# Patient Record
Sex: Male | Born: 1950
Health system: Southern US, Community
[De-identification: ages and names within clinical notes are randomized; demographics above are authoritative.]

## PROBLEM LIST (undated history)

## (undated) DIAGNOSIS — G4730 Sleep apnea, unspecified: Secondary | ICD-10-CM

## (undated) DIAGNOSIS — K602 Anal fissure, unspecified: Secondary | ICD-10-CM

## (undated) DIAGNOSIS — I714 Abdominal aortic aneurysm, without rupture, unspecified: Secondary | ICD-10-CM

## (undated) DIAGNOSIS — M1990 Unspecified osteoarthritis, unspecified site: Secondary | ICD-10-CM

## (undated) DIAGNOSIS — I712 Thoracic aortic aneurysm, without rupture: Secondary | ICD-10-CM

## (undated) DIAGNOSIS — G709 Myoneural disorder, unspecified: Secondary | ICD-10-CM

## (undated) DIAGNOSIS — I7121 Aneurysm of the ascending aorta, without rupture: Secondary | ICD-10-CM

## (undated) DIAGNOSIS — R51 Headache: Secondary | ICD-10-CM

## (undated) DIAGNOSIS — T7840XA Allergy, unspecified, initial encounter: Secondary | ICD-10-CM

## (undated) DIAGNOSIS — I719 Aortic aneurysm of unspecified site, without rupture: Secondary | ICD-10-CM

## (undated) DIAGNOSIS — I10 Essential (primary) hypertension: Secondary | ICD-10-CM

## (undated) DIAGNOSIS — E785 Hyperlipidemia, unspecified: Secondary | ICD-10-CM

## (undated) DIAGNOSIS — S129XXA Fracture of neck, unspecified, initial encounter: Secondary | ICD-10-CM

## (undated) DIAGNOSIS — K649 Unspecified hemorrhoids: Secondary | ICD-10-CM

## (undated) DIAGNOSIS — R55 Syncope and collapse: Secondary | ICD-10-CM

## (undated) DIAGNOSIS — I219 Acute myocardial infarction, unspecified: Secondary | ICD-10-CM

## (undated) DIAGNOSIS — I255 Ischemic cardiomyopathy: Secondary | ICD-10-CM

## (undated) DIAGNOSIS — M549 Dorsalgia, unspecified: Secondary | ICD-10-CM

## (undated) DIAGNOSIS — D126 Benign neoplasm of colon, unspecified: Secondary | ICD-10-CM

## (undated) DIAGNOSIS — G4733 Obstructive sleep apnea (adult) (pediatric): Secondary | ICD-10-CM

## (undated) DIAGNOSIS — I2510 Atherosclerotic heart disease of native coronary artery without angina pectoris: Secondary | ICD-10-CM

## (undated) DIAGNOSIS — I209 Angina pectoris, unspecified: Secondary | ICD-10-CM

## (undated) DIAGNOSIS — Z9289 Personal history of other medical treatment: Secondary | ICD-10-CM

## (undated) DIAGNOSIS — I739 Peripheral vascular disease, unspecified: Secondary | ICD-10-CM

## (undated) DIAGNOSIS — G4700 Insomnia, unspecified: Secondary | ICD-10-CM

## (undated) DIAGNOSIS — K219 Gastro-esophageal reflux disease without esophagitis: Secondary | ICD-10-CM

## (undated) HISTORY — PX: OTHER SURGICAL HISTORY: SHX169

## (undated) HISTORY — DX: Benign neoplasm of colon, unspecified: D12.6

## (undated) HISTORY — DX: Anal fissure, unspecified: K60.2

## (undated) HISTORY — PX: FRACTURE SURGERY: SHX138

## (undated) HISTORY — PX: CERVICAL LAMINECTOMY: SHX94

## (undated) HISTORY — DX: Hyperlipidemia, unspecified: E78.5

## (undated) HISTORY — PX: HIATAL HERNIA REPAIR: SHX195

## (undated) HISTORY — DX: Syncope and collapse: R55

## (undated) HISTORY — DX: Aneurysm of the ascending aorta, without rupture: I71.21

## (undated) HISTORY — DX: Atherosclerotic heart disease of native coronary artery without angina pectoris: I25.10

## (undated) HISTORY — PX: POLYPECTOMY: SHX149

## (undated) HISTORY — PX: BACK SURGERY: SHX140

## (undated) HISTORY — DX: Unspecified systolic (congestive) heart failure: I50.20

## (undated) HISTORY — DX: Obstructive sleep apnea (adult) (pediatric): G47.33

## (undated) HISTORY — PX: COLONOSCOPY: SHX174

## (undated) HISTORY — DX: Peripheral vascular disease, unspecified: I73.9

## (undated) HISTORY — DX: Personal history of other medical treatment: Z92.89

## (undated) HISTORY — DX: Abdominal aortic aneurysm, without rupture, unspecified: I71.40

## (undated) HISTORY — DX: Essential (primary) hypertension: I10

## (undated) HISTORY — DX: Allergy, unspecified, initial encounter: T78.40XA

## (undated) HISTORY — DX: Fracture of neck, unspecified, initial encounter: S12.9XXA

## (undated) HISTORY — PX: CORONARY ARTERY BYPASS GRAFT: SHX141

## (undated) HISTORY — DX: Thoracic aortic aneurysm, without rupture: I71.2

## (undated) HISTORY — DX: Unspecified hemorrhoids: K64.9

## (undated) HISTORY — DX: Ischemic cardiomyopathy: I25.5

## (undated) HISTORY — DX: Gastro-esophageal reflux disease without esophagitis: K21.9

## (undated) HISTORY — DX: Abdominal aortic aneurysm, without rupture: I71.4

---

## 2000-01-06 ENCOUNTER — Ambulatory Visit (HOSPITAL_COMMUNITY): Admission: RE | Admit: 2000-01-06 | Discharge: 2000-01-06 | Payer: Self-pay | Admitting: Neurosurgery

## 2000-01-06 ENCOUNTER — Encounter: Payer: Self-pay | Admitting: Neurosurgery

## 2000-01-07 ENCOUNTER — Encounter: Payer: Self-pay | Admitting: Neurosurgery

## 2000-01-07 ENCOUNTER — Ambulatory Visit (HOSPITAL_COMMUNITY): Admission: RE | Admit: 2000-01-07 | Discharge: 2000-01-07 | Payer: Self-pay | Admitting: Neurosurgery

## 2000-02-06 ENCOUNTER — Encounter: Payer: Self-pay | Admitting: Neurosurgery

## 2000-02-10 ENCOUNTER — Observation Stay (HOSPITAL_COMMUNITY): Admission: RE | Admit: 2000-02-10 | Discharge: 2000-02-10 | Payer: Self-pay | Admitting: Neurosurgery

## 2000-02-10 ENCOUNTER — Encounter: Payer: Self-pay | Admitting: Neurosurgery

## 2000-04-01 ENCOUNTER — Encounter: Payer: Self-pay | Admitting: Neurosurgery

## 2000-04-01 ENCOUNTER — Ambulatory Visit (HOSPITAL_COMMUNITY): Admission: RE | Admit: 2000-04-01 | Discharge: 2000-04-01 | Payer: Self-pay | Admitting: Neurosurgery

## 2000-12-14 DIAGNOSIS — I219 Acute myocardial infarction, unspecified: Secondary | ICD-10-CM

## 2000-12-14 HISTORY — DX: Acute myocardial infarction, unspecified: I21.9

## 2001-03-21 ENCOUNTER — Encounter (INDEPENDENT_AMBULATORY_CARE_PROVIDER_SITE_OTHER): Payer: Self-pay

## 2001-03-21 ENCOUNTER — Encounter: Payer: Self-pay | Admitting: Internal Medicine

## 2001-03-21 ENCOUNTER — Other Ambulatory Visit: Admission: RE | Admit: 2001-03-21 | Discharge: 2001-03-21 | Payer: Self-pay | Admitting: Internal Medicine

## 2001-03-28 ENCOUNTER — Encounter: Payer: Self-pay | Admitting: Internal Medicine

## 2001-03-28 ENCOUNTER — Ambulatory Visit (HOSPITAL_COMMUNITY): Admission: RE | Admit: 2001-03-28 | Discharge: 2001-03-28 | Payer: Self-pay | Admitting: Internal Medicine

## 2001-04-18 ENCOUNTER — Encounter (INDEPENDENT_AMBULATORY_CARE_PROVIDER_SITE_OTHER): Payer: Self-pay | Admitting: *Deleted

## 2001-04-18 ENCOUNTER — Encounter: Payer: Self-pay | Admitting: Surgery

## 2001-04-18 ENCOUNTER — Ambulatory Visit (HOSPITAL_COMMUNITY): Admission: RE | Admit: 2001-04-18 | Discharge: 2001-04-18 | Payer: Self-pay | Admitting: Surgery

## 2001-04-22 ENCOUNTER — Encounter (INDEPENDENT_AMBULATORY_CARE_PROVIDER_SITE_OTHER): Payer: Self-pay | Admitting: *Deleted

## 2001-04-22 ENCOUNTER — Inpatient Hospital Stay (HOSPITAL_COMMUNITY): Admission: RE | Admit: 2001-04-22 | Discharge: 2001-04-24 | Payer: Self-pay | Admitting: Surgery

## 2001-05-12 ENCOUNTER — Encounter: Payer: Self-pay | Admitting: Neurosurgery

## 2001-05-12 ENCOUNTER — Encounter: Admission: RE | Admit: 2001-05-12 | Discharge: 2001-05-12 | Payer: Self-pay | Admitting: Neurosurgery

## 2001-05-25 ENCOUNTER — Encounter: Payer: Self-pay | Admitting: Neurosurgery

## 2001-05-25 ENCOUNTER — Encounter: Admission: RE | Admit: 2001-05-25 | Discharge: 2001-05-25 | Payer: Self-pay | Admitting: Neurosurgery

## 2001-09-17 ENCOUNTER — Encounter: Payer: Self-pay | Admitting: Emergency Medicine

## 2001-09-17 ENCOUNTER — Inpatient Hospital Stay (HOSPITAL_COMMUNITY): Admission: EM | Admit: 2001-09-17 | Discharge: 2001-09-24 | Payer: Self-pay | Admitting: Emergency Medicine

## 2001-09-18 ENCOUNTER — Encounter: Payer: Self-pay | Admitting: Cardiothoracic Surgery

## 2001-09-18 ENCOUNTER — Encounter: Payer: Self-pay | Admitting: Cardiology

## 2001-09-19 ENCOUNTER — Encounter: Payer: Self-pay | Admitting: Cardiothoracic Surgery

## 2001-09-20 ENCOUNTER — Encounter: Payer: Self-pay | Admitting: Cardiothoracic Surgery

## 2001-09-21 ENCOUNTER — Encounter: Payer: Self-pay | Admitting: Cardiothoracic Surgery

## 2001-09-25 ENCOUNTER — Observation Stay (HOSPITAL_COMMUNITY): Admission: EM | Admit: 2001-09-25 | Discharge: 2001-09-27 | Payer: Self-pay | Admitting: Emergency Medicine

## 2001-09-25 ENCOUNTER — Encounter: Payer: Self-pay | Admitting: Emergency Medicine

## 2001-09-27 ENCOUNTER — Encounter: Payer: Self-pay | Admitting: Cardiovascular Disease

## 2001-11-01 ENCOUNTER — Encounter (HOSPITAL_COMMUNITY): Admission: RE | Admit: 2001-11-01 | Discharge: 2002-01-30 | Payer: Self-pay | Admitting: Cardiology

## 2001-12-14 HISTORY — PX: OTHER SURGICAL HISTORY: SHX169

## 2003-03-24 ENCOUNTER — Emergency Department (HOSPITAL_COMMUNITY): Admission: EM | Admit: 2003-03-24 | Discharge: 2003-03-25 | Payer: Self-pay | Admitting: Emergency Medicine

## 2005-03-09 ENCOUNTER — Ambulatory Visit: Payer: Self-pay | Admitting: Family Medicine

## 2005-03-16 ENCOUNTER — Ambulatory Visit: Payer: Self-pay | Admitting: Family Medicine

## 2005-10-09 ENCOUNTER — Ambulatory Visit: Payer: Self-pay | Admitting: Family Medicine

## 2006-01-18 ENCOUNTER — Ambulatory Visit: Payer: Self-pay | Admitting: Family Medicine

## 2006-02-08 ENCOUNTER — Ambulatory Visit: Payer: Self-pay | Admitting: Family Medicine

## 2006-02-14 ENCOUNTER — Encounter: Admission: RE | Admit: 2006-02-14 | Discharge: 2006-02-14 | Payer: Self-pay | Admitting: Family Medicine

## 2006-03-31 ENCOUNTER — Ambulatory Visit: Payer: Self-pay | Admitting: Cardiology

## 2006-04-01 ENCOUNTER — Ambulatory Visit: Payer: Self-pay | Admitting: Cardiology

## 2006-04-06 ENCOUNTER — Ambulatory Visit (HOSPITAL_COMMUNITY): Admission: RE | Admit: 2006-04-06 | Discharge: 2006-04-07 | Payer: Self-pay | Admitting: Neurosurgery

## 2006-06-09 ENCOUNTER — Encounter: Admission: RE | Admit: 2006-06-09 | Discharge: 2006-06-09 | Payer: Self-pay | Admitting: Neurosurgery

## 2006-06-25 ENCOUNTER — Emergency Department (HOSPITAL_COMMUNITY): Admission: EM | Admit: 2006-06-25 | Discharge: 2006-06-25 | Payer: Self-pay | Admitting: Emergency Medicine

## 2006-09-24 ENCOUNTER — Ambulatory Visit: Payer: Self-pay | Admitting: Cardiology

## 2006-09-28 ENCOUNTER — Ambulatory Visit: Payer: Self-pay

## 2006-09-30 ENCOUNTER — Ambulatory Visit: Payer: Self-pay | Admitting: Internal Medicine

## 2006-10-05 ENCOUNTER — Ambulatory Visit: Payer: Self-pay | Admitting: Cardiology

## 2006-10-08 ENCOUNTER — Inpatient Hospital Stay (HOSPITAL_BASED_OUTPATIENT_CLINIC_OR_DEPARTMENT_OTHER): Admission: RE | Admit: 2006-10-08 | Discharge: 2006-10-08 | Payer: Self-pay | Admitting: Cardiovascular Disease

## 2006-10-08 ENCOUNTER — Ambulatory Visit: Payer: Self-pay | Admitting: Cardiovascular Disease

## 2006-11-03 ENCOUNTER — Ambulatory Visit: Payer: Self-pay | Admitting: Cardiology

## 2006-11-15 ENCOUNTER — Ambulatory Visit: Payer: Self-pay | Admitting: Cardiology

## 2006-11-22 ENCOUNTER — Ambulatory Visit: Payer: Self-pay | Admitting: Family Medicine

## 2006-11-29 ENCOUNTER — Ambulatory Visit: Payer: Self-pay

## 2007-01-21 ENCOUNTER — Ambulatory Visit: Payer: Self-pay | Admitting: Family Medicine

## 2007-02-15 ENCOUNTER — Ambulatory Visit: Payer: Self-pay | Admitting: Cardiology

## 2007-02-18 ENCOUNTER — Ambulatory Visit: Payer: Self-pay | Admitting: Family Medicine

## 2007-02-18 LAB — CONVERTED CEMR LAB
BUN: 15 mg/dL (ref 6–23)
Basophils Absolute: 0 10*3/uL (ref 0.0–0.1)
Basophils Relative: 0.6 % (ref 0.0–1.0)
CO2: 29 meq/L (ref 19–32)
Calcium: 9.5 mg/dL (ref 8.4–10.5)
Chloride: 101 meq/L (ref 96–112)
Creatinine, Ser: 0.7 mg/dL (ref 0.4–1.5)
Eosinophils Absolute: 0.1 10*3/uL (ref 0.0–0.6)
Eosinophils Relative: 2 % (ref 0.0–5.0)
GFR calc Af Amer: 151 mL/min
GFR calc non Af Amer: 124 mL/min
Glucose, Bld: 104 mg/dL — ABNORMAL HIGH (ref 70–99)
HCT: 42.7 % (ref 39.0–52.0)
Hemoglobin: 14.9 g/dL (ref 13.0–17.0)
Lymphocytes Relative: 36.1 % (ref 12.0–46.0)
MCHC: 34.9 g/dL (ref 30.0–36.0)
MCV: 93 fL (ref 78.0–100.0)
Monocytes Absolute: 0.3 10*3/uL (ref 0.2–0.7)
Monocytes Relative: 8.4 % (ref 3.0–11.0)
Neutro Abs: 2.1 10*3/uL (ref 1.4–7.7)
Neutrophils Relative %: 52.9 % (ref 43.0–77.0)
Platelets: 185 10*3/uL (ref 150–400)
Potassium: 4.2 meq/L (ref 3.5–5.1)
RBC: 4.59 M/uL (ref 4.22–5.81)
RDW: 12.1 % (ref 11.5–14.6)
Sodium: 136 meq/L (ref 135–145)
WBC: 3.9 10*3/uL — ABNORMAL LOW (ref 4.5–10.5)

## 2007-02-28 ENCOUNTER — Encounter: Payer: Self-pay | Admitting: Internal Medicine

## 2007-06-13 ENCOUNTER — Ambulatory Visit: Payer: Self-pay | Admitting: Cardiology

## 2007-07-04 ENCOUNTER — Encounter: Payer: Self-pay | Admitting: Family Medicine

## 2007-07-14 ENCOUNTER — Telehealth (INDEPENDENT_AMBULATORY_CARE_PROVIDER_SITE_OTHER): Payer: Self-pay | Admitting: *Deleted

## 2007-07-14 ENCOUNTER — Encounter (INDEPENDENT_AMBULATORY_CARE_PROVIDER_SITE_OTHER): Payer: Self-pay | Admitting: *Deleted

## 2007-07-14 LAB — CONVERTED CEMR LAB
Bilirubin Urine: NEGATIVE
Blood in Urine, dipstick: NEGATIVE
Glucose, Urine, Semiquant: NEGATIVE
Ketones, urine, test strip: NEGATIVE
Nitrite: NEGATIVE
Protein, U semiquant: NEGATIVE
Urobilinogen, UA: NEGATIVE
WBC Urine, dipstick: NEGATIVE

## 2007-07-15 ENCOUNTER — Ambulatory Visit: Payer: Self-pay | Admitting: Family Medicine

## 2007-07-15 DIAGNOSIS — H60399 Other infective otitis externa, unspecified ear: Secondary | ICD-10-CM | POA: Insufficient documentation

## 2007-08-01 ENCOUNTER — Ambulatory Visit: Payer: Self-pay | Admitting: Family Medicine

## 2007-08-01 ENCOUNTER — Encounter: Admission: RE | Admit: 2007-08-01 | Discharge: 2007-08-01 | Payer: Self-pay | Admitting: Family Medicine

## 2007-08-01 DIAGNOSIS — R55 Syncope and collapse: Secondary | ICD-10-CM | POA: Insufficient documentation

## 2007-08-01 DIAGNOSIS — E785 Hyperlipidemia, unspecified: Secondary | ICD-10-CM | POA: Insufficient documentation

## 2007-08-01 DIAGNOSIS — M542 Cervicalgia: Secondary | ICD-10-CM | POA: Insufficient documentation

## 2007-08-05 ENCOUNTER — Encounter: Payer: Self-pay | Admitting: Family Medicine

## 2007-08-05 ENCOUNTER — Ambulatory Visit: Payer: Self-pay

## 2007-08-09 ENCOUNTER — Encounter (INDEPENDENT_AMBULATORY_CARE_PROVIDER_SITE_OTHER): Payer: Self-pay | Admitting: *Deleted

## 2007-08-17 ENCOUNTER — Ambulatory Visit: Payer: Self-pay | Admitting: Family Medicine

## 2007-08-17 DIAGNOSIS — I10 Essential (primary) hypertension: Secondary | ICD-10-CM | POA: Insufficient documentation

## 2007-08-17 DIAGNOSIS — G4700 Insomnia, unspecified: Secondary | ICD-10-CM | POA: Insufficient documentation

## 2007-09-07 ENCOUNTER — Telehealth: Payer: Self-pay | Admitting: Family Medicine

## 2007-09-26 ENCOUNTER — Ambulatory Visit (HOSPITAL_COMMUNITY): Admission: RE | Admit: 2007-09-26 | Discharge: 2007-09-26 | Payer: Self-pay | Admitting: Urology

## 2007-12-16 ENCOUNTER — Ambulatory Visit: Payer: Self-pay | Admitting: Cardiology

## 2007-12-23 ENCOUNTER — Ambulatory Visit: Payer: Self-pay

## 2007-12-23 ENCOUNTER — Ambulatory Visit: Payer: Self-pay | Admitting: Cardiology

## 2007-12-23 LAB — CONVERTED CEMR LAB
ALT: 28 units/L (ref 0–53)
AST: 26 units/L (ref 0–37)
Albumin: 4.2 g/dL (ref 3.5–5.2)
Alkaline Phosphatase: 55 units/L (ref 39–117)
Bilirubin, Direct: 0.1 mg/dL (ref 0.0–0.3)
Cholesterol: 155 mg/dL (ref 0–200)
HDL: 35.4 mg/dL — ABNORMAL LOW (ref >39.0)
LDL Cholesterol: 96 mg/dL (ref 0–99)
Total Bilirubin: 1 mg/dL (ref 0.3–1.2)
Total CHOL/HDL Ratio: 4.4
Total Protein: 7.1 g/dL (ref 6.0–8.3)
Triglycerides: 117 mg/dL (ref 0–149)
VLDL: 23 mg/dL (ref 0–40)

## 2008-04-19 ENCOUNTER — Telehealth (INDEPENDENT_AMBULATORY_CARE_PROVIDER_SITE_OTHER): Payer: Self-pay | Admitting: *Deleted

## 2008-04-24 ENCOUNTER — Encounter: Payer: Self-pay | Admitting: Family Medicine

## 2008-05-08 ENCOUNTER — Ambulatory Visit: Payer: Self-pay | Admitting: Cardiology

## 2008-05-10 ENCOUNTER — Ambulatory Visit: Payer: Self-pay | Admitting: Internal Medicine

## 2008-05-10 ENCOUNTER — Ambulatory Visit: Payer: Self-pay

## 2008-05-10 ENCOUNTER — Encounter: Payer: Self-pay | Admitting: Cardiology

## 2008-05-10 ENCOUNTER — Ambulatory Visit: Payer: Self-pay | Admitting: Cardiology

## 2008-05-14 ENCOUNTER — Encounter: Payer: Self-pay | Admitting: Family Medicine

## 2008-05-14 ENCOUNTER — Ambulatory Visit: Payer: Self-pay

## 2008-08-07 ENCOUNTER — Ambulatory Visit: Payer: Self-pay | Admitting: Cardiology

## 2008-08-07 LAB — CONVERTED CEMR LAB
BUN: 12 mg/dL (ref 6–23)
CO2: 29 meq/L (ref 19–32)
Calcium: 9.2 mg/dL (ref 8.4–10.5)
Chloride: 107 meq/L (ref 96–112)
Creatinine, Ser: 0.9 mg/dL (ref 0.4–1.5)
GFR calc Af Amer: 112 mL/min
GFR calc non Af Amer: 93 mL/min
Glucose, Bld: 101 mg/dL — ABNORMAL HIGH (ref 70–99)
Potassium: 4.1 meq/L (ref 3.5–5.1)
Sodium: 140 meq/L (ref 135–145)

## 2008-08-29 ENCOUNTER — Ambulatory Visit: Payer: Self-pay | Admitting: Family Medicine

## 2008-08-29 ENCOUNTER — Encounter (INDEPENDENT_AMBULATORY_CARE_PROVIDER_SITE_OTHER): Payer: Self-pay | Admitting: *Deleted

## 2008-08-29 DIAGNOSIS — R079 Chest pain, unspecified: Secondary | ICD-10-CM | POA: Insufficient documentation

## 2008-08-29 LAB — CONVERTED CEMR LAB
Bilirubin Urine: NEGATIVE
Glucose, Urine, Semiquant: NEGATIVE
Ketones, urine, test strip: NEGATIVE
Nitrite: NEGATIVE
Protein, U semiquant: NEGATIVE
Specific Gravity, Urine: <1.005
Urobilinogen, UA: 0.2
WBC Urine, dipstick: NEGATIVE
pH: 7

## 2008-09-18 ENCOUNTER — Telehealth (INDEPENDENT_AMBULATORY_CARE_PROVIDER_SITE_OTHER): Payer: Self-pay | Admitting: *Deleted

## 2009-02-04 ENCOUNTER — Ambulatory Visit: Payer: Self-pay | Admitting: Cardiology

## 2009-02-11 ENCOUNTER — Ambulatory Visit: Payer: Self-pay | Admitting: Family Medicine

## 2009-02-19 ENCOUNTER — Telehealth (INDEPENDENT_AMBULATORY_CARE_PROVIDER_SITE_OTHER): Payer: Self-pay | Admitting: *Deleted

## 2009-02-21 ENCOUNTER — Encounter: Payer: Self-pay | Admitting: Family Medicine

## 2009-02-21 ENCOUNTER — Telehealth (INDEPENDENT_AMBULATORY_CARE_PROVIDER_SITE_OTHER): Payer: Self-pay | Admitting: *Deleted

## 2009-03-21 LAB — CONVERTED CEMR LAB
ALT: 22 units/L (ref 0–53)
AST: 20 units/L (ref 0–37)
Albumin: 4.2 g/dL (ref 3.5–5.2)
Alkaline Phosphatase: 56 units/L (ref 39–117)
BUN: 14 mg/dL (ref 6–23)
Bilirubin, Direct: 0.1 mg/dL (ref 0.0–0.3)
CO2: 31 meq/L (ref 19–32)
Calcium: 9.4 mg/dL (ref 8.4–10.5)
Chloride: 104 meq/L (ref 96–112)
Cholesterol: 198 mg/dL (ref 0–200)
Creatinine, Ser: 0.9 mg/dL (ref 0.4–1.5)
GFR calc Af Amer: 112 mL/min
GFR calc non Af Amer: 92 mL/min
Glucose, Bld: 98 mg/dL (ref 70–99)
HDL: 38.7 mg/dL — ABNORMAL LOW (ref >39.0)
LDL Cholesterol: 123 mg/dL — ABNORMAL HIGH (ref 0–99)
Potassium: 4.4 meq/L (ref 3.5–5.1)
Sodium: 141 meq/L (ref 135–145)
Total Bilirubin: 1 mg/dL (ref 0.3–1.2)
Total CHOL/HDL Ratio: 5.1
Total Protein: 6.9 g/dL (ref 6.0–8.3)
Triglycerides: 180 mg/dL — ABNORMAL HIGH (ref 0–149)
VLDL: 36 mg/dL (ref 0–40)

## 2009-04-08 ENCOUNTER — Ambulatory Visit: Payer: Self-pay | Admitting: Family Medicine

## 2009-04-08 DIAGNOSIS — Z87898 Personal history of other specified conditions: Secondary | ICD-10-CM | POA: Insufficient documentation

## 2009-04-10 ENCOUNTER — Encounter: Payer: Self-pay | Admitting: Family Medicine

## 2009-04-11 ENCOUNTER — Encounter: Payer: Self-pay | Admitting: Family Medicine

## 2009-04-23 ENCOUNTER — Encounter: Admission: RE | Admit: 2009-04-23 | Discharge: 2009-04-23 | Payer: Self-pay | Admitting: Neurosurgery

## 2009-07-08 ENCOUNTER — Encounter: Payer: Self-pay | Admitting: Family Medicine

## 2009-08-07 ENCOUNTER — Encounter: Payer: Self-pay | Admitting: Family Medicine

## 2009-12-27 ENCOUNTER — Encounter: Payer: Self-pay | Admitting: Cardiology

## 2009-12-27 DIAGNOSIS — I714 Abdominal aortic aneurysm, without rupture, unspecified: Secondary | ICD-10-CM

## 2009-12-27 HISTORY — DX: Abdominal aortic aneurysm, without rupture, unspecified: I71.40

## 2009-12-30 ENCOUNTER — Encounter: Payer: Self-pay | Admitting: Cardiology

## 2009-12-30 ENCOUNTER — Ambulatory Visit: Payer: Self-pay

## 2010-01-07 ENCOUNTER — Emergency Department (HOSPITAL_BASED_OUTPATIENT_CLINIC_OR_DEPARTMENT_OTHER): Admission: EM | Admit: 2010-01-07 | Discharge: 2010-01-07 | Payer: Self-pay | Admitting: Emergency Medicine

## 2010-01-23 ENCOUNTER — Ambulatory Visit: Payer: Self-pay | Admitting: Family Medicine

## 2010-01-23 DIAGNOSIS — R197 Diarrhea, unspecified: Secondary | ICD-10-CM | POA: Insufficient documentation

## 2010-01-23 DIAGNOSIS — R509 Fever, unspecified: Secondary | ICD-10-CM | POA: Insufficient documentation

## 2010-01-27 LAB — CONVERTED CEMR LAB
ALT: 22 units/L (ref 0–53)
AST: 24 units/L (ref 0–37)
Albumin: 4.4 g/dL (ref 3.5–5.2)
Alkaline Phosphatase: 55 units/L (ref 39–117)
BUN: 13 mg/dL (ref 6–23)
Basophils Absolute: 0 10*3/uL (ref 0.0–0.1)
Basophils Relative: 0.7 % (ref 0.0–3.0)
Bilirubin, Direct: 0.1 mg/dL (ref 0.0–0.3)
CO2: 25 meq/L (ref 19–32)
Calcium: 9.3 mg/dL (ref 8.4–10.5)
Chloride: 108 meq/L (ref 96–112)
Creatinine, Ser: 0.9 mg/dL (ref 0.4–1.5)
Eosinophils Absolute: 0.1 10*3/uL (ref 0.0–0.7)
Eosinophils Relative: 2.3 % (ref 0.0–5.0)
GFR calc non Af Amer: 92.13 mL/min (ref >60)
Glucose, Bld: 94 mg/dL (ref 70–99)
HCT: 39.4 % (ref 39.0–52.0)
Hemoglobin: 13.8 g/dL (ref 13.0–17.0)
Lymphocytes Relative: 27.5 % (ref 12.0–46.0)
Lymphs Abs: 1.2 10*3/uL (ref 0.7–4.0)
MCHC: 34.9 g/dL (ref 30.0–36.0)
MCV: 93.9 fL (ref 78.0–100.0)
Monocytes Absolute: 0.3 10*3/uL (ref 0.1–1.0)
Monocytes Relative: 6.7 % (ref 3.0–12.0)
Neutro Abs: 2.7 10*3/uL (ref 1.4–7.7)
Neutrophils Relative %: 62.8 % (ref 43.0–77.0)
Platelets: 157 10*3/uL (ref 150.0–400.0)
Potassium: 4.2 meq/L (ref 3.5–5.1)
RBC: 4.2 M/uL — ABNORMAL LOW (ref 4.22–5.81)
RDW: 12.2 % (ref 11.5–14.6)
Sodium: 139 meq/L (ref 135–145)
TSH: 1.06 microintl units/mL (ref 0.35–5.50)
Total Bilirubin: 0.6 mg/dL (ref 0.3–1.2)
Total Protein: 6.9 g/dL (ref 6.0–8.3)
WBC: 4.3 10*3/uL — ABNORMAL LOW (ref 4.5–10.5)

## 2010-02-11 ENCOUNTER — Encounter: Payer: Self-pay | Admitting: Family Medicine

## 2010-02-12 ENCOUNTER — Telehealth: Payer: Self-pay | Admitting: Internal Medicine

## 2010-02-12 ENCOUNTER — Telehealth (INDEPENDENT_AMBULATORY_CARE_PROVIDER_SITE_OTHER): Payer: Self-pay | Admitting: *Deleted

## 2010-02-14 ENCOUNTER — Ambulatory Visit: Payer: Self-pay | Admitting: Gastroenterology

## 2010-02-14 DIAGNOSIS — Z860100 Personal history of colon polyps, unspecified: Secondary | ICD-10-CM | POA: Insufficient documentation

## 2010-02-14 DIAGNOSIS — R197 Diarrhea, unspecified: Secondary | ICD-10-CM | POA: Insufficient documentation

## 2010-03-05 DIAGNOSIS — I2510 Atherosclerotic heart disease of native coronary artery without angina pectoris: Secondary | ICD-10-CM | POA: Insufficient documentation

## 2010-03-05 DIAGNOSIS — K219 Gastro-esophageal reflux disease without esophagitis: Secondary | ICD-10-CM | POA: Insufficient documentation

## 2010-03-10 ENCOUNTER — Ambulatory Visit: Payer: Self-pay | Admitting: Cardiology

## 2010-03-18 ENCOUNTER — Telehealth: Payer: Self-pay | Admitting: Internal Medicine

## 2010-03-18 ENCOUNTER — Telehealth (INDEPENDENT_AMBULATORY_CARE_PROVIDER_SITE_OTHER): Payer: Self-pay | Admitting: *Deleted

## 2010-03-24 ENCOUNTER — Encounter: Payer: Self-pay | Admitting: Family Medicine

## 2010-04-09 ENCOUNTER — Ambulatory Visit: Payer: Self-pay | Admitting: Internal Medicine

## 2010-04-22 ENCOUNTER — Telehealth: Payer: Self-pay | Admitting: Internal Medicine

## 2010-04-23 ENCOUNTER — Encounter: Payer: Self-pay | Admitting: Nurse Practitioner

## 2010-04-23 ENCOUNTER — Ambulatory Visit: Payer: Self-pay | Admitting: Gastroenterology

## 2010-04-23 DIAGNOSIS — R259 Unspecified abnormal involuntary movements: Secondary | ICD-10-CM | POA: Insufficient documentation

## 2010-04-23 LAB — CONVERTED CEMR LAB
IgA: 249 mg/dL (ref 68–378)
Tissue Transglutaminase Ab, IgA: 3.1 units (ref <20)

## 2010-04-24 ENCOUNTER — Telehealth (INDEPENDENT_AMBULATORY_CARE_PROVIDER_SITE_OTHER): Payer: Self-pay | Admitting: *Deleted

## 2010-04-24 ENCOUNTER — Encounter: Payer: Self-pay | Admitting: Family Medicine

## 2010-05-02 ENCOUNTER — Telehealth: Payer: Self-pay | Admitting: Nurse Practitioner

## 2010-05-19 ENCOUNTER — Ambulatory Visit: Payer: Self-pay | Admitting: Cardiology

## 2010-05-20 ENCOUNTER — Telehealth: Payer: Self-pay | Admitting: Cardiology

## 2010-05-23 ENCOUNTER — Encounter: Payer: Self-pay | Admitting: Cardiology

## 2010-06-04 ENCOUNTER — Encounter: Payer: Self-pay | Admitting: Family Medicine

## 2010-06-05 ENCOUNTER — Ambulatory Visit: Payer: Self-pay | Admitting: Internal Medicine

## 2010-06-05 ENCOUNTER — Inpatient Hospital Stay (HOSPITAL_COMMUNITY): Admission: EM | Admit: 2010-06-05 | Discharge: 2010-06-06 | Payer: Self-pay | Admitting: Emergency Medicine

## 2010-06-05 HISTORY — PX: CARDIAC CATHETERIZATION: SHX172

## 2010-06-09 ENCOUNTER — Encounter: Payer: Self-pay | Admitting: Cardiology

## 2010-06-26 ENCOUNTER — Ambulatory Visit: Payer: Self-pay | Admitting: Family Medicine

## 2010-06-26 DIAGNOSIS — F411 Generalized anxiety disorder: Secondary | ICD-10-CM | POA: Insufficient documentation

## 2010-07-07 ENCOUNTER — Ambulatory Visit: Payer: Self-pay | Admitting: Cardiology

## 2010-07-29 ENCOUNTER — Encounter: Payer: Self-pay | Admitting: Internal Medicine

## 2010-09-03 ENCOUNTER — Telehealth: Payer: Self-pay | Admitting: Cardiology

## 2010-09-10 ENCOUNTER — Ambulatory Visit: Payer: Self-pay | Admitting: Cardiology

## 2010-09-18 ENCOUNTER — Telehealth: Payer: Self-pay | Admitting: Cardiology

## 2010-09-18 ENCOUNTER — Telehealth: Payer: Self-pay | Admitting: Family Medicine

## 2010-09-19 ENCOUNTER — Ambulatory Visit: Payer: Self-pay | Admitting: Family Medicine

## 2010-09-19 ENCOUNTER — Encounter: Payer: Self-pay | Admitting: Family Medicine

## 2010-11-05 ENCOUNTER — Encounter: Payer: Self-pay | Admitting: Family Medicine

## 2010-11-10 ENCOUNTER — Encounter: Payer: Self-pay | Admitting: Family Medicine

## 2010-12-19 ENCOUNTER — Ambulatory Visit
Admission: RE | Admit: 2010-12-19 | Discharge: 2010-12-19 | Payer: Self-pay | Source: Home / Self Care | Attending: Family Medicine | Admitting: Family Medicine

## 2010-12-19 ENCOUNTER — Encounter: Payer: Self-pay | Admitting: Family Medicine

## 2011-01-11 LAB — CONVERTED CEMR LAB
BUN: 17 mg/dL (ref 6–23)
CO2: 30 meq/L (ref 19–32)
Calcium: 8.9 mg/dL (ref 8.4–10.5)
Chloride: 104 meq/L (ref 96–112)
Cholesterol: 155 mg/dL (ref 0–200)
Cholesterol: 206 mg/dL — ABNORMAL HIGH (ref 0–200)
Creatinine, Ser: 0.9 mg/dL (ref 0.4–1.5)
Direct LDL: 109.6 mg/dL
Direct LDL: 78.6 mg/dL
GFR calc non Af Amer: 94.44 mL/min (ref >60)
Glucose, Bld: 108 mg/dL — ABNORMAL HIGH (ref 70–99)
HDL: 35.1 mg/dL — ABNORMAL LOW (ref >39.00)
HDL: 42.6 mg/dL (ref >39.00)
Potassium: 4.3 meq/L (ref 3.5–5.1)
Sodium: 140 meq/L (ref 135–145)
Total CHOL/HDL Ratio: 4
Total CHOL/HDL Ratio: 5
Triglycerides: 291 mg/dL — ABNORMAL HIGH (ref 0.0–149.0)
Triglycerides: 291 mg/dL — ABNORMAL HIGH (ref 0.0–149.0)
VLDL: 58.2 mg/dL — ABNORMAL HIGH (ref 0.0–40.0)
VLDL: 58.2 mg/dL — ABNORMAL HIGH (ref 0.0–40.0)

## 2011-01-15 NOTE — Miscellaneous (Signed)
Summary: Orders Update---celebrex RX  Medications Added CELEBREX 200 MG  CAPS (CELECOXIB) 1 by mouth once daily       Clinical Lists Changes  Medications: Added new medication of CELEBREX 200 MG  CAPS (CELECOXIB) 1 by mouth once daily - Signed Rx of CELEBREX 200 MG  CAPS (CELECOXIB) 1 by mouth once daily;  #90 x 3;  Signed;  Entered by: Loreen Freud DO;  Authorized by: Loreen Freud DO;  Method used: Print then Give to Patient    Prescriptions: CELEBREX 200 MG  CAPS (CELECOXIB) 1 by mouth once daily  #90 x 3   Entered and Authorized by:   Loreen Freud DO   Signed by:   Loreen Freud DO on 07/04/2007   Method used:   Print then Give to Patient   RxID:   [id number]

## 2011-01-15 NOTE — Letter (Signed)
Summary: [facility]   Imported By: Lanelle Bal 03/31/2010 11:14:09  _____________________________________________________________________  External Attachment:    Type:   Image     Comment:   External Document

## 2011-01-15 NOTE — Letter (Signed)
Summary: [facility]  [facility]   Imported By: Lanelle Bal 07/15/2009 11:45:58  _____________________________________________________________________  External Attachment:    Type:   Image     Comment:   External Document

## 2011-01-15 NOTE — Letter (Signed)
Summary: Results Follow-up Letter  [facility] [place]  [address] [place], [place] [address]   Phone: [phone]  Fax: [phone]    08/29/2008       Jemari Hallum [address] [place], [place]  [address]  Dear Mr. WIDMAYER,   The following are the results of your recent test(s):  Test     Result     Pap Smear    Normal_______  Not Normal_____       Comments: _________________________________________________________ Cholesterol LDL(Bad cholesterol):          Your goal is less than:         HDL (Good cholesterol):        Your goal is more than: _________________________________________________________ Other Tests:   _________________________________________________________  Please call for an appointment Or Please see attached labs._________________________________________________________ _________________________________________________________ _________________________________________________________  Sincerely,  Felecia Deloach CMA [facility]

## 2011-01-15 NOTE — Letter (Signed)
Summary: Work Dietitian at [facility]  [address] [place], [place] [address]   Phone: [phone]  Fax: [phone]    Today's Date: January 23, 2010  Name of Patient: [name]  The above named patient had a medical visit today at:  11:15 am   Please take this into consideration when reviewing the time away from work/school.    Special Instructions:  [ X ] None  [  ] To be off the remainder of today, returning to the normal work / school schedule tomorrow.  [  ] To be off until the next scheduled appointment on ______________________.  [  ] Other ________________________________________________________________ ________________________________________________________________________   Sincerely yours,   Loreen Freud DO  Appended Document: Work Excuse THis note needs to be voided

## 2011-01-15 NOTE — Progress Notes (Signed)
Summary: Xifaxan NOT WORKING   Phone Note Outgoing Call   Call placed to: Patient Summary of Call: LM on home phone , asked per per Gunnar Fusi if the Xifaxan helped him. Initial call taken by: Joselyn Glassman,  May 02, 2010 10:56 AM  Follow-up for Phone Call        The pt said he is still having about 4 BM's of diarrhea daily. He takes Lomotil 1-2 times daily.  He said if the goal of the Xifaxan was to stop the diarrhea it failed.   Follow-up by: Joselyn Glassman,  May 02, 2010 11:15 AM  Additional Follow-up for Phone Call Additional follow up Details #1::        RETREAT WITH MORE PROLONGED COURSE OF METRONIDAZOLE 250MG  QID X ONE MONTH. KEEP F/U W/ ME ON 6-27 Additional Follow-up by: Hilarie Fredrickson MD,  May 02, 2010 12:29 PM    New/Updated Medications: METRONIDAZOLE 250 MG TABS (METRONIDAZOLE) Take 1 tab 4 times daily x 1 month LEVBID 0.375 MG XR12H-TAB (HYOSCYAMINE SULFATE) Take 1 tab twice daily as needed for cramping, spasms Prescriptions: LEVBID 0.375 MG XR12H-TAB (HYOSCYAMINE SULFATE) Take 1 tab twice daily as needed for cramping, spasms  #60 x 0   Entered by:   Lowry Ram NCMA   Authorized by:   Willette Cluster NP   Signed by:   Lowry Ram NCMA on 05/02/2010   Method used:   [facility] to        [facility]  [address] [phone]       [address]       [place], [place]  [address]       Ph: [phone] or [phone]       Fax: [phone]   RxID:   [id number] METRONIDAZOLE 250 MG TABS (METRONIDAZOLE) Take 1 tab 4 times daily x 1 month  #120 x 0   Entered by:   Lowry Ram NCMA   Authorized by:   Hilarie Fredrickson MD   Signed by:   Lowry Ram NCMA on 05/02/2010   Method used:   [facility]        [facility]  [address] [phone][facility] (retail[facility]       [address]       [place], [place]  [address]       Ph: [phone] or [phone]       Fax: [phone]   RxID:   [id number]   Appended Document: Xifaxan NOT  WORKING Spoke to Dr. Marina Goodell and he said the pt can stop the Xifaxan and start to take the Metronidazole 250 mg 1 tab 4 times daily x 1 month.  I did speak to Mrs. Serviss and told her I spoke to Dr. Marina Goodell to let him know that the pt said he does feel the Xifaxan may have helped some.  Dr. Marina Goodell still thinks he should start the Metronidazole. He doesn't feel he needs a probiotic at this time.   I spoke to Bressler and she said I could send a perscription for Levbid 0.375 mg , take twice daily. The pt asked me for this. He said this helped the last time he was on Metronidazole.

## 2011-01-15 NOTE — Progress Notes (Signed)
Summary: [facility]ne---rx CELEBREX 200 MG  CAPS   Phone Note Refill Request   Refills Requested: Medication #1:  CELEBREX 200 MG  CAPS 1 by mouth once daily [facility]--  Initial call taken by: Freddy Jaksch,  February 19, 2009 8:47 AM  Follow-up for Phone Call        last filled 04-17-08 #90 3 refills, last Ov 08-29-08....................Marland KitchenFelecia Deloach CMA  February 19, 2009 3:22 PM  Additional Follow-up for Phone Call Additional follow up Details #1::        rx sent to [facility] Additional Follow-up by: Loreen Freud DO,  February 19, 2009 9:57 PM      Prescriptions: CELEBREX 200 MG  CAPS (CELECOXIB) 1 by mouth once daily  #90 x 3   Entered and Authorized by:   Loreen Freud DO   Signed by:   Loreen Freud DO on 02/19/2009   Method used:   Electronically to        [facility] (mail-order)             ,          Ph: [phone]       Fax: [phone]   RxID:   [id number]

## 2011-01-15 NOTE — Assessment & Plan Note (Signed)
Summary: f68m   Visit Type:  Follow-up Referring Provider:  n/a Primary Provider:  Loreen Freud, DO  CC:  no complaints.  History of Present Illness: He is doing well.  Not having any angina.  Has been off of Celbrex.  Doing well overall.  BP went down after Toprol was increased with the hospital admission.  Current Medications (verified): 1)  Valtrex 500 Mg Tabs (Valacyclovir Hcl) .... Take 1 Tablet By Mouth Twice A Day As Needed 2)  Tramadol Hcl 50 Mg Tabs (Tramadol Hcl) .... Take As Needed Pain 3)  Altace 5 Mg  Caps (Ramipril) .Marland Kitchen.. 1 By Mouth Two Times A Day 4)  Clonazepam 1 Mg  Tabs (Clonazepam) .Marland Kitchen.. 1 1/2 Tab Once Daily 5)  Adult Aspirin Low Strength 81 Mg  Tbdp (Aspirin) .Marland Kitchen.. 1 By Mouth Once Daily 6)  Toprol Xl 25 Mg Xr24h-Tab (Metoprolol Succinate) .Marland Kitchen.. 1 Tab Two Times A Day 7)  Zanaflex 4 Mg Caps (Tizanidine Hcl) .... As Needed 8)  Pravachol 80 Mg  Tabs (Pravastatin Sodium) .Marland Kitchen.. 1 By Mouth At Bedtime 9)  Avodart 0.5 Mg  Caps (Dutasteride) .Marland Kitchen.. 1 By Mouth Once Daily 10)  Vicodin Es 7.5-750 Mg Tabs (Hydrocodone-Acetaminophen) .Marland Kitchen.. 1 By Mouth Every 6 Hours As Needed 11)  Remeron 30 Mg Tabs (Mirtazapine) .... As Needed 12)  Eql Coq10 300 Mg Caps (Coenzyme Q10) .... Once A Day 13)  Tumeric 500mg  Cap .... Once A Day 14)  Omega-3 Krill Oil 300 Mg Caps (Krill Oil) .... 0nce A Day 15)  Nitrostat 0.4 Mg Subl (Nitroglycerin) .Marland Kitchen.. 1 Tablet Under Tongue At Onset of Chest Pain; You May Repeat Every 5 Minutes For Up To 3 Doses.  Allergies (verified): 1)  ! Pcn 2)  ! 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) DIARRHEA-PRESUMED INFECTIOUS (ICD-009.3) GERD (ICD-530.81) PERSONAL HX COLONIC POLYPS (ICD-V12.72) FEVER UNSPECIFIED (ICD-780.60) DIARRHEA (ICD-787.91) SHINGLES, HX OF  (ICD-V13.8) PREVENTIVE HEALTH CARE (ICD-V70.0) ENCOUNTER FOR LONG-TERM USE OF OTHER MEDICATIONS (ICD-V58.69) RIB PAIN, LEFT SIDED (ICD-786.50) INSOMNIA, PERSISTENT (ICD-307.42) NECK PAIN (ICD-723.1) OTITIS EXTERNA, RIGHT (ICD-380.10)  Past Surgical History: Last updated: 06/26/2010 Cervical laminectomy Hiatal Hernia surgery Heart bypass x 6 cardiac cath 06/05/2010  Family History: Last updated: 03/05/2010 Alcohilism-Mother No FH of Colon Cancer:  Social History: Last updated: 03/05/2010 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  Alcohol Use - yes  Daily Caffeine Use Illicit Drug Use - no  Risk Factors: Alcohol Use: 2 (06/26/2010) >5 drinks/d w/in last 3 months: no (06/26/2010) Caffeine Use: 2-3 (06/26/2010) Diet: well balanced (06/26/2010) Exercise: yes (06/26/2010)  Risk Factors: Smoking Status: quit (06/26/2010)  Vital Signs:  Patient profile:   60 year old male Height:      76.5 inches Weight:      193 pounds BMI:     23.27 Pulse rate:   62 / minute BP sitting:   132 / 90  (left arm) Cuff size:   regular  Vitals Entered By: Hardin Negus, RMA (September 10, 2010 9:10 AM)  Physical Exam  General:  Well developed, well nourished, in no acute distress. Head:  normocephalic and atraumatic Eyes:  PERRLA/EOM intact; conjunctiva and lids normal. Neck:  No bruits Lungs:  Clear bilaterally to auscultation and percussion. Heart:  PMI non displaced.  Normal S1 and S2.  S4  No signficant murmur.   Pulses:  pulses normal in all 4 extremities Extremities:  No clubbing or cyanosis. Neurologic:  Alert and oriented x 3.   Impression & Recommendations:  Problem # 1:  CAD, ARTERY BYPASS GRAFT (ICD-414.04) No chest pain.  Doing well.  His updated medication list for this problem includes:    Altace 5 Mg Caps (Ramipril) .Marland Kitchen... 1 by mouth two times a day    Adult Aspirin Low Strength 81 Mg Tbdp (Aspirin) .Marland Kitchen... 1 by mouth once daily     Toprol Xl 25 Mg Xr24h-tab (Metoprolol succinate) .Marland Kitchen... 1 tab two times a day    Nitrostat 0.4 Mg Subl (Nitroglycerin) .Marland Kitchen... 1 tablet under tongue at onset of chest pain; you may repeat every 5 minutes for up to 3 doses.  Problem # 2:  HYPERLIPIDEMIA (ICD-272.4) Tolerating well.   His updated medication list for this problem includes:    Pravachol 80 Mg Tabs (Pravastatin sodium) .Marland Kitchen... 1 by mouth at bedtime  Problem # 3:  HYPERTENSION (ICD-401.9) controlled at present His updated medication list for this problem includes:    Altace 5 Mg Caps (Ramipril) .Marland Kitchen... 1 by mouth two times a day    Adult Aspirin Low Strength 81 Mg Tbdp (Aspirin) .Marland Kitchen... 1 by mouth once daily    Toprol Xl 25 Mg Xr24h-tab (Metoprolol succinate) .Marland Kitchen... 1 tab two times a day  Patient Instructions: 1)  Your physician recommends that you continue on your current medications as directed. Please refer to the Current Medication list given to you today. 2)  Your physician wants you to follow-up in: 6 MONTHS.   You will receive a reminder letter in the mail two months in advance. If you don't receive a letter, please call our office to schedule the follow-up appointment.

## 2011-01-15 NOTE — Progress Notes (Signed)
Summary: Triage   Phone Note Call from Patient Call back at Home Phone [phone]   Caller: Wife  Liborio Nixon Call For: Dr. Marina Goodell Reason for Call: Talk to Nurse, Privacy/Consent Authorization Summary of Call: Pt has had diarrhea x4 mths. "It cleared up some" when he was taken Flagyl. Has an appt. sch'd on 06-09-10 and wants to know what he can do in the meantime Initial call taken by: Karna Christmas,  Apr 22, 2010 1:47 PM  Follow-up for Phone Call        When pt. saw Dr.Madeliene Tejera on 04/09/10 he had just finished  a course of Flagyl and stool was soft and formed.Within a few days he was back to loose stools and he has about 3 a day that are mostly green in color.Does not see any blood or mucus and denies fever.Only other complaint is urgency prior to the stool.Says he never had a problem with diarrhea until he got the noro-virus in Jan. of this year.Sent to Dr.Patterson  the dr. of the day as Dr.Deontre Allsup is out of the office this week. Follow-up by: Teryl Lucy RN,  Apr 22, 2010 2:14 PM  Additional Follow-up for Phone Call Additional follow up Details #1::        see pa.. Additional Follow-up by: Mardella Layman MD FACG,  Apr 22, 2010 2:23 PM    Additional Follow-up for Phone Call Additional follow up Details #2::    Given appt. with N.P. for tomorrow at 8:30. a.m..Pt. aware. Follow-up by: Teryl Lucy RN,  Apr 22, 2010 2:50 PM

## 2011-01-15 NOTE — Assessment & Plan Note (Signed)
Summary: eph   Visit Type:  EPH Referring Provider:  n/a Primary Provider:  Loreen Freud, DO  CC:  chest pain for the fisrt couple of days after pt got out of [facility]...no other complaints today.  History of Present Illness: Films reviewed today.  At present, grafts remain open.  Films reviewed in detail from both 2007 and 2011.  Shown to patient and comparison as well.  Could not tolerate imdur. Doing well since discharge from the hospital.  Long discussion today.  Current Medications (verified): 1)  Celebrex 200 Mg  Caps (Celecoxib) .Marland Kitchen.. 1 By Mouth Once Daily 2)  Valtrex 500 Mg Tabs (Valacyclovir Hcl) .... Take 1 Tablet By Mouth Twice A Day As Needed 3)  Tramadol Hcl 50 Mg Tabs (Tramadol Hcl) .... Take As Needed Pain 4)  Altace 5 Mg  Caps (Ramipril) .Marland Kitchen.. 1 By Mouth Two Times A Day 5)  Clonazepam 1 Mg  Tabs (Clonazepam) .Marland Kitchen.. 1 1/2 Tab Once Daily 6)  Adult Aspirin Low Strength 81 Mg  Tbdp (Aspirin) .Marland Kitchen.. 1 By Mouth Once Daily 7)  Toprol Xl 25 Mg Xr24h-Tab (Metoprolol Succinate) .Marland Kitchen.. 1 Tab Two Times A Day 8)  Zanaflex 4 Mg Caps (Tizanidine Hcl) .... As Needed 9)  Pravachol 80 Mg  Tabs (Pravastatin Sodium) .Marland Kitchen.. 1 By Mouth At Bedtime 10)  Avodart 0.5 Mg  Caps (Dutasteride) .Marland Kitchen.. 1 By Mouth Once Daily 11)  Vicodin Es 7.5-750 Mg Tabs (Hydrocodone-Acetaminophen) .Marland Kitchen.. 1 By Mouth Every 6 Hours As Needed 12)  Remeron 30 Mg Tabs (Mirtazapine) .... As Needed 13)  Eql Coq10 300 Mg Caps (Coenzyme Q10) .... Once A Day 14)  Tumeric 500mg  Cap .... Once A Day 15)  Omega-3 Krill Oil 300 Mg Caps (Krill Oil) .... 0nce A Day  Allergies: 1)  ! Pcn 2)  ! 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) DIARRHEA-PRESUMED INFECTIOUS (ICD-009.3) GERD (ICD-530.81) PERSONAL HX COLONIC POLYPS (ICD-V12.72) FEVER  UNSPECIFIED (ICD-780.60) DIARRHEA (ICD-787.91) SHINGLES, HX OF (ICD-V13.8) PREVENTIVE HEALTH CARE (ICD-V70.0) ENCOUNTER FOR LONG-TERM USE OF OTHER MEDICATIONS (ICD-V58.69) RIB PAIN, LEFT SIDED (ICD-786.50) INSOMNIA, PERSISTENT (ICD-307.42) NECK PAIN (ICD-723.1) OTITIS EXTERNA, RIGHT (ICD-380.10)  Vital Signs:  Patient profile:   60 year old male Height:      76.5 inches Weight:      195 pounds BMI:     23.51 Pulse rate:   71 / minute Pulse rhythm:   irregular BP sitting:   110 / 80  (left arm) Cuff size:   large  Vitals Entered By: Danielle Rankin, CMA (July 07, 2010 3:05 PM)  Physical Exam  General:  Well developed, well nourished, in no acute distress. Head:  normocephalic and atraumatic Eyes:  PERRLA/EOM intact; conjunctiva and lids normal. Lungs:  Clear bilaterally to auscultation and percussion. Heart:  NOrmal S1 and S2.  No definite murmur.  Abdomen:  Bowel sounds positive; abdomen soft and non-tender without masses, organomegaly, or hernias noted. No hepatosplenomegaly.  No palpable masses. Pulses:  pulses normal in all 4 extremities Extremities:  No clubbing or cyanosis. Neurologic:  Alert and oriented x 3.   Cardiac Cath  Procedure date:  06/06/2010  Findings:      1. Severe three-vessel coronary artery disease as described above     which is unchanged. 2. 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 3. The saphenous vein sequential graft to the first obtuse  marginal     artery and second obtuse marginal artery has some mild plaquing in     it.  The first limb is chronically occluded.  There is a 50% lesion     in the proximal part of the graft to moderate diffuse disease     throughout the middle of the graft. 4. Saphenous vein Y-graft to the right coronary artery and posterior     descending artery is patent.  The posterior descending artery is     tiny and has dye hang-up in the graft which is chronic.  Saphenous     vein graft to the ramus is  patent. 5. Left ventricular functions with an ejection fraction of 45-50%.   PLAN/DISCUSSION:  Overall, he has no significant change in his catheterization since 2007 except perhaps maybe a slight decrease in his LV function which was previously 60%.  There was no culprit lesion to explain his chest pain.  Recommendations will be for continued medical therapy.  EKG  Procedure date:  07/07/2010  Findings:      NSR.  Inferior MI, old.  Nonspecific ST and T wave changes.  Impression & Recommendations:  Problem # 1:  CAD, ARTERY BYPASS GRAFT (ICD-414.04)  Seems to be doing well.  Could not tolerate imdur, and had some pain just after leaving, but none recently. NOw with no symptoms. His updated medication list for this problem includes:    Altace 5 Mg Caps (Ramipril) .Marland Kitchen... 1 by mouth two times a day    Adult Aspirin Low Strength 81 Mg Tbdp (Aspirin) .Marland Kitchen... 1 by mouth once daily    Toprol Xl 25 Mg Xr24h-tab (Metoprolol succinate) .Marland Kitchen... 1 tab two times a day    Nitrostat 0.4 Mg Subl (Nitroglycerin) .Marland Kitchen... 1 tablet under tongue at onset of chest pain; you may repeat every 5 minutes for up to 3 doses.  Orders: EKG w/ Interpretation ([address])  Problem # 2:  HYPERLIPIDEMIA (ICD-272.4)  Continue current regimen. His updated medication list for this problem includes:    Pravachol 80 Mg Tabs (Pravastatin sodium) .Marland Kitchen... 1 by mouth at bedtime  Orders: EKG w/ Interpretation ([address])  Problem # 3:  HYPERTENSION (ICD-401.9) controlled. His updated medication list for this problem includes:    Altace 5 Mg Caps (Ramipril) .Marland Kitchen... 1 by mouth two times a day    Adult Aspirin Low Strength 81 Mg Tbdp (Aspirin) .Marland Kitchen... 1 by mouth once daily    Toprol Xl 25 Mg Xr24h-tab (Metoprolol succinate) .Marland Kitchen... 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  Most recent 2.8 by 2.8  Patient Instructions: 1)  Your physician recommends that you schedule a follow-up  appointment in: 2 MONTHS.  2)  Your physician recommends that you continue on your current medications as directed. Please refer to the Current Medication list given to you today. Prescriptions: NITROSTAT 0.4 MG SUBL (NITROGLYCERIN) 1 tablet under tongue at onset of chest pain; you may repeat every 5 minutes for up to 3 doses.  #25 x 1   Entered by:   Julieta Gutting, RN, BSN   Authorized by:   Ronaldo Miyamoto, MD, [facility]   Signed by:   Julieta Gutting, RN, BSN on 07/07/2010   Method used:   [facility]  [address] [phone][facility]ail[facility]       [address]       [place]  [place], [place]  [address]       Ph: [phone] or [phone]       Fax: [phone]   RxID:   [id number] TOPROL XL 25 MG XR24H-TAB (METOPROLOL SUCCINATE) 1 tab two times a day  #180 x 3   Entered by:   Danielle Rankin, CMA   Authorized by:   Ronaldo Miyamoto, MD, [facility]   Signed by:   Danielle Rankin, CMA on 07/07/2010   Method used:   Faxed to ...       [facility] (mail-order)             , [place]         Ph:        Fax: [phone]   RxID:   [id number]

## 2011-01-15 NOTE — Progress Notes (Signed)
Summary: re labwork  Phone Note Call from Patient   Caller: Patient Reason for Call: Talk to Nurse Summary of Call: pt calling to see if he needs labwork at next visit? [phone] Initial call taken by: Glynda Jaeger,  September 03, 2010 2:56 PM  Follow-up for Phone Call        Left message on identified voicemail that the pt does not require fasting bloodwork prior to his appt.  Lipids were just checked in June. Follow-up by: Julieta Gutting, RN, BSN,  September 03, 2010 3:17 PM

## 2011-01-15 NOTE — Progress Notes (Signed)
Summary: COL?   Phone Note Call from Patient Call back at Home Phone [phone]   Caller: wife, [name] Call For: Dr. Marina Goodell Reason for Call: Talk to Nurse Summary of Call: wife thouht pt was supposed to be having a COL soon as a f/u yo previous tests and diagnosis Initial call taken by: Vallarie Mare,  March 18, 2010 1:33 PM  Follow-up for Phone Call        Pt. ntfd. that polyps were hyperplastic so colon recall is 2013. Follow-up by: Teryl Lucy RN,  March 18, 2010 1:48 PM

## 2011-01-15 NOTE — Assessment & Plan Note (Signed)
Summary: cpx /cbs   Vital Signs:  Patient profile:   60 year old male Height:      76.25 inches Weight:      191 pounds BMI:     23.18 BSA:     2.18 Pulse rate:   86 / minute Pulse rhythm:   regular Resp:     17 per minute BP sitting:   140 / 80  (left arm) Cuff size:   regular  Vitals Entered By: Ardyth Man (April 08, 2009 8:33 AM) Comments CPX.  Pt sees Dr. [name] for cardio and had labs 02/11/09. Pt has ongoing back pain (x 1 week) and suspects he has shingles.  Pt requests med refill for Valtrex and Zanaflex.     History of Present Illness: Pt here for CPE . Pt c/o rash on R side that he thinks is shingles---He started taking his Valtrex 500 two times a day on Friday.   Pt seeing Dr [name] regularly and labs were just done and Urology checks prostate/ PSA.  Preventive Screening-Counseling & Management     Alcohol drinks/day: 2     Alcohol type: wine     >5/day in last 3 mos: no     Smoking Status: quit > 6 months     Year Quit: 1997     Caffeine use/day: 2-3     Caffeine Counseling: decrease use of caffeine     Diet Comments: well balanced     Does Patient Exercise: yes     Type of exercise: walking     Exercise (avg: min/session): <30     Times/week: 5     Dental Visit-last 6 months yes     Dental Care Counseling: not indicated; dental care within six months     TSE monthly: yes     Testicular SE Education/Counseling not indicated; STE done regularly     Sun Exposure-Excessive: no     Sun Exposure Counseling: not indicated; sun exposure is acceptable     Seat Belt Use: yes     Seat Belt Counseling: not indicated; patient wears seat belts  Problems Prior to Update: 1)  Shingles, Hx of  (ICD-V13.8) 2)  Preventive Health Care  (ICD-V70.0) 3)  Encounter For Long-term Use of Other Medications  (ICD-V58.69) 4)  Coronary Atherosclerosis Native Coronary Artery  (ICD-414.01) 5)  Rib Pain, Left Sided  (ICD-786.50) 6)  Insomnia, 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 1 By Mouth Once Daily 2)  Valtrex 500 Mg Tabs (Valacyclovir Hcl) .... Take 1 Tablet By Mouth Twice A Day As Needed 3)  Ambien Cr 12.5 Mg Tbcr (Zolpidem Tartrate) .... Take 1 Tablet By Mouth Every Night 4)  Tramadol Hcl 50 Mg Tabs (Tramadol Hcl) 5)  Altace 5 Mg  Caps (Ramipril) .Marland Kitchen.. 1 By Mouth Two Times A Day 6)  Clonazepam 1 Mg  Tabs (Clonazepam) .Marland Kitchen.. 1 By Mouth 4 Days Every Week 7)  Adult Aspirin Low Strength 81 Mg  Tbdp (Aspirin) .Marland Kitchen.. 1 By Mouth Once Daily 8)  Toprol Xl 25 Mg  Tb24 (Metoprolol Succinate) .Marland Kitchen.. 1 By Mouth Once Daily 9)  Zanaflex 2 Mg  Caps (Tizanidine Hcl) .Marland Kitchen.. 1 By Mouth As Needed 10)  Pravachol 80 Mg  Tabs (Pravastatin  Sodium) .Marland Kitchen.. 1 By Mouth At Bedtime 11)  Avodart 0.5 Mg  Caps (Dutasteride) .Marland Kitchen.. 1 By Mouth Once Daily 12)  Rozerem 8 Mg  Tabs (Ramelteon) .... Take One Tablet At Bedtime 13)  Vicodin Es 7.5-750 Mg Tabs (Hydrocodone-Acetaminophen) .Marland Kitchen.. 1 By Mouth Every 6 Hours As Needed  Current Medications (verified): 1)  Celebrex 200 Mg  Caps (Celecoxib) .Marland Kitchen.. 1 By Mouth Once Daily 2)  Valtrex 500 Mg Tabs (Valacyclovir Hcl) .... Take 1 Tablet By Mouth Twice A Day As Needed 3)  Ambien Cr 12.5 Mg Tbcr (Zolpidem Tartrate) .... Take 1 Tablet By Mouth Every Night 4)  Tramadol Hcl 50 Mg Tabs (Tramadol Hcl) 5)  Altace 5 Mg  Caps (Ramipril) .Marland Kitchen.. 1 By Mouth Two Times A Day 6)  Clonazepam 1 Mg  Tabs (Clonazepam) .Marland Kitchen.. 1 By Mouth 4 Days Every Week 7)  Adult Aspirin Low Strength 81 Mg  Tbdp (Aspirin) .Marland Kitchen.. 1 By Mouth Once Daily 8)  Toprol Xl 25 Mg  Tb24 (Metoprolol Succinate) .Marland Kitchen.. 1 By Mouth Once Daily 9)  Zanaflex 2 Mg  Caps (Tizanidine Hcl) .Marland Kitchen.. 1 By Mouth As Needed 10)  Pravachol 80 Mg  Tabs (Pravastatin Sodium) .Marland Kitchen.. 1 By Mouth At  Bedtime 11)  Avodart 0.5 Mg  Caps (Dutasteride) .Marland Kitchen.. 1 By Mouth Once Daily 12)  Vicodin Es 7.5-750 Mg Tabs (Hydrocodone-Acetaminophen) .Marland Kitchen.. 1 By Mouth Every 6 Hours As Needed 13)  Pristiq 50 Mg Xr24h-Tab (Desvenlafaxine Succinate) .... Take One Tab By Mouth Once Daily 14)  Valtrex 1 Gm Tabs (Valacyclovir Hcl) .Marland Kitchen.. 1 By Mouth Three Times A Day 15)  Vicodin Es 7.5-750 Mg Tabs (Hydrocodone-Acetaminophen) .Marland Kitchen.. 1 By Mouth Every 6 Years As Needed Pain  Allergies (verified): 1)  ! Pcn  Past History:  Risk Factors:    Alcohol Use: 2 (04/08/2009)    >5 drinks/d w/in last 3 months: no (04/08/2009)    Caffeine Use: 2-3 (04/08/2009)    Diet: well balanced (04/08/2009)    Exercise: yes (04/08/2009)  Risk Factors:    Smoking Status: quit > 6 months (04/08/2009)    Packs/Day: N/A    Cigars/wk: N/A    Pipe Use/wk: N/A    Cans of tobacco/wk: N/A    Passive Smoke Exposure: N/A  Past medical, surgical, family and social histories (including risk factors) reviewed, and no changes noted (except as noted below).  Past Medical History:    Reviewed history from 08/29/2008 and no changes required:    Hyperlipidemia-- statin intolerance    Hypertension    Current Problems:     RIB PAIN, LEFT SIDED (ICD-786.50)    INSOMNIA, PERSISTENT (ICD-307.42)    HYPERTENSION (ICD-401.9)    CAD (ICD-414.00)    HYPERLIPIDEMIA (ICD-272.4)    SYNCOPE (ICD-780.2)    NECK PAIN (ICD-723.1)    OTITIS EXTERNA, RIGHT (ICD-380.10)  Past Surgical History:    Reviewed history from 08/01/2007 and no changes required:    Cervical laminectomy   Family History:    Reviewed history and no changes required:  Social History:    Reviewed history and no changes required:    Smoking Status:  quit > 6 months    Caffeine use/day:  2-3    Does Patient Exercise:  yes    Dental Care w/in 6 mos.:  yes    Sun Exposure-Excessive:  no    Seat Belt Use:  yes  Review of Systems      See HPI General:  Denies chills, fatigue,  fever, loss of appetite,  malaise, sleep disorder, sweats, weakness, and weight loss. Eyes:  Denies blurring, discharge, double vision, eye irritation, eye pain, halos, itching, light sensitivity, red eye, vision loss-1 eye, and vision loss-both eyes. ENT:  Denies decreased hearing, difficulty swallowing, ear discharge, earache, hoarseness, nasal congestion, nosebleeds, postnasal drainage, ringing in ears, sinus pressure, and sore throat. CV:  Denies bluish discoloration of lips or nails, chest pain or discomfort, difficulty breathing at night, difficulty breathing while lying down, fainting, fatigue, leg cramps with exertion, lightheadness, near fainting, palpitations, shortness of breath with exertion, swelling of feet, swelling of hands, and weight gain. Resp:  Denies chest discomfort, chest pain with inspiration, cough, coughing up blood, excessive snoring, hypersomnolence, morning headaches, pleuritic, shortness of breath, sputum productive, and wheezing. GI:  Denies abdominal pain, bloody stools, change in bowel habits, constipation, dark tarry stools, diarrhea, excessive appetite, gas, hemorrhoids, indigestion, loss of appetite, nausea, vomiting, vomiting blood, and yellowish skin color. GU:  Denies decreased libido, discharge, dysuria, erectile dysfunction, genital sores, hematuria, incontinence, nocturia, urinary frequency, and urinary hesitancy. MS:  Denies joint pain, joint redness, joint swelling, loss of strength, low back pain, mid back pain, muscle aches, muscle , cramps, muscle weakness, stiffness, and thoracic pain. Derm:  Denies changes in color of skin, changes in nail beds, dryness, excessive perspiration, flushing, hair loss, insect bite(s), itching, lesion(s), poor wound healing, and rash; + blisters R side. Neuro:  Denies brief paralysis, difficulty with concentration, disturbances in coordination, falling down, headaches, inability to speak, memory loss, numbness, poor balance,  seizures, sensation of room spinning, tingling, tremors, visual disturbances, and weakness. Psych:  Denies alternate hallucination ( auditory/visual), anxiety, depression, easily angered, easily tearful, irritability, mental problems, panic attacks, sense of great danger, suicidal thoughts/plans, thoughts of violence, unusual visions or sounds, and thoughts /plans of harming others. Endo:  Denies cold intolerance, excessive hunger, excessive thirst, excessive urination, heat intolerance, polyuria, and weight change. Heme:  Denies abnormal bruising, bleeding, enlarge lymph nodes, fevers, pallor, and skin discoloration.  Physical Exam  General:  Well-developed,well-nourished,in no acute distress; alert,appropriate and cooperative throughout examination Head:  Normocephalic and atraumatic without obvious abnormalities. No apparent alopecia or balding. Eyes:  vision grossly intact, pupils equal, pupils round, pupils reactive to light, and no injection.   Ears:  External ear exam shows no significant lesions or deformities.  Otoscopic examination reveals clear canals, tympanic membranes are intact bilaterally without bulging, retraction, inflammation or discharge. Hearing is grossly normal bilaterally. Nose:  External nasal examination shows no deformity or inflammation. Nasal mucosa are pink and moist without lesions or exudates. Mouth:  Oral mucosa and oropharynx without lesions or exudates.  Teeth in good repair. Neck:  No deformities, masses, or tenderness noted.no carotid bruits.   Chest Wall:  No deformities, masses, tenderness or gynecomastia noted. Breasts:  No masses or gynecomastia noted Lungs:  Normal respiratory effort, chest expands symmetrically. Lungs are clear to auscultation, no crackles or wheezes. Heart:  Normal rate and regular rhythm. S1 and S2 normal without gallop, murmur, click, rub or other extra sounds. Abdomen:  Bowel sounds positive,abdomen soft and non-tender without masses,  organomegaly or hernias noted. Rectal:  urology Genitalia:  urology Prostate:  Uro Msk:  normal ROM, no joint tenderness, no joint swelling, no joint warmth, no redness over joints, no joint deformities, no joint instability, and no crepitation.   Pulses:  R dorsalis pedis normal, R carotid normal, L radial normal, L posterior tibial normal, L dorsalis pedis normal, and L carotid normal.   Extremities:  No clubbing, cyanosis, edema, or deformity noted with normal full range of motion of all joints.   Neurologic:  No cranial nerve deficits noted. Station and gait are normal. Plantar reflexes are down-going bilaterally. DTRs are symmetrical throughout. Sensory, motor and coordinative functions appear intact. Skin:  Intact without suspicious lesions or rashes Cervical Nodes:  No lymphadenopathy noted Axillary Nodes:  No palpable lymphadenopathy Psych:  Cognition and judgment appear intact. Alert and cooperative with normal attention span and concentration. No apparent delusions, illusions, hallucinations   Impression & Recommendations:  Problem # 1:  PREVENTIVE HEALTH CARE (ICD-V70.0)  Reviewed preventive care protocols, scheduled due services, and updated immunizations. Labs done by uro and cardio  Problem # 2:  HYPERTENSION (ICD-401.9)  His updated medication list for this problem includes:    Altace 5 Mg Caps (Ramipril) .Marland Kitchen... 1 by mouth two times a day    Toprol Xl 25 Mg Tb24 (Metoprolol succinate) .Marland Kitchen... 1 by mouth once daily  BP today: 140/80 Prior BP: 120/74 (08/29/2008)  Labs Reviewed: K+: 4.4 (02/11/2009) Creat: : 0.9 (02/11/2009)   Chol: 198 (02/11/2009)   HDL: 38.7 (02/11/2009)   LDL: 123 (02/11/2009)   TG: 180 (02/11/2009)  Problem # 3:  HYPERLIPIDEMIA (ICD-272.4)  His updated medication list for this problem includes:    Pravachol 80 Mg Tabs (Pravastatin sodium) .Marland Kitchen... 1 by mouth at bedtime  Labs Reviewed: SGOT: 20 (02/11/2009)   SGPT: 22 (02/11/2009)   HDL:38.7  (02/11/2009), 35.4 (12/23/2007)  LDL:123 (02/11/2009), 96 (59/56/3875)  Chol:198 (02/11/2009), 155 (12/23/2007)  Trig:180 (02/11/2009), 117 (12/23/2007)  Problem # 4:  CAD (ICD-414.00)  His updated medication list for this problem includes:    Altace 5 Mg Caps (Ramipril) .Marland Kitchen... 1 by mouth two times a day    Adult Aspirin Low Strength 81 Mg Tbdp (Aspirin) .Marland Kitchen... 1 by mouth once daily    Toprol Xl 25 Mg Tb24 (Metoprolol succinate) .Marland Kitchen... 1 by mouth once daily  Labs Reviewed: Chol: 198 (02/11/2009)   HDL: 38.7 (02/11/2009)   LDL: 123 (02/11/2009)   TG: 180 (02/11/2009)  Problem # 5:  INSOMNIA, PERSISTENT (ICD-307.42)  Problem # 6:  SHINGLES, HX OF (ICD-V13.8) valtrex 1 g three times a day for 10 days lyrica 75 two times a day  vicodin for pain  Complete Medication List: 1)  Celebrex 200 Mg Caps (Celecoxib) .Marland Kitchen.. 1 by mouth once daily 2)  Valtrex 500 Mg Tabs (Valacyclovir hcl) .... Take 1 tablet by mouth twice a day as needed 3)  Ambien Cr 12.5 Mg Tbcr (Zolpidem tartrate) .... Take 1 tablet by mouth every night 4)  Tramadol Hcl 50 Mg Tabs (Tramadol hcl) 5)  Altace 5 Mg Caps (Ramipril) .Marland Kitchen.. 1 by mouth two times a day 6)  Clonazepam 1 Mg Tabs (Clonazepam) .Marland Kitchen.. 1 by mouth 4 days every week 7)  Adult Aspirin Low Strength 81 Mg Tbdp (Aspirin) .Marland Kitchen.. 1 by mouth once daily 8)  Toprol Xl 25 Mg Tb24 (Metoprolol succinate) .Marland Kitchen.. 1 by mouth once daily 9)  Zanaflex 2 Mg Caps (Tizanidine hcl) .Marland Kitchen.. 1 by mouth as needed 10)  Pravachol 80 Mg Tabs (Pravastatin sodium) .Marland Kitchen.. 1 by mouth at bedtime 11)  Avodart 0.5 Mg Caps (Dutasteride) .Marland Kitchen.. 1 by mouth once daily 12)  Vicodin Es 7.5-750 Mg Tabs (Hydrocodone-acetaminophen) .Marland Kitchen.. 1 by mouth every 6 hours as needed 13)  Pristiq 50 Mg Xr24h-tab (Desvenlafaxine succinate) .... Take one tab by mouth once daily 14)  Valtrex 1 Gm Tabs (Valacyclovir hcl) .Marland Kitchen.. 1 by mouth  three times a day 15)  Vicodin Es 7.5-750 Mg Tabs (Hydrocodone-acetaminophen) .Marland Kitchen.. 1 by mouth every 6  years as needed pain 16)  Lyrica 75 Mg Caps (Pregabalin) .Marland Kitchen.. 1 by mouth two times a day Prescriptions: ZANAFLEX 2 MG  CAPS (TIZANIDINE HCL) 1 by mouth as needed  #90 x 1   Entered and Authorized by:   Loreen Freud DO   Signed by:   Loreen Freud DO on 04/08/2009   Method used:   Print then Give to Patient   RxID:   [id number] ZANAFLEX 2 MG  CAPS (TIZANIDINE HCL) 1 by mouth as needed  #30 x 1   Entered and Authorized by:   Loreen Freud DO   Signed by:   Loreen Freud DO on 04/08/2009   Method used:   Print then Give to Patient   RxID:   [id number] VICODIN ES 7.5-750 MG TABS (HYDROCODONE-ACETAMINOPHEN) 1 by mouth every 6 years as needed pain  #30 x 0   Entered and Authorized by:   Loreen Freud DO   Signed by:   Loreen Freud DO on 04/08/2009   Method used:   Print then Give to Patient   RxID:   [id number] VALTREX 1 GM TABS (VALACYCLOVIR HCL) 1 by mouth three times a day  #30 x 0   Entered and Authorized by:   Loreen Freud DO   Signed by:   Loreen Freud DO on 04/08/2009   Method used:   Electronically to        [facility]  [phone] (retail)       [address]       [place], [place]  [address]       Ph: [phone] or [phone]       Fax: [phone]   RxID:   [id number]

## 2011-01-15 NOTE — Assessment & Plan Note (Signed)
Summary: persistent diarrhea-Dr.Perry pt.    History of Present Illness Visit Type: Initial Consult Primary GI MD: Stephen Flemings MD Primary Provider: Loreen Freud, DO Chief Complaint: persistent diarrhea x 6 weeks and nausea now had a colonoscopy at [facility] 4 years ago unsure of Name of doctor with h/o colon polyps History of Present Illness:   Last seen 2002 by Dr. Marina [name] for GERD.  He is s/p nissen fundoplication six years ago. Still doing well from that standpoint. Here for six week history of diarrhea which has now subsided. Had a respiratory infection  and diarrhea few weeks ago. Given Z-pak.Respiratory symptoms cleared, diarrhea didn't improve. PCP have him Flagyl which he took for 10 days, and completed a week ago.  Started drinking Propel sports drink several days ago and " things have turned around". Believes some of the vitamins and iron in sports drink may be causing some constipation. No other complaints. Patient believes he is due for a colonoscopy.    GI Review of Systems    Reports acid reflux.      Denies abdominal pain, belching, bloating, chest pain, dysphagia with liquids, dysphagia with solids, heartburn, loss of appetite, nausea, vomiting, vomiting blood, weight loss, and  weight gain.      Reports change in bowel habits, diarrhea, diverticulosis, and  hemorrhoids.     Denies anal fissure, black tarry stools, constipation, fecal incontinence, heme positive stool, irritable bowel syndrome, jaundice, light color stool, liver problems, and  rectal bleeding. Preventive Screening-Counseling & Management  Alcohol-Tobacco     Smoking Status: quit      Drug Use:  no.      Current Medications (verified): 1)  Celebrex 200 Mg  Caps (Celecoxib) .Marland Kitchen.. 1 By Mouth Once Daily 2)  Valtrex 500 Mg Tabs (Valacyclovir Hcl) .... Take 1 Tablet By Mouth Twice A Day As Needed 3)  Tramadol Hcl 50 Mg Tabs (Tramadol Hcl) .... Take As Needed Pain 4)  Altace 5 Mg  Caps (Ramipril) .Marland Kitchen.. 1 By Mouth  Two Times A Day 5)  Clonazepam 1 Mg  Tabs (Clonazepam) .Marland Kitchen.. 11/2 By Mouth 4 Days Every Week 6)  Adult Aspirin Low Strength 81 Mg  Tbdp (Aspirin) .Marland Kitchen.. 1 By Mouth Once Daily 7)  Toprol Xl 25 Mg  Tb24 (Metoprolol Succinate) .Marland Kitchen.. 1 By Mouth Once Daily 8)  Zanaflex 4 Mg Caps (Tizanidine Hcl) .Marland Kitchen.. 1 By Mouth Two Times A Day 9)  Pravachol 80 Mg  Tabs (Pravastatin Sodium) .Marland Kitchen.. 1 By Mouth At Bedtime 10)  Avodart 0.5 Mg  Caps (Dutasteride) .Marland Kitchen.. 1 By Mouth Once Daily 11)  Vicodin Es 7.5-750 Mg Tabs (Hydrocodone-Acetaminophen) .Marland Kitchen.. 1 By Mouth Every 6 Hours As Needed 12)  Pristiq 50 Mg Xr24h-Tab (Desvenlafaxine Succinate) .... Take One Tab By Mouth Once Daily 13)  Remeron 30 Mg Tabs (Mirtazapine) .Marland Kitchen.. 1 By Mouth At Night  Allergies (verified): 1)  ! Pcn  Past History:  Past Medical History: Hyperlipidemia-- statin intolerance Hypertension Current Problems:  RIB PAIN, LEFT SIDED (ICD-786.50) INSOMNIA, PERSISTENT (ICD-307.42) HYPERTENSION (ICD-401.9) CAD (ICD-414.00) HYPERLIPIDEMIA (ICD-272.4) SYNCOPE (ICD-780.2) NECK PAIN (ICD-723.1) OTITIS EXTERNA, RIGHT (ICD-380.10) GERD Adenomatous Colon Polyps  Past Surgical History: Cervical laminectomy Hiatal Hernia surgery Heart bypass x 6  Family History: Reviewed history and no changes required. Alcohilism-Mother No FH of Colon Cancer:  Social History: Reviewed history and no changes required. 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  Alcohol Use - yes  Daily Caffeine Use Illicit Drug Use -  no Smoking Status:  quit Drug Use:  no  Review of Systems       The patient complains of back pain and sleeping problems.  The patient denies allergy/sinus, anemia, anxiety-new, arthritis/joint pain, blood in urine, breast changes/lumps, change in vision, confusion, cough, coughing up blood, depression-new, fainting, fatigue, fever, headaches-new, hearing problems, heart murmur, heart rhythm changes, itching, muscle  pains/cramps, night sweats, nosebleeds, shortness of breath, skin rash, sore throat, swelling of feet/legs, swollen lymph glands, thirst - excessive, urination - excessive, urination changes/pain, urine leakage, vision changes, and voice change.    Vital Signs:  Patient profile:   60 year old male Height:      76.25 inches Weight:      191.25 pounds BMI:     23.21 Pulse rate:   68 / minute Pulse rhythm:   regular BP sitting:   128 / 82  (left arm)  Vitals Entered By: [name] NCMA (February 14, 2010 9:52 AM)  Physical Exam  General:  Well developed, well nourished, no acute distress. Head:  Normocephalic and atraumatic. Eyes:  Conjunctiva pink, no icterus.  Mouth:  No oral lesions. Tongue moist.  Neck:  no obvious masses  Lungs:  Clear throughout to auscultation. Heart:  Regular rate and rhythm; no murmurs, rubs,  or bruits. Abdomen:  Abdomen soft, nontender, nondistended. No obvious masses or hepatomegaly.Normal bowel sounds.   Msk:  Symmetrical with no gross deformities. Normal posture. Extremities:  No palmar erythema, no edema. Long surgical scar inner LLE  Neurologic:  Alert and  oriented x4;  grossly normal neurologically. Skin:  Intact without significant lesions or rashes. Cervical Nodes:  No significant cervical adenopathy. Psych:  Alert and cooperative. Normal mood and affect.   Impression & Recommendations:  Problem # 1:  DIARRHEA-PRESUMED INFECTIOUS (ICD-009.3) Assessment Comment Only Started at same time of respiratory infection several weeks ago. Z-Pak helped respiratory symptoms but not diarrhea. Since diarrhea predated antibiotics, doubt C-Difficle. At any rate, diarrhea did resolve after ten days of Flagyl (given by PCP). Stool culture negative, C-Diff negative x1. Labs prior to treatment, including CMET, CBC, TSH were unremarkable. Patient looks and feels okay.  Problem # 2:  PERSONAL HX COLONIC POLYPS (ICD-V12.72) Assessment: Comment Only Had  colonoscopy with polypectomy in [place] (closer to his home) in ?2007. Patient was told he needed repeat exam in 3-5 years. He would like for Dr. Marina [name] to do all future colonoscopies. I have requested his records and, once they are reviewed,  patient will be scheduled for surveillance colonoscopy as indicated.   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  Followed by Dr. [name]  Problem # 4:  CAD (ICD-414.00) Assessment: Comment Only History of CABG 2002.  Followed by Dr. [name]

## 2011-01-15 NOTE — Assessment & Plan Note (Signed)
Summary: 2weeks//tl   Vital Signs:  Patient Profile:   60 Years Old Male Weight:      189.6 pounds Pulse rate:   72 / minute BP sitting:   130 / 78  (left arm) Cuff size:   regular  Vitals Entered By: Shonna Chock (August 17, 2007 11:30 AM)                 PCP:  Laury Axon  Chief Complaint:  2 WEEK FOLLOW/UP: B/P CONCERNS. DISCUSS TRAZADONE- NOT HELPING and Insomnia.  History of Present Illness:  Hypertension Follow-Up      This is a 60 year old man who presents for Hypertension follow-up.  The patient denies lightheadedness, urinary frequency, headaches, edema, impotence, rash, and fatigue.  The patient denies the following associated symptoms: chest pain, chest pressure, exercise intolerance, dyspnea, palpitations, syncope, leg edema, and pedal edema.  Compliance with medications (by patient report) has been near 100%.  The patient reports that dietary compliance has been good.  The patient reports exercising occasionally.  Adjunctive measures currently used by the patient include salt restriction.    Insomnia      The patient also presents with Insomnia.  The patient reports difficulty falling asleep and frequent awakening.  Risk factors for insomnia include irregular work schedule.  Past treatments that have been effective include sedatives and hypnotics.  Treatments tried in the past and found to be ineffective incude behavior modification, hypnosis, relaxation techniques, and hypnotics .    Current Allergies: ! PCN  Past Medical History:    Hyperlipidemia-- statin intolerance    Hypertension     Review of Systems      See HPI   Physical Exam  General:     Well-developed,well-nourished,in no acute distress; alert,appropriate and cooperative throughout examination Lungs:     Normal respiratory effort, chest expands symmetrically. Lungs are clear to auscultation, no crackles or wheezes. Heart:     normal rate, regular rhythm, and no murmur.   Extremities:     No  clubbing, cyanosis, edema, or deformity noted with normal full range of motion of all joints.      Impression & Recommendations:  Problem # 1:  HYPERTENSION (ICD-401.9) increase  Altace to two times a day again -- re check 2 -3 weeks His updated medication list for this problem includes:    Altace 5 Mg Caps (Ramipril) .Marland Kitchen... 1 by mouth two times a day    Toprol Xl 25 Mg Tb24 (Metoprolol succinate) .Marland Kitchen... 1 by mouth once daily  BP today: 130/78 Prior BP: 118/76 (08/01/2007)  Labs Reviewed: Creat: 0.7 (02/18/2007)   Problem # 2:  INSOMNIA, PERSISTENT (ICD-307.42) trazadone making pt drowsy but not sleeping well and drowsy all day long Ambien Cr 12.5 2-3 days a week and clonopine other days f/u Dr Doristine Locks  Complete Medication List: 1)  Celebrex 200 Mg Caps (Celecoxib) .Marland Kitchen.. 1 by mouth once daily 2)  Valtrex 500 Mg Tabs (Valacyclovir hcl) .... Take 1 tablet by mouth twice a day 3)  Ambien Cr 12.5 Mg Tbcr (Zolpidem tartrate) .... Take 1 tablet by mouth every night 4)  Tramadol Hcl 50 Mg Tabs (Tramadol hcl) 5)  Altace 5 Mg Caps (Ramipril) .Marland Kitchen.. 1 by mouth two times a day 6)  Trazodone Hcl 100 Mg Tabs (Trazodone hcl) .Marland Kitchen.. 1 by mouth 3 nights every week 7)  Clonazepam 1 Mg Tabs (Clonazepam) .Marland Kitchen.. 1 by mouth 4 days every week 8)  Adult Aspirin Low Strength 81 Mg Tbdp (  Aspirin) .Marland Kitchen.. 1 by mouth once daily 9)  Toprol Xl 25 Mg Tb24 (Metoprolol succinate) .Marland Kitchen.. 1 by mouth once daily 10)  Prednisone 10 Mg Tabs (Prednisone) .... 3 by mouth once daily for 3 days then 2 by mouth once daily for 3 days the 1 by mouth once daily for 3 days     Prescriptions: AMBIEN CR 12.5 MG TBCR (ZOLPIDEM TARTRATE) Take 1 tablet by mouth every night  #15 x 0   Entered and Authorized by:   Loreen Freud DO   Signed by:   Loreen Freud DO on 08/17/2007   Method used:   Reprint   RxID:   [id number] AMBIEN CR 12.5 MG TBCR (ZOLPIDEM TARTRATE) Take 1 tablet by mouth every night  #15 x 0   Entered and Authorized  by:   Loreen Freud DO   Signed by:   Loreen Freud DO on 08/17/2007   Method used:   Electronically sent to ...       [id number] [address]       [address]       [place], [place]  [address]       Ph: [phone] or [phone]       Fax: [phone]   RxID:   [id number]

## 2011-01-15 NOTE — Letter (Signed)
Summary: Chief Operating Officer Authorization   Imported By: Roderic Ovens 06/25/2010 09:33:08  _____________________________________________________________________  External Attachment:    Type:   Image     Comment:   External Document

## 2011-01-15 NOTE — Assessment & Plan Note (Signed)
Summary: follow up er/cbs   PCP:  [place]  Chief Complaint:  syncope.  History of Present Illness: Pt here f/u ER---  passed out this Am after using bathroom.  [place] went to [place].  Bp at home when wife took it after passing out was 46/ 20--- EMS came and got 48/36  then repeat 67/46.  Pt BP remained low in ER----Pt given IVF in Ambulance and then in ER.  Labs were done per pt and all was neg.  EKG done as well.  Pt d/c home to f/u here.  Pt feels better now.  He has not taken any BP meds  today.  Pt did take flexeril , trazadone,  together last night which he doen't normally do. LOC for few seconds only.  Vital Signs:  Patient Profile:   60 Years Old Male Weight:      188.4 pounds Pulse rate:   72 / minute BP sitting:   118 / 76 Cuff size:   regular  Vitals Entered By: Shonna Chock   Past Medical History:    Hyperlipidemia-- statin intolerance  Past Surgical History:    Cervical laminectomy  Review of Systems      See HPI  Vital Signs:  Patient Profile:   60 Years Old Male Weight:      188.4 pounds Pulse rate:   72 / minute BP sitting:   118 / 76  (left arm) Cuff size:   regular  Vitals Entered By: Shonna Chock (August 01, 2007 1:16 PM)   Physical Exam  General:     Well-developed,well-nourished,in no acute distress; alert,appropriate and cooperative throughout examination Eyes:     vision grossly intact, pupils equal, pupils round, and pupils reactive to light.   Ears:     External ear exam shows no significant lesions or deformities.  Otoscopic examination reveals clear canals, tympanic membranes are intact bilaterally without bulging, retraction, inflammation or discharge. Hearing is grossly normal bilaterally. Neck:     No deformities, masses, or tenderness noted.no carotid bruits.   Lungs:     Normal respiratory effort, chest expands symmetrically. Lungs are clear to auscultation, no crackles or wheezes. Heart:     normal rate, regular rhythm, and no  murmur.   Msk:     normal ROM, no joint tenderness, no joint swelling, no joint warmth, no redness over joints, no joint deformities, no joint instability, and no crepitation.   Extremities:     No clubbing, cyanosis, edema, or deformity noted with normal full range of motion of all joints.   Neurologic:     alert & oriented X3, cranial nerves II-XII intact, strength normal in all extremities, gait normal, and DTRs symmetrical and normal.   Skin:     Intact without suspicious lesions or rashes   Impression & Recommendations:  Problem # 1:  SYNCOPE (ICD-780.2) Assessment: New Probably secondary to Bp dropping--- decrease Altace to 1 a day and monitor BP at home--- f/u 2 weeks or sooner as needed.  I also reccom. not taking flexeril , clonazapine, trazadone all at once. Orders: Cardiology Referral (Cardiology)  carotid dopplers              Orders: Cardiology Referral (Cardiology) EKG w/ Interpretation (93000)   Problem # 2:  NECK PAIN (ICD-723.1) Assessment: New per neuro surgery-- check xray prednisone taper f/u ns if symptoms persist or worsen. The following medications were removed from the medication list:    Tizanidine Hcl 4 Mg Tabs (  Tizanidine hcl)  His updated medication list for this problem includes:    Celebrex 200 Mg Caps (Celecoxib) .Marland Kitchen... 1 by mouth once daily    Tramadol Hcl 50 Mg Tabs (Tramadol hcl)    Adult Aspirin Low Strength 81 Mg Tbdp (Aspirin) .Marland Kitchen... 1 by mouth once daily  Orders: Diagnostic X-Ray/Fluoroscopy (Diagnostic X-Ray/Flu)   Complete Medication List: 1)  Celebrex 200 Mg Caps (Celecoxib) .Marland Kitchen.. 1 by mouth once daily 2)  Valtrex 500 Mg Tabs (Valacyclovir hcl) .... Take 1 tablet by mouth twice a day 3)  Ambien Cr 12.5 Mg Tbcr (Zolpidem tartrate) .... Take 1 tablet by mouth every night 4)  Tramadol Hcl 50 Mg Tabs (Tramadol hcl) 5)  Altace 5 Mg Caps (Ramipril) .Marland Kitchen.. 1 by mouth two times a day 6)  Trazodone Hcl 100 Mg Tabs (Trazodone hcl) .Marland Kitchen.. 1 by  mouth 3 nights every week 7)  Clonazepam 1 Mg Tabs (Clonazepam) .Marland Kitchen.. 1 by mouth 4 days every week 8)  Adult Aspirin Low Strength 81 Mg Tbdp (Aspirin) .Marland Kitchen.. 1 by mouth once daily 9)  Toprol Xl 25 Mg Tb24 (Metoprolol succinate) .Marland Kitchen.. 1 by mouth once daily 10)  Prednisone 10 Mg Tabs (Prednisone) .... 3 by mouth once daily for 3 days then 2 by mouth once daily for 3 days the 1 by mouth once daily for 3 days Prescriptions: PREDNISONE 10 MG  TABS (PREDNISONE) 3 by mouth once daily for 3 days then 2 by mouth once daily for 3 days the 1 by mouth once daily for 3 days  #18 x 0   Entered and Authorized by:   Loreen Freud DO   Signed by:   Loreen Freud DO on 08/01/2007   Method used:   Print then Give to Patient   RxID:   [id number] TOPROL XL 25 MG  TB24 (METOPROLOL SUCCINATE) 1 by mouth once daily  #30 x 2   Entered and Authorized by:   Loreen Freud DO   Signed by:   Shonna Chock on 08/01/2007   Method used:   Print then Give to Patient   RxID:   [id number]

## 2011-01-15 NOTE — Letter (Signed)
Summary: [facility] - [facility]id  [facility], Main Office  [address]   [place], [place] [address]   Phone: [phone]  Fax: [phone]     May 23, 2010 MRN: [id number]   [name] [address] [place], [place]  [address]   Dear [name],  We have reviewed your cholesterol results.  They are as follows:     Total Cholesterol:    155 (Desirable: less than 200)       HDL  Cholesterol:     35.10  (Desirable: greater than 40 for men and 50 for women)       LDL Cholesterol:       123  (Desirable: less than 100 for low risk and less than 70 for moderate to high risk)       Triglycerides:       291.0  (Desirable: less than 150)  Our recommendations include: These are close to target, except triglycerides remain elevated. Please decrease the carbohydrates (cakes, cookies, pasta, bread and potatoes) in your diet.  Continue current medications.   Call our office at the number listed above if you have any questions.  Lowering your LDL cholesterol is important, but it is only one of a large number of "risk factors" that may indicate that you are at risk for heart disease, stroke or other complications of hardening of the arteries.  Other risk factors include:   A.  Cigarette Smoking* B.  High Blood Pressure* C.  Obesity* D.   Low HDL Cholesterol (see yours above)* E.   Diabetes Mellitus (higher risk if your is uncontrolled) F.  Family history of premature heart disease G.  Previous history of stroke or cardiovascular disease    *These are risk factors YOU HAVE CONTROL OVER.  For more information, visit .  There is now evidence that lowering the TOTAL CHOLESTEROL AND LDL CHOLESTEROL can reduce the risk of heart disease.  The American Heart Association recommends the following guidelines for the treatment of elevated cholesterol:  1.  If there is now current heart disease and less than two risk factors, TOTAL CHOLESTEROL should be less than 200 and LDL CHOLESTEROL should  be less than 100. 2.  If there is current heart disease or two or more risk factors, TOTAL CHOLESTEROL should be less than 200 and LDL CHOLESTEROL should be less than 70.  A diet low in cholesterol, saturated fat, and calories is the cornerstone of treatment for elevated cholesterol.  Cessation of smoking and exercise are also important in the management of elevated cholesterol and preventing vascular disease.  Studies have shown that 30 to 60 minutes of physical activity most days can help lower blood pressure, lower cholesterol, and keep your weight at a healthy level.  Drug therapy is used when cholesterol levels do not respond to therapeutic lifestyle changes (smoking cessation, diet, and exercise) and remains unacceptably high.  If medication is started, it is important to have you levels checked periodically to evaluate the need for further treatment options.  Thank you,    [employer] Team

## 2011-01-15 NOTE — Progress Notes (Signed)
Summary: Angina  Phone Note Call from Patient Call back at [facility] Phone [phone]   Caller: Patient Summary of Call: Pt request call Initial call taken by: Judie Grieve,  September 18, 2010 11:38 AM  Follow-up for Phone Call        I spoke with the pt and he has had some episodes of angina over the past few days. The pt has chewed ASA with these episodes and did have some relief of symptoms. The pt has not tried SL NTG and he did not tolerate Isosorbide.  The pt is having angina in the upper side of left chest area.  The pt said he left work yesterday and stayed out today.  The pt said he had spoken with his mother-in-law Paula Compton and they feel angina is caused by stress and anxiety.  The pt has tried multiple anxiety medications in the past but they cause diarrhea.  The pt wanted to know if Dr [name] would prescribe an anxiety medication.  I made the pt aware that Dr [name] does not prescribe anti-anxiety medications and this should be discussed with his PCP.  The pt will make an appt with Dr Laury Axon. I will forward this message to Dr [name] for any other recommendations.     Follow-up by: Julieta Gutting, RN, BSN,  September 18, 2010 1:38 PM

## 2011-01-15 NOTE — Assessment & Plan Note (Signed)
Summary: Diarrhea f/u    History of Present Illness Visit Type: Follow-up Visit Primary GI MD: Yancey Flemings MD Primary Provider: Loreen Freud, DO Requesting Provider: n/a Chief Complaint: follow up from diarrhea, pt states he has not had as bad diarrhea for 3 days. Pt states he took some Aciphex he thinks that is why its controlled, The Aciphex he got from his wife History of Present Illness:   60 year old with hypertension, hyperlipidemia, coronary artery disease, and GERD status post fundoplication. 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 The patient presents today for followup regarding problems with diarrhea. He is accompanied by his wife who participates in the encounter. The patient was seen by the GI nurse practitioner February 14, 2010 regarding a six-week history of diarrhea. At some point prior that he received Z-Pak. He also received a course of Flagyl with improvement in his diarrhea. He attributed the improvement to propel sports drink. Multiple laboratories in February were unremarkable. Since his visit we were able to obtain an outside colonoscopy report and pathology from [facility]. Complete colonoscopy was performed in March 2008. Polyps were hyperplastic. Recall 5 years has been entered into the computer. Apparently had a prior history of adenomatous polyps. However, no documentation. Patient's wife called our office March 18, 2010 stating that the patient's diarrhea was "no better". After reviewing the prior office note, I prescribed metronidazole 250 mg q.i.d. x2 weeks and Levbid 0.375 mg b.i.d. x2 weeks. Patient states that the pedicle therapy worked quite dramatically, to the point of constipation. No further diarrhea. He describes normal bowel movements over the past 3 days. For him, normal bowel movement is formed it occurs after each meal. No abdominal pain, bleeding, rather issues. 3-4 pound weight loss since the last visit. He tells me that he took his wife's AcipHex a few days ago because of  "acidic stool". He thinks this helped. He is wondering if he should take it more regularly.   GI Review of Systems    Reports acid reflux.      Denies abdominal pain, belching, bloating, chest pain, dysphagia with liquids, dysphagia with solids, heartburn, loss of appetite, nausea, vomiting, vomiting blood, weight loss, and  weight gain.      Reports diarrhea and  rectal bleeding.     Denies anal fissure, black tarry stools, change in bowel habit, constipation, diverticulosis, fecal incontinence, heme positive stool, hemorrhoids, irritable bowel syndrome, jaundice, light color stool, liver problems, and  rectal pain.    Current Medications (verified): 1)  Celebrex 200 Mg  Caps (Celecoxib) .Marland Kitchen.. 1 By Mouth Once Daily 2)  Valtrex 500 Mg Tabs (Valacyclovir Hcl) .... Take 1 Tablet By Mouth Twice A Day As Needed 3)  Tramadol Hcl 50 Mg Tabs (Tramadol Hcl) .... Take As Needed Pain 4)  Altace 5 Mg  Caps (Ramipril) .Marland Kitchen.. 1 By Mouth Two Times A Day 5)  Clonazepam 1 Mg  Tabs (Clonazepam) .Marland Kitchen.. 11/2 By Mouth 4 Days Every Week 6)  Adult Aspirin Low Strength 81 Mg  Tbdp (Aspirin) .Marland Kitchen.. 1 By Mouth Once Daily 7)  Toprol Xl 25 Mg  Tb24 (Metoprolol Succinate) .Marland Kitchen.. 1 By Mouth Once Daily 8)  Zanaflex 4 Mg Caps (Tizanidine Hcl) .Marland Kitchen.. 1 By Mouth Two Times A Day 9)  Pravachol 80 Mg  Tabs (Pravastatin Sodium) .Marland Kitchen.. 1 By Mouth At Bedtime 10)  Avodart 0.5 Mg  Caps (Dutasteride) .Marland Kitchen.. 1 By Mouth Once Daily 11)  Vicodin Es 7.5-750 Mg Tabs (Hydrocodone-Acetaminophen) .Marland Kitchen.. 1 By Mouth Every 6 Hours As  Needed 12)  Pristiq 50 Mg Xr24h-Tab (Desvenlafaxine Succinate) .... Take One Tab By Mouth Once Daily 13)  Remeron 30 Mg Tabs (Mirtazapine) .Marland Kitchen.. 1 By Mouth At Night  Allergies (verified): 1)  ! Pcn  Past History:  Past Medical History: Last updated: 03/05/2010 Adenomatous Colon Polyps Current Problems:  CAD, ARTERY BYPASS GRAFT (ICD-414.04) HYPERLIPIDEMIA (ICD-272.4) SYNCOPE (ICD-780.2) HYPERTENSION  (ICD-401.9) ABDOMINAL AORTIC ANEURYSM (ICD-441.4) DIARRHEA-PRESUMED INFECTIOUS (ICD-009.3) GERD (ICD-530.81) PERSONAL HX COLONIC POLYPS (ICD-V12.72) FEVER UNSPECIFIED (ICD-780.60) DIARRHEA (ICD-787.91) SHINGLES, HX OF (ICD-V13.8) PREVENTIVE HEALTH CARE (ICD-V70.0) ENCOUNTER FOR LONG-TERM USE OF OTHER MEDICATIONS (ICD-V58.69) RIB PAIN, LEFT SIDED (ICD-786.50) INSOMNIA, PERSISTENT (ICD-307.42) NECK PAIN (ICD-723.1) OTITIS EXTERNA, RIGHT (ICD-380.10)  Past Surgical History: Last updated: 03/05/2010 Cervical laminectomy Hiatal Hernia surgery Heart bypass x 6  Family History: Last updated: 03/05/2010 Alcohilism-Mother No FH of Colon Cancer:  Social History: Last updated: 03/05/2010 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  Alcohol Use - yes  Daily Caffeine Use Illicit Drug Use - no  Review of Systems       The patient complains of fatigue.  The patient denies allergy/sinus, anemia, anxiety-new, arthritis/joint pain, back pain, blood in urine, breast changes/lumps, change in vision, confusion, cough, coughing up blood, depression-new, fainting, fever, headaches-new, hearing problems, heart murmur, heart rhythm changes, itching, menstrual pain, muscle pains/cramps, night sweats, nosebleeds, pregnancy symptoms, shortness of breath, skin rash, sleeping problems, sore throat, swelling of feet/legs, swollen lymph glands, thirst - excessive , urination - excessive , urination changes/pain, urine leakage, vision changes, and voice change.    Vital Signs:  Patient profile:   60 year old male Height:      76.5 inches Weight:      187 pounds BMI:     22.55 BSA:     2.17 Pulse rate:   64 / minute Pulse rhythm:   regular BP sitting:   112 / 78  (left arm)  Vitals Entered By: Merri Ray CMA Duncan Dull) (April 09, 2010 1:28 PM)  Physical Exam  General:  Well developed, well nourished, no acute distress. Head:  Normocephalic and atraumatic. Eyes:   PERRLA, no icterus. Mouth:  No deformity or lesions, dentition normal. Lungs:  Clear throughout to auscultation. Heart:  Regular rate and rhythm; no murmurs, rubs,  or bruits. Abdomen:  Soft, nontender and nondistended. No masses, hepatosplenomegaly or hernias noted. Normal bowel sounds. Pulses:  Normal pulses noted. Extremities:  no edema Neurologic:  Alert and  oriented x4;  Skin:  Intact without significant lesions or rashes.. Normal color Psych:  Alert and cooperative. Normal mood and affect.   Impression & Recommendations:  Problem # 1:  DIARRHEA-PRESUMED INFECTIOUS (ICD-009.3) the patient's diarrheal illness appears to be metronidazole responsive. He may have had C. difficile or Giardia. In any event, doing better now. 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 I discussed this in detail with the patient and his wife. They had multiple questions and multiple ill conceived ideas regarding his diarrhea. I did the best I could to educate them.  Plan: #1. Expectant management #2. Activity yogurt #3. Return to the care of PCP.  Problem # 2:  PERSONAL HX COLONIC POLYPS (ICD-V12.72) March 2008 and [facility] with hyperplastic polyps only and hemorrhoidal banding. Apparently with adenomatous polyps prior to that exam, the documentation.  Plan: #1. Routine followup colonoscopy around March 2013  Problem # 3:  GERD (ICD-530.81) status post fundoplication. Asymptomatic  Patient Instructions: 1)  Please schedule a  follow-up appointment as needed.  2)  The medication list was reviewed and reconciled.  All changed / newly prescribed medications were explained.  A complete medication list was provided to the patient / caregiver. 3)  printed and given to patient. Milford Cage [facility]  April 09, 2010 2:11 PM

## 2011-01-15 NOTE — Assessment & Plan Note (Signed)
Summary: F1Y      Allergies Added:   Primary Provider:  Loreen Freud, DO   History of Present Illness: [name] is in for follow up.  His work has been quite stressful  (post office).  More is expected from them in general.  For two to three months has had some mild discomfort, which he describes as a stinging sensation.  It does not radiate.  It is very sporadic, and only occurs with high levels of exercise, or after a meal with exercise, or with stress.  NO definite pattern, nor it is progressive in its description, about which he is certain.  Arthritis is bad.  Current Medications (verified): 1)  Celebrex 200 Mg  Caps (Celecoxib) .Marland Kitchen.. 1 By Mouth Once Daily 2)  Valtrex 500 Mg Tabs (Valacyclovir Hcl) .... Take 1 Tablet By Mouth Twice A Day As Needed 3)  Tramadol Hcl 50 Mg Tabs (Tramadol Hcl) .... Take As Needed Pain 4)  Altace 5 Mg  Caps (Ramipril) .Marland Kitchen.. 1 By Mouth Two Times A Day 5)  Clonazepam 1 Mg  Tabs (Clonazepam) .Marland Kitchen.. 11/2 By Mouth 4 Days Every Week 6)  Adult Aspirin Low Strength 81 Mg  Tbdp (Aspirin) .Marland Kitchen.. 1 By Mouth Once Daily 7)  Toprol Xl 25 Mg  Tb24 (Metoprolol Succinate) .Marland Kitchen.. 1 By Mouth Once Daily 8)  Zanaflex 4 Mg Caps (Tizanidine Hcl) .Marland Kitchen.. 1 By Mouth Two Times A Day 9)  Pravachol 80 Mg  Tabs (Pravastatin Sodium) .Marland Kitchen.. 1 By Mouth At Bedtime 10)  Avodart 0.5 Mg  Caps (Dutasteride) .Marland Kitchen.. 1 By Mouth Once Daily 11)  Vicodin Es 7.5-750 Mg Tabs (Hydrocodone-Acetaminophen) .Marland Kitchen.. 1 By Mouth Every 6 Hours As Needed 12)  Pristiq 50 Mg Xr24h-Tab (Desvenlafaxine Succinate) .... Take One Tab By Mouth Once Daily 13)  Remeron 30 Mg Tabs (Mirtazapine) .Marland Kitchen.. 1 By Mouth At Night  Allergies (verified): 1)  ! Pcn  Past History:  Past Medical History: Last updated: 02/14/2010 Hyperlipidemia-- statin intolerance Hypertension Current Problems:  RIB PAIN, LEFT SIDED (ICD-786.50) INSOMNIA, PERSISTENT (ICD-307.42) HYPERTENSION (ICD-401.9) CAD (ICD-414.00) HYPERLIPIDEMIA (ICD-272.4) SYNCOPE  (ICD-780.2) NECK PAIN (ICD-723.1) OTITIS EXTERNA, RIGHT (ICD-380.10) GERD Adenomatous Colon Polyps  Past Surgical History: Last updated: 02/14/2010 Cervical laminectomy Hiatal Hernia surgery Heart bypass x 6  Family History: Last updated: 02/14/2010 Alcohilism-Mother No FH of Colon Cancer:  Vital Signs:  Patient profile:   60 year old male Height:      76.25 inches Weight:      191 pounds BMI:     23.18 Pulse rate:   55 / minute Resp:     16 per minute BP sitting:   128 / 84  (right arm)  Vitals Entered By: Marrion Coy, CNA (March 10, 2010 3:38 PM)  Physical Exam  General:  Well developed, well nourished, in no acute distress. Head:  normocephalic and atraumatic Eyes:  PERRLA/EOM intact; conjunctiva and lids normal. Chest Wall:  Median sternotomy healed Lungs:  Clear bilaterally to auscultation and percussion. Heart:  Non-displaced PMI, chest non-tender; regular rate and rhythm, S1, S2 without murmurs, rubs or gallops. Carotid upstroke normal, no bruit. Abdomen:  Bowel sounds positive; abdomen soft and non-tender without masses, organomegaly, or hernias noted. No hepatosplenomegaly. Extremities:  No clubbing or cyanosis. Neurologic:  Alert and oriented x 3.   EKG  Procedure date:  03/10/2010  Findings:      SB with first degree av block.  Minor non specific ST and T changes.   Cardiac Cath  Procedure date:  10/08/2006  Findings:       CORONARY ANGIOGRAPHY:  The LAD and left circumflex have separate ostia.  The  LAD was selective engaged and demonstrated a medium caliber vessel coursing  down to the left ventricular apex.  It gives off two diagonal branches.  One  very proximal diagonal branch has a small diameter and has an 80% stenosis  at its proximal portion.  The LAD has a 40% proximal stenosis and a mild  amount of obstructive disease throughout its course.  There is a 50%  stenosis of the ostial second diagonal which is a very small diameter   vessel.  There is no high-grade obstructive disease throughout the native  LAD.  There is no competitive flow seen in the native LAD.   The left circumflex is of medium caliber and heavily diseased.  It has 50%  disease in its mid portion.  It then becomes very ectatic and terminates in  a very small diameter vessel in the AV groove.  It gives off an obtuse  marginal branch that is 100% occluded.   The right coronary artery is occluded in its proximal portion.   The saphenous vein graft that is sequenced to the right coronary artery and  PDA is patent.  The PDA portion of the graft is occluded at the anastomosis.  There is a trickle of flow down in the atretic PDA seen on one view.  There  is contrast hung up in the distal portion of the PDA graft.  The limb that  is anastomosed to the distal right coronary artery is patent, and there is  flow seen in a small terminal portion of the right coronary artery that  gives off two posterior lateral branches.   The saphenous vein graft to the ramus intermedius is widely patent.  There  is no significant disease in the body of the vein graft, or in the native  ramus intermedius.   The saphenous vein graft to the obtuse marginal branches is patent.  There  is one limb of the sequenced graft that appears open with a small OM vessel  parted.  There is no high-grade disease at the OM.  This vein graft has an  approximate 50% stenosis in its proximal body.  I do not see a second obtu  Impression & Recommendations:  Problem # 1:  CAD, ARTERY BYPASS GRAFT (ICD-414.04) Has native disease, and SVG disease.  He remains clinically stable. Based on anatomy, some angina expected.  See cath report of Dr.Cooper from 2007.  Will continue to follow closely for any change.  Does not want cath now, and we discussed this. His updated medication list for this problem includes:    Altace 5 Mg Caps (Ramipril) .Marland Kitchen... 1 by mouth two times a day    Adult Aspirin Low  Strength 81 Mg Tbdp (Aspirin) .Marland Kitchen... 1 by mouth once daily    Toprol Xl 25 Mg Tb24 (Metoprolol succinate) .Marland Kitchen... 1 by mouth once daily  Orders: EKG w/ Interpretation (93000) TLB-Lipid Panel (80061-LIPID)  Problem # 2:  HYPERLIPIDEMIA (ICD-272.4) Will check lipid and liver profile at this point.  His updated medication list for this problem includes:    Pravachol 80 Mg Tabs (Pravastatin sodium) .Marland Kitchen... 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  Will follow annually.  Patient Instructions: 1)  Your physician recommends that  you schedule a follow-up appointment in: 2 MONTHS 2)  Your physician recommends that you have a FASTING lipid profile today. 3)  Your physician recommends that you continue on your current medications as directed. Please refer to the Current Medication list given to you today. Prescriptions: PRAVACHOL 80 MG  TABS (PRAVASTATIN SODIUM) 1 by mouth at bedtime  #90 x 3   Entered by:   Julieta Gutting, RN, BSN   Authorized by:   Ronaldo Miyamoto, MD, [facility]   Signed by:   Julieta Gutting, RN, BSN on 03/10/2010   Method used:   Electronically to        [facility]* (mail-order)             ,          Ph: [phone]       Fax: [phone]   RxID:   [id number] TOPROL XL 25 MG  TB24 (METOPROLOL SUCCINATE) 1 by mouth once daily  #90 x 3   Entered by:   Julieta Gutting, RN, BSN   Authorized by:   Ronaldo Miyamoto, MD, [facility]   Signed by:   Julieta Gutting, RN, BSN on 03/10/2010   Method used:   Electronically to        [facility]* (mail-order)             ,          Ph: [phone]       Fax: [phone]   RxID:   [id number] ALTACE 5 MG  CAPS (RAMIPRIL) 1 by mouth two times a day  #180 x 3   Entered by:   Julieta Gutting, RN, BSN   Authorized by:   Ronaldo Miyamoto, MD, [facility]   Signed by:   Julieta Gutting, RN, BSN on  03/10/2010   Method used:   Electronically to        [facility] [facility]* (mail-order)             ,          Ph: [phone]       Fax: [phone]   RxID:   [id number]

## 2011-01-15 NOTE — Assessment & Plan Note (Signed)
Summary: ANGINA--PT SPOKE TO KIM--WAS TOLD TO GO TO ER IF   Vital Signs:  Patient profile:   60 year old male Weight:      194.6 pounds Temp:     97.8 degrees F oral Pulse rate:   60 / minute Pulse rhythm:   irregular BP sitting:   126 / 72  (right arm) Cuff size:   regular  Vitals Entered By: Almeta Monas CMA Duncan Dull) (September 19, 2010 11:01 AM) CC: wants to discuss anxiety/meds and angina   History of Present Illness: Pt here c/o anxiety. Pt has been having chest pains since Tuesday--pt did not take nitroastat either time --he just took aspirin and pain went away in about in about 1 hour ---pt took aspirin wednesday again when he had chest pain and pain improved but did not go away.  Pt was sent home and has not been to work since.  Pt has also not had any chest pain since then.      Current Medications (verified): 1)  Valtrex 500 Mg Tabs (Valacyclovir Hcl) .... Take 1 Tablet By Mouth Twice A Day As Needed 2)  Tramadol Hcl 50 Mg Tabs (Tramadol Hcl) .... Take As Needed Pain 3)  Altace 5 Mg  Caps (Ramipril) .Marland Kitchen.. 1 By Mouth Two Times A Day 4)  Clonazepam 1 Mg  Tabs (Clonazepam) .Marland Kitchen.. 1 1/2 Tab Qpm 5)  Adult Aspirin Low Strength 81 Mg  Tbdp (Aspirin) .Marland Kitchen.. 1 By Mouth Once Daily 6)  Toprol Xl 25 Mg Xr24h-Tab (Metoprolol Succinate) .Marland Kitchen.. 1 Tab Two Times A Day 7)  Zanaflex 4 Mg Caps (Tizanidine Hcl) .... As Needed 8)  Pravachol 80 Mg  Tabs (Pravastatin Sodium) .Marland Kitchen.. 1 By Mouth At Bedtime 9)  Avodart 0.5 Mg  Caps (Dutasteride) .Marland Kitchen.. 1 By Mouth Once Daily 10)  Vicodin Es 7.5-750 Mg Tabs (Hydrocodone-Acetaminophen) .Marland Kitchen.. 1 By Mouth Every 6 Hours As Needed 11)  Remeron 30 Mg Tabs (Mirtazapine) .Marland Kitchen.. 1 By Mouth At Bedtime 12)  Eql Coq10 300 Mg Caps (Coenzyme Q10) .... Once A Day 13)  Tumeric 500mg  Cap .... Once A Day 14)  Omega-3 Krill Oil 300 Mg Caps (Krill Oil) .... 0nce A Day 15)  Nitrostat 0.4 Mg Subl (Nitroglycerin) .Marland Kitchen.. 1 Tablet Under Tongue At Onset of Chest Pain; You May Repeat Every 5  Minutes For Up To 3 Doses. 16)  Ativan 0.5 Mg Tabs (Lorazepam) .Marland Kitchen.. 1 By Mouth Three Times A Day As Needed  Allergies (verified): 1)  ! Pcn 2)  ! Imdur  Past History:  Past medical, surgical, family and social histories (including risk factors) reviewed for relevance to current acute and chronic problems.  Past Medical History: Reviewed history from 04/23/2010 and no changes required. 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 Cervical laminectomy Hiatal Hernia surgery Heart bypass x 6 cardiac cath 06/05/2010  Family History: Reviewed history from 03/05/2010 and no changes required. Alcohilism-Mother No FH of Colon Cancer:  Social History: Reviewed history from 03/05/2010 and no changes required. 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  Alcohol  Use - yes  Daily Caffeine Use Illicit Drug Use - no  Review of Systems      See HPI  Physical Exam  General:  Well-developed,well-nourished,in no acute distress; alert,appropriate and cooperative throughout examination Neck:  No deformities, masses, or tenderness noted. Lungs:  Normal respiratory effort, chest expands symmetrically. Lungs are clear to auscultation, no crackles or wheezes. Heart:  normal rate normal rate, no murmur, and Grade   /6 systolic ejection murmur.     Extremities:  No clubbing, cyanosis, edema, or deformity noted with normal full range of motion of all joints.   Psych:  Cognition and judgment appear intact. Alert and cooperative with normal attention span and concentration. No apparent delusions, illusions, hallucinations   Impression & Recommendations:  Problem # 1:  ANXIETY STATE, UNSPECIFIED (ICD-300.00)  His updated medication list for this problem includes:    Clonazepam 1 Mg Tabs (Clonazepam) .Marland Kitchen... 1 1/2 tab qpm    Remeron 30 Mg Tabs (Mirtazapine) .Marland Kitchen... 1 by mouth at bedtime    Ativan 0.5 Mg Tabs (Lorazepam) .Marland Kitchen... 1 by mouth three times a day as needed  Discussed medication use and relaxation techniques.   Orders: EKG w/ Interpretation (93000)  Problem # 2:  CAD, ARTERY BYPASS GRAFT (ICD-414.04)  His updated medication list for this problem includes:    Altace 5 Mg Caps (Ramipril) .Marland Kitchen... 1 by mouth two times a day    Adult Aspirin Low Strength 81 Mg Tbdp (Aspirin) .Marland Kitchen... 1 by mouth once daily    Toprol Xl 25 Mg Xr24h-tab (Metoprolol succinate) .Marland Kitchen... 1 tab two times a day    Nitrostat 0.4 Mg Subl (Nitroglycerin) .Marland Kitchen... 1 tablet under tongue at onset of chest pain; you may repeat every 5 minutes for up to 3 doses.  Complete Medication List: 1)  Valtrex 500 Mg Tabs (Valacyclovir hcl) .... Take 1 tablet by mouth twice a day as needed 2)  Tramadol Hcl 50 Mg Tabs (Tramadol hcl) .... Take as needed pain 3)  Altace 5 Mg Caps (Ramipril) .Marland Kitchen.. 1 by mouth two times a day 4)  Clonazepam 1 Mg Tabs (Clonazepam) .Marland Kitchen.. 1 1/2 tab qpm 5)  Adult Aspirin Low Strength 81 Mg Tbdp (Aspirin) .Marland Kitchen.. 1 by mouth once daily 6)  Toprol Xl 25 Mg Xr24h-tab (Metoprolol succinate) .Marland Kitchen.. 1 tab two times a day 7)  Zanaflex 4 Mg Caps (Tizanidine hcl) .... As needed 8)  Pravachol 80 Mg Tabs (Pravastatin sodium) .Marland Kitchen.. 1 by mouth at bedtime 9)  Avodart 0.5 Mg Caps (Dutasteride) .Marland Kitchen.. 1 by mouth once daily 10)  Vicodin Es 7.5-750 Mg Tabs (Hydrocodone-acetaminophen)  .Marland Kitchen.. 1 by mouth every 6 hours as needed 11)  Remeron 30 Mg Tabs (Mirtazapine) .Marland Kitchen.. 1 by mouth at bedtime 12)  Eql Coq10 300 Mg Caps (Coenzyme q10) .... Once a day 13)  Tumeric 500mg  Cap  .... Once a day 14)  Omega-3 Krill Oil 300 Mg Caps (Krill oil) .... 0nce a day 15)  Nitrostat 0.4 Mg Subl (Nitroglycerin) .Marland Kitchen.. 1 tablet under tongue at onset of chest pain; you may repeat every 5 minutes for up to 3 doses. 16)  Ativan 0.5 Mg Tabs (Lorazepam) .Marland Kitchen.. 1 by mouth three times a day as needed  Patient Instructions: 1)  Please schedule a follow-up appointment in 1 month.  Prescriptions: ATIVAN 0.5 MG TABS (LORAZEPAM) 1 by mouth three times a day as needed  #60 x 0   Entered and Authorized by:   Loreen Freud DO   Signed by:  Loreen Freud DO on 09/19/2010   Method used:   Print then Give to Patient   RxID:   [id number] CLONAZEPAM 1 MG  TABS (CLONAZEPAM) 1/2 tab in am then 1 1/2 tab qpm  #60 x 2   Entered and Authorized by:   Loreen Freud DO   Signed by:   Loreen Freud DO on 09/19/2010   Method used:   Print then Give to Patient   RxID:   [id number] REMERON 30 MG TABS (MIRTAZAPINE) 1 by mouth at bedtime  #90 x 3   Entered and Authorized by:   Loreen Freud DO   Signed by:   Loreen Freud DO on 09/19/2010   Method used:   Electronically to        [facility]  [address] [phone] (retail)       [address]       [place], [place]  [address]       Ph: [phone] or [phone]       Fax: [phone]   RxID:   [id number]   Appended Document: ANGINA--PT SPOKE TO KIM--WAS TOLD TO GO TO ER IF Pt understands that if chest pain returns he should go to ER.

## 2011-01-15 NOTE — Miscellaneous (Signed)
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 

## 2011-01-15 NOTE — Assessment & Plan Note (Signed)
Summary: sinus congestion and ear ache/cdj  Medications Added VALTREX 500 MG TABS (VALACYCLOVIR HCL) Take 1 tablet by mouth twice a day AMBIEN CR 12.5 MG TBCR (ZOLPIDEM TARTRATE) Take 1 tablet by mouth every night TIZANIDINE HCL 4 MG TABS (TIZANIDINE HCL)  TRAMADOL HCL 50 MG TABS (TRAMADOL HCL)  ALTACE 5 MG  CAPS (RAMIPRIL) 1 by mouth two times a day TRAZODONE HCL 100 MG  TABS (TRAZODONE HCL) 1 by mouth 3 NIGHTS EVERY WEEK CLONAZEPAM 1 MG  TABS (CLONAZEPAM) 1 by mouth 4 DAYS EVERY WEEK        Vital Signs:  Patient Profile:   60 Years Old Male Weight:      184.8 pounds Temp:     98.4 degrees F oral Pulse rate:   64 / minute BP sitting:   108 / 70  (left arm) Cuff size:   regular  Vitals Entered By: Shonna Chock (July 15, 2007 10:31 AM)               PCP:  Laury Axon  Chief Complaint:  RIGHT EARACHE, SINUS CONGESTION X 2-3 DAYS., and Ear pain.  History of Present Illness:  Ear Pain      This is a 60 year old man who presents with Ear pain.  The patient denies ear discharge, sensation of fullness, hearing loss, tinnitus, fever, sinus pain, nasal discharge, and jaw click.  The pain is located in the right ear.  The pain is described as intermittent.  The patient denies headache, night grinding of teeth, popping or crackling sounds, pressure, toothache, dizziness, and vertigo.  Prior treatment has included topical antibiotics.  ---  top. abx with relif but not gone .Marland Kitchen  He only had  days worth.       Review of Systems      See HPI   Physical Exam  General:     Well-developed,well-nourished,in no acute distress; alert,appropriate and cooperative throughout examination Ears:     R ear canal swollen,  Tm clear L ear normal Nose:     External nasal examination shows no deformity or inflammation. Nasal mucosa are pink and moist without lesions or exudates. Mouth:     Oral mucosa and oropharynx without lesions or exudates.  Teeth in good repair. Neck:     No deformities,  masses, or tenderness noted.    Impression & Recommendations:  Problem # 1:  OTITIS EXTERNA, RIGHT (ICD-380.10) floxin otic for 3-4 more days RTo prnDiscussed symptomatic treatment and preventive measures.   Complete Medication List: 1)  Celebrex 200 Mg Caps (Celecoxib) .Marland Kitchen.. 1 by mouth once daily 2)  Valtrex 500 Mg Tabs (Valacyclovir hcl) .... Take 1 tablet by mouth twice a day 3)  Ambien Cr 12.5 Mg Tbcr (Zolpidem tartrate) .... Take 1 tablet by mouth every night 4)  Tizanidine Hcl 4 Mg Tabs (Tizanidine hcl) 5)  Tramadol Hcl 50 Mg Tabs (Tramadol hcl) 6)  Altace 5 Mg Caps (Ramipril) .Marland Kitchen.. 1 by mouth two times a day 7)  Trazodone Hcl 100 Mg Tabs (Trazodone hcl) .Marland Kitchen.. 1 by mouth 3 nights every week 8)  Clonazepam 1 Mg Tabs (Clonazepam) .Marland Kitchen.. 1 by mouth 4 days every week

## 2011-01-15 NOTE — Medication Information (Signed)
Summary: Diphen/Atropine/[facility]  Diphen/Atropine/[facility]   Imported By: Sherian Rein 07/31/2010 12:28:09  _____________________________________________________________________  External Attachment:    Type:   Image     Comment:   External Document

## 2011-01-15 NOTE — Letter (Signed)
Summary: Results Follow up Letter   at [place]  [address] [place], [place] [address]   Phone: [phone]  Fax: [phone]    08/09/2007 MRN: [id number]  RUTILIO YELLOWHAIR [address] [place], [place]  [address]  Dear Mr. SCHWAGER,  The following are the results of your recent test(s):  Test         Result    Pap Smear:        Normal _____  Not Normal _____ Comments: ______________________________________________________ Cholesterol: LDL(Bad cholesterol):         Your goal is less than:         HDL (Good cholesterol):       Your goal is more than: Comments:  ______________________________________________________ Mammogram:        Normal _____  Not Normal _____ Comments:  ___________________________________________________________________ Hemoccult:        Normal _____  Not normal _______ Comments:    _____________________________________________________________________ Other Tests: VASCULAR LAB REPORT: NORMAL CAROTID ARTERIES   We routinely do not discuss normal results over the telephone.  If you desire a copy of the results, or you have any questions about this information we can discuss them at your next office visit.   Sincerely,

## 2011-01-15 NOTE — Letter (Signed)
Summary: Out of Work  [facility]  [address] [place], [place] [address]   Phone: [phone]  Fax: [phone]    January 23, 2010   Employee:  EJ PINSON    To Whom It May Concern:   For Medical reasons, please excuse the above named employee from work for the following dates:  Start:   January 23, 2010  End:   February 10 , 2011  If you need additional information, please feel free to contact our office.         Sincerely,    Loreen Freud DO

## 2011-01-15 NOTE — Letter (Signed)
Summary: External Other/PT NOTE  External Other/PT NOTE   Imported By: Job Founds 07/07/2007 14:05:13  _____________________________________________________________________  External Attachment:    Type:   Image     Comment:   External Document

## 2011-01-15 NOTE — Assessment & Plan Note (Signed)
Summary: acute only - back pain   Vital Signs:  Patient Profile:   60 Years Old Male Weight:      187.38 pounds Temp:     98.2 degrees F oral Pulse rate:   78 / minute Resp:     16 per minute BP sitting:   120 / 74  (right arm)  Pt. in pain?   no  Vitals Entered By: Ardyth Man (August 29, 2008 11:06 AM)                  PCP:  Laury Axon  Chief Complaint:  pain in left rear rib area since January or March and fell in January when he blacked out and also in March.  History of Present Illness: Pt here c/o Left rib pain since Jan and march.  Pt had blackout and cardio working that up.  Pt had bruise on L side mid back, back then.  He cannot lay on Left side.  No w/u of pain done.      Current Allergies: ! PCN  Past Medical History:    Reviewed history from 08/17/2007 and no changes required:       Hyperlipidemia-- statin intolerance       Hypertension       Current Problems:        RIB PAIN, LEFT SIDED (ICD-786.50)       INSOMNIA, PERSISTENT (ICD-307.42)       HYPERTENSION (ICD-401.9)       CAD (ICD-414.00)       HYPERLIPIDEMIA (ICD-272.4)       SYNCOPE (ICD-780.2)       NECK PAIN (ICD-723.1)       OTITIS EXTERNA, RIGHT (ICD-380.10)         Past Surgical History:    Reviewed history from 08/01/2007 and no changes required:       Cervical laminectomy     Review of Systems      See HPI   Physical Exam  General:     Well-developed,well-nourished,in no acute distress; alert,appropriate and cooperative throughout examination Lungs:     Normal respiratory effort, chest expands symmetrically. Lungs are clear to auscultation, no crackles or wheezes. Heart:     normal rate and regular rhythm.   Abdomen:     Bowel sounds positive,abdomen soft and non-tender without masses, organomegaly or hernias noted. Msk:     normal ROM, no joint tenderness, no joint swelling, no joint warmth, no redness over joints, no joint deformities, no joint instability, and no  crepitation.   Extremities:     No clubbing, cyanosis, edema, or deformity noted with normal full range of motion of all joints.   Neurologic:     alert & oriented X3 and strength normal in all extremities.   Skin:     Intact without suspicious lesions or rashes Psych:     Oriented X3, memory intact for recent and remote, and normally interactive.      Impression & Recommendations:  Problem # 1:  RIB PAIN, LEFT SIDED (ICD-786.50) rx vicodin and zanaflex Orders: T-Ribs Unilateral 2 Views (71100TC) T-2 View CXR (71020TC)   Complete Medication List: 1)  Celebrex 200 Mg Caps (Celecoxib) .Marland Kitchen.. 1 by mouth once daily 2)  Valtrex 500 Mg Tabs (Valacyclovir hcl) .... Take 1 tablet by mouth twice a day as needed 3)  Ambien Cr 12.5 Mg Tbcr (Zolpidem tartrate) .... Take 1 tablet by mouth every night 4)  Tramadol Hcl 50 Mg Tabs (Tramadol hcl) 5)  Altace 5 Mg Caps (Ramipril) .Marland Kitchen.. 1 by mouth two times a day 6)  Clonazepam 1 Mg Tabs (Clonazepam) .Marland Kitchen.. 1 by mouth 4 days every week 7)  Adult Aspirin Low Strength 81 Mg Tbdp (Aspirin) .Marland Kitchen.. 1 by mouth once daily 8)  Toprol Xl 25 Mg Tb24 (Metoprolol succinate) .Marland Kitchen.. 1 by mouth once daily 9)  Zanaflex 2 Mg Caps (Tizanidine hcl) .Marland Kitchen.. 1 by mouth as needed 10)  Pravachol 80 Mg Tabs (Pravastatin sodium) .Marland Kitchen.. 1 by mouth at bedtime 11)  Avodart 0.5 Mg Caps (Dutasteride) .Marland Kitchen.. 1 by mouth once daily 12)  Rozerem 8 Mg Tabs (Ramelteon) .... Take one tablet at bedtime 13)  Vicodin Es 7.5-750 Mg Tabs (Hydrocodone-acetaminophen) .Marland Kitchen.. 1 by mouth every 6 hours as needed    Prescriptions: ZANAFLEX 2 MG  CAPS (TIZANIDINE HCL) 1 by mouth as needed  #30 x 0   Entered and Authorized by:   Loreen Freud DO   Signed by:   Loreen Freud DO on 08/29/2008   Method used:   Print then Give to Patient   RxID:   [id number] VICODIN ES 7.5-750 MG TABS (HYDROCODONE-ACETAMINOPHEN) 1 by mouth every 6 hours as needed  #30 x 0   Entered and Authorized by:   Loreen Freud DO    Signed by:   Loreen Freud DO on 08/29/2008   Method used:   Print then Give to Patient   RxID:   [id number]  ] Laboratory Results   Urine Tests    Routine Urinalysis   Color: yellow Appearance: Clear Glucose: negative   (Normal Range: Negative) Bilirubin: negative   (Normal Range: Negative) Ketone: negative   (Normal Range: Negative) Spec. Gravity: <1.005   (Normal Range: 1.003-1.035) Blood: trace-lysed   (Normal Range: Negative) pH: 7.0   (Normal Range: 5.0-8.0) Protein: negative   (Normal Range: Negative) Urobilinogen: 0.2   (Normal Range: 0-1) Nitrite: negative   (Normal Range: Negative) Leukocyte Esterace: negative   (Normal Range: Negative)

## 2011-01-15 NOTE — Progress Notes (Signed)
Summary: FYI  Phone Note Call from Patient   Caller: Patient Call For: Loreen Freud DO Details for Reason: CP Summary of Call: Pt called a stated that he had been having chest pain today, said that he had a small blockage and doesn't think it is serious other that stress  from work right now. Adv pt the he we recommend he go to the hospital to make sure things have not gotten worse, Pt declined, sd that he didn't want to go through another Cath if he did not need it, said he would rather wait to see Dr.Lowne and get an rx for anxiety. I told pt I would not recommend him waiting and neither did Dr.Lowne but if he feels like he can wait until tomorrow, we will see him, he agreed, adv pt if he got worse to go straight to the ER by EMS, He agreed. Initial call taken by: Almeta Monas CMA Duncan Dull),  September 18, 2010 2:07 PM  Follow-up for Phone Call        noted Follow-up by: Loreen Freud DO,  September 18, 2010 2:12 PM

## 2011-01-15 NOTE — Progress Notes (Signed)
Summary: Sooner Appt.   Phone Note Call from Patient Call back at [phone]   Caller: spouse  Liborio Nixon Call For: Dr. Marina Goodell Reason for Call: Talk to Nurse Summary of Call: Pt has had diarrhea for "6 weeks" and wants a sooner appt. than 03-25-10 Initial call taken by: Karna Christmas,  February 12, 2010 1:08 PM  Follow-up for Phone Call        Has been under Dr.Lowne's care for this problem. Last time you saw pt was in 2002 and EGD was done. Should  I schedule pt. with Gunnar Fusi? Follow-up by: Teryl Lucy RN,  February 12, 2010 2:32 PM  Additional Follow-up for Phone Call Additional follow up Details #1::        yes Additional Follow-up by: Hilarie Fredrickson MD,  February 12, 2010 2:46 PM    Additional Follow-up for Phone Call Additional follow up Details #2::     Pt. scheduled with N.P. Follow-up by: Teryl Lucy RN,  February 12, 2010 3:17 PM

## 2011-01-15 NOTE — Procedures (Signed)
Summary: [facility]  [facility]   Imported By: Lester Weston 03/18/2010 10:39:57  _____________________________________________________________________  External Attachment:    Type:   Image     Comment:   External Document

## 2011-01-15 NOTE — Medication Information (Signed)
Summary: Prior Authorization for Celebrex/BCBS  Prior Authorization for Celebrex/BCBS   Imported By: Lanelle Bal 03/06/2009 08:43:26  _____________________________________________________________________  External Attachment:    Type:   Image     Comment:   External Document

## 2011-01-15 NOTE — Progress Notes (Signed)
Summary: PRIOR AUTH APPROVED FOR CELEBREX-BC/BS  Phone Note Refill Request   PRIOR AUTH APPROVED FOR CELEBREX FROM 04/20/08 TO 07/21/2008--BC/BS  Initial call taken by: Kandice Hams,  Apr 19, 2008 4:29 PM

## 2011-01-15 NOTE — Assessment & Plan Note (Signed)
Summary: COUGH/KN   Vital Signs:  Patient profile:   60 year old male Weight:      194.8 pounds O2 Sat:      97 % on Room air Temp:     98.2 degrees F oral Pulse rate:   63 / minute Pulse rhythm:   irregular BP sitting:   112 / 72  (left arm) Cuff size:   large  Vitals Entered By: Almeta Monas CMA Duncan Dull) (December 19, 2010 2:19 PM)  O2 Flow:  Room air CC: xfew days c/o cough with green sputum, headache, and sinus pressure, Cough   History of Present Illness:  Cough      This is a 60 year old man who presents with Cough.  The symptoms began 2 weeks ago.  The patient reports non-productive cough, shortness of breath, and wheezing, but denies productive cough, pleuritic chest pain, exertional dyspnea, fever, hemoptysis, and malaise.  Associated symtpoms include cold/URI symptoms, sore throat, and nasal congestion.  The cough is worse with activity and lying down.  Ineffective prior treatments have included OTC cough medication.    Current Medications (verified): 1)  Valtrex 500 Mg Tabs (Valacyclovir Hcl) .... Take 1 Tablet By Mouth Twice A Day As Needed 2)  Tramadol Hcl 50 Mg Tabs (Tramadol Hcl) .... Take As Needed Pain 3)  Altace 5 Mg  Caps (Ramipril) .Marland Kitchen.. 1 By Mouth Two Times A Day 4)  Clonazepam 1 Mg  Tabs (Clonazepam) .Marland Kitchen.. 1 1/2 Tab Qpm 5)  Adult Aspirin Low Strength 81 Mg  Tbdp (Aspirin) .Marland Kitchen.. 1 By Mouth Once Daily 6)  Toprol Xl 25 Mg Xr24h-Tab (Metoprolol Succinate) .Marland Kitchen.. 1 Tab Two Times A Day 7)  Zanaflex 4 Mg Caps (Tizanidine Hcl) .Marland Kitchen.. 1 By Mouth Two Times A Day 8)  Pravachol 80 Mg  Tabs (Pravastatin Sodium) .Marland Kitchen.. 1 By Mouth At Bedtime 9)  Avodart 0.5 Mg  Caps (Dutasteride) .Marland Kitchen.. 1 By Mouth Once Daily 10)  Vicodin Es 7.5-750 Mg Tabs (Hydrocodone-Acetaminophen) .Marland Kitchen.. 1 By Mouth Every 6 Hours As Needed 11)  Remeron 30 Mg Tabs (Mirtazapine) .Marland Kitchen.. 1 By Mouth At Bedtime 12)  Eql Coq10 300 Mg Caps (Coenzyme Q10) .... Once A Day 13)  Tumeric 500mg  Cap .... Once A Day 14)  Omega-3  Krill Oil 300 Mg Caps (Krill Oil) .... 0nce A Day 15)  Nitrostat 0.4 Mg Subl (Nitroglycerin) .Marland Kitchen.. 1 Tablet Under Tongue At Onset of Chest Pain; You May Repeat Every 5 Minutes For Up To 3 Doses. 16)  Ativan 0.5 Mg Tabs (Lorazepam) .Marland Kitchen.. 1 By Mouth Three Times A Day As Needed 17)  Cheratussin Ac 100-10 Mg/65ml Syrp (Guaifenesin-Codeine) .Marland Kitchen.. 1-2 Tsp By Mouth At Bedtime As Needed Cough 18)  Biaxin Xl Pac 500 Mg Xr24h-Tab (Clarithromycin) .... 2 By Mouth Once Daily  Allergies (verified): 1)  ! Pcn 2)  ! 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) DIARRHEA-PRESUMED INFECTIOUS (ICD-009.3) GERD (ICD-530.81) PERSONAL HX COLONIC POLYPS (ICD-V12.72) FEVER UNSPECIFIED (ICD-780.60) DIARRHEA (ICD-787.91) SHINGLES, HX OF (ICD-V13.8) PREVENTIVE HEALTH CARE (ICD-V70.0) ENCOUNTER FOR LONG-TERM USE OF OTHER MEDICATIONS (ICD-V58.69) RIB PAIN, LEFT SIDED (ICD-786.50) INSOMNIA, PERSISTENT (ICD-307.42) NECK PAIN (ICD-723.1) OTITIS EXTERNA, RIGHT (ICD-380.10)  Past Surgical History: Last updated: 06/26/2010 Cervical laminectomy Hiatal Hernia surgery Heart bypass x 6 cardiac cath 06/05/2010  Family History: Last updated: 03/05/2010 Alcohilism-Mother No FH of Colon Cancer:  Social History: Last  updated: 03/05/2010 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  Alcohol Use - yes  Daily Caffeine Use Illicit Drug Use - no  Risk Factors: Alcohol Use: 2 (06/26/2010) >5 drinks/d w/in last 3 months: no (06/26/2010) Caffeine Use: 2-3 (06/26/2010) Diet: well balanced (06/26/2010) Exercise: yes (06/26/2010)  Risk Factors: Smoking Status: quit (06/26/2010)  Family History: Reviewed history from 03/05/2010 and no changes required. Alcohilism-Mother No FH of Colon  Cancer:  Social History: Reviewed history from 03/05/2010 and no changes required. 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  Alcohol Use - yes  Daily Caffeine Use Illicit Drug Use - no  Review of Systems      See HPI  Physical Exam  General:  Well-developed,well-nourished,in no acute distress; alert,appropriate and cooperative throughout examination   Impression & Recommendations:  Problem # 1:  BRONCHITIS- ACUTE (ICD-466.0)  His updated medication list for this problem includes:    Cheratussin Ac 100-10 Mg/89ml Syrp (Guaifenesin-codeine) .Marland Kitchen... 1-2 tsp by mouth at bedtime as needed cough    Biaxin Xl Pac 500 Mg Xr24h-tab (Clarithromycin) .Marland Kitchen... 2 by mouth once daily  Take antibiotics and other medications as directed. Encouraged to push clear liquids, get enough rest, and take acetaminophen as needed. To be seen in 5-7 days if no improvement, sooner if worse.  Complete Medication List: 1)  Valtrex 500 Mg Tabs (Valacyclovir hcl) .... Take 1 tablet by mouth twice a day as needed 2)  Tramadol Hcl 50 Mg Tabs (Tramadol hcl) .... Take as needed pain 3)  Altace 5 Mg Caps (Ramipril) .Marland Kitchen.. 1 by mouth two times a day 4)  Clonazepam 1 Mg Tabs (Clonazepam) .Marland Kitchen.. 1 1/2 tab qpm 5)  Adult Aspirin Low Strength 81 Mg Tbdp (Aspirin) .Marland Kitchen.. 1 by mouth once daily 6)  Toprol Xl 25 Mg Xr24h-tab (Metoprolol succinate) .Marland Kitchen.. 1 tab two times a day 7)  Zanaflex 4 Mg Caps (Tizanidine hcl) .Marland Kitchen.. 1 by mouth two times a day 8)  Pravachol 80 Mg Tabs (Pravastatin sodium) .Marland Kitchen.. 1 by mouth at bedtime 9)  Avodart 0.5 Mg Caps (Dutasteride) .Marland Kitchen.. 1 by mouth once daily 10)  Vicodin Es 7.5-750 Mg Tabs (Hydrocodone-acetaminophen) .Marland Kitchen.. 1 by mouth every 6 hours as needed 11)  Remeron 30 Mg Tabs (Mirtazapine) .Marland Kitchen.. 1 by mouth at bedtime 12)  Eql Coq10 300 Mg Caps (Coenzyme q10) .... Once a day 13)  Tumeric 500mg  Cap  .... Once a day 14)  Omega-3 Krill Oil 300 Mg Caps (Krill oil) .... 0nce a day 15)   Nitrostat 0.4 Mg Subl (Nitroglycerin) .Marland Kitchen.. 1 tablet under tongue at onset of chest pain; you may repeat every 5 minutes for up to 3 doses. 16)  Ativan 0.5 Mg Tabs (Lorazepam) .Marland Kitchen.. 1 by mouth three times a day as needed 17)  Cheratussin Ac 100-10 Mg/39ml Syrp (Guaifenesin-codeine) .Marland Kitchen.. 1-2 tsp by mouth at bedtime as needed cough 18)  Biaxin Xl Pac 500 Mg Xr24h-tab (Clarithromycin) .... 2 by mouth once daily Prescriptions: BIAXIN XL PAC 500 MG XR24H-TAB (CLARITHROMYCIN) 2 by mouth once daily  #28 x 0   Entered and Authorized by:   Loreen Freud DO   Signed by:   Loreen Freud DO on 12/19/2010   Method used:   Electronically to        [facility]  [address] [phone] (retail)       [address]       [place]  [place], [place]  [address]       Ph: [phone] or [phone]       Fax: [phone]   RxID:   [id number] CHERATUSSIN AC 100-10 MG/5ML SYRP (GUAIFENESIN-CODEINE) 1-2 tsp by mouth at bedtime as needed cough  #6 oz x 0   Entered and Authorized by:   Loreen Freud DO   Signed by:   Loreen Freud DO on 12/19/2010   Method used:   Print then Give to Patient   RxID:   [id number]    Orders Added: 1)  Est. Patient Level III [23557]

## 2011-01-15 NOTE — Letter (Signed)
Summary: Fax with Patient Concerns  Fax with Patient Concerns   Imported By: Lanelle Bal 02/18/2010 12:53:30  _____________________________________________________________________  External Attachment:    Type:   Image     Comment:   External Document

## 2011-01-15 NOTE — Letter (Signed)
Summary: [facility]   Imported By: Lanelle Bal 06/19/2010 11:28:06  _____________________________________________________________________  External Attachment:    Type:   Image     Comment:   External Document

## 2011-01-15 NOTE — Progress Notes (Signed)
Summary: PRIOR AUTH APPROVED BCBS  Phone Note Refill Request   Refills Requested: Medication #1:  CELEBREX 200 MG  CAPS 1 by mouth once daily prior auth required. form completed and faxed to [facility]. awaiting response...............Marland KitchenFelecia Deloach CMA  February 21, 2009 11:49 AM   Follow-up for Phone Call        PRIOR AUTH APPROVED FROM 02-28-09 TO 02-28-10.......................Marland KitchenFelecia Deloach CMA  February 28, 2009 8:42 AM  pharmacy contacted in reguards to APPROVAL..........Marland KitchenFelecia Deloach CMA  February 28, 2009 3:41 PM

## 2011-01-15 NOTE — Progress Notes (Signed)
Summary: Refill req Ambien  Phone Note Call from Patient   Caller: [name] Details for Reason: Refill Ambien Summary of Call: Recd fax from pts wife requesting refill on Ambien--says he is not able to see Dr.  Antony Haste until  Oct 21,2008, need meds until then-- [facility] [place][phone].Marland KitchenMarland KitchenSp w/ pts wife, says Dr. Antony Haste will give rx once he is able to see the pt., if not precribe something else......................................................................Marland KitchenDaine Gip  September 07, 2007 12:45 PM Initial call taken by: Daine Gip,  September 07, 2007 12:45 PM  Follow-up for Phone Call        ok to refill ambien---  # 15 only 1 by mouth at bedtime  Follow-up by: Loreen Freud DO,  September 07, 2007 5:30 PM      Prescriptions: AMBIEN CR 12.5 MG TBCR (ZOLPIDEM TARTRATE) Take 1 tablet by mouth every night  #15 x 0   Entered by:   Shary Decamp   Authorized by:   Loreen Freud DO   Signed by:   Shary Decamp on 09/08/2007   Method used:   Printed then faxed to ...       [id number] [address]       [address]       [place], [place]  [address]       Ph: [phone] or [phone]       Fax: [phone]   RxID:   [id number]

## 2011-01-15 NOTE — Medication Information (Signed)
Summary: [facility] [employer]   [facility] [employer]   Imported By: Freddy Jaksch 04/24/2008 14:21:06  _____________________________________________________________________  External Attachment:    Type:   Image     Comment:   External Document

## 2011-01-15 NOTE — Progress Notes (Signed)
Summary: Xifaxan samples at front desk   Phone Note Outgoing Call   Call placed by: Joselyn Glassman,  Apr 24, 2010 10:30 AM Call placed to: Patient Summary of Call: Advised pt that I put the 18 tablets of Xifaxan samples at the front desk for him.  He needed 18 more tablets for his course of Xifaxan. ( The rep brought what we needed

## 2011-01-15 NOTE — Assessment & Plan Note (Signed)
Summary: COUGH, COLD AND FEVER///SPH   Vital Signs:  Patient profile:   60 year old male Height:      76.25 inches Weight:      193 pounds BMI:     23.42 O2 Sat:      98 % on Room air Temp:     98.2 degrees F oral Pulse rate:   68 / minute Pulse rhythm:   regular BP sitting:   140 / 82  (left arm) Cuff size:   regular  Vitals Entered By: Army Fossa CMA (January 23, 2010 11:38 AM)  O2 Flow:  Room air CC: Pt c/o nausea, loss stool, and has had a fever. (Pt changed his Zanaflex to 4mg  on his own) , Diarrhea   History of Present Illness:  Diarrhea      This is a 60 year old man who presents with Diarrhea.  The symptoms began 4-8 weeks ago.  Pt c/o 5 weeks now.   It started off like cold with Upper resp congestion and he went to ER  about  1 week ago and he was put on z pack.  Pt was having diarrhea before z pack.  Pt now only c/o fever, nausea and diarrhea.  The patient complains of 4-6 stools per day and watery/unformed stools.  Associated symptoms include fever and nausea.  The symptoms are worse with any food and any liquid.  Patient's risk factors for diarrhea include recent antibiotic use.    Current Medications (verified): 1)  Celebrex 200 Mg  Caps (Celecoxib) .Marland Kitchen.. 1 By Mouth Once Daily 2)  Valtrex 500 Mg Tabs (Valacyclovir Hcl) .... Take 1 Tablet By Mouth Twice A Day As Needed 3)  Tramadol Hcl 50 Mg Tabs (Tramadol Hcl) 4)  Altace 5 Mg  Caps (Ramipril) .Marland Kitchen.. 1 By Mouth Two Times A Day 5)  Clonazepam 1 Mg  Tabs (Clonazepam) .Marland Kitchen.. 11/2 By Mouth 4 Days Every Week 6)  Adult Aspirin Low Strength 81 Mg  Tbdp (Aspirin) .Marland Kitchen.. 1 By Mouth Once Daily 7)  Toprol Xl 25 Mg  Tb24 (Metoprolol Succinate) .Marland Kitchen.. 1 By Mouth Once Daily 8)  Zanaflex 4 Mg Caps (Tizanidine Hcl) .Marland Kitchen.. 1 By Mouth Two Times A Day 9)  Pravachol 80 Mg  Tabs (Pravastatin Sodium) .Marland Kitchen.. 1 By Mouth At Bedtime 10)  Avodart 0.5 Mg  Caps (Dutasteride) .Marland Kitchen.. 1 By Mouth Once Daily 11)  Vicodin Es 7.5-750 Mg Tabs  (Hydrocodone-Acetaminophen) .Marland Kitchen.. 1 By Mouth Every 6 Hours As Needed 12)  Pristiq 50 Mg Xr24h-Tab (Desvenlafaxine Succinate) .... Take One Tab By Mouth Once Daily 13)  Remeron 30 Mg Tabs (Mirtazapine) .Marland Kitchen.. 1 By Mouth At Night 14)  Flagyl 500 Mg Tabs (Metronidazole) .Marland Kitchen.. 1 By Mouth Three Times A Day  Allergies: 1)  ! Pcn  Past History:  Past medical, surgical, family and social histories (including risk factors) reviewed for relevance to current acute and chronic problems.  Past Medical History: Reviewed history from 08/29/2008 and no changes required. Hyperlipidemia-- statin intolerance Hypertension Current Problems:  RIB PAIN, LEFT SIDED (ICD-786.50) INSOMNIA, PERSISTENT (ICD-307.42) HYPERTENSION (ICD-401.9) CAD (ICD-414.00) HYPERLIPIDEMIA (ICD-272.4) SYNCOPE (ICD-780.2) NECK PAIN (ICD-723.1) OTITIS EXTERNA, RIGHT (ICD-380.10)  Past Surgical History: Reviewed history from 08/01/2007 and no changes required. Cervical laminectomy  Family History: Reviewed history and no changes required.  Social History: Reviewed history and no changes required.  Review of Systems      See HPI  Physical Exam  General:  Well-developed,well-nourished,in no acute distress; alert,appropriate and cooperative throughout examination  Mouth:  Oral mucosa and oropharynx without lesions or exudates.  Teeth in good repair. Lungs:  Normal respiratory effort, chest expands symmetrically. Lungs are clear to auscultation, no crackles or wheezes. Abdomen:  Bowel sounds positive,abdomen soft and non-tender without masses, organomegaly or hernias noted. Psych:  Oriented X3 and normally interactive.     Impression & Recommendations:  Problem # 1:  DIARRHEA (ICD-787.91)  Orders: Venipuncture (25366) TLB-BMP (Basic Metabolic Panel-BMET) (80048-METABOL) TLB-CBC Platelet - w/Differential (85025-CBCD) TLB-TSH (Thyroid Stimulating Hormone) (44034-VQQ) TLB-Hepatic/Liver Function Pnl  (80076-HEPATIC) T-Culture, Stool (87045/87046-70140) T-Culture, C-Diff Toxin A/B ([id number])  Discussed symptom control and diet. Call if worsening of symptoms or signs of dehydration.   Problem # 2:  FEVER UNSPECIFIED (ICD-780.60)  Orders: Venipuncture (33295) TLB-BMP (Basic Metabolic Panel-BMET) (80048-METABOL) TLB-CBC Platelet - w/Differential (85025-CBCD) TLB-TSH (Thyroid Stimulating Hormone) (18841-YSA) TLB-Hepatic/Liver Function Pnl (80076-HEPATIC) T-Culture, Stool (87045/[id number]) T-Culture, C-Diff Toxin A/B ([id number])  Discussed fever control and symptomatic treatment.   Complete Medication List: 1)  Celebrex 200 Mg Caps (Celecoxib) .Marland Kitchen.. 1 by mouth once daily 2)  Valtrex 500 Mg Tabs (Valacyclovir hcl) .... Take 1 tablet by mouth twice a day as needed 3)  Tramadol Hcl 50 Mg Tabs (Tramadol hcl) 4)  Altace 5 Mg Caps (Ramipril) .Marland Kitchen.. 1 by mouth two times a day 5)  Clonazepam 1 Mg Tabs (Clonazepam) .Marland Kitchen.. 11/2 by mouth 4 days every week 6)  Adult Aspirin Low Strength 81 Mg Tbdp (Aspirin) .Marland Kitchen.. 1 by mouth once daily 7)  Toprol Xl 25 Mg Tb24 (Metoprolol succinate) .Marland Kitchen.. 1 by mouth once daily 8)  Zanaflex 4 Mg Caps (Tizanidine hcl) .Marland Kitchen.. 1 by mouth two times a day 9)  Pravachol 80 Mg Tabs (Pravastatin sodium) .Marland Kitchen.. 1 by mouth at bedtime 10)  Avodart 0.5 Mg Caps (Dutasteride) .Marland Kitchen.. 1 by mouth once daily 11)  Vicodin Es 7.5-750 Mg Tabs (Hydrocodone-acetaminophen) .Marland Kitchen.. 1 by mouth every 6 hours as needed 12)  Pristiq 50 Mg Xr24h-tab (Desvenlafaxine succinate) .... Take one tab by mouth once daily 13)  Remeron 30 Mg Tabs (Mirtazapine) .Marland Kitchen.. 1 by mouth at night 14)  Flagyl 500 Mg Tabs (Metronidazole) .Marland Kitchen.. 1 by mouth three times a day Prescriptions: ZANAFLEX 4 MG CAPS (TIZANIDINE HCL) 1 by mouth two times a day  #180 x 0   Entered and Authorized by:   Loreen Freud DO   Signed by:   Loreen Freud DO on 01/23/2010   Method used:   Electronically to        [facility]* (mail-order)              ,          Ph: [phone]       Fax: [phone]   RxID:   [id number] FLAGYL 500 MG TABS (METRONIDAZOLE) 1 by mouth three times a day  #30 x 0   Entered and Authorized by:   Loreen Freud DO   Signed by:   Loreen Freud DO on 01/23/2010   Method used:   Electronically to        [facility]  [address] [phone]* (retail)       [address]       [place], [place]  [address]       Ph: [phone] or [phone]       Fax: [phone]   RxID:   [id number]

## 2011-01-15 NOTE — Progress Notes (Signed)
Summary: avodart refill--[facility]  Phone Note Refill Request Call back at [phone] Message from:  Pharmacy [facility]  Refills Requested: Medication #1:  AVODART 0.5 MG  CAPS 1 by mouth once daily   Notes: qty 3  with 2 refill Initial call taken by: Kandice Hams,  September 18, 2008 8:47 AM      Prescriptions: AVODART 0.5 MG  CAPS (DUTASTERIDE) 1 by mouth once daily  #90 x 2   Entered by:   Kandice Hams   Authorized by:   Loreen Freud DO   Signed by:   Kandice Hams on 09/18/2008   Method used:   Faxed to ...       [facility]* (mail-order)             ,          Ph: [phone]       Fax: [phone]   RxID:   [id number]

## 2011-01-15 NOTE — Procedures (Signed)
Summary: Manometry                    [place]  Patient:    [name], [name]                         MRN: [id number] Proc. Date: 03/28/01 Adm. Date:  [id number] Disc. Date: [id number] Attending:  Estella Husk CC:         [place] Daphine Deutscher, M.D.   Procedure Report  PROCEDURE:  Esophageal manometry.  HISTORY:  This is a 60 year old gentleman with gastroesophageal reflux disease, who has been evaluated recently for symptoms refractory to medical therapy.  He sought alternative therapies and is for consideration of laparoscopic Nissen fundoplication.  As part of his preoperative evaluation, he is for esophageal manometry.  DESCRIPTION OF PROCEDURE:  Patient reported to the [facility] March 28, 2001.  Manometry was performed in the standard manner.  The findings were as follows:  1. The upper esophageal sphincter demonstrated normal coordination and    relaxation. 2. The esophageal body demonstrated normal peristalsis as manifested by normal    wave amplitude and normal wave propagation. 3. The lower esophageal sphincter was hypotensive with a resting pressure of    8 mmHg.  However, normal relaxation with wet swallowing was demonstrated.  IMPRESSIVE:  Hypotensive lower esophageal sphincter pressure consistent with known reflux disease.  Otherwise normal manometry.  RECOMMENDATIONS:  Laparoscopic Nissen fundoplication per Dr. Daphine Deutscher. DD:  04/06/01 TD:  04/07/01 Job: [id number]

## 2011-01-15 NOTE — Progress Notes (Signed)
Summary: needs an   Phone Note Call from Patient   Caller: Spouse Reason for Call: Acute Illness Summary of Call: dr. Laury Axon [phone] needs an apt today, husband is having sinus problems and ear ache Initial call taken by: Charolette Child,  July 14, 2007 10:47 AM  Follow-up for Phone Call        pt coming tomorrow please disregard urine results was doing example Follow-up by: Doristine Devoid,  July 14, 2007 12:24 PM         Laboratory Results   Urine Tests    Routine Urinalysis   Glucose: negative   (Normal Range: Negative) Bilirubin: negative   (Normal Range: Negative) Ketone: negative   (Normal Range: Negative) Blood: negative   (Normal Range: Negative) Protein: negative   (Normal Range: Negative) Urobilinogen: negative   (Normal Range: 0-1) Nitrite: negative   (Normal Range: Negative) Leukocyte Esterace: negative   (Normal Range: Negative)

## 2011-01-15 NOTE — Assessment & Plan Note (Signed)
Summary: hosp f/u / heart pressure/cbs   Vital Signs:  Patient profile:   60 year old male Height:      76.5 inches Weight:      190 pounds Temp:     97.3 degrees F oral Pulse rate:   68 / minute BP sitting:   140 / 98  (left arm)  Vitals Entered By: Jeremy Johann CMA (June 26, 2010 9:22 AM) CC: HOSP F/U POSSIBLE HEART ATTACK Comments REVIEWED MED LIST, PATIENT AGREED DOSE AND INSTRUCTION CORRECT    History of Present Illness: Pt here f/u hospital for ? MI---Pt had Chest Pain and elevated bp on 6/23---cpk, troponins negative.  Pt Metoprolol was increased to 50 mg and he was put on Imdur  but Imdur caused a headache so he stopped it and he thought the 50 mg toprol caused diarrhea so he went back down to 25mg .  He has not informed Dr [name].   He has f/u with him next week.  Pt states he has had angina 2-3 x the first day back at work,  Friday after he was d/cd from hospital and has had no chest pain since.    Preventive Screening-Counseling & Management  Alcohol-Tobacco     Alcohol drinks/day: 2     Alcohol type: wine     >5/day in last 3 mos: no     Smoking Status: quit     Year Quit: 1997  Caffeine-Diet-Exercise     Caffeine use/day: 2-3     Caffeine Counseling: decrease use of caffeine     Diet Comments: well balanced     Does Patient Exercise: yes     Type of exercise: walking     Exercise (avg: min/session): <30     Times/week: 5  Current Medications (verified): 1)  Celebrex 200 Mg  Caps (Celecoxib) .Marland Kitchen.. 1 By Mouth Once Daily 2)  Valtrex 500 Mg Tabs (Valacyclovir Hcl) .... Take 1 Tablet By Mouth Twice A Day As Needed 3)  Tramadol Hcl 50 Mg Tabs (Tramadol Hcl) .... Take As Needed Pain 4)  Altace 5 Mg  Caps (Ramipril) .Marland Kitchen.. 1 By Mouth Two Times A Day 5)  Clonazepam 1 Mg  Tabs (Clonazepam) .... As Needed 6)  Adult Aspirin Low Strength 81 Mg  Tbdp (Aspirin) .Marland Kitchen.. 1 By Mouth Once Daily 7)  Toprol Xl 50 Mg Xr24h-Tab (Metoprolol Succinate) .Marland Kitchen.. 1 By Mouth Once Daily 8)   Zanaflex 4 Mg Caps (Tizanidine Hcl) .... As Needed 9)  Pravachol 80 Mg  Tabs (Pravastatin Sodium) .Marland Kitchen.. 1 By Mouth At Bedtime 10)  Avodart 0.5 Mg  Caps (Dutasteride) .Marland Kitchen.. 1 By Mouth Once Daily 11)  Vicodin Es 7.5-750 Mg Tabs (Hydrocodone-Acetaminophen) .Marland Kitchen.. 1 By Mouth Every 6 Hours As Needed 12)  Pristiq 50 Mg Xr24h-Tab (Desvenlafaxine Succinate) .... Take One Tab By Mouth Once Daily 13)  Remeron 30 Mg Tabs (Mirtazapine) .... As Needed 14)  Eql Coq10 300 Mg Caps (Coenzyme Q10) .... Once A Day 15)  Tumeric 500mg  Cap .... Once A Day 16)  Omega-3 Krill Oil 300 Mg Caps (Krill Oil) .... 0nce A Day  Allergies: 1)  ! Pcn  Past History:  Past medical, surgical, family and social histories (including risk factors) reviewed for relevance to current acute and chronic problems.  Past Medical History: Reviewed history from 04/23/2010 and no changes required. Adenomatous Colon Polyps CAD, ARTERY BYPASS GRAFT (ICD-414.04) HYPERLIPIDEMIA (ICD-272.4) SYNCOPE (ICD-780.2) HYPERTENSION (ICD-401.9) ABDOMINAL AORTIC ANEURYSM (ICD-441.4) DIARRHEA-PRESUMED INFECTIOUS (ICD-009.3) GERD (ICD-530.81) PERSONAL HX COLONIC  POLYPS (ICD-V12.72) FEVER UNSPECIFIED (ICD-780.60) DIARRHEA (ICD-787.91) SHINGLES, HX OF (ICD-V13.8) PREVENTIVE HEALTH CARE (ICD-V70.0) ENCOUNTER FOR LONG-TERM USE OF OTHER MEDICATIONS (ICD-V58.69) RIB PAIN, LEFT SIDED (ICD-786.50) INSOMNIA, PERSISTENT (ICD-307.42) NECK PAIN (ICD-723.1) OTITIS EXTERNA, RIGHT (ICD-380.10)  Past Surgical History: Cervical laminectomy Hiatal Hernia surgery Heart bypass x 6 cardiac cath 06/05/2010  Family History: Reviewed history from 03/05/2010 and no changes required. Alcohilism-Mother No FH of Colon Cancer:  Social History: Reviewed history from 03/05/2010 and no changes required. 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  Alcohol Use - yes  Daily Caffeine Use Illicit Drug Use - no  Review of Systems      See  HPI  Physical Exam  General:  Well-developed,well-nourished,in no acute distress; alert,appropriate and cooperative throughout examination Neck:  No deformities, masses, or tenderness noted. Lungs:  Normal respiratory effort, chest expands symmetrically. Lungs are clear to auscultation, no crackles or wheezes. Heart:  normal rate and no murmur.   Extremities:  No clubbing, cyanosis, edema, or deformity noted with normal full range of motion of all joints.   Psych:  Oriented X3, normally interactive, good eye contact, not anxious appearing, and not depressed appearing.     Impression & Recommendations:  Problem # 1:  HYPERTENSION (ICD-401.9)  Pt was only taking 25 toprol-----he will try to increase it again His updated medication list for this problem includes:    Altace 5 Mg Caps (Ramipril) .Marland Kitchen... 1 by mouth two times a day    Toprol Xl 50 Mg Xr24h-tab (Metoprolol succinate) .Marland Kitchen... 1 by mouth once daily  BP today: 140/98 Prior BP: 128/84 (05/19/2010)  Labs Reviewed: K+: 4.3 (05/19/2010) Creat: : 0.9 (05/19/2010)   Chol: 155 (05/19/2010)   HDL: 35.10 (05/19/2010)   LDL: 123 (02/11/2009)   TG: 291.0 (05/19/2010)  Problem # 2:  ANXIETY STATE, UNSPECIFIED (ICD-300.00)  pt would like to come off pristiq The following medications were removed from the medication list:    Pristiq 50 Mg Xr24h-tab (Desvenlafaxine succinate) .Marland Kitchen... Take one tab by mouth once daily His updated medication list for this problem includes:    Clonazepam 1 Mg Tabs (Clonazepam) .Marland Kitchen... As needed    Remeron 30 Mg Tabs (Mirtazapine) .Marland Kitchen... As needed  Discussed medication use and relaxation techniques.   Problem # 3:  CAD, ARTERY BYPASS GRAFT (ICD-414.04)  Pt stopped Imdur secondary to HA His updated medication list for this problem includes:    Altace 5 Mg Caps (Ramipril) .Marland Kitchen... 1 by mouth two times a day    Adult Aspirin Low Strength 81 Mg Tbdp (Aspirin) .Marland Kitchen... 1 by mouth once daily    Toprol Xl 50 Mg Xr24h-tab  (Metoprolol succinate) .Marland Kitchen... 1 by mouth once daily  Labs Reviewed: Chol: 155 (05/19/2010)   HDL: 35.10 (05/19/2010)   LDL: 123 (02/11/2009)   TG: 291.0 (05/19/2010)  Problem # 4:  HYPERLIPIDEMIA (ICD-272.4) per cardiology His updated medication list for this problem includes:    Pravachol 80 Mg Tabs (Pravastatin sodium) .Marland Kitchen... 1 by mouth at bedtime  Labs Reviewed: SGOT: 24 (01/23/2010)   SGPT: 22 (01/23/2010)   HDL:35.10 (05/19/2010), 42.60 (03/10/2010)  LDL:123 (02/11/2009), 96 (04/54/0981)  Chol:155 (05/19/2010), 206 (03/10/2010)  Trig:291.0 (05/19/2010), 291.0 (03/10/2010)  Complete Medication List: 1)  Celebrex 200 Mg Caps (Celecoxib) .Marland Kitchen.. 1 by mouth once daily 2)  Valtrex 500 Mg Tabs (Valacyclovir hcl) .... Take 1 tablet by mouth twice a day as needed 3)  Tramadol Hcl 50 Mg Tabs (Tramadol hcl) .... Take as  needed pain 4)  Altace 5 Mg Caps (Ramipril) .Marland Kitchen.. 1 by mouth two times a day 5)  Clonazepam 1 Mg Tabs (Clonazepam) .... As needed 6)  Adult Aspirin Low Strength 81 Mg Tbdp (Aspirin) .Marland Kitchen.. 1 by mouth once daily 7)  Toprol Xl 50 Mg Xr24h-tab (Metoprolol succinate) .Marland Kitchen.. 1 by mouth once daily 8)  Zanaflex 4 Mg Caps (Tizanidine hcl) .... As needed 9)  Pravachol 80 Mg Tabs (Pravastatin sodium) .Marland Kitchen.. 1 by mouth at bedtime 10)  Avodart 0.5 Mg Caps (Dutasteride) .Marland Kitchen.. 1 by mouth once daily 11)  Vicodin Es 7.5-750 Mg Tabs (Hydrocodone-acetaminophen) .Marland Kitchen.. 1 by mouth every 6 hours as needed 12)  Remeron 30 Mg Tabs (Mirtazapine) .... As needed 13)  Eql Coq10 300 Mg Caps (Coenzyme q10) .... Once a day 14)  Tumeric 500mg  Cap  .... Once a day 15)  Omega-3 Krill Oil 300 Mg Caps (Krill oil) .... 0nce a day  Other Orders: Zoster (Shingles) Vaccine Live [phone]) Admin 1st Vaccine (60454) Admin 1st Vaccine (State) [phone])   Zostavax # 1    Vaccine Type: Zostavax    Site: RIGHT [place]    Mfr: Merck    Dose: 0.65ML    Route: IM    Given by: Jeremy Johann CMA    Exp. Date: 07/09/2011    Lot #:  [id number]    VIS given: 09/25/05 given June 26, 2010.

## 2011-01-15 NOTE — Letter (Signed)
Summary: [facility]  [facility]   Imported By: Lanelle Bal 11/19/2010 14:22:21  _____________________________________________________________________  External Attachment:    Type:   Image     Comment:   External Document

## 2011-01-15 NOTE — Assessment & Plan Note (Signed)
Summary: f36m  Medications Added CLONAZEPAM 1 MG  TABS (CLONAZEPAM) as needed ZANAFLEX 4 MG CAPS (TIZANIDINE HCL) as needed REMERON 30 MG TABS (MIRTAZAPINE) as needed EQL COQ10 300 MG CAPS (COENZYME Q10) once a day * TUMERIC 500MG  CAP once a day OMEGA-3 KRILL OIL 300 MG CAPS (KRILL OIL) 0nce a day        Visit Type:  2 months follow up Referring Provider:  n/a Primary Provider:  Loreen Freud, DO  CC:  One episode of chest pain since last visit.  History of Present Illness: Had only one day of discomfort since last visit.  The episode felt like bad gas, and could not push it out.  Did not really get short of breath much, and did not break out in a sweat.  It went away on its own.  Has been doing alot of things including gardening, mowing, walking to work without symptoms.  His work facility is large, and gets no chest pain with exertion.  He works from one end to another.  Here for follow up on lipids.    Current Medications (verified): 1)  Celebrex 200 Mg  Caps (Celecoxib) .Marland Kitchen.. 1 By Mouth Once Daily 2)  Valtrex 500 Mg Tabs (Valacyclovir Hcl) .... Take 1 Tablet By Mouth Twice A Day As Needed 3)  Tramadol Hcl 50 Mg Tabs (Tramadol Hcl) .... Take As Needed Pain 4)  Altace 5 Mg  Caps (Ramipril) .Marland Kitchen.. 1 By Mouth Two Times A Day 5)  Clonazepam 1 Mg  Tabs (Clonazepam) .... As Needed 6)  Adult Aspirin Low Strength 81 Mg  Tbdp (Aspirin) .Marland Kitchen.. 1 By Mouth Once Daily 7)  Toprol Xl 25 Mg  Tb24 (Metoprolol Succinate) .Marland Kitchen.. 1 By Mouth Once Daily 8)  Zanaflex 4 Mg Caps (Tizanidine Hcl) .... As Needed 9)  Pravachol 80 Mg  Tabs (Pravastatin Sodium) .Marland Kitchen.. 1 By Mouth At Bedtime 10)  Avodart 0.5 Mg  Caps (Dutasteride) .Marland Kitchen.. 1 By Mouth Once Daily 11)  Vicodin Es 7.5-750 Mg Tabs (Hydrocodone-Acetaminophen) .Marland Kitchen.. 1 By Mouth Every 6 Hours As Needed 12)  Pristiq 50 Mg Xr24h-Tab (Desvenlafaxine Succinate) .... Take One Tab By Mouth Once Daily 13)  Remeron 30 Mg Tabs (Mirtazapine) .... As Needed 14)  Metronidazole  250 Mg Tabs (Metronidazole) .... Take 1 Tab 4 Times Daily X 1 Month 15)  Levbid 0.375 Mg Xr12h-Tab (Hyoscyamine Sulfate) .... Take 1 Tab Twice Daily As Needed For Cramping, Spasms 16)  Eql Coq10 300 Mg Caps (Coenzyme Q10) .... Once A Day 17)  Tumeric 500mg  Cap .... Once A Day 18)  Omega-3 Krill Oil 300 Mg Caps (Krill Oil) .... 0nce A Day  Allergies: 1)  ! Pcn  Vital Signs:  Patient profile:   60 year old male Height:      76.5 inches Weight:      193 pounds BMI:     23.27 Pulse rate:   61 / minute Pulse rhythm:   regular Resp:     18 per minute BP sitting:   128 / 84  (left arm) Cuff size:   large  Vitals Entered By: Vikki Ports (May 19, 2010 11:19 AM)  Physical Exam  General:  Well developed, well nourished, in no acute distress. Head:  normocephalic and atraumatic Eyes:  PERRLA/EOM intact; conjunctiva and lids normal. Lungs:  Clear bilaterally to auscultation and percussion. Heart:  PMI non displaced.  Normal S1 and S2.  No murmur Pulses:  pulses normal in all 4 extremities Extremities:  No  clubbing or cyanosis.   Impression & Recommendations:  Problem # 1:  CAD, ARTERY BYPASS GRAFT (ICD-414.04) very limited symptoms since last visit.   Only one episode and not clearly angina.  His updated medication list for this problem includes:    Altace 5 Mg Caps (Ramipril) .Marland Kitchen... 1 by mouth two times a day    Adult Aspirin Low Strength 81 Mg Tbdp (Aspirin) .Marland Kitchen... 1 by mouth once daily    Toprol Xl 25 Mg Tb24 (Metoprolol succinate) .Marland Kitchen... 1 by mouth once daily  Orders: TLB-Lipid Panel (80061-LIPID) EKG w/ Interpretation (93000) TLB-BMP (Basic Metabolic Panel-BMET) (80048-METABOL)  Problem # 2:  HYPERLIPIDEMIA (ICD-272.4)  Will recheck lipid and liver.  Tri elevated and patient on fish oil.  Will accept a 70-100 range on LDL.  His updated medication list for this problem includes:    Pravachol 80 Mg Tabs (Pravastatin sodium) .Marland Kitchen... 1 by mouth at bedtime  Orders: TLB-Lipid  Panel (80061-LIPID) EKG w/ Interpretation (93000)  His updated medication list for this problem includes:    Pravachol 80 Mg Tabs (Pravastatin sodium) .Marland Kitchen... 1 by mouth at bedtime  Problem # 3:  HYPERTENSION (ICD-401.9) will check BMET since on Celebrex and Altace, with some diahreaa from Norvo virus.  His updated medication list for this problem includes:    Altace 5 Mg Caps (Ramipril) .Marland Kitchen... 1 by mouth two times a day    Adult Aspirin Low Strength 81 Mg Tbdp (Aspirin) .Marland Kitchen... 1 by mouth once daily    Toprol Xl 25 Mg Tb24 (Metoprolol succinate) .Marland Kitchen... 1 by mouth once daily  His updated medication list for this problem includes:    Altace 5 Mg Caps (Ramipril) .Marland Kitchen... 1 by mouth two times a day    Adult Aspirin Low Strength 81 Mg Tbdp (Aspirin) .Marland Kitchen... 1 by mouth once daily    Toprol Xl 25 Mg Tb24 (Metoprolol succinate) .Marland Kitchen... 1 by mouth once daily  Patient Instructions: 1)  Your physician recommends that you have a FASTING lipid profile today.  2)  Your physician recommends that you continue on your current medications as directed. Please refer to the Current Medication list given to you today. 3)  Your physician wants you to follow-up in:  4 MONTHS.  You will receive a reminder letter in the mail two months in advance. If you don't receive a letter, please call our office to schedule the follow-up appointment. 4)  Your physician recommends that you have lab work today: BMP

## 2011-01-15 NOTE — Progress Notes (Signed)
Summary: per fax  Phone Note Call from Patient   Summary of Call: Pt sent in a fax stating he had finished all his medicaitons and was having diarrhea 3-5 times a day and this is the 3rd month he has had it.Pt would like to know if he should see Dr.Lowne or Dr.Perry. Per Dr. Laury Axon Pt needs to get Align OTC and needs to call Dr.Perry and get an appt. I made pts wife aware of this. Army Fossa CMA  February 12, 2010 2:05 PM

## 2011-01-15 NOTE — Procedures (Signed)
Summary: Anoscopy/[facility]  Anoscopy/[facility]   Imported By: Lester Elba 03/18/2010 10:41:11  _____________________________________________________________________  External Attachment:    Type:   Image     Comment:   External Document

## 2011-01-15 NOTE — Letter (Signed)
Summary: Out of Work  [facility] [employer]  [address] [place], [place] [address]   Phone: [phone]  Fax: [phone]    December 19, 2010   Employee:  [name]    To Whom It May Concern:   For Medical reasons, please excuse the above named employee from work for the following dates:  Start:   12/19/2010  End:   12/20/2010  If you need additional information, please feel free to contact our office.         Sincerely,    Loreen Freud DO

## 2011-01-15 NOTE — Progress Notes (Signed)
Summary: Verify medication  Phone Note From Pharmacy Call back at [phone]   Caller: [facility] REF# [phone] Summary of Call: Need to verify  Metoprolol need call back within 48 hours Initial call taken by: Judie Grieve,  May 20, 2010 12:49 PM  Follow-up for Phone Call        I spoke with the pharmacist Tawanna Cooler and he said they received a Rx for Toprol XL 50mg  on 05/10/10 and then a Rx for Toprol XL 25mg  on 05/13/10.  Tawanna Cooler said both of these were prescribed by Dr [name].  I told Tawanna Cooler that in our computer system we only show that a Rx was sent on 05/13/10 for Toprol XL 25mg .  While Tawanna Cooler was looking in his system he noticed that the 05/10/10 Rx had been voided.  I made Todd aware that we did see this pt yesterday for a routine OV and his medication list shows Toprol XL 25mg  once a day.  Follow-up by: Julieta Gutting, RN, BSN,  May 20, 2010 1:02 PM

## 2011-01-15 NOTE — Op Note (Signed)
Summary: Operative Report                         [facility]  Patient:    [name]                         MRN: [id number] Proc. Date: 04/22/01 Adm. Date:  [id number] Attending:  Katha Cabal CC:         Angelena Sole, M.D. [facility]  Wilhemina Bonito. Eda Keys., M.D. [facility]   Operative Report  PREOPERATIVE DIAGNOSIS:  Gastroesophageal reflux disease.  POSTOPERATIVE DIAGNOSES:  Small hiatal hernia with gastroesophageal reflux disease.  PROCEDURE:  Laparoscopic Nissen fundoplication over a #56 dilator with closure of the crura (3).  SURGEON:  Thornton Park. Daphine Deutscher, M.D.  ASSISTANT:  Angelia Mould. Derrell Lolling, M.D.  PREOPERATIVE INDICATIONS:  [name] is a 60 year old man, who has had reflux since he was a teenager, when he initially managed it with Tums.  Recently he has become refractory to medications, despite taking Nexium 40 mg twice a day. He was seen and evaluated thoroughly by Dr. Yancey Flemings, who has endoscoped him and who has done esophageal manometry showing basically a hypotensive lower esophageal sphincter but otherwise a normal peristaltic propagation.  DESCRIPTION OF PROCEDURE:  [name] was taken to room one on Friday, Apr 22, 2001, and after general anesthesia, the abdomen was prepped with Betadine and draped sterilely.  A supraumbilical longitudinal incision was made, through which a pursestring suture, the Hasson cannula was introduced.  The abdomen was insufflated.  A 5 mm port was placed in the upper midline to retract the liver, two 10-11s on the right side and one on the left enabled [place] to perform the procedure.  As depicted in the chart, you could see his hiatal hernia which is readily demonstrated in the photographs.  It was moderate sized. This was reduced as I incised the peritoneal reflection and then dissected the right crus followed by the esophagogastric junction and then followed by the left crus.  I subsequently was able to identify the posterior  vagus and then behind that, was a large fat pad that seemed to be coming from behind the stomach.  I ended up dissecting that free and removing it.  This was depicted in the chart also.  After a good dissection of the esophagogastric junction with mobilization of both crura, I then repaired the hiatal hernia with three sutures using the EndoStitch posteriorly which snugged this up nicely.  The 56 lighted dilator was then passed and then with that in place, the wrap was brought around.  A contiguous portion of the stomach was used to actually create the invagination of the esophagus by the proximal cardia and fundus. The fundoplication was sutured to the esophagus superiorly with an extra corporeal tie being placed followed by two intracorporeal sutures to complete the wrap.  All of these knots had Endo Clips placed on them.  I will mention that I did mobilize the cardia nicely, and we did take down the short gastrics and mobilized these off the spleen.  No bleeding was encountered.  At the completion, a nice, healthy wrap was present.  There were no bleeding points or injuries noted.  All port sites were inspected.  The Hasson port was tied down under direct videoscopic visualization.  The wounds were injected with Marcaine.  AFter the abdomen was deflated, the skin was closed with 4-0  Vicryl subcutaneously with Benzoin and Steri-Strips on the skin.  The patient seemed to tolerate the procedure well, and he was taken to the recovery room in satisfactory condition.  He was admitted for observation. DD:  04/22/01 TD:  04/23/01 Job: [id number]

## 2011-01-15 NOTE — Progress Notes (Signed)
Summary: diarrhea persists  Medications Added METRONIDAZOLE 250 MG TABS (METRONIDAZOLE) Take 1 p.o  q.i.d. x 2 weeks LEVBID 0.375 MG XR12H-TAB (HYOSCYAMINE SULFATE) Take 1 p.o. b.i.d. x 2 weeks       Phone Note Call from Patient   Summary of Call: Pt's . wife called and said pt's diarrhea is no better.It started in feb. and he was seen by Dr.Lowne. and stool studies were negative.Gunnar Fusi saw pt  02/14/10.Says  the diarrhea has not slowed down. Wife says as soon as pt. eats regardless of what it is he has immediate urgency  with loose stools.  Denies abd. pain and no blood or mucus seen in stools.Feels his color is poor. Asking if he needs repeat colon due to the persistent diarrhea and what can be done for his symptoms? Initial call taken by: Teryl Lucy RN,  March 18, 2010 2:04 PM  Follow-up for Phone Call        #1. Retrieved with metronidazole 250 mg q.i.d. x2 weeks #2. Levbid 0.375 mg b.i.d. x2 weeks #3. Office followup in 3-4 weeks. If symptoms persist at that time, we will indeed consider colonoscopy. Follow-up by: Hilarie Fredrickson MD,  March 18, 2010 2:15 PM    Additional Follow-up for Phone Call Additional follow up Details #2::    Ntfd. of Dr.Perry's orders.Rx sent to pharmacy. Follow-up by: Teryl Lucy RN,  March 18, 2010 2:29 PM  New/Updated Medications: METRONIDAZOLE 250 MG TABS (METRONIDAZOLE) Take 1 p.o  q.i.d. x 2 weeks LEVBID 0.375 MG XR12H-TAB (HYOSCYAMINE SULFATE) Take 1 p.o. b.i.d. x 2 weeks Prescriptions: LEVBID 0.375 MG XR12H-TAB (HYOSCYAMINE SULFATE) Take 1 p.o. b.i.d. x 2 weeks  #28 x 0   Entered by:   Teryl Lucy RN   Authorized by:   Hilarie Fredrickson MD   Signed by:   Teryl Lucy RN on 03/18/2010   Method used:   Electronically to        [facility]  [address] [phone][facility] (retail)       [address]       [place], [place]  [address]       Ph: [phone] or [phone]       Fax: [phone]   RxID:   [id number] METRONIDAZOLE 250 MG TABS  (METRONIDAZOLE) Take 1 p.o  q.i.d. x 2 weeks  #56 x 0   Entered by:   Teryl Lucy RN   Authorized by:   Hilarie Fredrickson MD   Signed by:   Teryl Lucy RN on 03/18/2010   Method used:   [facility]  [address] [phone][facility] (retail[facility]       [address]       [place], [place]  [address]       Ph: [phone] or [phone]       Fax: [phone]   RxID:   [id number]

## 2011-01-15 NOTE — Letter (Signed)
Summary: Fax from Patient Regarding Meds  Fax from Patient Regarding Meds   Imported By: Lanelle Bal 11/29/2010 09:17:51  _____________________________________________________________________  External Attachment:    Type:   Image     Comment:   External Document

## 2011-01-15 NOTE — Assessment & Plan Note (Signed)
Summary: recurrent diarrhea-Dr.Perry pt.    History of Present Illness Visit Type: Follow-up Visit Primary GI MD: Yancey Flemings MD Primary Provider: Loreen Freud, DO Requesting Provider: n/a Chief Complaint: recurrent diarrhea 2-4 per day right after eating or drinking History of Present Illness:   [name] is back for third time with diarrhea. PCP treated him with Flagyl in Feb/ 2010 and diarrhea resolved. Labs and stool for C&S and C-diff negative x1 at that time. Wife called 03/18/10 stating patient had recurrent diarrhea. He was retreated with two week course of Flagyl and Levbid and diarrhea resolved. Patient saw Dr. Marina Goodell in follow up 4/27 and was doing fine. Yesterday patient's wife called in reporting husband's recurrent diarrhea and this is reason for his visit today. Patient is accompanied by his wife. Patient responds to Flagyl but then relapses 5-6 days after completion of medication. He is having 2-4 loose stools a day depending on how many times a day he eats. No associated cramps except for when he takes Flagyl. Patient is eating yogurt, avoiding dairy products and artifical sweetners. Trying to eat high fiber foods.  No weight loss.    Has severe problems sleeping. Had to sell house as passing trucks on highway were waking him up. He tried numerous medications for sleep but all caused diarrhea except Pristiq and Remeron.    GI Review of Systems    Reports bloating.      Denies abdominal pain, acid reflux, belching, chest pain, dysphagia with liquids, dysphagia with solids, heartburn, loss of appetite, nausea, vomiting, vomiting blood, weight loss, and  weight gain.      Reports diarrhea.     Denies anal fissure, black tarry stools, change in bowel habit, constipation, diverticulosis, fecal incontinence, heme positive stool, hemorrhoids, irritable bowel syndrome, jaundice, light color stool, liver problems, rectal bleeding, and  rectal pain.    Current Medications (verified): 1)   Celebrex 200 Mg  Caps (Celecoxib) .Marland Kitchen.. 1 By Mouth Once Daily 2)  Valtrex 500 Mg Tabs (Valacyclovir Hcl) .... Take 1 Tablet By Mouth Twice A Day As Needed 3)  Tramadol Hcl 50 Mg Tabs (Tramadol Hcl) .... Take As Needed Pain 4)  Altace 5 Mg  Caps (Ramipril) .Marland Kitchen.. 1 By Mouth Two Times A Day 5)  Clonazepam 1 Mg  Tabs (Clonazepam) .Marland Kitchen.. 11/2 By Mouth 4 Days Every Week 6)  Adult Aspirin Low Strength 81 Mg  Tbdp (Aspirin) .Marland Kitchen.. 1 By Mouth Once Daily 7)  Toprol Xl 25 Mg  Tb24 (Metoprolol Succinate) .Marland Kitchen.. 1 By Mouth Once Daily 8)  Zanaflex 4 Mg Caps (Tizanidine Hcl) .Marland Kitchen.. 1 By Mouth Two Times A Day 9)  Pravachol 80 Mg  Tabs (Pravastatin Sodium) .Marland Kitchen.. 1 By Mouth At Bedtime 10)  Avodart 0.5 Mg  Caps (Dutasteride) .Marland Kitchen.. 1 By Mouth Once Daily 11)  Vicodin Es 7.5-750 Mg Tabs (Hydrocodone-Acetaminophen) .Marland Kitchen.. 1 By Mouth Every 6 Hours As Needed 12)  Pristiq 50 Mg Xr24h-Tab (Desvenlafaxine Succinate) .... Take One Tab By Mouth Once Daily 13)  Remeron 30 Mg Tabs (Mirtazapine) .Marland Kitchen.. 1 By Mouth At Night  Allergies (verified): 1)  ! Pcn  Past History:  Past Medical History: Adenomatous Colon Polyps CAD, ARTERY BYPASS GRAFT (ICD-414.04) HYPERLIPIDEMIA (ICD-272.4) SYNCOPE (ICD-780.2) HYPERTENSION (ICD-401.9) ABDOMINAL AORTIC ANEURYSM (ICD-441.4) DIARRHEA-PRESUMED INFECTIOUS (ICD-009.3) GERD (ICD-530.81) PERSONAL HX COLONIC POLYPS (ICD-V12.72) FEVER UNSPECIFIED (ICD-780.60) DIARRHEA (ICD-787.91) SHINGLES, HX OF (ICD-V13.8) PREVENTIVE HEALTH CARE (ICD-V70.0) ENCOUNTER FOR LONG-TERM USE OF OTHER MEDICATIONS (ICD-V58.69) RIB PAIN, LEFT SIDED (ICD-786.50) INSOMNIA, PERSISTENT (ICD-307.42) NECK  PAIN (ICD-723.1) OTITIS EXTERNA, RIGHT (ICD-380.10)  Past Surgical History: Reviewed history from 03/05/2010 and no changes required. Cervical laminectomy Hiatal Hernia surgery Heart bypass x 6  Family History: Reviewed history from 03/05/2010 and no changes required. Alcohilism-Mother No FH of Colon  Cancer:  Social History: Reviewed history from 03/05/2010 and no changes required. 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  Alcohol Use - yes  Daily Caffeine Use Illicit Drug Use - no  Review of Systems  The patient denies allergy/sinus, anemia, anxiety-new, arthritis/joint pain, back pain, blood in urine, breast changes/lumps, change in vision, confusion, cough, coughing up blood, depression-new, fainting, fatigue, fever, headaches-new, hearing problems, heart murmur, heart rhythm changes, itching, muscle pains/cramps, night sweats, nosebleeds, shortness of breath, skin rash, sleeping problems, sore throat, swelling of feet/legs, swollen lymph glands, thirst - excessive, urination - excessive, urination changes/pain, urine leakage, vision changes, and voice change.    Vital Signs:  Patient profile:   60 year old male Height:      76.5 inches Weight:      190.6 pounds BMI:     22.98 Pulse rate:   64 / minute Pulse rhythm:   regular BP sitting:   122 / 80  (left arm)  Vitals Entered By: Milford Cage NCMA (Apr 23, 2010 8:22 AM)  Physical Exam  General:  Well developed, well nourished, no acute distress. Head:  Normocephalic and atraumatic. Eyes:  Conjunctiva pink, no icterus.  Mouth:  No oral lesions. Tongue moist.  Lungs:  Clear throughout to auscultation. Heart:  Regular rate and rhythm; no murmurs, rubs,  or bruits. Abdomen:  Abdomen soft, nontender, nondistended. No obvious masses or hepatomegaly.Normal bowel sounds.  Msk:  Symmetrical with no gross deformities. Normal posture. Involuntary right facial twitching  Neurologic:  Alert and  oriented x4;  grossly normal neurologically. Skin:  Intact without significant lesions or rashes. Psych:  Alert and cooperative. Normal mood and affect.   Impression & Recommendations:  Problem # 1:  DIARRHEA (ICD-787.91) Assessment Deteriorated Recurrent.  Responds to Flagyl but then relapses within few  days. Stools loose but only 2-4 times a day. Possibilities are several including, but not limited to IBS, medications, microscopic colitis. I cannot explain why diarrhea would respond to Flagyl for the latter two however. Will check for celiac. Check stool for lactoferrin which would suggest inflammatory diarrhea. Trial of Xifaxan 550mg  one three times a day for 14 days,  samples given.  If workup negative and symptoms persist will possibly need flexible sigmoidoscopy with random colon biopsies.  Patient looks fine and hasn't had any weight loss. May take Lomotil for relief while we sort things out. Additionally, recommend low residue diet for time being.  Orders: T-Tissue Transglutamase Ab IgA [phone]) TLB-IgA (Immunoglobulin A) ([id number]) T-Fecal WBC ([id number])  Problem # 2:  CAD, ARTERY BYPASS GRAFT (ICD-414.04) Assessment: Comment Only  Problem # 3:  ABNORMAL INVOLUNTARY MOVEMENTS (ICD-781.0) Assessment: Comment Only Right facial twitching. Patient tells me this has been present for years. Most pronounced when upset, nervous.   Patient Instructions: 1)  Your physician has requested that you have the following labwork done today:  2)  We have given you samples of Xifaxan, take 1 tab 3 times daily x 14 days.  We have given you 24 tablets and  will call you when the Drug rep comes today or tomorrow with more samples. 3)  We have faxed a perscription for Lomotil to [facility] [address]  [place]. 4)  Low Residue Diet brochure given. 5)  Copy sent to : Loreen Freud, DO 6)  The medication list was reviewed and reconciled.  All changed / newly prescribed medications were explained.  A complete medication list was provided to the patient / caregiver. Prescriptions: LOMOTIL 2.5-0.025 MG TABS (DIPHENOXYLATE-ATROPINE) Take 1 tab twice daily  #60 x 0   Entered by:   Lowry Ram NCMA   Authorized by:   Willette Cluster NP   Signed by:   Lowry Ram NCMA on 04/23/2010   Method used:    Printed then faxed to ...       [facility]  [address] [phone][facility]ail[facility]       [address]       [place], [place]  [address]       Ph: [phone] or [phone]       Fax: [phone]   RxID:   [id number]

## 2011-01-15 NOTE — Miscellaneous (Signed)
Summary: Orders Update  Clinical Lists Changes  Orders: Added new Referral order of Neurosurgeon Referral (Neurosurgeon) - Signed 

## 2011-01-15 NOTE — Letter (Signed)
Summary: Fax Concerning Referral to [facility]  Fax Concerning Referral to [facility]   Imported By: Lanelle Bal 04/17/2009 13:13:53  _____________________________________________________________________  External Attachment:    Type:   Image     Comment:   External Document

## 2011-01-16 NOTE — Letter (Signed)
Summary: [facility]   Imported By: Lanelle Bal 08/26/2009 13:03:37  _____________________________________________________________________  External Attachment:    Type:   Image     Comment:   External Document

## 2011-02-03 ENCOUNTER — Telehealth: Payer: Self-pay | Admitting: Family Medicine

## 2011-02-09 ENCOUNTER — Telehealth: Payer: Self-pay | Admitting: Cardiology

## 2011-02-10 ENCOUNTER — Encounter: Payer: Self-pay | Admitting: Physician Assistant

## 2011-02-10 ENCOUNTER — Ambulatory Visit (INDEPENDENT_AMBULATORY_CARE_PROVIDER_SITE_OTHER): Payer: Federal, State, Local not specified - PPO | Admitting: Physician Assistant

## 2011-02-10 DIAGNOSIS — I2510 Atherosclerotic heart disease of native coronary artery without angina pectoris: Secondary | ICD-10-CM

## 2011-02-10 DIAGNOSIS — R079 Chest pain, unspecified: Secondary | ICD-10-CM

## 2011-02-10 NOTE — Progress Notes (Signed)
Summary: Med refill  Phone Note Refill Request Message from:  fax from spouse on February 03, 2011 12:40 PM  Refills Requested: Medication #1:  AVODART 0.5 MG  CAPS 1 by mouth once daily   Last Refilled: 09/18/2008 Fax states to please send to [facility], #90 and 3 refills. Spouse cell # [phone]  Next Appointment Scheduled: 03.05.12 Initial call taken by: Lucious Groves CMA,  February 03, 2011 12:41 PM  Follow-up for Phone Call        pt last seen 12/19/10 and filled 09/18/08.Marland KitchenMarland Kitchenplease advise Follow-up by: Almeta Monas CMA Duncan Dull),  February 03, 2011 1:43 PM  Additional Follow-up for Phone Call Additional follow up Details #1::        ok to fill 90 days with 3 refills Additional Follow-up by: Loreen Freud DO,  February 03, 2011 1:45 PM    Prescriptions: AVODART 0.5 MG  CAPS (DUTASTERIDE) 1 by mouth once daily  #90 x 3   Entered by:   Almeta Monas CMA (AAMA)   Authorized by:   Loreen Freud DO   Signed by:   Almeta Monas CMA (AAMA) on 02/03/2011   Method used:   Faxed to ...       [facility]* (mail-order)       [address] [place], [place]  [address]       Ph: [phone]       Fax: [phone]   RxID:   [id number]

## 2011-02-12 ENCOUNTER — Telehealth (INDEPENDENT_AMBULATORY_CARE_PROVIDER_SITE_OTHER): Payer: Self-pay | Admitting: Radiology

## 2011-02-16 ENCOUNTER — Encounter: Payer: Self-pay | Admitting: Family Medicine

## 2011-02-16 ENCOUNTER — Encounter: Payer: Self-pay | Admitting: Cardiovascular Disease

## 2011-02-16 ENCOUNTER — Encounter: Payer: Self-pay | Admitting: Internal Medicine

## 2011-02-16 ENCOUNTER — Ambulatory Visit (HOSPITAL_COMMUNITY): Payer: Federal, State, Local not specified - PPO | Attending: Cardiology

## 2011-02-16 DIAGNOSIS — R0602 Shortness of breath: Secondary | ICD-10-CM | POA: Insufficient documentation

## 2011-02-16 DIAGNOSIS — I2510 Atherosclerotic heart disease of native coronary artery without angina pectoris: Secondary | ICD-10-CM

## 2011-02-16 DIAGNOSIS — R079 Chest pain, unspecified: Secondary | ICD-10-CM

## 2011-02-17 ENCOUNTER — Encounter: Payer: Self-pay | Admitting: Family Medicine

## 2011-02-18 ENCOUNTER — Telehealth (INDEPENDENT_AMBULATORY_CARE_PROVIDER_SITE_OTHER): Payer: Self-pay | Admitting: *Deleted

## 2011-02-18 ENCOUNTER — Encounter: Payer: Self-pay | Admitting: Family Medicine

## 2011-02-19 ENCOUNTER — Encounter: Payer: Self-pay | Admitting: Family Medicine

## 2011-02-19 NOTE — Progress Notes (Signed)
Summary: nuc pre procedure  Phone Note Outgoing Call Call back at [facility] Phone [phone]   Call placed by: Stanton Kidney, EMT-P,  February 12, 2011 11:04 AM Action Taken: Phone Call Completed Reason for Call: Confirm/change Appt Summary of Call: Left message with information on [employer] Information Sheet (see scanned document for details). Stanton Kidney, EMT-P  February 12, 2011 11:05 AM      Nuclear Med Background Indications for Stress Test: Evaluation for Ischemia, Graft Patency   History: CABG, Echo, GXT, Heart Catheterization, Myocardial Infarction, Myocardial Perfusion Study  History Comments: '02 MI /Cabg; '07 GXT -Nml.; '09 Echo EF=60-65% ; MPS -EF=60% slight isch. inf. wall  Symptoms: Chest Pain, Diaphoresis, Nausea, SOB  Symptoms Comments: Hx. syncope   Nuclear Pre-Procedure Cardiac Risk Factors: History of Smoking, Hypertension, Lipids Height (in): 76.5

## 2011-02-19 NOTE — Assessment & Plan Note (Signed)
Summary: f/u angina/lwb   Visit Type:  Follow-up Referring Provider:  n/a Primary Provider:  Loreen Freud, DO  CC:  no complaints .  History of Present Illness: Primary Cardiologist:  Dr. Shawnie Pons  [name] is a 60 yo male with a history of CAD, status post bypass surgery in 2002, HTN, HLP, GERD and anxiety.  He has a small AAA measuring 2.8 x 2.8 cm in 12/2009.  His last cardiac catheterization was done June 2011 and this demonstrated proximal to mid LAD 40% and mid to distal LAD 30-40%; D1 90%, D2 70-80%; proximal circumflex 40%; AV circumflex 50%, distal 80%; RCA occluded; ramus intermediate occluded; OM1 occluded, OM2 patent. 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  His S-RI was ok.  The S-OM1/OM2 had an occluded 1st limb but otherwise patent with 50% prox and diffuse 30-40%.  S-PDA/RCA y-graft was patent.  EF 45-50%.  Echo in 2009 EF 60-65%.  He presents today with recent complaints of angina.  He has a fairly physically demanding job.  Three nights ago, before starting his shift, he developed left-sided chest discomfort.  He describes it as angina.  He took nitroglycerin and 3 baby aspirin.  He took nitroglycerin 2 other times 15 minutes apart.  He also took 2 more baby aspirin.  His symptoms finally subsided.  He was mildly short of breath and noticed possibly some nausea and diaphoresis.  He states that he has had this about 2-3 times since January.  It is always at rest.  He denies any exertional chest discomfort.  He denies any chest discomfort with doing his job.  He denies syncope.  He denies orthopnea, PND or pedal edema.  He does have a history of GERD.  He has a history of Nissen fundoplication.  He denies any dyspepsia, belching or dysphagia.  Current Medications (verified): 1)  Valtrex 500 Mg Tabs (Valacyclovir Hcl) .... Take 1 Tablet By Mouth Twice A Day As Needed 2)  Tramadol Hcl 50 Mg Tabs (Tramadol Hcl) .... Take As Needed Pain 3)  Altace 5 Mg  Caps (Ramipril)  .Marland Kitchen.. 1 By Mouth Two Times A Day 4)  Clonazepam 1 Mg  Tabs (Clonazepam) .Marland Kitchen.. 1 1/2 Tab Qpm 5)  Adult Aspirin Low Strength 81 Mg  Tbdp (Aspirin) .Marland Kitchen.. 1 By Mouth Once Daily 6)  Toprol Xl 25 Mg Xr24h-Tab (Metoprolol Succinate) .Marland Kitchen.. 1 Tab Two Times A Day 7)  Zanaflex 4 Mg Caps (Tizanidine Hcl) .... As Needed 8)  Pravachol 80 Mg  Tabs (Pravastatin Sodium) .Marland Kitchen.. 1 By Mouth At Bedtime 9)  Avodart 0.5 Mg  Caps (Dutasteride) .Marland Kitchen.. 1 By Mouth Once Daily 10)  Vicodin Es 7.5-750 Mg Tabs (Hydrocodone-Acetaminophen) .Marland Kitchen.. 1 By Mouth Every 6 Hours As Needed 11)  Remeron 30 Mg Tabs (Mirtazapine) .Marland Kitchen.. 1 By Mouth At Bedtime 12)  Eql Coq10 300 Mg Caps (Coenzyme Q10) .... Once A Day 13)  Tumeric 500mg  Cap .... Once A Day 14)  Omega-3 Krill Oil 300 Mg Caps (Krill Oil) .... 0nce A Day 15)  Nitrostat 0.4 Mg Subl (Nitroglycerin) .Marland Kitchen.. 1 Tablet Under Tongue At Onset of Chest Pain; You May Repeat Every 5 Minutes For Up To 3 Doses. 16)  Ativan 0.5 Mg Tabs (Lorazepam) .Marland Kitchen.. 1 By Mouth Three Times A Day As Needed  Allergies (verified): 1)  ! Pcn 2)  ! Imdur  Past History:  Past Medical History: Reviewed history from 04/23/2010 and no changes required. Adenomatous Colon Polyps CAD, ARTERY BYPASS GRAFT (ICD-414.04) HYPERLIPIDEMIA (  ICD-272.4) SYNCOPE (ICD-780.2) HYPERTENSION (ICD-401.9) ABDOMINAL AORTIC ANEURYSM (ICD-441.4) DIARRHEA-PRESUMED INFECTIOUS (ICD-009.3) GERD (ICD-530.81) PERSONAL HX COLONIC POLYPS (ICD-V12.72) FEVER UNSPECIFIED (ICD-780.60) DIARRHEA (ICD-787.91) SHINGLES, HX OF (ICD-V13.8) PREVENTIVE HEALTH CARE (ICD-V70.0) ENCOUNTER FOR LONG-TERM USE OF OTHER MEDICATIONS (ICD-V58.69) RIB PAIN, LEFT SIDED (ICD-786.50) INSOMNIA, PERSISTENT (ICD-307.42) NECK PAIN (ICD-723.1) OTITIS EXTERNA, RIGHT (ICD-380.10)  Social History: Reviewed history from 03/05/2010 and no changes required. 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  Alcohol Use - yes  Daily Caffeine Use Illicit  Drug Use - no  Review of Systems       As per  the HPI.  All other systems reviewed and negative.   Vital Signs:  Patient profile:   60 year old male Height:      76.5 inches Weight:      188.75 pounds BMI:     22.76 Pulse rate:   70 / minute BP sitting:   119 / 79  (left arm) Cuff size:   regular  Vitals Entered By: Caralee Ates CMA (February 10, 2011 3:02 PM)  Physical Exam  General:  Well nourished, well developed, in no acute distress HEENT: normal Neck: no JVD Cardiac:  normal S1, S2; RRR; no murmur Lungs:  clear to auscultation bilaterally, no wheezing, rhonchi or rales Abd: soft, nontender, no hepatomegaly Ext: no edema Vascular: no carotid  bruits Skin: warm and dry Neuro:  CNs 2-12 intact, no focal abnormalities noted    EKG  Procedure date:  02/10/2011  Findings:      normal sinus rhythm Heart rate 63 Normal axis Nonspecific ST-T wave changes First degree AV block with a PR interval of 212 ms  Impression & Recommendations:  Problem # 1:  CHEST PAIN UNSPECIFIED (ICD-786.50) He has had 2-3 episodes of chest discomfort that he describes as angina over the last 2-3 months.  These always occur at rest.  He denies any exertional symptoms.  He has a fairly exertional job and denies any chest discomfort with these activities.  He does have a history of acid reflux disease and did have a Nissen fundoplication in 2003.  He does not recall any symptoms recently that are reminiscent of his previous dyspepsia.  His EKG is unchanged.  I looked at his previous cath report with Dr. [name].  At this point, I have recommended that the patient proceed with a stress Myoview study.  If this is normal, we can continue with medical therapy.  I have also recommended that he start on ranitidine 150 mg b.i.d.  I've asked him to take this for about 4-6 weeks.  If his symptoms improve with this and deteriorate when he comes off of it, I think he should be further evaluated by  gastroenterology.  He will followup with Dr. [name] in the next 2-3 weeks.  Orders: Nuclear Stress Test (Nuc Stress Test)  Problem # 2:  CAD, ARTERY BYPASS GRAFT (ICD-414.04) As above.  Continue aspirin and statin.  Problem # 3:  HYPERTENSION (ICD-401.9) Controlled.  Problem # 4:  GERD (ICD-530.81) As noted above, initiate H2 receptor antagonist to see if this improves his symptoms.  Problem # 5:  ABDOMINAL AORTIC ANEURYSM (ICD-441.4) He has a history of mild dilation last measured at 2.8 x 2.8 cm in January 2011.  Patient Instructions: 1)  Your physician recommends that you schedule a follow-up appointment in:2 to 3 weeks with Dr. [name]  2)  Your physician has recommended you make the following change in your medication: Zantac  150 mg. Take one tablet by mouth twice a day for 4 to 6 weeks. then as neede thereafter. 3)  Your physician has requested that you have an exercise stress myoview.  For further information please visit [email or web address]  Please follow instruction sheet, as given. Prescriptions: ZANTAC 150 MG TABS (RANITIDINE HCL) Take one tablet by mouth twice a day for 4 to 6 week only. Take as needed there after.  #60 x 3   Entered by:   Ollen Gross, RN, BSN   Authorized by:   Tereso Newcomer PA-C   Signed by:   Ollen Gross, RN, BSN on 02/10/2011   Method used:   [facility]        [facility]  [address] [phone]       [address]       [place], [place]  [address]       Ph: [phone] or [phone]       Fax: [phone]   RxID:   [id number]  I have personally reviewed the prescriptions today for accuracy.. . Tereso Newcomer PA-C  February 10, 2011 4:48 PM

## 2011-02-19 NOTE — Progress Notes (Signed)
Summary: angina on sat night  Phone Note Call from Patient Call back at Home Phone [phone]   Caller: Patient Reason for Call: Talk to Nurse Summary of Call: pt had angina on sat night. took nitro, 3 asa.  @ 6:30 p.m. wait 15 min. took 2nd nitro.@ 6:45 p.m. wait 15 min took 3 rd nitro / 2 more asa. -81mg . @ 7p.m.Marland Kitchen no er visit. pain went away.  Initial call taken by: Lorne Skeens,  February 09, 2011 1:44 PM  Follow-up for Phone Call        I spoke with the pt and he had "strong angina"  Saturday night.  The pt did take 3 NTG and his pain subsided 15-20 minutes after 3rd NTG. The pt denies any further angina since Saturday.  I scheduled the pt to see the PA on 02/10/11 at 3 for evaluation.  The pt has been seeing Dr Laury Axon about anxiety issues.  The pt will call the office or go to the ER if he has any further issues.  Follow-up by: Julieta Gutting, RN, BSN,  February 09, 2011 4:09 PM

## 2011-02-24 NOTE — Progress Notes (Signed)
Summary: Request to change Clonazepam  Phone Note Call from Patient   Caller: Patient Call For: Loreen Freud DO Summary of Call: Note from patient requesting to change Clonazepam to 0.5mg  3 by mouth at bedtime with a q #270 with 3 refills sent to [facility].... Per Dr.Lowne-- OK with no refills..... Letter sent to be scanned Initial call taken by: Almeta Monas CMA Duncan Dull),  February 18, 2011 4:06 PM    New/Updated Medications: CLONAZEPAM 0.5 MG TABS (CLONAZEPAM) 3 by mouth at bedtime Prescriptions: CLONAZEPAM 0.5 MG TABS (CLONAZEPAM) 3 by mouth at bedtime  #270 x 0   Entered by:   Almeta Monas CMA (AAMA)   Authorized by:   Loreen Freud DO   Signed by:   Almeta Monas CMA (AAMA) on 02/18/2011   Method used:   Printed then faxed to ...       [facility]       [address] [place], [place]  [address]       Ph: [phone]       Fax: [phone]   RxID:   [id number]

## 2011-02-24 NOTE — Assessment & Plan Note (Addendum)
Summary: r/s myoview wt 247//dx 786.05/bcbs/sl  Nuclear Med Background Indications for Stress Test: Evaluation for Ischemia, Graft Patency   History: CABG, Echo, GXT, Heart Catheterization, Myocardial Infarction, Myocardial Perfusion Study  History Comments: '02 MI /Cabg; '07 GXT -Nml.; '09 Echo EF=60-65% ; MPS -EF=60% slight isch. inf. wall  Symptoms: Chest Pain, Diaphoresis, Nausea, Palpitations, SOB  Symptoms Comments: Hx. syncope   Nuclear Pre-Procedure Cardiac Risk Factors: History of Smoking, Hypertension, Lipids Caffeine/Decaff Intake: None NPO After: 8:30 PM Lungs: clear IV 0.9% NS with Angio Cath: 18g     IV Site: R Antecubital IV Started by: Stanton Kidney, EMT-P Chest Size (in) 44     Height (in): 76.5 Weight (lb): 187 BMI: 22.55 Tech Comments: metoprolol held > 24 hours, per patient.  Nuclear Med Study 1 or 2 day study:  1 day     Stress Test Type:  Stress Reading MD:  Dietrich Pates, MD     Referring MD:  T.Stuckey Resting Radionuclide:  Technetium 18m Tetrofosmin     Resting Radionuclide Dose:  11 mCi  Stress Radionuclide:  Technetium 44m Tetrofosmin     Stress Radionuclide Dose:  33 mCi   Stress Protocol Exercise Time (min):  10:16 min     Max HR:  141 bpm     Predicted Max HR:  161 bpm  Max Systolic BP: 143 mm Hg     Percent Max HR:  87.58 %     METS: 12.10 Rate Pressure Product:  29562    Stress Test Technologist:  Milana Na, EMT-P     Nuclear Technologist:  Domenic Polite, CNMT  Rest Procedure  Myocardial perfusion imaging was performed at rest 45 minutes following the intravenous administration of Technetium 64m Tetrofosmin.  Stress Procedure  The patient exercised for 10:16.  The patient stopped due to fatigue and denied any chest pain.  There were non specific ST-T wave changes and pvcs.  Technetium 87m Tetrofosmin was injected at peak exercise and myocardial perfusion imaging was performed after a brief delay.  QPS Raw Data Images:  Images were  motion corrected.  Soft tissue (diaphragm, bowel activity) underlie heart. Stress Images:  Defect in the inferior wall (base, mid, minimally distal).  Otherwse normal perfusion. Rest Images:  Minimal improvemtn in the inferior wall (mid) Subtraction (SDS):  Does not appear to be signif for ischemia. Transient Ischemic Dilatation:  0.77  (Normal <1.22)  Lung/Heart Ratio:  0.30  (Normal <0.45)  Quantitative Gated Spect Images QGS EDV:  121 ml QGS ESV:  40 ml QGS EF:  67 % QGS cine images:  Normal wall thickening.   Overall Impression  Exercise Capacity: Excellent exercise capacity. BP Response: Normal blood pressure response. Clinical Symptoms: No chest pain ECG Impression: No significant ST changes to suggest ischemia.  Note transient bigeminy during exercise (7 min) and 1 triplet. Overall Impression Comments: Inferior defect consistent with soft tissue attenuation and very mild ischemia.  Does not appear to be significant quantitatively.  LVEF 67%  Appended Document: r/s myoview wt 247//dx 786.05/bcbs/sl study reviewed with patient in detail in office today.  TS

## 2011-02-25 ENCOUNTER — Encounter: Payer: Self-pay | Admitting: Cardiology

## 2011-02-25 ENCOUNTER — Ambulatory Visit (INDEPENDENT_AMBULATORY_CARE_PROVIDER_SITE_OTHER): Payer: Federal, State, Local not specified - PPO | Admitting: Cardiology

## 2011-02-25 DIAGNOSIS — I714 Abdominal aortic aneurysm, without rupture, unspecified: Secondary | ICD-10-CM

## 2011-02-25 DIAGNOSIS — I2510 Atherosclerotic heart disease of native coronary artery without angina pectoris: Secondary | ICD-10-CM

## 2011-02-25 DIAGNOSIS — E785 Hyperlipidemia, unspecified: Secondary | ICD-10-CM

## 2011-03-01 LAB — DIFFERENTIAL
Basophils Absolute: 0 10*3/uL (ref 0.0–0.1)
Basophils Relative: 1 % (ref 0–1)
Eosinophils Absolute: 0.1 10*3/uL (ref 0.0–0.7)
Eosinophils Relative: 1 % (ref 0–5)
Lymphocytes Relative: 22 % (ref 12–46)
Lymphs Abs: 0.9 10*3/uL (ref 0.7–4.0)
Monocytes Absolute: 0.3 10*3/uL (ref 0.1–1.0)
Monocytes Relative: 8 % (ref 3–12)
Neutro Abs: 2.9 10*3/uL (ref 1.7–7.7)
Neutrophils Relative %: 68 % (ref 43–77)

## 2011-03-01 LAB — COMPREHENSIVE METABOLIC PANEL
ALT: 39 U/L (ref 0–53)
AST: 33 U/L (ref 0–37)
Albumin: 3.7 g/dL (ref 3.5–5.2)
Alkaline Phosphatase: 44 U/L (ref 39–117)
BUN: 13 mg/dL (ref 6–23)
CO2: 27 mEq/L (ref 19–32)
Calcium: 9.1 mg/dL (ref 8.4–10.5)
Chloride: 106 mEq/L (ref 96–112)
Creatinine, Ser: 1.13 mg/dL (ref 0.4–1.5)
GFR calc Af Amer: >60 mL/min (ref >60)
GFR calc non Af Amer: >60 mL/min (ref >60)
Glucose, Bld: 140 mg/dL — ABNORMAL HIGH (ref 70–99)
Potassium: 3.8 mEq/L (ref 3.5–5.1)
Sodium: 138 mEq/L (ref 135–145)
Total Bilirubin: 0.6 mg/dL (ref 0.3–1.2)
Total Protein: 6.1 g/dL (ref 6.0–8.3)

## 2011-03-01 LAB — CBC
HCT: 36.4 % — ABNORMAL LOW (ref 39.0–52.0)
HCT: 38.1 % — ABNORMAL LOW (ref 39.0–52.0)
Hemoglobin: 12.5 g/dL — ABNORMAL LOW (ref 13.0–17.0)
Hemoglobin: 13.5 g/dL (ref 13.0–17.0)
MCH: 33.3 pg (ref 26.0–34.0)
MCH: 34.4 pg — ABNORMAL HIGH (ref 26.0–34.0)
MCHC: 34.2 g/dL (ref 30.0–36.0)
MCHC: 35.4 g/dL (ref 30.0–36.0)
MCV: 97.2 fL (ref 78.0–100.0)
MCV: 97.5 fL (ref 78.0–100.0)
Platelets: 133 10*3/uL — ABNORMAL LOW (ref 150–400)
Platelets: 147 10*3/uL — ABNORMAL LOW (ref 150–400)
RBC: 3.73 MIL/uL — ABNORMAL LOW (ref 4.22–5.81)
RBC: 3.92 MIL/uL — ABNORMAL LOW (ref 4.22–5.81)
RDW: 12.9 % (ref 11.5–15.5)
RDW: 13 % (ref 11.5–15.5)
WBC: 3.6 10*3/uL — ABNORMAL LOW (ref 4.0–10.5)
WBC: 4.2 10*3/uL (ref 4.0–10.5)

## 2011-03-01 LAB — TROPONIN I: Troponin I: 0.01 ng/mL (ref 0.00–0.06)

## 2011-03-01 LAB — CK TOTAL AND CKMB (NOT AT ARMC)
CK, MB: 1.2 ng/mL (ref 0.3–4.0)
Relative Index: INVALID (ref 0.0–2.5)
Total CK: 52 U/L (ref 7–232)

## 2011-03-01 LAB — HEPARIN LEVEL (UNFRACTIONATED)
Heparin Unfractionated: 0.84 IU/mL — ABNORMAL HIGH (ref 0.30–0.70)
Heparin Unfractionated: <0.1 IU/mL — ABNORMAL LOW (ref 0.30–0.70)

## 2011-03-01 LAB — CARDIAC PANEL(CRET KIN+CKTOT+MB+TROPI)
CK, MB: 2.4 ng/mL (ref 0.3–4.0)
CK, MB: 3.4 ng/mL (ref 0.3–4.0)
Relative Index: INVALID (ref 0.0–2.5)
Relative Index: INVALID (ref 0.0–2.5)
Total CK: 55 U/L (ref 7–232)
Total CK: 61 U/L (ref 7–232)
Troponin I: 0.12 ng/mL — ABNORMAL HIGH (ref 0.00–0.06)
Troponin I: 0.16 ng/mL — ABNORMAL HIGH (ref 0.00–0.06)

## 2011-03-01 LAB — POCT CARDIAC MARKERS
CKMB, poc: <1 ng/mL — ABNORMAL LOW (ref 1.0–8.0)
Myoglobin, poc: 46.7 ng/mL (ref 12–200)
Troponin i, poc: <0.05 ng/mL (ref 0.00–0.09)

## 2011-03-01 LAB — PROTIME-INR
INR: 1.06 (ref 0.00–1.49)
Prothrombin Time: 13.7 seconds (ref 11.6–15.2)

## 2011-03-02 ENCOUNTER — Encounter: Payer: Self-pay | Admitting: Family Medicine

## 2011-03-02 ENCOUNTER — Encounter (INDEPENDENT_AMBULATORY_CARE_PROVIDER_SITE_OTHER): Payer: Federal, State, Local not specified - PPO | Admitting: Family Medicine

## 2011-03-02 ENCOUNTER — Other Ambulatory Visit: Payer: Self-pay | Admitting: Family Medicine

## 2011-03-02 DIAGNOSIS — I25810 Atherosclerosis of coronary artery bypass graft(s) without angina pectoris: Secondary | ICD-10-CM

## 2011-03-02 DIAGNOSIS — I714 Abdominal aortic aneurysm, without rupture, unspecified: Secondary | ICD-10-CM

## 2011-03-02 DIAGNOSIS — E785 Hyperlipidemia, unspecified: Secondary | ICD-10-CM

## 2011-03-02 DIAGNOSIS — Z0000 Encounter for general adult medical examination without abnormal findings: Secondary | ICD-10-CM

## 2011-03-02 DIAGNOSIS — I10 Essential (primary) hypertension: Secondary | ICD-10-CM

## 2011-03-02 DIAGNOSIS — K219 Gastro-esophageal reflux disease without esophagitis: Secondary | ICD-10-CM

## 2011-03-02 DIAGNOSIS — Z872 Personal history of diseases of the skin and subcutaneous tissue: Secondary | ICD-10-CM | POA: Insufficient documentation

## 2011-03-02 DIAGNOSIS — R079 Chest pain, unspecified: Secondary | ICD-10-CM

## 2011-03-02 DIAGNOSIS — K921 Melena: Secondary | ICD-10-CM | POA: Insufficient documentation

## 2011-03-02 LAB — CBC WITH DIFFERENTIAL/PLATELET
Basophils Absolute: 0 10*3/uL (ref 0.0–0.1)
Basophils Relative: 0.6 % (ref 0.0–3.0)
Eosinophils Absolute: 0.1 10*3/uL (ref 0.0–0.7)
Eosinophils Relative: 1.7 % (ref 0.0–5.0)
HCT: 40.1 % (ref 39.0–52.0)
Hemoglobin: 14.1 g/dL (ref 13.0–17.0)
Lymphocytes Relative: 39.9 % (ref 12.0–46.0)
Lymphs Abs: 1.4 10*3/uL (ref 0.7–4.0)
MCHC: 35.2 g/dL (ref 30.0–36.0)
MCV: 94.7 fl (ref 78.0–100.0)
Monocytes Absolute: 0.3 10*3/uL (ref 0.1–1.0)
Monocytes Relative: 8 % (ref 3.0–12.0)
Neutro Abs: 1.8 10*3/uL (ref 1.4–7.7)
Neutrophils Relative %: 49.8 % (ref 43.0–77.0)
Platelets: 161 10*3/uL (ref 150.0–400.0)
RBC: 4.23 Mil/uL (ref 4.22–5.81)
RDW: 13.1 % (ref 11.5–14.6)
WBC: 3.6 10*3/uL — ABNORMAL LOW (ref 4.5–10.5)

## 2011-03-02 LAB — BASIC METABOLIC PANEL
BUN: 14 mg/dL (ref 6–23)
CO2: 28 mEq/L (ref 19–32)
Calcium: 8.9 mg/dL (ref 8.4–10.5)
Chloride: 101 mEq/L (ref 96–112)
Creatinine, Ser: 1 mg/dL (ref 0.4–1.5)
GFR: 78.54 mL/min (ref >60.00)
Glucose, Bld: 81 mg/dL (ref 70–99)
Potassium: 3.9 mEq/L (ref 3.5–5.1)
Sodium: 136 mEq/L (ref 135–145)

## 2011-03-02 LAB — LDL CHOLESTEROL, DIRECT: Direct LDL: 84.4 mg/dL

## 2011-03-02 LAB — HEPATIC FUNCTION PANEL
ALT: 25 U/L (ref 0–53)
AST: 22 U/L (ref 0–37)
Albumin: 4.6 g/dL (ref 3.5–5.2)
Alkaline Phosphatase: 52 U/L (ref 39–117)
Bilirubin, Direct: 0.1 mg/dL (ref 0.0–0.3)
Total Bilirubin: 0.6 mg/dL (ref 0.3–1.2)
Total Protein: 6.9 g/dL (ref 6.0–8.3)

## 2011-03-02 LAB — CONVERTED CEMR LAB
Bilirubin Urine: NEGATIVE
Blood in Urine, dipstick: NEGATIVE
Glucose, Urine, Semiquant: NEGATIVE
Ketones, urine, test strip: NEGATIVE
Nitrite: NEGATIVE
Protein, U semiquant: NEGATIVE
Specific Gravity, Urine: <1.005
Urobilinogen, UA: NEGATIVE
WBC Urine, dipstick: NEGATIVE
pH: 5

## 2011-03-02 LAB — LIPID PANEL
Cholesterol: 162 mg/dL (ref 0–200)
HDL: 34.7 mg/dL — ABNORMAL LOW (ref >39.00)
Total CHOL/HDL Ratio: 5
Triglycerides: 225 mg/dL — ABNORMAL HIGH (ref 0.0–149.0)
VLDL: 45 mg/dL — ABNORMAL HIGH (ref 0.0–40.0)

## 2011-03-02 LAB — TSH: TSH: 3.19 u[IU]/mL (ref 0.35–5.50)

## 2011-03-02 LAB — PSA: PSA: 0.63 ng/mL (ref 0.10–4.00)

## 2011-03-03 NOTE — Letter (Signed)
Summary: Patient Request to Change Rx dose for Clonazepam   Patient Request to Change Rx dose for Clonazepam   Imported By: Maryln Gottron 02/25/2011 14:35:54  _____________________________________________________________________  External Attachment:    Type:   Image     Comment:   External Document

## 2011-03-12 NOTE — Assessment & Plan Note (Signed)
Summary: physical and fasting labs///sph---[phone]   Vital Signs:  Patient profile:   60 year old male Height:      76.5 inches Weight:      187.0 pounds Pulse rate:   72 / minute Pulse rhythm:   irregular BP sitting:   130 / 80  (left arm) Cuff size:   regular  Vitals Entered By: Almeta Monas CMA Duncan Dull) (March 02, 2011 10:24 AM) CC: CPX---No EKG   History of Present Illness: Pt here for cpe and labs.  Pt saw cardiology on 14 th to review stress test.   Pt has had no chest pain since January.     Preventive Screening-Counseling & Management  Alcohol-Tobacco     Alcohol drinks/day: 2     Alcohol type: wine     >5/day in last 3 mos: no     Smoking Status: quit     Year Quit: 1997  Caffeine-Diet-Exercise     Caffeine use/day: 2-3     Caffeine Counseling: decrease use of caffeine     Diet Comments: well balanced     Does Patient Exercise: yes     Type of exercise: walking     Exercise (avg: min/session): 10:16     Times/week: 5  Hep-HIV-STD-Contraception     Dental Visit-last 6 months yes     Dental Care Counseling: not indicated; dental care within six months     TSE monthly: yes     Testicular SE Education/Counseling not indicated; STE done regularly     Sun Exposure-Excessive: no     Sun Exposure Counseling: not indicated; sun exposure is acceptable  Safety-Violence-Falls     Seat Belt Use: yes     Seat Belt Counseling: not indicated; patient wears seat belts      Drug Use:  no.    Problems Prior to Update: 1)  Anal Fissure, Hx of  (ICD-V13.3) 2)  Chest Pain Unspecified  (ICD-786.50) 3)  Anxiety State, Unspecified  (ICD-300.00) 4)  Abnormal Involuntary Movements  (ICD-781.0) 5)  Cad, Artery Bypass Graft  (ICD-414.04) 6)  Hyperlipidemia  (ICD-272.4) 7)  Syncope  (ICD-780.2) 8)  Hypertension  (ICD-401.9) 9)  Abdominal Aortic Aneurysm  (ICD-441.4) 10)  Diarrhea-presumed Infectious  (ICD-009.3) 11)  Gerd  (ICD-530.81) 12)  Personal Hx Colonic Polyps   (ICD-V12.72) 13)  Fever Unspecified  (ICD-780.60) 14)  Diarrhea  (ICD-787.91) 15)  Shingles, Hx of  (ICD-V13.8) 16)  Preventive Health Care  (ICD-V70.0) 17)  Encounter For Long-term Use of Other Medications  (ICD-V58.69) 18)  Rib Pain, Left Sided  (ICD-786.50) 19)  Insomnia, 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 Take 1 Tablet By Mouth Twice A Day As Needed 2)  Tramadol Hcl 50 Mg Tabs (Tramadol Hcl) .... Take As Needed Pain 3)  Altace 5 Mg  Caps (Ramipril) .Marland Kitchen.. 1 By Mouth Two Times A Day 4)  Clonazepam 0.5 Mg Tabs (Clonazepam) .... 3 By Mouth At Bedtime 5)  Adult Aspirin Low Strength 81 Mg  Tbdp (Aspirin) .Marland Kitchen.. 1 By Mouth Once Daily 6)  Toprol Xl 25 Mg Xr24h-Tab (Metoprolol Succinate) .Marland Kitchen.. 1 Tab Two Times A Day 7)  Zanaflex 4 Mg Caps (Tizanidine Hcl) .... As Needed 8)  Pravachol 80 Mg  Tabs (Pravastatin Sodium) .Marland Kitchen.. 1 By Mouth At Bedtime 9)  Avodart 0.5 Mg  Caps (Dutasteride) .Marland Kitchen.. 1 By Mouth Once Daily  10)  Vicodin Es 7.5-750 Mg Tabs (Hydrocodone-Acetaminophen) .Marland Kitchen.. 1 By Mouth Every 6 Hours As Needed 11)  Remeron 30 Mg Tabs (Mirtazapine) .Marland Kitchen.. 1 By Mouth At Bedtime 12)  Eql Coq10 300 Mg Caps (Coenzyme Q10) .... Once A Day 13)  Tumeric 500mg  Cap .... Once A Day 14)  Omega-3 Krill Oil 300 Mg Caps (Krill Oil) .... 0nce A Day 15)  Nitrostat 0.4 Mg Subl (Nitroglycerin) .Marland Kitchen.. 1 Tablet Under Tongue At Onset of Chest Pain; You May Repeat Every 5 Minutes For Up To 3 Doses. 16)  Ativan 0.5 Mg Tabs (Lorazepam) .Marland Kitchen.. 1 By Mouth Three Times A Day As Needed 17)  Pepcid Ac 10 Mg Tabs (Famotidine) .... Take As Directed As Needed  Current Medications (verified): 1)  Valtrex 500 Mg Tabs (Valacyclovir Hcl) .... Take 1 Tablet By Mouth Twice A Day As Needed 2)  Tramadol Hcl 50 Mg Tabs (Tramadol Hcl) .... Take As Needed Pain 3)  Altace 5 Mg  Caps (Ramipril) .Marland Kitchen.. 1 By Mouth Two  Times A Day 4)  Clonazepam 0.5 Mg Tabs (Clonazepam) .... 3 By Mouth At Bedtime 5)  Adult Aspirin Low Strength 81 Mg  Tbdp (Aspirin) .Marland Kitchen.. 1 By Mouth Once Daily 6)  Toprol Xl 25 Mg Xr24h-Tab (Metoprolol Succinate) .Marland Kitchen.. 1 Tab Two Times A Day 7)  Zanaflex 4 Mg Caps (Tizanidine Hcl) .Marland Kitchen.. 1 By Mouth Q6h 8)  Pravachol 80 Mg  Tabs (Pravastatin Sodium) .Marland Kitchen.. 1 By Mouth At Bedtime 9)  Avodart 0.5 Mg  Caps (Dutasteride) .Marland Kitchen.. 1 By Mouth Once Daily 10)  Vicodin Es 7.5-750 Mg Tabs (Hydrocodone-Acetaminophen) .Marland Kitchen.. 1 By Mouth Every 6 Hours As Needed 11)  Remeron 30 Mg Tabs (Mirtazapine) .Marland Kitchen.. 1 By Mouth At Bedtime 12)  Eql Coq10 300 Mg Caps (Coenzyme Q10) .... Once A Day 13)  Tumeric 500mg  Cap .... Once A Day 14)  Omega-3 Krill Oil 300 Mg Caps (Krill Oil) .... 0nce A Day 15)  Nitrostat 0.4 Mg Subl (Nitroglycerin) .Marland Kitchen.. 1 Tablet Under Tongue At Onset of Chest Pain; You May Repeat Every 5 Minutes For Up To 3 Doses. 16)  Ativan 0.5 Mg Tabs (Lorazepam) .Marland Kitchen.. 1 By Mouth Three Times A Day As Needed 17)  Pepcid Ac 10 Mg Tabs (Famotidine) .... Take As Directed As Needed  Allergies (verified): 1)  ! Pcn 2)  ! 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) DIARRHEA-PRESUMED INFECTIOUS (ICD-009.3) GERD (ICD-530.81) PERSONAL HX COLONIC POLYPS (ICD-V12.72) FEVER UNSPECIFIED (ICD-780.60) DIARRHEA (ICD-787.91) SHINGLES, HX OF (ICD-V13.8) PREVENTIVE HEALTH CARE (ICD-V70.0) ENCOUNTER FOR LONG-TERM USE OF OTHER MEDICATIONS (ICD-V58.69) RIB PAIN, LEFT SIDED (ICD-786.50) INSOMNIA, PERSISTENT (ICD-307.42) NECK PAIN (ICD-723.1) OTITIS EXTERNA, RIGHT (ICD-380.10)  Family History: Last updated: 03/05/2010 Alcohilism-Mother No FH of Colon Cancer:  Social History: Last updated: 03/05/2010 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  Alcohol Use - yes  Daily Caffeine Use Illicit Drug Use - no  Risk Factors: Alcohol Use: 2 (03/02/2011) >5 drinks/d w/in last 3 months: no (03/02/2011) Caffeine Use: 2-3 (03/02/2011) Diet: well balanced (03/02/2011) Exercise: yes (03/02/2011)  Risk Factors: Smoking Status: quit (03/02/2011)  Past Surgical History: Cervical laminectomy Hiatal Hernia surgery Heart bypass x 6 cardiac cath 06/05/2010 stress test 01/2011  Family History: Reviewed history from 03/05/2010 and no changes required. Alcohilism-Mother No FH of Colon Cancer:  Social History: Reviewed history from 03/05/2010 and  no changes required. 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  Alcohol Use - yes  Daily Caffeine Use Illicit Drug Use - no  Review of Systems      See HPI General:  Denies chills, fatigue, fever, loss of appetite, malaise, sleep disorder, sweats, weakness, and weight loss. Eyes:  Denies blurring, discharge, double vision, eye irritation, eye pain, halos, itching, light sensitivity, red eye, vision loss-1 eye, and vision loss-both eyes; optho --. ENT:  Denies decreased hearing, difficulty swallowing, ear discharge, earache, hoarseness, nasal congestion, nosebleeds, postnasal drainage, ringing in ears, sinus pressure, and sore throat. CV:  Denies bluish discoloration of lips or nails, chest pain or discomfort, difficulty breathing at night, difficulty breathing while lying down, fainting, fatigue, leg cramps with exertion, lightheadness, near fainting, palpitations, shortness of breath with exertion, swelling of feet, swelling of hands, and weight gain. Resp:  Denies chest discomfort, chest pain with inspiration, cough, coughing up blood, excessive snoring, hypersomnolence, morning headaches, pleuritic, shortness of breath, sputum productive, and wheezing. GI:  Denies abdominal pain, bloody stools, change in bowel habits,  constipation, dark tarry stools, diarrhea, excessive appetite, gas, hemorrhoids, indigestion, loss of appetite, nausea, vomiting, vomiting blood, and yellowish skin color. GU:  Denies decreased libido, discharge, dysuria, erectile dysfunction, genital sores, hematuria, incontinence, nocturia, urinary frequency, and urinary hesitancy. MS:  Complains of joint pain and low back pain; denies joint redness, joint swelling, loss of strength, mid back pain, muscle aches, muscle , cramps, muscle weakness, stiffness, and thoracic pain. Derm:  Denies changes in color of skin, changes in nail beds, dryness, excessive perspiration, flushing, hair loss, insect bite(s), itching, lesion(s), poor wound healing, and rash. Neuro:  Denies brief paralysis, difficulty with concentration, disturbances in coordination, falling down, headaches, inability to speak, memory loss, numbness, poor balance, seizures, sensation of room spinning, tingling, tremors, visual disturbances, and weakness. Psych:  Denies alternate hallucination ( auditory/visual), anxiety, depression, easily angered, easily tearful, irritability, mental problems, panic attacks, sense of great danger, suicidal thoughts/plans, thoughts of violence, unusual visions or sounds, and thoughts /plans of harming others. Endo:  Denies cold intolerance, excessive hunger, excessive thirst, excessive urination, heat intolerance, polyuria, and weight change. Heme:  Denies abnormal bruising, bleeding, enlarge lymph nodes, fevers, pallor, and skin discoloration. Allergy:  Denies hives or rash, itching eyes, persistent infections, seasonal allergies, and sneezing.  Physical Exam  General:  Well-developed,well-nourished,in no acute distress; alert,appropriate and cooperative throughout examination Head:  Normocephalic and atraumatic without obvious abnormalities. No apparent alopecia or balding. Eyes:  pupils equal, pupils round, pupils reactive to light, and no injection.     Ears:  External ear exam shows no significant lesions or deformities.  Otoscopic examination reveals clear canals, tympanic membranes are intact bilaterally without bulging, retraction, inflammation or discharge. Hearing is grossly normal bilaterally. Nose:  External nasal examination shows no deformity or inflammation. Nasal mucosa are pink and moist without lesions or exudates. Mouth:  Oral mucosa and oropharynx without lesions or exudates.  Teeth in good repair. Neck:  No deformities, masses, or tenderness noted. Chest Wall:  No deformities, masses, tenderness or gynecomastia noted. Lungs:  Normal respiratory effort, chest expands symmetrically. Lungs are clear to auscultation, no crackles or wheezes. Heart:  normal rate and no murmur.   Abdomen:  Bowel sounds positive,abdomen soft and non-tender without masses, organomegaly or hernias noted. Rectal:  No external abnormalities noted. Normal sphincter tone. No rectal masses or tenderness.stool positive for occult blood.   Genitalia:  Testes  bilaterally descended without nodularity, tenderness or masses. No scrotal masses or lesions. No penis lesions or urethral discharge. Prostate:  Prostate gland firm and smooth, no enlargement, nodularity, tenderness, mass, asymmetry or induration. Msk:  normal ROM, no joint swelling, no joint warmth, no redness over joints, no joint deformities, no joint instability, and no crepitation.   Pulses:  R posterior tibial normal, R dorsalis pedis normal, R carotid normal, L posterior tibial normal, L dorsalis pedis normal, and L carotid normal.   Extremities:  No clubbing, cyanosis, edema, or deformity noted with normal full range of motion of all joints.   Neurologic:  No cranial nerve deficits noted. Station and gait are normal. Plantar reflexes are down-going bilaterally. DTRs are symmetrical throughout. Sensory, motor and coordinative functions appear intact. Skin:  Intact without suspicious lesions or  rashes Cervical Nodes:  No lymphadenopathy noted Axillary Nodes:  No palpable lymphadenopathy Psych:  Cognition and judgment appear intact. Alert and cooperative with normal attention span and concentration. No apparent delusions, illusions, hallucinations   Impression & Recommendations:  Problem # 1:  PREVENTIVE HEALTH CARE (ICD-V70.0)  Orders: Venipuncture (16109) TLB-Lipid Panel (80061-LIPID) TLB-BMP (Basic Metabolic Panel-BMET) (80048-METABOL) TLB-CBC Platelet - w/Differential (85025-CBCD) TLB-Hepatic/Liver Function Pnl (80076-HEPATIC) TLB-TSH (Thyroid Stimulating Hormone) (84443-TSH) TLB-PSA (Prostate Specific Antigen) (84153-PSA) UA Dipstick W/ Micro (manual) (60454)  Reviewed preventive care protocols, scheduled due services, and updated immunizations.  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 1 by mouth two times a day    Adult Aspirin Low Strength 81 Mg Tbdp (Aspirin) .Marland Kitchen... 1 by mouth once daily    Toprol Xl 25 Mg Xr24h-tab (Metoprolol succinate) .Marland Kitchen... 1 tab two times a day    Nitrostat 0.4 Mg Subl (Nitroglycerin) .Marland Kitchen... 1 tablet under tongue at onset of chest pain; you may repeat every 5 minutes for up to 3 doses.  Orders: Venipuncture (09811) TLB-Lipid Panel (80061-LIPID) TLB-BMP (Basic Metabolic Panel-BMET) (80048-METABOL) TLB-CBC Platelet - w/Differential (85025-CBCD) TLB-Hepatic/Liver Function Pnl (80076-HEPATIC) TLB-TSH (Thyroid Stimulating Hormone) (84443-TSH) TLB-PSA (Prostate Specific Antigen) (84153-PSA)  Labs Reviewed: Chol: 155 (05/19/2010)   HDL: 35.10 (05/19/2010)   LDL: 123 (02/11/2009)   TG: 291.0 (05/19/2010)  Problem # 3:  HYPERLIPIDEMIA (ICD-272.4)  His updated medication list for this problem includes:    Pravachol 80 Mg Tabs (Pravastatin sodium) .Marland Kitchen... 1 by mouth at bedtime  Orders: Venipuncture (91478) TLB-Lipid Panel  (80061-LIPID) TLB-BMP (Basic Metabolic Panel-BMET) (80048-METABOL) TLB-CBC Platelet - w/Differential (85025-CBCD) TLB-Hepatic/Liver Function Pnl (80076-HEPATIC) TLB-TSH (Thyroid Stimulating Hormone) (84443-TSH) TLB-PSA (Prostate Specific Antigen) (84153-PSA)  Labs Reviewed: SGOT: 24 (01/23/2010)   SGPT: 22 (01/23/2010)   HDL:35.10 (05/19/2010), 42.60 (03/10/2010)  LDL:123 (02/11/2009), 96 (29/56/2130)  Chol:155 (05/19/2010), 206 (03/10/2010)  Trig:291.0 (05/19/2010), 291.0 (03/10/2010)  Problem # 4:  HYPERTENSION (ICD-401.9)  His updated medication list for this problem includes:    Altace 5 Mg Caps (Ramipril) .Marland Kitchen... 1 by mouth two times a day    Toprol Xl 25 Mg Xr24h-tab (Metoprolol succinate) .Marland Kitchen... 1 tab two times a day  Orders: Venipuncture (86578) TLB-Lipid Panel (80061-LIPID) TLB-BMP (Basic Metabolic Panel-BMET) (80048-METABOL) TLB-CBC Platelet - w/Differential (85025-CBCD) TLB-Hepatic/Liver Function Pnl (80076-HEPATIC) TLB-TSH (Thyroid Stimulating Hormone) (84443-TSH) TLB-PSA (Prostate Specific Antigen) (84153-PSA)  BP today: 130/80 Prior BP: 119/79 (02/10/2011)  Labs Reviewed: K+: 4.3 (05/19/2010) Creat: : 0.9 (05/19/2010)   Chol: 155 (05/19/2010)   HDL: 35.10 (05/19/2010)   LDL: 123 (02/11/2009)   TG: 291.0 (05/19/2010)  Problem # 5:  GERD (ICD-530.81)  His updated medication list for this problem includes:    Pepcid Ac 10 Mg Tabs (Famotidine) .Marland Kitchen... Take as directed as needed  Orders: Venipuncture (16109) TLB-Lipid Panel (80061-LIPID) TLB-BMP (Basic Metabolic Panel-BMET) (80048-METABOL) TLB-CBC Platelet - w/Differential (85025-CBCD) TLB-Hepatic/Liver Function Pnl (80076-HEPATIC) TLB-TSH (Thyroid Stimulating Hormone) (84443-TSH) TLB-PSA (Prostate Specific Antigen) (84153-PSA)  Diagnostics Reviewed:  Discussed lifestyle modifications, diet, antacids/medications, and preventive measures. Handout provided.   Problem # 6:  ANAL FISSURE, HX OF (ICD-V13.3) pt uses  witch hazel to help them heal f/u GI  Complete Medication List: 1)  Valtrex 500 Mg Tabs (Valacyclovir hcl) .... Take 1 tablet by mouth twice a day as needed 2)  Tramadol Hcl 50 Mg Tabs (Tramadol hcl) .... Take as needed pain 3)  Altace 5 Mg Caps (Ramipril) .Marland Kitchen.. 1 by mouth two times a day 4)  Clonazepam 0.5 Mg Tabs (Clonazepam) .... 3 by mouth at bedtime 5)  Adult Aspirin Low Strength 81 Mg Tbdp (Aspirin) .Marland Kitchen.. 1 by mouth once daily 6)  Toprol Xl 25 Mg Xr24h-tab (Metoprolol succinate) .Marland Kitchen.. 1 tab two times a day 7)  Zanaflex 4 Mg Caps (Tizanidine hcl) .Marland Kitchen.. 1 by mouth q6h 8)  Pravachol 80 Mg Tabs (Pravastatin sodium) .Marland Kitchen.. 1 by mouth at bedtime 9)  Avodart 0.5 Mg Caps (Dutasteride) .Marland Kitchen.. 1 by mouth once daily 10)  Vicodin Es 7.5-750 Mg Tabs (Hydrocodone-acetaminophen) .Marland Kitchen.. 1 by mouth every 6 hours as needed 11)  Remeron 30 Mg Tabs (Mirtazapine) .Marland Kitchen.. 1 by mouth at bedtime 12)  Eql Coq10 300 Mg Caps (Coenzyme q10) .... Once a day 13)  Tumeric 500mg  Cap  .... Once a day 14)  Omega-3 Krill Oil 300 Mg Caps (Krill oil) .... 0nce a day 15)  Nitrostat 0.4 Mg Subl (Nitroglycerin) .Marland Kitchen.. 1 tablet under tongue at onset of chest pain; you may repeat every 5 minutes for up to 3 doses. 16)  Ativan 0.5 Mg Tabs (Lorazepam) .Marland Kitchen.. 1 by mouth three times a day as needed 17)  Pepcid Ac 10 Mg Tabs (Famotidine) .... Take as directed as needed  Patient Instructions: 1)  Please schedule a follow-up appointment in 6 months .  Prescriptions: ZANAFLEX 4 MG CAPS (TIZANIDINE HCL) 1 by mouth q6h  #60 x 1   Entered and Authorized by:   Loreen Freud DO   Signed by:   Loreen Freud DO on 03/02/2011   Method used:   Electronically to        [facility]  [address] [phone]* (retail)       [address]       [place], [place]  [address]       Ph: [phone] or [phone]       Fax: [phone]   RxID:   [id number] VICODIN ES 7.5-750 MG TABS (HYDROCODONE-ACETAMINOPHEN) 1 by mouth every 6 hours as needed  #30 x  0   Entered and Authorized by:   Loreen Freud DO   Signed by:   Loreen Freud DO on 03/02/2011   Method used:   Print then Give to Patient   RxID:   [id number]    Orders Added: 1)  Venipuncture [33295] 2)  TLB-Lipid Panel [80061-LIPID] 3)  TLB-BMP (Basic Metabolic Panel-BMET) [80048-METABOL] 4)  TLB-CBC Platelet - w/Differential [85025-CBCD] 5)  TLB-Hepatic/Liver Function Pnl [80076-HEPATIC] 6)  TLB-TSH (Thyroid Stimulating Hormone) [84443-TSH] 7)  TLB-PSA (Prostate Specific Antigen) [84153-PSA] 8)  Est. Patient 40-64 years [99396] 9)  UA Dipstick W/ Micro (manual) [81000]    Flu Vaccine Next Due:  Refused   Laboratory Results   Urine Tests   Date/Time Reported: March 02, 2011 11:40 AM   Routine Urinalysis   Color: yellow Appearance: Clear Glucose: negative   (Normal Range: Negative) Bilirubin: negative   (Normal Range: Negative) Ketone: negative   (Normal Range: Negative) Spec. Gravity: <1.005   (Normal Range: 1.003-1.035) Blood: negative   (Normal Range: Negative) pH: 5.0   (Normal Range: 5.0-8.0) Protein: negative   (Normal Range: Negative) Urobilinogen: negative   (Normal Range: 0-1) Nitrite: negative   (Normal Range: Negative) Leukocyte Esterace: negative   (Normal Range: Negative)    Comments: Floydene Flock  March 02, 2011 11:40 AM       Appended Document: Orders Update let pt know Dr Marina Goodell would like to see him--- we will set up appointment   Clinical Lists Changes  Problems: Added new problem of GUAIAC POSITIVE STOOL (ICD-578.1) Orders: Added new Referral order of Gastroenterology Referral (GI) - Signed      Appended Document: physical and fasting labs///sph---[phone] pt aware and aware of appt 4/2  @ 1:30 with Dr.Perry

## 2011-03-12 NOTE — Assessment & Plan Note (Signed)
Summary: f/u myoview 02-16-11   History of Present Illness: Patient can say without eqiuivocation that he is not having chest pain, or with exertion.  He thinks he is doing better.  We spent alot of time today reviewing his data in detail.  He denies other issues at the present time.   He honestly thinks alot of this is related to work related stress, i.e., he works for the [employer], and they are going through significant downsizing at present.   Problems Prior to Update: 1)  Guaiac Positive Stool  (ICD-578.1) 2)  Anal Fissure, Hx of  (ICD-V13.3) 3)  Chest Pain Unspecified  (ICD-786.50) 4)  Anxiety State, Unspecified  (ICD-300.00) 5)  Abnormal Involuntary Movements  (ICD-781.0) 6)  Cad, Artery Bypass Graft  (ICD-414.04) 7)  Hyperlipidemia  (ICD-272.4) 8)  Syncope  (ICD-780.2) 9)  Hypertension  (ICD-401.9) 10)  Abdominal Aortic Aneurysm  (ICD-441.4) 11)  Diarrhea-presumed Infectious  (ICD-009.3) 12)  Gerd  (ICD-530.81) 13)  Personal Hx Colonic Polyps  (ICD-V12.72) 14)  Fever Unspecified  (ICD-780.60) 15)  Diarrhea  (ICD-787.91) 16)  Shingles, Hx of  (ICD-V13.8) 17)  Preventive Health Care  (ICD-V70.0) 18)  Encounter For Long-term Use of Other Medications  (ICD-V58.69) 19)  Rib Pain, Left Sided  (ICD-786.50) 20)  Insomnia, 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 Take 1 Tablet By Mouth Twice A Day As Needed 2)  Tramadol Hcl 50 Mg Tabs (Tramadol Hcl) .... Take As Needed Pain 3)  Altace 5 Mg  Caps (Ramipril) .Marland Kitchen.. 1 By Mouth Two Times A Day 4)  Clonazepam 0.5 Mg Tabs (Clonazepam) .... 3 By Mouth At Bedtime 5)  Adult Aspirin Low Strength 81 Mg  Tbdp (Aspirin) .Marland Kitchen.. 1 By Mouth Once Daily 6)  Toprol Xl 25 Mg Xr24h-Tab (Metoprolol Succinate) .Marland Kitchen.. 1 Tab Two Times A Day 7)  Zanaflex 4 Mg Caps (Tizanidine Hcl) .... As Needed 8)  Pravachol 80 Mg  Tabs  (Pravastatin Sodium) .Marland Kitchen.. 1 By Mouth At Bedtime 9)  Avodart 0.5 Mg  Caps (Dutasteride) .Marland Kitchen.. 1 By Mouth Once Daily 10)  Vicodin Es 7.5-750 Mg Tabs (Hydrocodone-Acetaminophen) .Marland Kitchen.. 1 By Mouth Every 6 Hours As Needed 11)  Remeron 30 Mg Tabs (Mirtazapine) .Marland Kitchen.. 1 By Mouth At Bedtime 12)  Eql Coq10 300 Mg Caps (Coenzyme Q10) .... Once A Day 13)  Tumeric 500mg  Cap .... Once A Day 14)  Omega-3 Krill Oil 300 Mg Caps (Krill Oil) .... 0nce A Day 15)  Nitrostat 0.4 Mg Subl (Nitroglycerin) .Marland Kitchen.. 1 Tablet Under Tongue At Onset of Chest Pain; You May Repeat Every 5 Minutes For Up To 3 Doses. 16)  Ativan 0.5 Mg Tabs (Lorazepam) .Marland Kitchen.. 1 By Mouth Three Times A Day As Needed 17)  Zantac 150 Mg Tabs (Ranitidine Hcl) .... Take One Tablet By Mouth Twice A Day For 4 To 6 Week Only. Take As Needed There After.  Allergies: 1)  ! Pcn 2)  ! 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) DIARRHEA-PRESUMED INFECTIOUS (ICD-009.3) GERD (ICD-530.81) PERSONAL HX COLONIC POLYPS (ICD-V12.72) FEVER UNSPECIFIED (ICD-780.60) DIARRHEA (ICD-787.91) SHINGLES, HX OF (ICD-V13.8) PREVENTIVE HEALTH CARE (ICD-V70.0) ENCOUNTER FOR LONG-TERM USE OF OTHER MEDICATIONS (ICD-V58.69) RIB PAIN, LEFT SIDED (ICD-786.50) INSOMNIA, PERSISTENT (ICD-307.42) NECK PAIN (ICD-723.1) OTITIS EXTERNA, RIGHT (ICD-380.10)  Past Surgical History: Last updated: 06/26/2010 Cervical laminectomy Hiatal Hernia surgery Heart bypass x 6 cardiac cath 06/05/2010  Family History: Last updated: 03/05/2010 Alcohilism-Mother No FH of Colon Cancer:  Social History: Last updated: 03/05/2010 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  Alcohol Use - yes  Daily Caffeine Use Illicit Drug Use - no  Vital Signs:  Patient profile:    60 year old male Height:      76.5 inches Weight:      188.25 pounds Pulse rate:   60 / minute BP sitting:   132 / 74  (left arm) Cuff size:   large  Vitals Entered By: Julieta Gutting, RN, BSN (February 25, 2011 3:17 PM)  Physical Exam  General:  Well developed, well nourished, in no acute distress. Head:  normocephalic and atraumatic Eyes:  PERRLA/EOM intact; conjunctiva and lids normal. Chest Wall:  MS well healed. Lungs:  Clear bilaterally to auscultation and percussion. Heart:  Non-displaced PMI, chest non-tender; regular rate and rhythm, S1, S2 without murmurs, rubs or gallops. Carotid upstroke normal, no bruit.  Pulses:  pulses normal in all 4 extremities Extremities:  No clubbing or cyanosis. Neurologic:  Alert and oriented x 3.   Nuclear ETT  Procedure date:  02/16/2011  Findings:      Overall Impression   Exercise Capacity: Excellent exercise capacity. BP Response: Normal blood pressure response. Clinical Symptoms: No chest pain ECG Impression: No significant ST changes to suggest ischemia.  Note transient bigeminy during exercise (7 min) and 1 triplet. Overall Impression Comments: Inferior defect consistent with soft tissue attenuation and very mild ischemia.  Does not appear to be significant quantitatively.  LVEF 67%    Signed by Sherrill Raring, MD, [facility] on 02/16/2011 at 6:14 PM   Cardiac Cath  Procedure date:  06/06/2010  Findings:      ASSESSMENT: 1. Severe three-vessel coronary artery disease as described above     which is unchanged. 2. 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 3. The saphenous vein sequential graft to the first obtuse marginal     artery and second obtuse marginal artery has some mild plaquing in     it.  The first limb is chronically occluded.  There is a 50% lesion     in the proximal part of the graft to moderate diffuse disease     throughout the middle of the graft. 4. Saphenous vein Y-graft to the right  coronary artery and posterior     descending artery is patent.  The posterior descending artery is     tiny and has dye hang-up in the graft which is chronic.  Saphenous     vein graft to the ramus is patent. 5. Left ventricular functions with an ejection fraction of 45-50%.  PLAN/DISCUSSION:  Overall, he has no significant change in his catheterization since 2007 except perhaps maybe a slight decrease in his LV function which was previously 60%.  There was no culprit lesion to explain his chest pain.  Recommendations will be for continued medical therapy.     Bevelyn Buckles. Bensimhon, MD    DRB/MEDQ  D:  06/06/2010  T:  06/06/2010  Job:  [id number]  Signed before import by Dolores Patty, MD, [facility]  Impression & Recommendations:  Problem # 1:  CAD, ARTERY BYPASS GRAFT (ICD-414.04) He thinks his symptoms are work related.  He seems some better since having nuclear study.  He should remain on medical  therapy.  He is in agreement with our plan.   His updated medication list for this problem includes:    Altace 5 Mg Caps (Ramipril) .Marland Kitchen... 1 by mouth two times a day    Adult Aspirin Low Strength 81 Mg Tbdp (Aspirin) .Marland Kitchen... 1 by mouth once daily    Toprol Xl 25 Mg Xr24h-tab (Metoprolol succinate) .Marland Kitchen... 1 tab two times a day    Nitrostat 0.4 Mg Subl (Nitroglycerin) .Marland Kitchen... 1 tablet under tongue at onset of chest pain; you may repeat every 5 minutes for up to 3 doses.  Orders: Specimen Handling ([id number])  Problem # 2:  HYPERLIPIDEMIA (ICD-272.4) on current treatment His updated medication list for this problem includes:    Pravachol 80 Mg Tabs (Pravastatin sodium) .Marland Kitchen... 1 by mouth at bedtime  Problem # 3:  ABDOMINAL AORTIC ANEURYSM (ICD-441.4) will need followup, and study ordered.  TS  Patient Instructions: 1)  Your physician recommends that you schedule a follow-up appointment in: 3 MONTHS 2)  Your physician has recommended you make the following change in your medication: STOP  Zantac or Tagament, START Pepcid as needed 3)  Your physician has requested that you have an abdominal aorta duplex in JANUARY 2013. During this test, an ultrasound is used to evaluate the aorta. Allow 30 minutes for this exam. Do not eat after midnight the day before and avoid carbonated beverages. There are no restrictions or special instructions.

## 2011-03-16 ENCOUNTER — Ambulatory Visit: Payer: Federal, State, Local not specified - PPO | Admitting: Internal Medicine

## 2011-03-17 ENCOUNTER — Encounter: Payer: Self-pay | Admitting: Internal Medicine

## 2011-03-19 ENCOUNTER — Telehealth: Payer: Self-pay | Admitting: *Deleted

## 2011-03-19 DIAGNOSIS — M549 Dorsalgia, unspecified: Secondary | ICD-10-CM

## 2011-03-19 NOTE — Telephone Encounter (Signed)
They will only see him with recent MRI-- has he had one--- if yes they need copy and  We can refer to [facility].   Dr Venetia Maxon.

## 2011-03-19 NOTE — Telephone Encounter (Signed)
Pt wife called to report that Pt is still having difficult with his back and he would like to be referred to back surgeon who done Dr Laury Axon surgery. Please advise

## 2011-03-19 NOTE — Telephone Encounter (Signed)
Pt wife notes that Pt had MRI about a year ago can they use that or does it have to be a recent one. Please advise

## 2011-03-22 NOTE — Telephone Encounter (Signed)
That may be good enough.  Ok to put order in for neurosurgery with Dr Alexander Mt will want copy  Of that MRI.

## 2011-03-23 NOTE — Telephone Encounter (Signed)
Referral put in     KP 

## 2011-03-24 ENCOUNTER — Telehealth: Payer: Self-pay | Admitting: Family Medicine

## 2011-03-24 NOTE — Telephone Encounter (Signed)
Patient has decided that he wants a referral to go to the neurosurgeon--please set it up

## 2011-03-25 NOTE — Telephone Encounter (Signed)
Left message to call office referrral put in awaiting appt info see referral and previous phone note 03-19-11.

## 2011-03-25 NOTE — Telephone Encounter (Deleted)
pregnant

## 2011-03-25 NOTE — Telephone Encounter (Signed)
Pt wife states that dr Laury Axon discuss today at [facility].

## 2011-03-26 ENCOUNTER — Other Ambulatory Visit: Payer: Self-pay | Admitting: Family Medicine

## 2011-04-06 ENCOUNTER — Ambulatory Visit (INDEPENDENT_AMBULATORY_CARE_PROVIDER_SITE_OTHER): Payer: Federal, State, Local not specified - PPO | Admitting: Internal Medicine

## 2011-04-06 ENCOUNTER — Encounter: Payer: Self-pay | Admitting: Internal Medicine

## 2011-04-06 DIAGNOSIS — Z1211 Encounter for screening for malignant neoplasm of colon: Secondary | ICD-10-CM

## 2011-04-06 DIAGNOSIS — K5900 Constipation, unspecified: Secondary | ICD-10-CM

## 2011-04-06 DIAGNOSIS — R195 Other fecal abnormalities: Secondary | ICD-10-CM

## 2011-04-06 DIAGNOSIS — Z860100 Personal history of colon polyps, unspecified: Secondary | ICD-10-CM

## 2011-04-06 MED ORDER — PEG-KCL-NACL-NASULF-NA ASC-C 100 G PO SOLR: 1.0000 | Freq: Once | ORAL | Status: AC

## 2011-04-06 NOTE — Patient Instructions (Signed)
Colon [facility] 04/13/11 2:00 pm arrive at 1:00 pm on 4th floor Moviprep prescription sent to your pharmacy for you to pick up. Colonoscopy brochure and information given for you to review.

## 2011-04-06 NOTE — Progress Notes (Signed)
HISTORY OF PRESENT ILLNESS:  [name] is a 60 y.o. male with the below listed medical history who is followed in the esophagus for GERD, colon polyps, anal fissure, and alternating bowel habits. He said today after being found to have Hemoccult-positive stool on routine physical exam. His [place] colon[place] in [place] in March 2008. He was found to have hyperplastic polyps x2 and internal hemorrhoids which were subsequently band ligated. Current complaints include chronic constipation secondary to pain medications for arthritis and back pain. He also reports intermittent problems with anal fissure, though no significant issues at present. He has seen a Careers adviser for this but was treated medically. He does have occasional abdominal discomfort associated with constipation and relieved with defecation. He is due for his routine followup colonoscopy in 11 months. GI review of systems is otherwise negative  REVIEW OF SYSTEMS:  All non-GI ROS negative except for arthritis, back pain, fatigue, insomnia.  Past Medical History  Diagnosis Date  . Adenomatous colon polyp   . CAD (coronary artery disease)   . Coronary atherosclerosis of artery bypass graft   . Other and unspecified hyperlipidemia   . Syncope and collapse   . Unspecified essential hypertension   . Abdominal aneurysm without mention of rupture   . Diarrhea of presumed infectious origin   . Esophageal reflux   . Personal history of colonic polyps   . Fever, unspecified   . Routine general medical examination at a health care facility   . Anal fissure     Past Surgical History  Procedure Date  . Cervical laminectomy   . Hiatal hernia repair   . Heart bypass     x6  . Cardiac catheterization 06-05-10    Social History [name]  reports that he has quit smoking. He does not have any smokeless tobacco history on file. He reports that he drinks alcohol. He reports that he does not use illicit drugs.  family  history includes Alcohol abuse in his mother.  There is no history of Colon cancer.  Allergies  Allergen Reactions  . Isosorbide Mononitrate     REACTION: migraine type headache  . Penicillins     REACTION: SENSITIVE TO TOUCH       PHYSICAL EXAMINATION: Vital signs: BP 126/74  Pulse 62  Ht 6\' 2"  (1.88 m)  Wt 184 lb (83.462 kg)  BMI 23.62 kg/m2  Constitutional: generally well-appearing, no acute distress Psychiatric: alert and oriented x3, cooperative Eyes: extraocular movements intact, anicteric, conjunctiva pink Mouth: oral pharynx moist, no lesions Neck: supple no lymphadenopathy Cardiovascular: heart regular rate and rhythm, no murmur Lungs: clear to auscultation bilaterally Abdomen: soft, nontender, nondistended, no obvious ascites, no peritoneal signs, normal bowel sounds, no organomegaly Rectal: Deferred until colonoscopy Extremities: no lower extremity edema bilaterally Skin: no lesions on visible extremities Neuro: No focal deficits.     ASSESSMENT:  #1. Asymptomatic Hemoccult-positive stool. #2. Remote history of adenomatous polyps. Last colonoscopy with hyperplastic polyps. #3. GERD. Asymptomatic on ranitidine #4. Functional constipation #5. History of anal fissure  PLAN:  #1. Stool softeners and fiber supplement such as Metamucil to improve bowel habits #2. Continue ranitidine and reflux precautions #3. Schedule colonoscopy.The nature of the procedure, as well as the risks, benefits, and alternatives were carefully and thoroughly reviewed with the patient. Ample time for discussion and questions allowed. The patient understood, was satisfied, and agreed to proceed. Movi prep prescribed. The patient instructed on its use

## 2011-04-10 ENCOUNTER — Encounter: Payer: Self-pay | Admitting: Internal Medicine

## 2011-04-13 ENCOUNTER — Encounter: Payer: Self-pay | Admitting: Internal Medicine

## 2011-04-13 ENCOUNTER — Ambulatory Visit (AMBULATORY_SURGERY_CENTER): Payer: Federal, State, Local not specified - PPO | Admitting: Internal Medicine

## 2011-04-13 VITALS — BP 123/76 | HR 59 | Temp 98.2°F | Resp 16 | Ht 75.0 in | Wt 185.0 lb

## 2011-04-13 DIAGNOSIS — D126 Benign neoplasm of colon, unspecified: Secondary | ICD-10-CM

## 2011-04-13 DIAGNOSIS — K5730 Diverticulosis of large intestine without perforation or abscess without bleeding: Secondary | ICD-10-CM

## 2011-04-13 DIAGNOSIS — R195 Other fecal abnormalities: Secondary | ICD-10-CM

## 2011-04-13 DIAGNOSIS — Z1211 Encounter for screening for malignant neoplasm of colon: Secondary | ICD-10-CM

## 2011-04-13 DIAGNOSIS — Z86010 Personal history of colonic polyps: Secondary | ICD-10-CM

## 2011-04-13 MED ORDER — SODIUM CHLORIDE 0.9 % IV SOLN: 500.0000 mL | INTRAVENOUS | Status: DC

## 2011-04-13 NOTE — Patient Instructions (Signed)
Follow discharge instructions.  Resume your medications.  High Fiber Diet with liberal fluid intake.  Next colonoscopy in 5 years.

## 2011-04-14 ENCOUNTER — Telehealth: Payer: Self-pay

## 2011-04-14 NOTE — Telephone Encounter (Signed)
No ID on answering machine. 

## 2011-04-22 ENCOUNTER — Ambulatory Visit (INDEPENDENT_AMBULATORY_CARE_PROVIDER_SITE_OTHER): Payer: Federal, State, Local not specified - PPO | Admitting: Family Medicine

## 2011-04-22 ENCOUNTER — Encounter: Payer: Self-pay | Admitting: Family Medicine

## 2011-04-22 DIAGNOSIS — R21 Rash and other nonspecific skin eruption: Secondary | ICD-10-CM

## 2011-04-22 MED ORDER — HYDROCORTISONE 2.5 % EX OINT: TOPICAL_OINTMENT | Freq: Two times a day (BID) | CUTANEOUS | Status: DC

## 2011-04-22 NOTE — Patient Instructions (Signed)
This appears to be a combination of irritation and fungal (diaper) rash Use the hydrocortisone cream twice daily to soothe irritation and help healing Buy over the counter Miconazole cream and apply in a thin layer twice daily to treat any possible fungal rash Call if symptoms change or worsen Hang in there!

## 2011-04-22 NOTE — Assessment & Plan Note (Signed)
Area appears to be a combination of irritation from frequent loose BMs and fungal rash.  Start both steroid ointment to heal irritation and OTC antifungal to treat rash.  Reviewed supportive care and red flags that should prompt return.  Pt expressed understanding and is in agreement w/ plan.

## 2011-04-22 NOTE — Progress Notes (Signed)
  Subjective:    Patient ID: [name], male    DOB: 1951/07/14, 60 y.o.   MRN: [id number]  HPI Rash on buttock- had colonoscopy 10 days ago and isn't sure if area in question is 'acid burn' vs infxn.  Reports that area looks like diaper rash.  Painful to touch.  Isn't sure when rash appeared.  Using diaper rash cream w/ some relief.  Rash crosses midline.   Review of Systems For ROS see HPI     Objective:   Physical Exam  Constitutional: He appears well-developed and well-nourished. No distress.  Genitourinary:     Skin: Skin is warm and dry.          Assessment & Plan:

## 2011-04-27 ENCOUNTER — Other Ambulatory Visit: Payer: Self-pay

## 2011-04-27 MED ORDER — PRAVASTATIN SODIUM 80 MG PO TABS: 80.0000 mg | ORAL_TABLET | Freq: Every day | ORAL | Status: DC

## 2011-04-27 MED ORDER — METOPROLOL SUCCINATE ER 25 MG PO TB24: 25.0000 mg | ORAL_TABLET | Freq: Two times a day (BID) | ORAL | Status: DC

## 2011-04-27 MED ORDER — RAMIPRIL 5 MG PO CAPS: 5.0000 mg | ORAL_CAPSULE | Freq: Two times a day (BID) | ORAL | Status: DC

## 2011-04-27 MED ORDER — FENOFIBRATE 160 MG PO TABS: 160.0000 mg | ORAL_TABLET | Freq: Every day | ORAL | Status: DC

## 2011-04-27 MED ORDER — DUTASTERIDE 0.5 MG PO CAPS: 0.5000 mg | ORAL_CAPSULE | Freq: Every day | ORAL | Status: DC

## 2011-04-27 MED ORDER — CELECOXIB 200 MG PO CAPS: 400.0000 mg | ORAL_CAPSULE | Freq: Every day | ORAL | Status: DC

## 2011-04-28 NOTE — Assessment & Plan Note (Signed)
[facility]                            CARDIOLOGY OFFICE NOTE   NAME:[name], Stephen RAINS                         MRN:          [id number]  DATE:08/07/2008                            DOB:          [birth year]    [name] is in for a followup.  He is really doing great.  He does have  some discomfort in his back, and in the mid back as well.  However, he  has not been taking any pain medicines before or after work or during  work for that [employer].  He has not had any syncope or presyncope or any  other things that led to his previous evaluation.  This was thought to  be probably medication related.  Importantly, the patient underwent  radionuclide imaging on 05/14/2008.  This revealed normal LV  contractility with normal LV function and ejection fraction of 62%.  Compared to the images of 2007, there was slightly more activity in the  inferior myocardial segment.  This was thought to be improved.  The  echocardiogram likewise did not suggest significant LV dysfunction.  Overall EF appeared to be normal.   He has had no further problems.   Current medications include clonazepam at night, Ambien CR 12.5  intermittently, Avodart 0.5 mg daily, Pristiq 50 mg daily, Toprol-XL 25  mg daily, enteric-coated aspirin 81 mg daily, multivitamin daily,  Pravachol 80 mg nightly, Celebrex 200 mg daily, and Altace 2.5 two  tablets b.i.d.   On physical today, he is alert and oriented.  The weight is 186, blood  pressure 132/90, and pulse 63.  Lung fields clear.  Cardiac, rhythm is  regular.   Overall, the patient is stable.  He has not had any other recurrent  episodes that he had previously.  I have had him seen by  Electrophysiology and with normal LV function it will be unlikely to be  related to malignant arrhythmias.  Most likely, it was related to the  pain medicine that he was taking.  He was using hydrocodone for pain  from his oral surgery at the same time with  Flexeril and his beta-  blocker.  He was hypotensive when the paramedics arrived.   His electrocardiogram today reveals normal sinus rhythm with nonspecific  T abnormality.  Overall, the patient remains stable at the present time.  He will continue to follow up with [place] in a few months.  He knows to be  careful with his medications.  Should he have any problems in the  interim, he is to call us.     [name]. [name], MD, [facility]  Electronically Signed    TDS/MedQ  DD: 08/07/2008  DT: 08/08/2008  Job #: [id number]   cc:   Lelon Perla, DO

## 2011-04-28 NOTE — Assessment & Plan Note (Signed)
[facility]                         ELECTROPHYSIOLOGY OFFICE NOTE   NAME:Templin, LORRAINE TERRIQUEZ                         MRN:          [id number]  DATE:05/10/2008                            DOB:          11/04/1951    Mr. Gunby is referred today by Dr. Bonnee Quin for evaluation of syncope.  The patient is a very pleasant 60 year old man who has a history of  known coronary disease and is status post bypass surgery with documented  occluded grafts in the past.  He has overall relatively preserved LV  systolic function.  The patient has a history of a very small abdominal  aortic aneurysm and borderline hypertension.  The patient was in his  usual state of health until several months ago, when he had his initial  episode of syncope.  This occurred in the setting of having recently had  nitrous oxide that day for oral surgery and using hydrocodone for pain.  He also was taking Flexeril and his beta-blocker.  In that setting he  was using the bathroom and got up and passed out.  When the paramedics  arrived he was hypotensive.  He was diaphoretic.  He felt weak and  fatigued.  He eventually got better.  There were no documented  arrhythmias during this episode.  His second episode occurred in a  similar fashion several weeks ago.  In the same sense, he was going to  the bathroom.  He admits to taking Flexeril and hydrocodone and his  Altace and Ambien and subsequently passed out.  He was seen by Dr.  [name] and is referred now for additional evaluation.  He denies tachy  palpitations.  He has otherwise had no specific complaints.   CURRENT MEDICATIONS:  Include clonazepam at night, glucosamine, Ambien  CR at night, Avodart 0.5 daily, Toprol XL 25 daily, aspirin 81 mg daily,  Celebrex p.r.n., Altace 5 twice daily, Pravachol 80 a day.   FAMILY HISTORY:  Noncontributory.   SOCIAL HISTORY:  The patient is married.  He denies tobacco or ethanol  abuse at present.   REVIEW OF SYSTEMS:  As noted in the HPI.  He has no dyspnea, PND or  orthopnea.  He denies anginal symptoms whatsoever.  He has a stress test  scheduled.  Otherwise all systems were reviewed and found to be negative  except as noted above.   PHYSICAL EXAMINATION:  He is a pleasant, well-appearing man in no acute  distress.  The blood pressure today was 142/90, the pulse was 64 and  regular, the respirations were 18, the weight was 187 pounds.  HEENT:  Normocephalic and atraumatic.  Pupils equal and round.  Oropharynx was moist.  Sclerae anicteric.  NECK:  Revealed no jugular venous distention.  There was no thyromegaly.  Trachea was midline.  Carotids were 2+ and symmetric.  LUNGS:  Clear bilaterally to auscultation.  No wheezes, rales or rhonchi  are present.  There was no increased work of breathing.  CARDIAC:  Exam revealed a regular rate and rhythm with normal S1 and S2.  The PMI was not enlarged  or laterally displaced.  ABDOMEN:  Soft, nontender, nondistended.  There was no organomegaly.  Bowel sounds were present.  There was no rebound or guarding.  EXTREMITIES:  Demonstrated no cyanosis, clubbing or edema.  NEUROLOGIC:  Alert and oriented x3.  Cranial nerves intact.  Strength  was 5/5 and symmetric.  The patient had orthostatic vitals of note when  he saw Dr. [name] 2 days ago which were not remarkable for orthostasis.   His EKG demonstrates sinus rhythm with a questionable prior inferior MI.   IMPRESSION:  1. Unexplained syncope.  2. Coronary disease with preserved left ventricular function      previously.  3. Chronic low back pain.  4. Dyslipidemia.  5. Hypertension.   DISCUSSION:  The etiology of the patient's symptoms is unclear.  I think  there are probably a conglomeration of factors which have contributed to  his syncope.  These certainly include the pain and mood-altering  medications that he is on.  Also, his antihypertensives could be playing  a role as well.   There is no suggestion that he has had a tachy or  bradyarrhythmia although these have certainly not been conclusively  ruled out.  I have recommended that he wear a 3-week cardiac monitor.  He has a 2D echo pending, as well as a stress test.     Doylene Canning. Ladona Ridgel, MD  Electronically Signed    GWT/MedQ  DD: 05/10/2008  DT: 05/10/2008  Job #: [id number]

## 2011-04-28 NOTE — Assessment & Plan Note (Signed)
[facility]                            CARDIOLOGY OFFICE NOTE   NAME:[name], [name]                         MRN:          [id number]  DATE:02/04/2009                            DOB:          Apr 04, 1951    [name] is in for followup.  This is the best I have seen him do in  quite some time.  He denies any ongoing chest pain or shortness of  breath.  He feels actually quite well.   MEDICATIONS:  1. Ambien CR 12.5 three nights a week.  2. Avodart 0.5 mg daily.  3. Pristiq 50 mg daily.  4. Toprol-XL 25 mg daily.  5. Enteric-coated aspirin 81 mg daily.  6. Multivitamin daily.  7. Pravachol 80 mg at bedtime.  8. Celebrex 200 mg daily.  9. Altace 5 mg b.i.d.  10.Clonazepam 4 times a week.   PHYSICAL EXAMINATION:  Blood pressure is 124/88, pulse 58.  Lung fields  are clear.  Median sternotomy healed.  Cardiac rhythm is regular.  S4  gallop.   IMPRESSION:  1. Coronary artery disease status post coronary artery bypass graft      surgery.  2. Hypercholesterolemia on lipid-lowering therapy.  3. Severe arthritis.   PLAN:  1. We discussed the potential drug interaction between Cox inhibitors      and ACE inhibitors, although his basic metabolic profile has been      excellent.  2. He needs a lipid and liver profile.  3. He can return to clinic in 1 year.  4. At that time, an abdominal ultrasound to be done as he has a      borderline abdominal aneurysm.     [name]. [name], MD, [facility]  Electronically Signed    TDS/MedQ  DD: 02/04/2009  DT: 02/05/2009  Job #: [id number]   cc:   Lelon Perla, DO

## 2011-04-28 NOTE — Assessment & Plan Note (Signed)
[facility]                            CARDIOLOGY OFFICE NOTE   NAME:[name], [name]                         MRN:          [id number]  DATE:12/16/2007                            DOB:          02/22/1951    [name] is in for followup.  He is doing well.  He was having some  right testicular pain.  He underwent an MRI which revealed right  pericentral disk extrusion at L4-5 which was thought to be possibly a  likely source for right L5 radiculitis.  He was noted to have an  abdominal aneurysm measuring about 2.9 cm.  This has been totally  asymptomatic.  He does say his diastolics stay in the range of about 90  or higher.  He has had one episode of passing out in the middle of the  night. His blood pressure was low at that time, but he was taking some  Flexeril which apparently was thought to be the source.  He has had his  blood pressure taken on multiple occasions since then and is not low in  the evening.  He denies any ongoing chest pain at the present time.   PHYSICAL EXAMINATION:  GENERAL:  He is alert and oriented, in no  distress.  VITAL SIGNS:  Blood pressure 120/90, pulse 72.  LUNGS:  Lung fields are clear.  CARDIAC:  Rhythm is regular.  ABDOMEN:  Soft and I cannot appreciate abdominal aneurysm.   The electrocardiogram demonstrates normal sinus rhythm with nonspecific  T wave abnormality.   IMPRESSION:  1. Coronary artery disease with prior coronary bypass graft surgery.  2. Hypercholesterolemia on lipid-lowering therapy.  3. Abdominal aortic aneurysm.  4. Hypertension with borderline control.   PLAN:  1. Increase Altace to 7.5 mg b.i.d.  2. Return to clinic in six months.  3. Lipid and liver profile.  4. Abdominal ultrasound.     [name]. [name], MD, [facility]  Electronically Signed    TDS/MedQ  DD: 12/16/2007  DT: 12/16/2007  Job #: [id number]   cc:   Lelon Perla, DO

## 2011-04-28 NOTE — Assessment & Plan Note (Signed)
[facility]                            CARDIOLOGY OFFICE NOTE   NAME:Wingerter, JAPHET MORGENTHALER                         MRN:          [id number]  DATE:05/08/2008                            DOB:          [birth year]    Mr. Grob comes in today as an add-on.  He has had a couple of episodes  of blacking out.  Importantly, about a month and half ago, it was about  2:00 a.m., and he got up to go to the bathroom.  He fell and hit the  wall.  He said he was just getting up off the toilet when this happened.  He had a blood pressure checked by his wife that was 45 systolic.  EMS  came out, checked his blood pressure and told his wife that it was  fairly similar and they took him to the emergency room.  He was at [facility] where they gave him IV fluids, and he actually  somewhat improved.  He was okay until about 2 weeks ago.  He had some  oral surgery.  He had a fair amount of pain and was placed on  Hydrocodone.  He was sitting at a desk, and went out at that time as  well.  He was not sure what he was doing.  He has had a third episode  now at church, where he was standing in the aisles at church on Sunday.  According to his wife, he has been under a tremendous amount of stress.  He has also had some change in his sleeping medicines and is now taking  Pristiq 50 mg daily at night.   CURRENT MEDICATIONS:  1. Toprol XL 25 mg daily.  2. Enteric-coated aspirin 81 mg daily.  3. Multivitamin daily.  4. Pravachol 80 daily.  5. Altace 7.5 mg b.i.d.  6. Flexeril 1-2 a day.  7. Advil for lower back pain.  8. Clonazepam three nights a week 1 mg.  9. Avodart 0.5 mg.  10.Pristiq 50 mg.   He has also seen Dr. Oneita Kras recently for several things.   PHYSICAL EXAMINATION:  VITAL SIGNS:  Today, the blood pressure was  140/100 and the pulse was 63.  When rechecked by me, the blood pressure  was more in the range of 140/90.  There was absolutely no change  after  about 5-10 minutes with standing.  CARDIAC:  The cardiac rhythm was regular.  There was no significant  murmur.  LUNGS:  The lung fields were clear to auscultation and percussion.  EXTREMITIES:  Did not reveal significant edema.  He has no extremity  edema.   The electrocardiogram demonstrates normal sinus rhythm.  The PR interval  is 194 milliseconds.  The QTC is 429 milliseconds.  There is nonspecific  T-wave abnormality in the inferior and lateral leads.  When compared to  previous tracings, these biphasic T-waves have been seen previously.   IMPRESSION:  1. Coronary artery disease, status post coronary artery bypass graft      surgery with last catheterization demonstrating severe native  vessel disease, three of six grafts patent and an ejection fraction      of approximately 50%  2. Small abdominal aortic aneurysm.  3. History of hypertension with borderline control.   I have had extensive discussion today with Mr. Mould.  There are some  components which could be related to neurally mediated syncope.  The  first episode occurred when he got up off the toilet.  He does drink  coffee, probably four times a day, and his blood pressure was lower at  the time that these episodes occurred.  He did not have any  palpitations, or frequent palpitations with these.  The third occurred  in church, and the second was also associated with some pain.   I have talked to him about drinking more fluids.  This would help if it  is neurally mediated.  Secondly, I have asked him to limit his caffeine  at the present time.  We will also decrease his Altace to 5 mg twice  daily.  He has a rather difficult situation in that his wife cannot  drive very easily and cannot drive at night, and he has a job that goes  until midnight.  Despite all this, with three episodes of syncope, I  told him he should not be driving.   At the present time we will plan the following:  1. A  two-dimensional echocardiogram will be obtained to assess left      ventricular function.  2. Rest and stress Myoview imaging to reassess the patient for      significant ischemia.  3. We will obtain an event monitor for the patient to wear should he      have any events.  4. We will go ahead and get some laboratory studies to make sure that      his electrolytes and CBC are intact.  5. We will schedule him to see the electrophysiologist for further      evaluation.     [name]. [name], MD, [facility]  Electronically Signed    TDS/MedQ  DD: 05/08/2008  DT: 05/08/2008  Job #: [id number]

## 2011-04-28 NOTE — Assessment & Plan Note (Signed)
[facility]                            CARDIOLOGY OFFICE NOTE   NAME:Caporaso, TAVITA EASTHAM                         MRN:          [id number]  DATE:06/13/2007                            DOB:          1951/11/15    Mr. Nunley is in for a followup visit.  Generally, he really is doing  quite well. He is continuing to work at the [employer].  He has had  some difficulty with his medications, but more recently he has done  relatively well with his diet, exercise, and also his lipid lowering  agents.   CURRENT MEDICATIONS:  Include:  1. Toprol XL 25 mg daily.  2. Enteric coated aspirin 81 mg daily.  3. Multivitamin daily.  4. Pravachol 80 mg nightly.  5. Altace 5 mg p.o. b.i.d.  6. Flax seed oil daily.   PHYSICAL:  The blood pressure is 130/90, pulse is 56.  LUNGS:  Fields are clear.  CARDIAC:  Rhythm is regular.   The electrocardiogram demonstrates sinus bradycardia with 1st degree AV  block.   IMPRESSION:  1. Coronary artery disease with advanced coronary artery disease and      coronary anatomy as defined by Dr. Excell Seltzer in October 2007.  2. History of hypercholesterolemia.  3. History of STATIN intolerance.  4. Improved symptoms.   PLAN:  1. Return to clinic in 6 months.  2. Continue current medical regimen.  3. Notify us if there is any change in his overall status.     [name]. [name], MD, [facility]  Electronically Signed    TDS/MedQ  DD: 06/13/2007  DT: 06/14/2007  Job #: [id number]

## 2011-05-01 NOTE — Op Note (Signed)
NAMEJUDA, TOEPFER                  ACCOUNT NO.:  [id number]   MEDICAL RECORD NO.:  [id number]          PATIENT TYPE:  AMB   LOCATION:  SDS                          FACILITY:  [facility]   PHYSICIAN:  Henry A. Pool, M.D.    DATE OF BIRTH:  [birth year]   DATE OF PROCEDURE:  04/06/2006  DATE OF DISCHARGE:                                 OPERATIVE REPORT   PREOPERATIVE DIAGNOSES:  Acute left C3-4 herniated nucleus pulposus with  radiculopathy and stenosis.  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   POSTOPERATIVE DIAGNOSES:  Acute left C3-4 herniated nucleus pulposus with  radiculopathy and stenosis.  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   OPERATION PERFORMED:  C3-4 and C4-5 anterior cervical dissection and fusion  with allograft and anterior plating.   SURGEON:  Kathaleen Maser. Pool, M.D.   ASSISTANT:  Reinaldo Meeker, M.D.   ANESTHESIA:  General orotracheal.   INDICATIONS FOR PROCEDURE:  Mr. Torpey is a 60 year old male with history of a  previous C5-6 and C6-7 anterior cervical diskectomy and fusion.  The patient  had been doing well until a recent work related injury when he suffered the  acute onset of neck and left upper extremity pain failing conservative  management.  Work-up had demonstrated evidence of a large left sided C3-4  acute disk herniation with compression of spinal cord and exiting left-sided  C4 nerve root.  The patient has significant spondylosis with stenosis at C4-  5 which will also need to be addressed at this time.  Plan is for a C3-4 and  C4-5 anterior cervical diskectomy and fusion with allograft and anterior  plating.   DESCRIPTION OF PROCEDURE:  The patient was taken to the operating room and  placed on the table in supine position.  After adequate level of general  anesthesia was achieved, the patient was positioned supine with the neck  slightly extended and held in place with halter traction.  The patient's  anterior cervical region  was prepped and draped sterilely.  A 10 blade was  used to make a linear skin incision overlying the C4 vertebral level.  This  was carried down sharply to the platysma.  The platysma was then divided  vertically and dissection proceeded along the medial border of the  sternocleidomastoid muscle and carotid sheath.  Trachea and esophagus were  mobilized and retracted toward the left.  Prevertebral fascia was stripped  off the anterior spinal column.  The longus colli muscles were then elevated  bilaterally using electrocautery.  Deep self-retaining retractor was placed.  Intraoperative fluoroscopy was used and the C3-4 and C4-5 levels were  confirmed.  The disk spaces were then incised with a 15 blade in rectangular  fashion.  A wide disk space cleanout was then achieved using pituitary  rongeurs, forward and backward angled Carlens curets, Kerrison rongeurs and  a high speed drill.  All elements of the disk were removed down to the  posterior annulus.  Microscope was brought into the field and used  throughout the remainder of the diskectomy.  The remaining  aspects of the  annulus and osteophytes were removed using high speed drill down to the  posterior longitudinal ligament, starting first at C3-4.  The posterior  longitudinal ligament was then elevated and resected in piecemeal fashion  using Kerrison rongeurs.  The underlying thecal sac was identified.  A wide  central decompression was then performed by undercutting the bodies of C3  and C4 using Kerrison rongeurs. Decompression proceeded into the left-sided  C4 foramen.  A large amount of acute free disk herniation was encountered  and completely resected.  Wide anterior foraminotomy was then performed  along the course of the exiting C4 nerve root on the left side.  This was  then repeated on the patient's right side.  Similar decompression was then  performed at C4-5 again without complication.  Findings at this level was  just of  diffuse spondylosis without acute disk herniation.  The wound was  then irrigated with antibiotic solution.  Gelfoam was placed topically for  hemostasis which was found to be good.  5 mm LifeNet allograft wedge was  then impacted into place and recessed approximately 1 mm from the anterior  cortical margin.  A 45 mm Medtronic Venture plate was then placed over the  C3, C4 and C5 levels.  This was then attached under fluoroscopic guidance  using 13 mm variable angle screws, two each at all three levels.  All screws  were given a final tightening and found to be solid within the bone.  Locking screws then engaged.  Final images revealed good position of bone  graft and hardware at proper operative level with normal alignment of the  spine.  Wound was then irrigated with antibiotic solution.  Hemostasis was  achieved with bipolar electrocautery .  The wound was then closed in typical  fashion.  Steri-Strips and sterile dressing were applied.  There were no  apparent complications.  The patient tolerated the procedure well and  returned to the recovery room postoperatively.           ______________________________  Kathaleen Maser Pool, M.D.     HAP/MEDQ  D:  04/06/2006  T:  04/07/2006  Job:  [id number]

## 2011-05-01 NOTE — Assessment & Plan Note (Signed)
[facility]                              CARDIOLOGY OFFICE NOTE   NAME:EDGEDexton, Zwilling                         MRN:          [id number]  DATE:09/24/2006                            DOB:          May 18, 1951   CARDIOLOGIST:  Dr. Shawnie Pons.   HISTORY OF PRESENT ILLNESS:  Mr. Dass is a very pleasant 60 year old male  patient followed by Dr. [name] with a history of coronary disease, status  post CABG in 2002, good LV function, who had presented after his bypass with  post-pericardiotomy syndrome.  The patient was last seen in the office in  April of 2007.  He had a treadmill test at that time that was normal.  He  was cleared for surgery to have c-spine surgery with Dr. Lelon Perla.  The  patient notes an episode of a headache back in the summertime where he went  to the emergency room.  His blood pressure was on the high side.  He was  watched in the ER for several hours.  He had a head scan that was negative.  His EKG was apparently normal.  His sed rate was normal.  He was discharged  to home and asked to follow up.  The patient since that time has noted chest  pains off and on.  They were somewhat un-noticeable around the time he went  to the emergency room, but he seems to have noticed increasing frequency to  the pattern.  The pain lasts about 1 to 5 minutes.  The pain is actually  described as a pressure.  There is no radiation.  He sometimes notes  associated nausea and diaphoresis with this.  He is able to exert himself at  times without any symptoms.  He does note symptoms a lot when he bends over.  He denies any water brash symptoms.  Denies any belching.  Denies any  dysphagia or odynophagia.  Denies any dyspepsia.  He does note some bloated  sensation from time to time, just started in the last several weeks.   CURRENT MEDICATIONS:  1. Altace 2.5 mg daily.  2. Toprol XL 25 mg daily.  3. Aspirin 81 mg a day.  4. Multi-vitamin.  5.  Flexeril.  6. Flaxseed.  7. Fish oil.  8. Estra-C.  9. Lunesta p.r.n.   ALLERGIES:  PENICILLIN.   SOCIAL HISTORY:  Patient denies any current tobacco or alcohol abuse.   REVIEW OF SYSTEMS:  Please see HPI.  Denies any fever, chills, cough,  melena, hematochezia, hematuria, dysuria, numbness or tingling.  Rest of  review of systems are negative.   PHYSICAL EXAMINATION:  GENERAL:  He is a well-nourished, well-developed, in  no distress.  VITAL SIGNS:  Blood pressure 134/85, pulse 72.  HEENT:  Head normocephalic, atraumatic.  Eyes:  PERRLA, EOMI.  Sclerae  clear.  NECK:  Without JVD.  Lymph without lymphadenopathy.  ENDOCRINE:  Without thyromegaly.  CARDIAC:  Normal S1, S2, regular rate and rhythm without murmur.  LUNGS:  Clear to auscultation bilaterally without wheezing, rhonchi or  rales.  ABDOMEN:  Soft, nontender with normoactive bowel sounds, no organomegaly.  EXTREMITIES:  Without edema.  CALVES:  Soft, nontender.  SKIN:  Warm and dry.  NEUROLOGIC:  Alert and oriented x3.  Cranial nerves 2-12 are grossly intact.  Carotids without bruits bilaterally.   Electrocardiogram reveals a sinus rhythm.  The heart rate was 65, normal  axis, inferior Q-waves.  Non-specific ST-T waves changes.   IMPRESSION:  1. Atypical chest discomfort.  2. Coronary artery disease.      a.     Status post coronary artery bypass graft October 2002 - grafts       including LIMA to the LAD, vein grafts to the ramus, vein grafts to       the OM1 and OM2, vein grafts to the RCA PA.      b.     History of post-pericardiotomy syndrome.  3. Good LV function, an EF of 50%.  4. Hypertension.  5. Untreated dyslipidemia.  6. Strong family history of coronary disease.  7. Status post c-spine surgery x3.  8. History of Nissen fundoplication.   PLAN:  The patient presents to the office today with atypical chest pains.  He seemed to be increasing in frequency and intensity.  He does check his  blood  pressure when he has these episodes, and it is quite high.  He has not  checked it at other times, and he is normotensive in the office today.  I  discussed the patient's case today with Dr. [name].  At this point in time,  we plan to further evaluate him with a stress Myoview study, also check some  labs with CMET and a CBC.  He took himself of the Statin several years ago,  and will go ahead and re-check his lipids fasting at the time of his stress  Myoview study.  I will bring him back in followup with me  next week, on the day that Dr. [name] is here.  If negative, we might want  to consider referring back to gastroenterology for re-evaluation.     ______________________________  Tereso Newcomer, PA-C    ______________________________  [name]. [name], MD, [facility]   SW/MedQ DD:  09/24/2006 DT:  09/24/2006 Job #:  [id number]

## 2011-05-01 NOTE — Op Note (Signed)
[place] [facility]  Patient:    [name], [name] Visit Number: [id number] MRN: [id number]          Service Type: MED Location: 2300 2314 01 Attending Physician:  Mikey Bussing Dictated by:   Mikey Bussing, M.D. Proc. Date: 09/18/01 Admit Date:  09/17/2001   CC:         [name]. [name], M.D. [facility]   Operative Report  PREOPERATIVE DIAGNOSIS:  Class 4 unstable angina, severe three-vessel coronary artery disease, non-Q-wave myocardial infarction.  POSTOPERATIVE DIAGNOSIS:  Class 4 unstable angina, severe three-vessel coronary artery disease, non-Q-wave myocardial infarction.  OPERATION PERFORMED:  Coronary artery bypass grafting x 6 (left internal mammary artery to the left anterior descending, saphenous vein graft to ramus intermediate, sequential saphenous vein graft to OM1 and OM2, sequential saphenous vein graft to right coronary artery and posterior descending coronary artery).  SURGEON:  Mikey Bussing, M.D.  ASSISTANT:  Areta Haber, P.A.  ANESTHESIA:  General by Dr. Sheldon Silvan.  INDICATIONS FOR PROCEDURE:  The patient is a 60 year old male without prior history of cardiac disease who presented with new onset resting chest pain. He had EKG changes at the emergency room and underwent urgent cardiac catheterization.  This revealed severe three-vessel coronary artery disease and he was clinically stabilized with heparin and nitroglycerin beta blockade and Integrelin.  He was referred for surgical coronary revascularization.  I reviewed the patients cardiac catheterization with Dr. [name] in the cath lab and reviewed the results of the cardiac catheterization with the patient and his family.  I discussed the indications and expected benefits of coronary artery bypass surgery for treatment of his coronary artery disease.  I reviewed the alternatives to surgical therapy for his coronary artery disease and the expected outcome  of those alternative therapies.  I reviewed the risks associated with this operation  including the risks of MI, CVA, bleeding, infection and death.  He understood these implications of the operation and agreed to proceed with the operation as planned under informed consent.  OPERATIVE FINDINGS:  The patients saphenous vein was of average quality.  The internal mammary artery was a good conduit wtih excellent flow.  The main portion of the right coronary and the circumflex and the AV groove was sclerotic and thickened and heavily diseased.  The distal posterior descending was too small to graft.  The posterolateral branch of the distal right was too small to graft.  There was minimal myocardial damage or scarring from the MI.  DESCRIPTION OF PROCEDURE:  The patient was brought to the operating room and placed supine on the operating table where general anesthesia was induced under invasive hemodynamic monitoring.  The chest abdomen and legs were prepped with Betadine and draped as a sterile field.  A median sternotomy was performed as the saphenous vein was harvested from the left leg.  The internal mammary artery was harvested as a pedicle graft from its origin at the subclavian vessels.  Heparin was administered and the ACT was documented as being therapeutic.  Through pursestrings placed in the ascending aorta and right atrium, the patient was cannulated and placed on bypass.  The coronaries were identified and the mammary artery and vein grafts were prepared for the distal anastomoses.  A cardioplegia cannula was placed and the patient was cooled to 28 degrees.  As the aortic crossclamp was applied, 500 cc of cold blood cardioplegia was delivered to the aortic root with immediate cardioplegic arrest  and septal temperature dropping to less than 12 degrees. Topical iced saline slush was used to augment myocardial preservation and a pericardial insulator pad was used to protect the left  phrenic nerve.  The distal coronary anastomoses were then performed.  The first distal anastomosis consisted of the sequential vein graft to the distal RCA continuing as a posterior descending.  The RCA had a proximal 95% ulcerated stenosis.  The side-to-side anastomosis to the distal right coronary artery was performed at the side branch of the vein with running 7-0 Prolene and there was good flow through the graft.  The second distal anastomosis was a continuation of this sequential vein to the posterior descending coronary artery.  This was a 1.5 mm vessel with proximal 80% stenosis and the end of the vein was sewn end-to-side with running 7-0 Prolene.  Cardioplegia was dosed through the vein graft and there was good flow.  The third distal anastomosis was a vein graft to the ramus intermediate.  This was a large 1.8 mm vessel with proximal 80% stenosis and a reversed saphenous vein was sewn with running 7-0 Prolene with good flow through the graft.  The fourth and fifth distal anastomoses consisted of a sequential vein graft to the OM1 and OM2.  The OM1 was a chronically occluded vessel 1.4 mm in diameter and a side-to-side anastomosis with the saphenous vein was sewn with running 7-0 Prolene.  The fifth distal anastomosis was a continuation of the sequential vein to the OM2.  This was a larger 1.8 mm vessel with proximal 60% stenosis and the end of the vein was sewn end-to-side with running 7-0 Prolene. Cardioplegia was redosed.  The sixth distal anastomosis was to the distal third of the LAD.  The LAD was deeply intramyocardial in the interventricular septum up to this point.  The left internal mammary artery pedicle was brought through an opening created in the left lateral pericardium and was brought down on the LAD and sewn end-to-side with a running 8-0 Prolene.  There was excellent flow through the anastomosis with immediate rise in the septal temperature after release of the  pedicle clamp on the mammary artery.  The  mammary pedicle was secured to the epicardium and the aortic crossclamp was removed.  The heart was cardioverted back to a regular rhythm.  Using a partial occlusion clamp, three proximal vein anastomosis were placed on the ascending aorta and the partial clamp was removed.  There was good flow through each graft and hemostasis was documented at the proximal and distal sites.  The patient was rewarmed to 37 degrees.  Temporary pacing wires were applied. When the patient was rewarmed, the lungs were re-expanded and the ventilator was resumed.  The patient was weaned from bypass without difficulty with stable cardiac output and blood pressure.  Protamine was administered and the cannulas were removed.  The mediastinum was irrigated with warm antibiotic irrigation.  The leg incision was irrigated and closed over a Jackson-Pratt drain drain. The pericardium was loosely reapproximated.  Two mediastinal and a left pleural chest tube were placed and brought out through separate incisions.  The sternum was reapproximated with interrupted steel wire.  The pectoralis fascia was closed with interrupted #1 Vicryl.  The subcutaneous and skin were closed with running Vicryl and sterile dressings were applied. Total bypass time was 170 minutes with aortic crossclamp time of 80 minutes. Dictated by:   Mikey Bussing, M.D. Attending Physician:  Mikey Bussing DD:  09/18/01  TD:  09/19/01 Job: [id number]

## 2011-05-01 NOTE — Cardiovascular Report (Signed)
[place]. [facility]  Patient:    [name], [name] Visit Number: [id number] MRN: [id number]          Service Type: MED Location: 2300 2314 01 Attending Physician:  Mikey Bussing Dictated by:   [name] [name], M.D. [facility] Proc. Date: 09/17/01 Admit Date:  09/17/2001   CC:         CV Laboratory  Kathlee Nations Suann Larry, M.D.  Angelena Sole, M.D. [facility]   Cardiac Catheterization  INDICATIONS: The patient is a 60 year old white male, who presents with chest pain. ECG reveals inferolateral ST elevation.  Because of this, he was given intravenous heparin and nitroglycerin. ST has improved slightly but he had continued residual pain and is brought to the catheterization lab for emergent evaluation.  PROCEDURES: 1. Emergency left heart catheterization. 2. Selective coronary arteriography. 3. Selective left ventriculography. 4. Subclavian angiography.  DESCRIPTION OF PROCEDURE: The patient was brought to the catheterization lab and prepped and draped in the usual fashion.  Xylocaine 1% was used for local anesthesia. Through an anterior puncture the right femoral artery was easily entered and a 6 French sheath was placed. Views of the left and right coronary arteries were obtained in multiple angiographic projections. We used a JL 3.0 guiding catheter to gain access to the left coronary artery. He tolerated the procedure well and there were no complications.  HEMODYNAMIC DATA: Central aortic pressure 139/92. LV pressure 137/15. No gradient on pullback across the aortic valve.  The left main coronary artery was very short and basically represented separate ostia.  The LAD had about an 80% area of stenosis beyond the small first diagonal. This vessel was a very large vessel and coursed to the apex.  The circumflex had a large intermediate with an 80% proximal stenosis. This provided a large portion of the anterolateral wall.  The AV circumflex had 30%  narrowing and provided a second marginal which is totally occluded in its midportion. There was a small tiny third marginal with about 80% distal narrowing and the AV circumflex had 50% narrowing.  The right coronary artery had an 80% proximal stenosis and 95% stenosis in the proximal portion.  Following this, there was a 60% stenosis.  There was a posterolateral branch that was totally occluded and a PDA that had about a 70% mid stenosis.  VENTRICULOGRAPHY: Ventriculography in the RAO projection reveals mild hypokinesis of the inferior segment.  Overall, ejection fraction was preserved at 50% or greater.  CONCLUSIONS: 1. Preserved overall left ventricular function with a mild inferior wall    motion abnormality. 2. Severe three-vessel coronary artery disease.  A surgical consultation has been obtained. Dr. Donata Clay will see the patient. ictated by:   [name] [name], M.D. [facility] Attending Physician:  Mikey Bussing DD:  09/17/01 TD:  09/19/01 Job: [id number]

## 2011-05-01 NOTE — Assessment & Plan Note (Signed)
[facility]                              CARDIOLOGY OFFICE NOTE   NAME:[name], [name]                         MRN:          [id number]  DATE:09/30/2006                            DOB:          [birth year]    CARDIOLOGIST:  Dr. Shawnie Pons.   HISTORY OF PRESENT ILLNESS:  [name] is a very pleasant 60 year old male  patient I saw back on September 24, 2006 with complaints of atypical chest  pain.  We set him up for a stress Myoview study.  This had to be changed  secondary to hypertension to an adenosine study.  There were some decreases  in inferior wall activity that seemed to be a little bit worse since 2004.  He is brought back in today for followup.  He has had only 1 other episode  of chest pain since then.  This occurred when he was bending over to pick  something up and seemed to go away after that.  He has been able to exert  himself without any chest symptoms.  He denies any syncope or pre-syncope.  Denies any orthopnea, paroxysmal nocturnal dyspnea, or dyspnea on exertion.  He is a little bit concerned about the elevations he had in his blood  pressure when he was seen for his stress test.  He is also concerned about  the abnormalities on the stress test and is eager to have an answer  regarding his symptoms and his test findings.   PAST MEDICAL HISTORY:  Coronary artery disease status post CABG October 2002  with LIMA to the LAD, vein graft to the ramus, vein graft to OM1 and OM2 and  vein graft to the RCA and PDA.  Good LV function with an ejection fraction  of 50%.  Hypertension.  Untreated hyperlipidemia.  Strong family history of  coronary artery disease.  Status post C spine surgery x3.  History of Nissen  fundoplication.   CURRENT MEDICATIONS:  1. Altace 2.5 mg a day.  2. Toprol XL 25 mg a day.  3. Aspirin 81 mg a day.  4. Multivitamin daily.  5. Flexeril.  6. Fish oil.  7. Ester-C.   ALLERGIES:  Penicillin.   SOCIAL  HISTORY:  The patient denies any tobacco abuse.   FAMILY HISTORY:  Significant for coronary artery disease.   REVIEW OF SYSTEMS:  Please see HPI.  Denies any fevers, chills, melena,  hematochezia, or hematuria.  Rest of the review of systems are negative.   PHYSICAL EXAM:  He is well-developed, well-nourished in no distress.  Blood pressure 118/82, pulse 80, weight 185 pounds.  HEENT:  Unremarkable.  NECK:  Without JVD.  CARDIAC:  S1 and S2.  Regular rate and rhythm.  LUNGS:  Clear to auscultation bilaterally.  ABDOMEN:  Soft and nontender.  Normoactive bowel sounds.  No organomegaly.  EXTREMITIES:  Without edema.  Calves are soft and nontender.  SKIN:  Warm and dry.  CAROTIDS:  Without bruits bilaterally.  NEUROLOGIC:  He is alert and oriented x3.  Cranial nerves 2-12 grossly  intact.  ENDOCRINE:  Without thyromegaly.   IMPRESSION:  1. Atypical chest pain.      a.     Abnormal Myoview study with inferior ischemia - slightly worse       than previous study in 2004.  2. Preserved left ventricular function with an ejection fraction 50%.  3. Coronary artery disease status post coronary artery bypass grafting      with grafts as noted above in 2002.  4. Hypertension.  5. Untreated dyslipidemia.  6. Strong family history of coronary artery disease.  7. Status post C spine surgery.  8. History of Nissen fundoplication.   PLAN:  I reviewed the patient's case today with Dr. Tenny Craw.  We also looked at  his Myoview study results.  Even though his symptoms are atypical he has  significant changes on his Myoview study.  This has been discussed with the  patient and we plan to proceed with JV cath.  Risks and benefits have been  explained to the patient's wife and he agrees to proceed.  He has had some  fluctuations in his blood pressure and I would like him to go up on his  Altace to 5 mg a day.  He had a recent blood work done that revealed a total  cholesterol 216, triglycerides 117, HDL  32, and LDL 156.  I have explained  to him the importance of Statin therapy.  He agrees to Pravachol 40 mg  nightly.  Will need to followup on lipids and LFTs in 6-8 weeks.  The  patient had several questions today and I tried to answer all of them for  him.  We will bring him back to the JV lab next week for study.  He will  continue on an aspirin a day.      ______________________________  Tereso Newcomer, PA-C    ______________________________  Pricilla Riffle, MD, [facility]    SW/MedQ  DD:  09/30/2006  DT:  09/30/2006  Job #:  [id number]

## 2011-05-01 NOTE — Assessment & Plan Note (Signed)
[facility]                            CARDIOLOGY OFFICE NOTE   NAME:Stephen, CARNELIUS [name]                         MRN:          [id number]  DATE:02/15/2007                            DOB:          1951-06-28    SUBJECTIVE:  [name] is in for a follow-up visit.  He is stable.  He  has not been having any ongoing chest pain or shortness of breath.  This  is the best that he has done in some time.  He feels relatively good.   MEDICATIONS:  1. Toprol XL 25 mg daily.  2. Enteric-coated aspirin 81 mg daily.  3. Multivitamin daily.  4. Fish oil q.d.  5. Ester-C q.d.  6. Pravachol 80 mg q.h.s.  7. Altace 5 mg p.o. b.i.d.   LABORATORY DATA:  Recent lipid profile was remarkable for an LDL of 102.  The patient has had prior Statin-related issues.   PHYSICAL EXAMINATION:  VITAL SIGNS:  Today the blood pressure is 112/88,  pulse 69.  LUNGS:  Fields are clear.  HEART:  Rhythm is regular.  EXTREMITIES:  No edema.  CHEST:  A median sternotomy is well-healed.   The electrocardiogram reveals a normal sinus rhythm with nonspecific T-  wave abnormality.   IMPRESSION:  1. Coronary artery disease with prior coronary artery bypass graft      surgery and anatomy as defined by Dr. Veverly Fells. Excell Seltzer on October 08, 2006.  2. Hypercholesterolemia.  3. History of Statin intolerance.   PLAN:  1. Continue his current medical regimen.  2. Return to the clinic in three months.  3. Notify us if there is any change in overall status.     [name]. [name], MD, [facility]  Electronically Signed    TDS/MedQ  DD: 02/15/2007  DT: 02/15/2007  Job #: [id number]

## 2011-05-01 NOTE — Procedures (Signed)
[place]. [facility]  Patient:    [name], [name]                         MRN: [id number] Proc. Date: 03/28/01 Adm. Date:  [id number] Disc. Date: [id number] Attending:  Estella Husk CC:         [place] Daphine Deutscher, M.D.   Procedure Report  PROCEDURE:  Esophageal manometry.  HISTORY:  This is a 60 year old gentleman with gastroesophageal reflux disease, who has been evaluated recently for symptoms refractory to medical therapy.  He sought alternative therapies and is for consideration of laparoscopic Nissen fundoplication.  As part of his preoperative evaluation, he is for esophageal manometry.  DESCRIPTION OF PROCEDURE:  Patient reported to the [facility] GI Laboratory March 28, 2001.  Manometry was performed in the standard manner.  The findings were as follows:  1. The upper esophageal sphincter demonstrated normal coordination and    relaxation. 2. The esophageal body demonstrated normal peristalsis as manifested by normal    wave amplitude and normal wave propagation. 3. The lower esophageal sphincter was hypotensive with a resting pressure of    8 mmHg.  However, normal relaxation with wet swallowing was demonstrated.  IMPRESSIVE:  Hypotensive lower esophageal sphincter pressure consistent with known reflux disease.  Otherwise normal manometry.  RECOMMENDATIONS:  Laparoscopic Nissen fundoplication per Dr. Daphine Deutscher. DD:  04/06/01 TD:  04/07/01 Job: [id number]

## 2011-05-01 NOTE — Assessment & Plan Note (Signed)
[facility]                            CARDIOLOGY OFFICE NOTE   NAME:[name]                         MRN:          [id number]  DATE:11/03/2006                            DOB:          1951-09-05    [name] is in for followup. We recently saw him, and I had him undergo  diagnostic cardiac catheterization, and this was done by Dr. Excell Seltzer. To  summarize, the patient underwent cardiac catheterization by Dr. Excell Seltzer  on October 26. This demonstrated that the LAD and circumflex had  separate ostia. There was a proximal diagonal branch that was small, had  an 80% stenosis. The LAD itself had a 40% proximal stenosis and a mild  amount of obstructive disease throughout its course. There was a 50%  stenosis of the ostial second diagonal which was also a small caliber  vessel. There was no high-grade obstructive disease throughout the  native LAD and there was no competitive flow seen in the LAD. The  circumflex was a medium caliber vessel and heavily diseased. The obtuse  marginal was totally occluded. The right was also occluded. The LIMA to  the LAD was atretic and there was no flow seen beyond the graft  anastomosis but fortunately the LAD itself was not critically diseased.  The saphenous vein graft that was sequenced to the right coronary artery  and the PDA was patent. The PDA portion was occluded at its anastomosis  and there was a trickle of flow down the atretic PDA seen in one view.  The RCA portion went to the two posterolateral branches which are open.  The saphenous vein graft to the ramus was widely patent and there was no  significant body in the vein graft or in the native ramus intermedius.  The saphenous vein graft to the OM was patent. There was a small OM that  was open. There was a second portion that spilled from before. The  patient clearly has had some angina and he is now on Pravachol as well  as Altace.   PHYSICAL EXAMINATION:   The blood pressure is 138/80, the pulse is 72,  the groin is healed.   We have reviewed all of the findings. A continued medical approach has  been recommended by Dr. Excell Seltzer and Dr. Excell Seltzer and I have reviewed the  films in detail together. I will plan to have him return for an exercise  tolerance test in a few weeks and we will reassess his status at that  time.     [name]. [name], MD, [facility]  Electronically Signed    TDS/MedQ  DD: 11/29/2006  DT: 11/30/2006  Job #: [id number]

## 2011-05-01 NOTE — Cardiovascular Report (Signed)
[name], [name] NO.:  [id number]   MEDICAL RECORD NO.:  [id number]          PATIENT TYPE:  OIB   LOCATION:  1965                         FACILITY:  [facility]   PHYSICIAN:  Veverly Fells. Excell Seltzer, MD  DATE OF BIRTH:  12/04/1951   DATE OF PROCEDURE:  DATE OF DISCHARGE:  10/08/2006                              CARDIAC CATHETERIZATION   DATE OF PROCEDURE:  October 08, 2006.   PROCEDURE:  1. Left heart catheterization.  2. Selective coronary angiography.  3. Left ventricular angiography.  4. Saphenous vein graft angiography.  5. LIMA angiography.   INDICATIONS:  [name] is a very nice 60 year old gentleman with known  coronary artery disease, status post coronary bypass surgery who presented  for evaluation of chest pain.  He underwent a cardiac functional study and  was found to have inferior wall ischemia.  He was subsequently referred for  diagnostic catheterization.   PROCEDURAL DETAILS:  Risks and indications of the procedure were explained  to the patient in detail.  Informed consent was obtained.  The right groin  was prepped, draped, and anesthetized with 1% lidocaine under normal sterile  conditions.  Using a modified Seldinger technique, a 4-French arterial  sheath was placed in the right common femoral artery.  Multiple angiographic  views of the left and right coronary arteries were taken.  For the left  coronary artery a 4-French JL4 catheter was used.  For the  right coronary  artery a 4-French 3-DRC catheter was used.  Following native vessel  angiography, a 3-DRC catheter was used to image the saphenous vein graft to  the distal right coronary artery as well as the saphenous vein graft to the  ramus intermedius.  This catheter would not reach the saphenous vein graft  to the obtuse marginal branches.  I then attempted to image the LIMA graft  with the 3-DRC, and it also would not selectively engage the LIMA.  A 4-  [place] LCD catheter was used to  selectively eject the saphenous vein graft  to the obtuse margins.  A 4-French LIMA catheter was then used to  selectively image the left internal mammary.  Following native and graft  angiography, a 4-French angled pigtail catheter was inserted into the left  ventricle, and left ventricular pressures were recorded.  A 30-degree RAO  left ventriculogram was performed.  A pullback across the aortic valve was  done.  All catheter exchange were performed over a guidewire.  There were no  immediate complications of the procedure.  The patient was transferred to  the holding area where his sheath will be pulled and  manual pressure used  for hemostasis.   FINDINGS:  Aortic pressure 112/68 with a mean of 87, left ventricular  pressure 110/3 with an end-diastolic pressure of 9.   CORONARY ANGIOGRAPHY:  The LAD and left circumflex have separate ostia.  The  LAD was selective engaged and demonstrated a medium caliber vessel coursing  down to the left ventricular apex.  It gives off two diagonal branches.  One  very proximal diagonal branch has a  small diameter and has an 80% stenosis  at its proximal portion.  The LAD has a 40% proximal stenosis and a mild  amount of obstructive disease throughout its course.  There is a 50%  stenosis of the ostial second diagonal which is a very small diameter  vessel.  There is no high-grade obstructive disease throughout the native  LAD.  There is no competitive flow seen in the native LAD.   The left circumflex is of medium caliber and heavily diseased.  It has 50%  disease in its mid portion.  It then becomes very ectatic and terminates in  a very small diameter vessel in the AV groove.  It gives off an obtuse  marginal branch that is 100% occluded.   The right coronary artery is occluded in its proximal portion.   The saphenous vein graft that is sequenced to the right coronary artery and  PDA is patent.  The PDA portion of the graft is occluded at the  anastomosis.  There is a trickle of flow down in the atretic PDA seen on one view.  There  is contrast hung up in the distal portion of the PDA graft.  The limb that  is anastomosed to the distal right coronary artery is patent, and there is  flow seen in a small terminal portion of the right coronary artery that  gives off two posterior lateral branches.   The saphenous vein graft to the ramus intermedius is widely patent.  There  is no significant disease in the body of the vein graft, or in the native  ramus intermedius.   The saphenous vein graft to the obtuse marginal branches is patent.  There  is one limb of the sequenced graft that appears open with a small OM vessel  parted.  There is no high-grade disease at the OM.  This vein graft has an  approximate 50% stenosis in its proximal body.  I do not see a second obtuse  marginal portion of this graft.  There is some retrograde filling of the  vessel that appears to come from the marginal branch that is patent.   The LIMA to the LAD is atretic.  There is no flow seen beyond the graft  anastomosis.   Left ventriculogram performed in a 30-degree RAO projection demonstrates  inferior wall hypokinesis and is otherwise normal.  The left ventricular  ejection fraction is estimated at 50%.   ASSESSMENT:  1. Severe native vessel coronary artery disease.  2. Status post coronary artery bypass grafting with three of six grafts      patent.  3. Nonobstructive LAD disease.  4. Mild segmental left ventricular dysfunction.   DISCUSSION:  The patient does not have any areas that appear to be amenable  to percutaneous coronary intervention.  He has very small diameter diffusely  diseased native coronary vessels.  There is a large mismatch between his  vein graft size and native coronaries.  His native LAD appears to have nonobstructive disease and is widely patent.  His other vessels and his  circumflex and right coronary artery are  diffusely diseased.  Recommend  continued medical therapy for his coronary artery disease.  Will review  these findings with the patient's primary cardiologist, Dr. Shawnie Pons.      Veverly Fells. Excell Seltzer, MD  Electronically Signed     MDC/MEDQ  D:  10/08/2006  T:  10/09/2006  Job:  [id number]   cc:   [name]. [name], MD, [facility]

## 2011-05-01 NOTE — Procedures (Signed)
[facility]                              EXERCISE TREADMILL   NAME:Batten, JAKORIAN MARENGO                         MRN:          [id number]  DATE:11/29/2006                            DOB:          09-23-1951    DURATION OF EXERCISE:  9 minutes, 10 seconds.  Maximum heart rate 120.  Percent of PMHR 72%.   COMMENTS:  Mr. Kraynak exercised today on the Bruce protocol.  Peak heart  rate reached 114 which is 72% of age predicted maximum and blood  pressure rose to 174, the diastolic pressure being  99.  He had mild  chest discomfort at peak exertion.  He did not have significant pain and  the pain was relieved by rest.  Resting electrocardiogram demonstrates  normal sinus rhythm with minor nonspecific ST abnormality.  At maximum  stress he did not have ventricular ectopy of significance, and  importantly did not have significant horizontal ST depression.  The  study was thought to be nondiagnostic, but compatible with a reasonable  exercise tolerance.   The patient has undergone recent catheterization.  The LAD is  segmentally plaqued but without critical disease and the LIMA is  atretic. He has 2 high-grade small diagonals.  In addition, the ramus is  grafted and patent and the OM is only partially seen.  The right is  totally occluded and the distal graft to the PD is occluded, although  the right portion goes to the posterolateral system.  The findings are  compatible with diffuse disease.   We will recommend that he get a liver and lipid profile.  He probably  needs to go ahead and use Pravachol 80 mg daily or some other option.  With regard to his stress findings, he is mildly hypertensive and might  benefit by better blood pressure control.  We have talked about various  options, including starting Altace at 5 mg b.i.d. as opposed to once a  day.  I have warned him about the side effects.  We will see him back in  followup in a few months and we have given  him an exercise prescription.     [name]. [name], MD, [facility]  Electronically Signed    TDS/MedQ  DD: 11/29/2006  DT: 11/30/2006  Job #: [id number]

## 2011-05-01 NOTE — Discharge Summary (Signed)
[place]. [facility]  Patient:    [name], [name] Visit Number: [id number] MRN: [id number]          Service Type: MED Location: [id number] Attending Physician:  Rollene Rotunda Dictated by:   Guy Franco, P.A.C. Admit Date:  09/25/2001 Disc. Date: 09/27/01                             Discharge Summary  DISCHARGE DIAGNOSES: 1. Chest pain. 2. Post-myocardial infarction/coronary artery bypass graft, pericardiectomy    syndrome. 3. Coronary artery disease, status post coronary artery bypass graft    September 18, 2001. 4. Gastroesophageal reflux disease.  MEDICATIONS: 1. Toprol XL 25 mg 1 p.o. q.d. 2. Altace 2.5 mg q.d. 3. Celebrex 200 mg q.d. 4. Tylox 1-2 q.4-6h. p.r.n. pain. 5. Guaifenesin.  ALLERGIES:  PENICILLIN causes swelling and a rash.  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  Positive history of tobacco abuse.  Occasional alcohol.  He is married with one son.  FAMILY HISTORY:  Extensive family history of coronary artery disease, paternal.  HOSPITAL COURSE:  [name] was admitted on September 15, 2001 with chest discomfort.  On September 18, 2001, he had a six vessel CABG after a significant inferior-posterior endocardial myocardial infarction.  Prior to that event, he had no known coronary artery disease.  He was taken to the cardiac catheterization lab by Dr. [name]. Stuckey on September 17, 2001 which revealed severe three vessel coronary artery disease, EF 50% with mild inferior hypokinesis.  At that time, LAD revealed 80% mid-lesion, left circumflex 30% proximal, OM2 100% stenosis, small OM3 with an 80% lesion.  Right coronary artery with an 80% proximal, 95% proximal, 60% mid.  PDA with a 70% mid, and PL 100% occluded.  On September 18, 2001, the patient underwent emergent CABG by Dr. Kathlee Nations Trigt III with the following grafts:  LIMA to the LAD, saphenous vein graft to the OM1 and OM2, saphenous vein graft to the  RI, saphenous vein graft to the RCA/PDA.  Postoperative course was relatively uncomplicated and the patient was discharged to home on September 24, 2001.  On the afternoon of admission, he had episodes of stabbing chest pain at rest with minimal shortness of breath.  He describes this pain as pleuritic in nature.  He called 911 and was transferred to [facility] for evaluation.  He was subsequently admitted for observation.  Lab studies during his hospital stay, revealed troponin 0.16, CK-MBs negative. Hemoglobin 8.7 with hematocrit 25.0.  Potassium 3.8, BUN 10, creatinine 0.9.  His post-hospital CBC revealed a hemoglobin of 7.9, hematocrit of 22.6 on September 21, 2001.  During the patients hospital stay, he continued to complain of pleuritic-type chest pain and 2-D echocardiogram did not reveal a significant pericardial effusion.  Due to increased d-dimer, the patient did undergo a CT of his chest which was negative for acute pulmonary embolus.  The patient was given Indocin during his hospitalization and did feel resolution of his pleuritic pain.  Therefore, he was discharged to home on September 27, 2001 on the following medications.  DISCHARGE MEDICATIONS: 1. Aspirin 325 mg q.d. 2. Toprol XL 25 mg q.d. 3. Humibid 600 mg 2 tablets q.d. 4. Altace 2.5 mg 1 p.o. q.d. 5. Tylox p.r.n. pain. 6. He was also started on Indocin 25 mg 1 p.o. t.i.d. x 1 week.  He is to take  this with food.  In addition, I will place him on Protonix 40 mg 1 p.o.    q.d. for one week while he is on Indocin.  ACTIVITY:  No lifting over 10 pounds.  Continue activity as described by surgeon on his discharge instructions.  DIET:  Low fat, low salt diet.  WOUND CARE:  He may shower and clean incisions with soap and water.  FOLLOW-UP:  He has an appointment with Dr. Kathlee Nations Trigt III on October 25,m 2002 at 11:30 a.m.  In the meantime, he is to return to Dr. Kathlee Nations Trigts office for  staple removal on October 03, 2001 at 9 a.m.  He is also to meet with Dian Queen, P.A.C. at [facility] on October 03, 2001 at 10:15 a.m. for exam and chest x-ray. Dictated by:   Guy Franco, P.A.C. Attending Physician:  Rollene Rotunda DD:  09/27/01 TD:  09/27/01 Job: [id number]

## 2011-05-01 NOTE — Discharge Summary (Signed)
[place]. [facility]  Patient:    [name], [name] Visit Number: [id number] MRN: [id number]          Service Type: MED Location: 2000 2006 01 Attending Physician:  Mikey Bussing Dictated by:   Adair Patter, P.A. Admit Date:  09/17/2001 Disc. Date: 09/24/01   CC:         Angelena Sole, M.D. [facility]  Maisie Fus D. [name], M.D. [facility]   Discharge Summary  DATE OF BIRTH:  04/12/1951.  ADMITTING DIAGNOSES: 1. Chest pain. 2. Coronary artery disease.  SECONDARY DIAGNOSES:  Gastroesophageal reflux disease.  DISCHARGE DIAGNOSIS:  Coronary artery disease.  HOSPITAL PROCEDURES: 1. Cardiac catheterization. 2. Emergency CABG x 6.  HOSPITAL COURSE:  Mr. Delisle is a patient with known coronary artery disease who developed severe epigastric and substernal chest pain on September 17, 2001. Because of his known history of coronary artery disease, he went for emergency cardiac catheterization which revealed he had significant coronary artery disease which was amenable to surgical intervention.  Because of this, Dr. Donata Clay was consulted.  On September 18, 2001, the patient underwent coronary artery bypass grafting x 6, with left internal mammary artery anastomosed to the left anterior descending artery, sequential saphenous vein graft to the first and second obtuse marginal arteries, saphenous vein graft to the right intermediate artery, and a sequential saphenous vein graft to the right coronary artery and posterior descending artery.  No complications were noted during the procedure.  Postoperatively, the patient remained hemodynamically stable throughout his entire hospital course.  Because of patients known history of coronary artery disease, he was started on Toprol and Altace.  His postoperative course remained relatively uneventful and he was subsequently discharged home in stable condition on September 24, 2001.  MEDICATIONS AT TIME OF DISCHARGE: 1. Tylox  1-2 tablets q.4-6h. as needed for pain. 2. Celebrex 200 mg 1 tablet daily. 3. Toprol XL 25 mg 1 daily. 4. Guaiphenesin 600 mg 1 tablet twice daily. 5. Altace 2.5 mg 1 daily.  DISCHARGE ACTIVITY:  The patient is to avoid driving, strenuous active, and lifting of heavy objects.  He is told to walk daily and he is continue to use his incentive spirometer every hour.  DISCHARGE DIET:  Low fat and low salt.  WOUND CARE:  The patient was told he could shower and clean his incision with soap and water.  DISPOSITION:  Home.  FOLLOWUP:  The patient was told to follow up at [facility] on October 03, 2001, at 10:15 a.m.  They will take a chest x-ray which he should bring to his followup appointment with Dr. Donata Clay, which is on Friday, October 07, 2001, at 11:30 a.m.  In addition to those appointments, the patient will go to the CVTS office on Monday, October 03, 2001, at 9 a.m. for staple removal. Dictated by:   Adair Patter, P.A. Attending Physician:  Mikey Bussing DD:  09/23/01 TD:  09/23/01 Job: [id number]
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
## 2011-05-01 NOTE — Op Note (Signed)
[facility]  Patient:    MERWIN, BREDEN                         MRN: [id number] Proc. Date: 04/22/01 Adm. Date:  [id number] Attending:  Katha Cabal CC:         Angelena Sole, M.D. [facility]  Wilhemina Bonito. Eda Keys., M.D. [facility]   Operative Report  PREOPERATIVE DIAGNOSIS:  Gastroesophageal reflux disease.  POSTOPERATIVE DIAGNOSES:  Small hiatal hernia with gastroesophageal reflux disease.  PROCEDURE:  Laparoscopic Nissen fundoplication over a #56 dilator with closure of the crura (3).  SURGEON:  Thornton Park. Daphine Deutscher, M.D.  ASSISTANT:  Angelia Mould. Derrell Lolling, M.D.  PREOPERATIVE INDICATIONS:  Zayyan Mullen is a 60 year old man, who has had reflux since he was a teenager, when he initially managed it with Tums.  Recently he has become refractory to medications, despite taking Nexium 40 mg twice a day. He was seen and evaluated thoroughly by Dr. Yancey Flemings, who has endoscoped him and who has done esophageal manometry showing basically a hypotensive lower esophageal sphincter but otherwise a normal peristaltic propagation.  DESCRIPTION OF PROCEDURE:  Bethany Cumming was taken to room one on Friday, Apr 22, 2001, and after general anesthesia, the abdomen was prepped with Betadine and draped sterilely.  A supraumbilical longitudinal incision was made, through which a pursestring suture, the Hasson cannula was introduced.  The abdomen was insufflated.  A 5 mm port was placed in the upper midline to retract the liver, two 10-11s on the right side and one on the left enabled [place] to perform the procedure.  As depicted in the chart, you could see his hiatal hernia which is readily demonstrated in the photographs.  It was moderate sized. This was reduced as I incised the peritoneal reflection and then dissected the right crus followed by the esophagogastric junction and then followed by the left crus.  I subsequently was able to identify the posterior vagus and then behind that,  was a large fat pad that seemed to be coming from behind the stomach.  I ended up dissecting that free and removing it.  This was depicted in the chart also.  After a good dissection of the esophagogastric junction with mobilization of both crura, I then repaired the hiatal hernia with three sutures using the EndoStitch posteriorly which snugged this up nicely.  The 56 lighted dilator was then passed and then with that in place, the wrap was brought around.  A contiguous portion of the stomach was used to actually create the invagination of the esophagus by the proximal cardia and fundus. The fundoplication was sutured to the esophagus superiorly with an extra corporeal tie being placed followed by two intracorporeal sutures to complete the wrap.  All of these knots had Endo Clips placed on them.  I will mention that I did mobilize the cardia nicely, and we did take down the short gastrics and mobilized these off the spleen.  No bleeding was encountered.  At the completion, a nice, healthy wrap was present.  There were no bleeding points or injuries noted.  All port sites were inspected.  The Hasson port was tied down under direct videoscopic visualization.  The wounds were injected with Marcaine.  AFter the abdomen was deflated, the skin was closed with 4-0 Vicryl subcutaneously with Benzoin and Steri-Strips on the skin.  The patient seemed to tolerate the procedure well, and he was taken to the recovery room in  satisfactory condition.  He was admitted for observation. DD:  04/22/01 TD:  04/23/01 Job: [id number]

## 2011-05-01 NOTE — Procedures (Signed)
[facility]                              EXERCISE TREADMILL   NAME:[name], Stephen MONFORTE                         MRN:          [id number]  DATE:11/29/2006                            DOB:          07/25/1951    EXERCISE TOLERANCE TEST   DURATION OF EXERCISE:  9 minutes.   Maximum heart rate achieved was 120 or 72%.  MET level 10.3.   COMMENTS:  The patient exercised today on the Bruce Protocol.  Exercise  tolerance was fair.  He experienced mild chest pain at peak exercise.  Resting electrocardiogram demonstrates normal sinus rhythm with minor  nonspecific ST flattening.  At peak stress, there was some J-point  depression, but no significant horizontal ST depression.  The study was  thought to be abnormal due to the presence of chest discomfort with  maximum exertion.   The patient underwent revascularization surgery.  He now has some  recurrent problems.  Specifically, the saphenous vein graft to the right  coronary artery is patent, but the PDA portion of the graft is occluded  at the anastomosis and there is trickle flow down the atretic PDA.  The  distal terminal portion of the right coronary artery gives off 2  posterolateral branches, which are small in caliber.  Fortunately, the  ramus intermedius is patent, the saphenous vein graft to the obtuse  marginals is patent.  There is a 50% stenosis in the proximal body, and  the LIMA is atretic with no flow seen beyond the graft anastomosis.  The  LAD proper is a medium caliber vessel with 2 diagonal branches, 1 very  proximal with an 80% stenosis.  The LAD itself has 40% proximal stenosis  and moderate obstructive disease throughout its course.  Following  catheterization, Dr. Excell Seltzer recommended continued medical treatment.  At  the present time, the patient is continuing on this pathway.  Optimization of his lipid lowering therapy will be important, but he has  had problems with the drugs in the past.   We will make appropriate  adjustments.     [name]. [name], MD, [facility]  Electronically Signed    TDS/MedQ  DD: 12/01/2006  DT: 12/01/2006  Job #: [id number]

## 2011-05-04 ENCOUNTER — Telehealth: Payer: Self-pay | Admitting: Family Medicine

## 2011-05-07 ENCOUNTER — Telehealth: Payer: Self-pay | Admitting: *Deleted

## 2011-05-07 NOTE — Telephone Encounter (Signed)
Prior auth approved for celebrex 200 mg from 03-05-11 through 05-04-12, Approval letter scan to chart.

## 2011-05-20 NOTE — Telephone Encounter (Signed)
noted 

## 2011-05-27 ENCOUNTER — Other Ambulatory Visit: Payer: Self-pay | Admitting: Family Medicine

## 2011-05-27 MED ORDER — TIZANIDINE HCL 4 MG PO CAPS: 4.0000 mg | ORAL_CAPSULE | Freq: Two times a day (BID) | ORAL | Status: DC | PRN

## 2011-05-27 NOTE — Telephone Encounter (Signed)
RX faxed    KP 

## 2011-06-01 ENCOUNTER — Telehealth: Payer: Self-pay | Admitting: Cardiology

## 2011-06-01 NOTE — Telephone Encounter (Signed)
Pt wants to know if he needs blood work before appt on 6-20

## 2011-06-01 NOTE — Telephone Encounter (Signed)
I spoke with the pt and made him aware that per the documentation from March 2012 labs with Dr Laury Axon he is due for a lipid and liver profile.  The pt will come to his appt with Dr [name] fasting and have labs drawn after his appointment per Dr Rosalyn Charters orders.

## 2011-06-03 ENCOUNTER — Ambulatory Visit (INDEPENDENT_AMBULATORY_CARE_PROVIDER_SITE_OTHER): Payer: Federal, State, Local not specified - PPO | Admitting: Cardiology

## 2011-06-03 ENCOUNTER — Encounter: Payer: Self-pay | Admitting: Cardiology

## 2011-06-03 DIAGNOSIS — Z136 Encounter for screening for cardiovascular disorders: Secondary | ICD-10-CM

## 2011-06-03 DIAGNOSIS — I714 Abdominal aortic aneurysm, without rupture, unspecified: Secondary | ICD-10-CM

## 2011-06-03 DIAGNOSIS — I10 Essential (primary) hypertension: Secondary | ICD-10-CM

## 2011-06-03 DIAGNOSIS — E785 Hyperlipidemia, unspecified: Secondary | ICD-10-CM

## 2011-06-03 DIAGNOSIS — I25810 Atherosclerosis of coronary artery bypass graft(s) without angina pectoris: Secondary | ICD-10-CM

## 2011-06-03 LAB — LIPID PANEL
Cholesterol: 198 mg/dL (ref 0–200)
HDL: 50.1 mg/dL (ref >39.00)
LDL Cholesterol: 126 mg/dL — ABNORMAL HIGH (ref 0–99)
Total CHOL/HDL Ratio: 4
Triglycerides: 112 mg/dL (ref 0.0–149.0)
VLDL: 22.4 mg/dL (ref 0.0–40.0)

## 2011-06-03 LAB — HEPATIC FUNCTION PANEL
ALT: 22 U/L (ref 0–53)
AST: 22 U/L (ref 0–37)
Albumin: 5 g/dL (ref 3.5–5.2)
Alkaline Phosphatase: 41 U/L (ref 39–117)
Bilirubin, Direct: 0.2 mg/dL (ref 0.0–0.3)
Total Bilirubin: 0.7 mg/dL (ref 0.3–1.2)
Total Protein: 7.3 g/dL (ref 6.0–8.3)

## 2011-06-03 NOTE — Patient Instructions (Signed)
Your physician wants you to follow-up in: 6 months. You will receive a reminder letter in the mail two months in advance. If you don't receive a letter, please call our office to schedule the follow-up appointment.  You had lab work done today. 

## 2011-06-08 ENCOUNTER — Other Ambulatory Visit: Payer: Self-pay | Admitting: Family Medicine

## 2011-06-08 DIAGNOSIS — F419 Anxiety disorder, unspecified: Secondary | ICD-10-CM

## 2011-06-08 MED ORDER — CLONAZEPAM 1 MG PO TABS: ORAL_TABLET | ORAL | Status: DC

## 2011-06-08 MED ORDER — CLONAZEPAM 0.5 MG PO TABS: ORAL_TABLET | ORAL | Status: DC

## 2011-06-08 NOTE — Telephone Encounter (Signed)
Faxed.   KP 

## 2011-06-08 NOTE — Telephone Encounter (Signed)
Last seen 03/02/11 for CPX and filled 02/18/11 patient requested 3 additional refills . Please advise    KP

## 2011-06-09 ENCOUNTER — Encounter: Payer: Self-pay | Admitting: Internal Medicine

## 2011-06-09 ENCOUNTER — Ambulatory Visit (INDEPENDENT_AMBULATORY_CARE_PROVIDER_SITE_OTHER): Payer: Federal, State, Local not specified - PPO | Admitting: Internal Medicine

## 2011-06-09 VITALS — BP 110/80 | Temp 98.5°F | Wt 181.0 lb

## 2011-06-09 DIAGNOSIS — J029 Acute pharyngitis, unspecified: Secondary | ICD-10-CM

## 2011-06-09 DIAGNOSIS — J988 Other specified respiratory disorders: Secondary | ICD-10-CM

## 2011-06-09 MED ORDER — AZITHROMYCIN 250 MG PO TABS: ORAL_TABLET | ORAL | Status: AC

## 2011-06-09 MED ORDER — AZITHROMYCIN 250 MG PO TABS: ORAL_TABLET | ORAL | Status: DC

## 2011-06-09 MED ORDER — HYDROCODONE-HOMATROPINE 5-1.5 MG/5ML PO SYRP: 5.0000 mL | ORAL_SOLUTION | Freq: Four times a day (QID) | ORAL | Status: AC | PRN

## 2011-06-09 NOTE — Progress Notes (Signed)
Addended by: Doristine Devoid on: 06/09/2011 05:31 PM   Modules accepted: Orders

## 2011-06-09 NOTE — Progress Notes (Signed)
  Subjective:    Patient ID: [name], male    DOB: March 02, 1951, 60 y.o.   MRN: [id number]  HPI Respiratory tract infection Onset/symptoms:6/21 as raspy ST, sweats Exposures (illness/environmental/extrinsic):wife was ill with hospital acquired infection ([facility]) Progression of symptoms:worsening ST Treatments/response:rest, NSAIDS Present symptoms:ST, cough with clear sputum Fever/chills/sweats:sweats every time he goes to bed Frontal headache:yes Facial pain:no Nasal purulence:scant yellow Dental pain:no Lymphadenopathy:no Wheezing/shortness of breath:slight wheezing Pleuritic pain:no Associated extrinsic/allergic symptoms:itchy eyes/ sneezing:no Past medical history: Seasonal allergies; no;asthma:no Smoking history:quit 1997           Review of Systems     Objective:   Physical Exam Gen.: Thin but healthy and well-nourished in appearance. Alert, appropriate and cooperative throughout exam. Eyes: No corneal or conjunctival inflammation noted.  Ears: External  ear exam reveals no significant lesions or deformities. Canals clear .TMs normal. Hearing is grossly normal bilaterally. Nose: External nasal exam reveals no deformity or inflammation. Nasal mucosa are pink and moist. No lesions or exudates noted. Mouth: Oral mucosa and oropharynx reveal no lesions or exudates. Teeth in good repair. Neck: No deformities, masses, or tenderness noted.  Thyroid  Non tender. Lungs: Normal respiratory effort; chest expands symmetrically. Lungs are clear to auscultation without rales, wheezes, or increased work of breathing. Heart: Normal rate and rhythm. Normal S1 and S2. No gallop, click, or rub. S4 with slurring. No clubbing, cyanosis, edema, or deformity noted. Nail health  good. Neurologic: Alert and oriented x3. Skin: Intact without suspicious lesions or rashes. Lymph: No cervical, axillary  lymphadenopathy present. Psych: Mood and affect are normal. Normally interactive                                                                                          Assessment & Plan:

## 2011-06-09 NOTE — Patient Instructions (Signed)
Plain Mucinex for thick secretions ;force NON dairy fluids for next 48 hrs. Use a Neti pot daily as needed for sinus congestion 

## 2011-06-17 NOTE — Assessment & Plan Note (Signed)
Currently not elevated.  Patient is also on celebrex, and is aware of issue regarding this.

## 2011-06-17 NOTE — Assessment & Plan Note (Signed)
Would seem appropriate to follow and repeat in 2013.

## 2011-06-17 NOTE — Assessment & Plan Note (Signed)
Appropriate to repeat lipid and liver profile at this point in time.

## 2011-06-17 NOTE — Assessment & Plan Note (Signed)
Symptoms remain stable at the present time.  No change in medication.

## 2011-06-17 NOTE — Progress Notes (Signed)
HPI:  Doing pretty well. Going to retire, and start doing some day trading.  He feels confident that he can do it successfully.  No change in symptoms pattern, which has been stable recently.   Current Outpatient Prescriptions  Medication Sig Dispense Refill  . aspirin 81 MG EC tablet Take 81 mg by mouth daily.        . celecoxib (CELEBREX) 200 MG capsule Take 2 capsules (400 mg total) by mouth daily.  180 capsule  3  . Coenzyme Q10 (EQL COQ10) 300 MG CAPS Take by mouth. 1 cap daily       . Docusate Calcium (STOOL SOFTENER PO) Take by mouth. As needed       . dutasteride (AVODART) 0.5 MG capsule Take 1 capsule (0.5 mg total) by mouth daily.  90 capsule  3  . fenofibrate 160 MG tablet Take 1 tablet (160 mg total) by mouth daily.  90 tablet  3  . HYDROcodone-acetaminophen (VICODIN ES) 7.5-750 MG per tablet Take 1 tablet by mouth every 6 (six) hours as needed.        . hydrocortisone 2.5 % ointment Apply topically 2 (two) times daily.  30 g  1  . LORazepam (ATIVAN) 0.5 MG tablet Take 0.5 mg by mouth 3 (three) times daily as needed.        . metoprolol succinate (TOPROL-XL) 25 MG 24 hr tablet Take 1 tablet (25 mg total) by mouth 2 (two) times daily.  180 tablet  3  . mirtazapine (REMERON) 30 MG tablet Take 30 mg by mouth at bedtime.        . nitroGLYCERIN (NITROSTAT) 0.4 MG SL tablet Place 0.4 mg under the tongue every 5 (five) minutes as needed. for chest pain       . OMEGA-3 KRILL OIL 300 MG CAPS Take by mouth. once a day       . pravastatin (PRAVACHOL) 80 MG tablet Take 1 tablet (80 mg total) by mouth at bedtime.  90 tablet  3  . ramipril (ALTACE) 5 MG capsule Take 1 capsule (5 mg total) by mouth 2 (two) times daily.  180 capsule  3  . traMADol (ULTRAM) 50 MG tablet Take 50 mg by mouth as needed. for pain       . Turmeric 500 MG CAPS Take by mouth. once daily       . valACYclovir (VALTREX) 500 MG tablet TAKE 1 TABLET TWICE A DAY AS NEEDED  60 tablet  0  . clonazePAM (KLONOPIN) 0.5 MG tablet 3  tablets daily at bedtime  270 tablet  0  . HYDROcodone-homatropine (HYDROMET) 5-1.5 MG/5ML syrup Take 5 mLs by mouth every 6 (six) hours as needed for cough.  120 mL  0   Current Facility-Administered Medications  Medication Dose Route Frequency Provider Last Rate Last Dose  . 0.9 %  sodium chloride infusion  500 mL Intravenous Continuous Yancey Flemings, MD        Allergies  Allergen Reactions  . Isosorbide Mononitrate     REACTION: migraine type headache  . Penicillins     REACTION: SENSITIVE TO TOUCH    Past Medical History  Diagnosis Date  . Adenomatous colon polyp   . CAD (coronary artery disease)   . Coronary atherosclerosis of artery bypass graft   . Other and unspecified hyperlipidemia   . Syncope and collapse   . Unspecified essential hypertension   . Abdominal aneurysm without mention of rupture   . Diarrhea of presumed infectious  origin   . Esophageal reflux   . Personal history of colonic polyps   . Fever, unspecified   . Routine general medical examination at a health care facility   . Anal fissure     Past Surgical History  Procedure Date  . Cervical laminectomy   . Hiatal hernia repair   . Heart bypass     x6  . Cardiac catheterization 06-05-10  . Colonoscopy   . Polypectomy     Family History  Problem Relation Age of Onset  . Alcohol abuse Mother   . Colon cancer Neg Hx     History   Social History  . Marital Status: Married    Spouse Name: N/A    Number of Children: 5  . Years of Education: N/A   Occupational History  . [employer]LECT[employer]IC [employer] [place] [employer]   Social History Main Topics  . Smoking status: Former Games developer  . Smokeless tobacco: Not on file  . Alcohol Use: Yes     wine daily  . Drug Use: No  . Sexually Active: Not on file   Other Topics Concern  . Not on file   Social History Narrative   Daily caffeine    ROS: Please see the HPI.  All other systems reviewed and negative.  PHYSICAL EXAM:  BP 109/76  Pulse 59  Resp 16   Ht 6\' 2"  (1.88 m)  Wt 182 lb (82.555 kg)  BMI 23.37 kg/m2  General: Well developed, well nourished, in no acute distress. Head:  Normocephalic and atraumatic. Neck: no JVD Lungs: Clear to auscultation and percussion. Heart: Normal S1 and S2.  No murmur, rubs or gallops.  Abdomen:  Normal bowel sounds; soft; non tender; no organomegaly Pulses: Pulses normal in all 4 extremities. Extremities: No clubbing or cyanosis. No edema. Neurologic: Alert and oriented x 3.  EKG:  NSR.  First degree AV block.   Nonspecific T wave abnormality.  ASSESSMENT AND PLAN:

## 2011-06-29 ENCOUNTER — Other Ambulatory Visit: Payer: Self-pay | Admitting: Family Medicine

## 2011-07-03 ENCOUNTER — Other Ambulatory Visit: Payer: Self-pay | Admitting: *Deleted

## 2011-07-03 MED ORDER — NITROGLYCERIN 0.4 MG SL SUBL: 0.4000 mg | SUBLINGUAL_TABLET | SUBLINGUAL | Status: DC | PRN

## 2011-07-09 ENCOUNTER — Ambulatory Visit (INDEPENDENT_AMBULATORY_CARE_PROVIDER_SITE_OTHER): Payer: Federal, State, Local not specified - PPO | Admitting: Internal Medicine

## 2011-07-09 ENCOUNTER — Ambulatory Visit (HOSPITAL_BASED_OUTPATIENT_CLINIC_OR_DEPARTMENT_OTHER)
Admission: RE | Admit: 2011-07-09 | Discharge: 2011-07-09 | Disposition: A | Discharge status: Home / Self Care | Payer: Federal, State, Local not specified - PPO | Source: Ambulatory Visit | Attending: Internal Medicine | Admitting: Internal Medicine

## 2011-07-09 ENCOUNTER — Encounter: Payer: Self-pay | Admitting: Internal Medicine

## 2011-07-09 VITALS — BP 130/84 | HR 68 | Temp 98.1°F | Wt 183.6 lb

## 2011-07-09 DIAGNOSIS — R519 Headache, unspecified: Secondary | ICD-10-CM

## 2011-07-09 DIAGNOSIS — H5710 Ocular pain, unspecified eye: Secondary | ICD-10-CM | POA: Insufficient documentation

## 2011-07-09 DIAGNOSIS — R51 Headache: Secondary | ICD-10-CM

## 2011-07-09 NOTE — Assessment & Plan Note (Signed)
One-day history of facial pain, initially in the left eye and now around the left ear. On exam there is no evidence of otitis media. He does have some blisters in the left corner of the mouth. The nature of the pain seems neuropathic (very sensitive to touch) DDX: neuralgia, herpetic infection, sinusitis, etc. Plan: CT of the sinuses to rule out a deep infection Valtrex 1 g 3 times a day for one week Call immediately if the rash extends, he has fever cough ear discharge.

## 2011-07-09 NOTE — Patient Instructions (Signed)
Valtrex 1 g 3 times a day for one week Call if symptoms are  severe, fever, ear discharge or increase rash. Also call if you have eye redness or irritation, blurred vision.

## 2011-07-09 NOTE — Progress Notes (Signed)
  Subjective:    Patient ID: [name], male    DOB: 03-07-1951, 60 y.o.   MRN: [id number]  HPI Developed facial pain yesterday, located in the "socket" of the left eye. He also had the feeling that he was developing cold sores at the angle of the mouth on the left and started Valtrex. This morning, the pain has moved from the left eye to around the left ear and is described as "if a baseball hit you". The area is very sensitive to touch; this afternoon the pain is decreased compared to this AM. He is afraid that symptoms may represent a stroke  Past Medical History  Diagnosis Date  . Adenomatous colon polyp   . CAD (coronary artery disease)   . Coronary atherosclerosis of artery bypass graft   . Other and unspecified hyperlipidemia   . Syncope and collapse   . Unspecified essential hypertension   . Abdominal aneurysm without mention of rupture   . Diarrhea of presumed infectious origin   . Esophageal reflux   . Personal history of colonic polyps   . Fever, unspecified   . Routine general medical examination at a health care facility   . Anal fissure    Past Surgical History  Procedure Date  . Cervical laminectomy   . Hiatal hernia repair   . Heart bypass     x6  . Cardiac catheterization 06-05-10  . Colonoscopy   . Polypectomy       Review of Systems No fever, no sneezing, no itchy eyes or itchy nose. No cough or chest congestion No fever but his temperature is usually lower than 98.1. No decrease in the hearing, he has chronic tinnitus and that has not changed. Denies any facial numbness, facial paralysis, slurred speech or motor deficits.     Objective:   Physical Exam  Constitutional: He is oriented to person, place, and time. He appears well-developed and well-nourished. No distress.  HENT:  Head: Normocephalic and atraumatic.  Right Ear: External ear normal.  Left Ear: External ear normal.  Mouth/Throat: No oropharyngeal exudate.       Percussion of the  teeth causes no pain. No rash in the ears, no blisters. Inspection and palpation of  ear and surrounding tissue is normal   except for the fact that is very sensitive to touch. He has several minute blister at the corner of the mouth in the L  Eyes: Conjunctivae and EOM are normal. Pupils are equal, round, and reactive to light. Right eye exhibits no discharge. Left eye exhibits no discharge.  Musculoskeletal: He exhibits no edema.  Lymphadenopathy:    He has no cervical adenopathy.  Neurological: He is alert and oriented to person, place, and time.       Motor exam is symmetric except for a frequent tic at the musculature of the face which is nothing new according to the patient; speech is fluid  Skin: Skin is warm and dry. He is not diaphoretic.  Psychiatric: He has a normal mood and affect. His behavior is normal. Judgment and thought content normal.          Assessment & Plan:

## 2011-07-13 ENCOUNTER — Telehealth: Payer: Self-pay | Admitting: *Deleted

## 2011-07-13 NOTE — Telephone Encounter (Signed)
Noted  

## 2011-07-13 NOTE — Telephone Encounter (Signed)
Message copied by Leanne Lovely on Mon Jul 13, 2011  1:06 PM ------      Message from: Leanne Lovely      Created: Fri Jul 10, 2011 11:42 AM         Check on pt, facial pain better?

## 2011-07-13 NOTE — Telephone Encounter (Signed)
I spoke w/ pt he states that that pain is getting better, not as intense. Advised pt if not completely better to let us know.

## 2011-07-16 ENCOUNTER — Telehealth: Payer: Self-pay | Admitting: Family Medicine

## 2011-07-16 NOTE — Telephone Encounter (Signed)
Per Dr.Hopper-- needs OV or needs to go to UC. I spoke w/ pts wife she is aware- made an appt for tomorrow am.

## 2011-07-16 NOTE — Telephone Encounter (Signed)
Pt seen Dr Drue Novel on 7-26-12Pt c/o constant pain, and tenderness in left eye. Pt denies any swelling, blurred vision, or discharge from eye. Pt notes that cold sore has since resolved..Please advise   Ok to speak with Pt wife [name].

## 2011-07-16 NOTE — Telephone Encounter (Signed)
Patient was seen last week - he said eye was feeling better but now it seems to be sensitive - he will take last veltrex today - [facility]

## 2011-07-17 ENCOUNTER — Encounter: Payer: Self-pay | Admitting: Internal Medicine

## 2011-07-17 ENCOUNTER — Ambulatory Visit (INDEPENDENT_AMBULATORY_CARE_PROVIDER_SITE_OTHER): Payer: Federal, State, Local not specified - PPO | Admitting: Internal Medicine

## 2011-07-17 VITALS — BP 114/80 | HR 55 | Temp 97.9°F | Wt 184.0 lb

## 2011-07-17 DIAGNOSIS — H9313 Tinnitus, bilateral: Secondary | ICD-10-CM

## 2011-07-17 DIAGNOSIS — H9319 Tinnitus, unspecified ear: Secondary | ICD-10-CM

## 2011-07-17 DIAGNOSIS — G518 Other disorders of facial nerve: Secondary | ICD-10-CM

## 2011-07-17 MED ORDER — GABAPENTIN 100 MG PO CAPS: 100.0000 mg | ORAL_CAPSULE | ORAL | Status: DC

## 2011-07-17 NOTE — Patient Instructions (Signed)
To ER if pain recurs or  is associaled with Warning Signs as discussed (visual change, pus, fever) .  Annual Ophthalmologic exam

## 2011-07-17 NOTE — Progress Notes (Signed)
  Subjective:    Patient ID: [name], male    DOB: [birth year], 60 y.o.   MRN: [id number]  HPI Mr. Schreiner returns with tenderness around the left eye and temple area. The pain which appeared suddenly upon awakening 7/25 has resolved after 7 days of Valtrex 3 grams/ day. The tenderness around the ear has resolved.  The office visit of 7/26 and the CAT scan were reviewed. Copies of his CAT scans were provided to him.  He denies fever, chills, sweats, or rash. He has chronic tinnitus but no new change or hearing loss. He denies blurred vision, double vision, or visual loss.      Review of Systems     Objective:   Physical Exam he is thin, but healthy in appearance. He is in no acute distress.  Pupils were equal round and reactive to light. There is minimal ptosis on the left which is barely discernible. Extraocular motions intact. Field of vision is normal. There is no evidence of conjunctivitis. The wall chart is read at 6 feet without lenses.  Tympanic membranes are slightly dull; there are no lesions present. Tuning  fork exam lateralizes to the left. Air conduction is greater than bone conduction bilaterally. Hearing is intact to whisper bilaterally  There is slight hypersensitivity to light touch over the left face.  Cranial nerve exam is otherwise unremarkable. No tongue deviation.  No carotid bruits are present. Heart rhythm is regular without significant murmur.  Degenerative reflexes and strength are normal. Gait, Romberg testing and finger to nose are all normal.           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 No rash to suggest zoster. He has residual hypersensitivity  Plan: See orders and recommendations.

## 2011-07-21 ENCOUNTER — Ambulatory Visit: Payer: Federal, State, Local not specified - PPO | Admitting: Internal Medicine

## 2011-08-14 ENCOUNTER — Other Ambulatory Visit: Payer: Self-pay | Admitting: Family Medicine

## 2011-08-14 DIAGNOSIS — G518 Other disorders of facial nerve: Secondary | ICD-10-CM

## 2011-08-14 MED ORDER — GABAPENTIN 100 MG PO CAPS: 100.0000 mg | ORAL_CAPSULE | ORAL | Status: DC

## 2011-08-31 ENCOUNTER — Other Ambulatory Visit: Payer: Self-pay | Admitting: Family Medicine

## 2011-09-18 ENCOUNTER — Other Ambulatory Visit: Payer: Self-pay | Admitting: Family Medicine

## 2011-09-18 MED ORDER — CLONAZEPAM 0.5 MG PO TABS: ORAL_TABLET | ORAL | Status: DC

## 2011-09-18 MED ORDER — MECLIZINE HCL 25 MG PO TABS: 25.0000 mg | ORAL_TABLET | Freq: Three times a day (TID) | ORAL | Status: DC | PRN

## 2011-09-18 MED ORDER — PRAVASTATIN SODIUM 80 MG PO TABS: 80.0000 mg | ORAL_TABLET | Freq: Every day | ORAL | Status: DC

## 2011-09-18 NOTE — Telephone Encounter (Signed)
Last filled 06/08/11 and seen 8/3/2 please advise    KP

## 2011-09-18 NOTE — Telephone Encounter (Signed)
Faxed clonazpem     KP

## 2011-10-13 ENCOUNTER — Other Ambulatory Visit: Payer: Self-pay | Admitting: Family Medicine

## 2011-10-13 MED ORDER — TIZANIDINE HCL 4 MG PO CAPS: 4.0000 mg | ORAL_CAPSULE | Freq: Two times a day (BID) | ORAL | Status: DC | PRN

## 2011-10-13 NOTE — Telephone Encounter (Signed)
Last seen 07/17/11 and filled 05/27/11 please advise     KP

## 2011-11-06 ENCOUNTER — Other Ambulatory Visit: Payer: Self-pay | Admitting: Family Medicine

## 2011-11-10 MED ORDER — CLONAZEPAM 0.5 MG PO TABS: ORAL_TABLET | ORAL | Status: DC

## 2011-11-10 NOTE — Telephone Encounter (Signed)
Last seen 07/17/11 and filled 09/18/11 # [id number]...Marland KitchenMarland Kitchen please advise    KP

## 2011-11-30 ENCOUNTER — Other Ambulatory Visit: Payer: Self-pay | Admitting: Family Medicine

## 2011-12-03 ENCOUNTER — Ambulatory Visit (INDEPENDENT_AMBULATORY_CARE_PROVIDER_SITE_OTHER): Payer: Federal, State, Local not specified - PPO

## 2011-12-03 DIAGNOSIS — Z23 Encounter for immunization: Secondary | ICD-10-CM

## 2011-12-22 ENCOUNTER — Ambulatory Visit (INDEPENDENT_AMBULATORY_CARE_PROVIDER_SITE_OTHER): Payer: Federal, State, Local not specified - PPO | Admitting: Cardiology

## 2011-12-22 ENCOUNTER — Encounter: Payer: Self-pay | Admitting: Cardiology

## 2011-12-22 DIAGNOSIS — I714 Abdominal aortic aneurysm, without rupture, unspecified: Secondary | ICD-10-CM

## 2011-12-22 DIAGNOSIS — I2510 Atherosclerotic heart disease of native coronary artery without angina pectoris: Secondary | ICD-10-CM

## 2011-12-22 DIAGNOSIS — E785 Hyperlipidemia, unspecified: Secondary | ICD-10-CM

## 2011-12-22 DIAGNOSIS — I10 Essential (primary) hypertension: Secondary | ICD-10-CM

## 2011-12-22 DIAGNOSIS — I25810 Atherosclerosis of coronary artery bypass graft(s) without angina pectoris: Secondary | ICD-10-CM

## 2011-12-22 MED ORDER — RAMIPRIL 5 MG PO CAPS: 5.0000 mg | ORAL_CAPSULE | Freq: Two times a day (BID) | ORAL | Status: DC

## 2011-12-22 MED ORDER — METOPROLOL SUCCINATE ER 25 MG PO TB24: 25.0000 mg | ORAL_TABLET | Freq: Two times a day (BID) | ORAL | Status: DC

## 2011-12-22 NOTE — Progress Notes (Signed)
HPI:  Doing well.  Discussed quite a few subjects.  Time for AAA surveillance.  No chest pain.  Tolerating meds.  We reviewed.    Current Outpatient Prescriptions  Medication Sig Dispense Refill  . aspirin 81 MG EC tablet Take 81 mg by mouth daily.        . celecoxib (CELEBREX) 200 MG capsule Take 2 capsules (400 mg total) by mouth daily.  180 capsule  3  . clonazePAM (KLONOPIN) 0.5 MG tablet 1 1/2 tab po qd       . Coenzyme Q10 (EQL COQ10) 300 MG CAPS Take by mouth. 1 cap daily       . Docusate Calcium (STOOL SOFTENER PO) Take by mouth. As needed       . dutasteride (AVODART) 0.5 MG capsule Take 0.5 mg by mouth daily.        . fenofibrate 160 MG tablet Take 1 tablet (160 mg total) by mouth daily.  90 tablet  3  . gabapentin (NEURONTIN) 100 MG capsule TAKE 1 CAPSULE THREE TIMES A DAY AS NEEDED HYPERSENSITIVITY RESOLVES  90 capsule  2  . HYDROcodone-acetaminophen (VICODIN ES) 7.5-750 MG per tablet Take 1 tablet by mouth every 6 (six) hours as needed.        Marland Kitchen LORazepam (ATIVAN) 0.5 MG tablet Take 0.5 mg by mouth 3 (three) times daily as needed.        . metoprolol succinate (TOPROL-XL) 25 MG 24 hr tablet Take 1 tablet (25 mg total) by mouth 2 (two) times daily.  180 tablet  3  . mirtazapine (REMERON) 30 MG tablet Take 30 mg by mouth at bedtime.        . nitroGLYCERIN (NITROSTAT) 0.4 MG SL tablet Place 1 tablet (0.4 mg total) under the tongue every 5 (five) minutes as needed. for chest pain  25 tablet  6  . OMEGA-3 KRILL OIL 300 MG CAPS Take by mouth. once a day       . pravastatin (PRAVACHOL) 80 MG tablet Take 1 tablet (80 mg total) by mouth at bedtime.  90 tablet  1  . ramipril (ALTACE) 5 MG capsule Take 1 capsule (5 mg total) by mouth 2 (two) times daily.  180 capsule  3  . tiZANidine (ZANAFLEX) 4 MG capsule Take 1 capsule (4 mg total) by mouth 2 (two) times daily as needed.  180 capsule  1  . traMADol (ULTRAM) 50 MG tablet Take 50 mg by mouth as needed. for pain       . Turmeric 500 MG  CAPS Take by mouth. once daily       . valACYclovir (VALTREX) 500 MG tablet TAKE 1 TABLET TWICE A DAY AS NEEDED  60 tablet  0   Current Facility-Administered Medications  Medication Dose Route Frequency Provider Last Rate Last Dose  . 0.9 %  sodium chloride infusion  500 mL Intravenous Continuous Yancey Flemings, MD        Allergies  Allergen Reactions  . Isosorbide Mononitrate     REACTION: migraine type headache with sustained release form  . Penicillins     REACTION: SENSITIVE TO TOUCH    Past Medical History  Diagnosis Date  . Adenomatous colon polyp   . CAD (coronary artery disease)   . Coronary atherosclerosis of artery bypass graft   . Other and unspecified hyperlipidemia   . Syncope and collapse   . Unspecified essential hypertension   . Abdominal aneurysm without mention of rupture   .  Diarrhea of presumed infectious origin   . Esophageal reflux   . Personal history of colonic polyps   . Fever, unspecified   . Routine general medical examination at a health care facility   . Anal fissure     Past Surgical History  Procedure Date  . Cervical laminectomy   . Hiatal hernia repair   . Heart bypass     x6  . Cardiac catheterization 06-05-10  . Colonoscopy   . Polypectomy     Family History  Problem Relation Age of Onset  . Alcohol abuse Mother   . Colon cancer Neg Hx     History   Social History  . Marital Status: Married    Spouse Name: N/A    Number of Children: 5  . Years of Education: N/A   Occupational History  . [employer]   Social History Main Topics  . Smoking status: Former Games developer  . Smokeless tobacco: Not on file  . Alcohol Use: Yes     wine daily  . Drug Use: No  . Sexually Active: Not on file   Other Topics Concern  . Not on file   Social History Narrative   Daily caffeine    ROS: Please see the HPI.  All other systems reviewed and negative.  PHYSICAL EXAM:  BP 146/88  Pulse 54  Ht 6\' 2"  (1.88 m)  Wt  84.369 kg (186 lb)  BMI 23.88 kg/m2  General: Well developed, well nourished, in no acute distress. Head:  Normocephalic and atraumatic. Neck: no JVD Lungs: Clear to auscultation and percussion. Heart: Normal S1 and S2.  No murmur, rubs or gallops.  Abdomen:  Normal bowel sounds; soft; non tender; no organomegaly Pulses: Pulses normal in all 4 extremities. Extremities: No clubbing or cyanosis. No edema. Neurologic: Alert and oriented x 3.  EKG:  SB with first degree av block.  ASSESSMENT AND PLAN:

## 2011-12-22 NOTE — Patient Instructions (Addendum)
Your physician has requested that you have an abdominal aorta duplex. During this test, an ultrasound is used to evaluate the aorta. Allow 30 minutes for this exam. Do not eat after midnight the day before and avoid carbonated beverages  Your physician recommends that you continue on your current medications as directed. Please refer to the Current Medication list given to you today.  Your physician wants you to follow-up in: 6 MONTHS.  You will receive a reminder letter in the mail two months in advance. If you don't receive a letter, please call our office to schedule the follow-up appointment.  Your physician recommends that you return for a FASTING lipid and liver profile in 6 MONTHS.

## 2011-12-24 ENCOUNTER — Encounter (HOSPITAL_BASED_OUTPATIENT_CLINIC_OR_DEPARTMENT_OTHER): Payer: Self-pay | Admitting: *Deleted

## 2011-12-24 ENCOUNTER — Emergency Department (HOSPITAL_BASED_OUTPATIENT_CLINIC_OR_DEPARTMENT_OTHER)
Admission: EM | Admit: 2011-12-24 | Discharge: 2011-12-24 | Disposition: A | Discharge status: Home / Self Care | Payer: Federal, State, Local not specified - PPO | Attending: Emergency Medicine | Admitting: Emergency Medicine

## 2011-12-24 ENCOUNTER — Ambulatory Visit: Payer: Federal, State, Local not specified - PPO | Admitting: Family Medicine

## 2011-12-24 DIAGNOSIS — Z79899 Other long term (current) drug therapy: Secondary | ICD-10-CM | POA: Insufficient documentation

## 2011-12-24 DIAGNOSIS — B9789 Other viral agents as the cause of diseases classified elsewhere: Secondary | ICD-10-CM | POA: Insufficient documentation

## 2011-12-24 DIAGNOSIS — R059 Cough, unspecified: Secondary | ICD-10-CM | POA: Insufficient documentation

## 2011-12-24 DIAGNOSIS — J3489 Other specified disorders of nose and nasal sinuses: Secondary | ICD-10-CM | POA: Insufficient documentation

## 2011-12-24 DIAGNOSIS — R05 Cough: Secondary | ICD-10-CM | POA: Insufficient documentation

## 2011-12-24 DIAGNOSIS — I10 Essential (primary) hypertension: Secondary | ICD-10-CM | POA: Insufficient documentation

## 2011-12-24 DIAGNOSIS — I2510 Atherosclerotic heart disease of native coronary artery without angina pectoris: Secondary | ICD-10-CM | POA: Insufficient documentation

## 2011-12-24 DIAGNOSIS — B349 Viral infection, unspecified: Secondary | ICD-10-CM

## 2011-12-24 MED ORDER — OSELTAMIVIR PHOSPHATE 75 MG PO CAPS: 75.0000 mg | ORAL_CAPSULE | Freq: Two times a day (BID) | ORAL | Status: AC

## 2011-12-24 MED ORDER — OSELTAMIVIR PHOSPHATE 75 MG PO CAPS: 75.0000 mg | ORAL_CAPSULE | Freq: Two times a day (BID) | ORAL | Status: DC

## 2011-12-24 NOTE — ED Provider Notes (Signed)
History     CSN: [id number]  Arrival date & time 12/24/11  4098   First MD Initiated Contact with Patient 12/24/11 [phone]      Chief Complaint  Patient presents with  . Cough  . Nasal Congestion    (Consider location/radiation/quality/duration/timing/severity/associated sxs/prior treatment) Patient is a 61 y.o. male presenting with URI. The history is provided by the patient.  URI The primary symptoms include cough. Primary symptoms do not include fever, sore throat, abdominal pain, nausea or vomiting. The current episode started 2 days ago. This is a new problem. The problem has been gradually worsening.  The cough began 2 days ago. The cough is productive.  The onset of the illness is associated with exposure to sick contacts. Symptoms associated with the illness include congestion and rhinorrhea. The illness is not associated with chills or sinus pressure. Risk factors for severe complications from URI include being elderly and chronic cardiac disease.    Past Medical History  Diagnosis Date  . Adenomatous colon polyp   . CAD (coronary artery disease)   . Coronary atherosclerosis of artery bypass graft   . Other and unspecified hyperlipidemia   . Syncope and collapse   . Unspecified essential hypertension   . Abdominal aneurysm without mention of rupture   . Diarrhea of presumed infectious origin   . Esophageal reflux   . Personal history of colonic polyps   . Fever, unspecified   . Routine general medical examination at a health care facility   . Anal fissure     Past Surgical History  Procedure Date  . Cervical laminectomy   . Hiatal hernia repair   . Heart bypass     x6  . Cardiac catheterization 06-05-10  . Colonoscopy   . Polypectomy     Family History  Problem Relation Age of Onset  . Alcohol abuse Mother   . Colon cancer Neg Hx     History  Substance Use Topics  . Smoking status: Former Games developer  . Smokeless tobacco: Not on file  . Alcohol Use: Yes     wine daily      Review of Systems  Constitutional: Negative for fever and chills.  HENT: Positive for congestion and rhinorrhea. Negative for sore throat and sinus pressure.   Respiratory: Positive for cough.   Gastrointestinal: Negative for nausea, vomiting and abdominal pain.  All other systems reviewed and are negative.    Allergies  Isosorbide mononitrate and Penicillins  Home Medications   Current Outpatient Rx  Name Route Sig Dispense Refill  . ASPIRIN 81 MG PO TBEC Oral Take 81 mg by mouth daily.      . CELECOXIB 200 MG PO CAPS Oral Take 2 capsules (400 mg total) by mouth daily. 180 capsule 3  . CLONAZEPAM 0.5 MG PO TABS  1 1/2 tab po qd     . COENZYME Q10 300 MG PO CAPS Oral Take by mouth. 1 cap daily     . STOOL SOFTENER PO Oral Take by mouth. As needed     . DUTASTERIDE 0.5 MG PO CAPS Oral Take 0.5 mg by mouth daily.      . FENOFIBRATE 160 MG PO TABS Oral Take 1 tablet (160 mg total) by mouth daily. 90 tablet 3  . GABAPENTIN 100 MG PO CAPS  TAKE 1 CAPSULE THREE TIMES A DAY AS NEEDED HYPERSENSITIVITY RESOLVES 90 capsule 2  . HYDROCODONE-ACETAMINOPHEN 7.5-750 MG PO TABS Oral Take 1 tablet by mouth every 6 (six) hours  as needed.      Marland Kitchen LORAZEPAM 0.5 MG PO TABS Oral Take 0.5 mg by mouth 3 (three) times daily as needed.      Marland Kitchen METOPROLOL SUCCINATE ER 25 MG PO TB24 Oral Take 1 tablet (25 mg total) by mouth 2 (two) times daily. 180 tablet 3  . MIRTAZAPINE 30 MG PO TABS Oral Take 30 mg by mouth at bedtime.      Marland Kitchen NITROGLYCERIN 0.4 MG SL SUBL Sublingual Place 1 tablet (0.4 mg total) under the tongue every 5 (five) minutes as needed. for chest pain 25 tablet 6  . OMEGA-3 KRILL OIL 300 MG PO CAPS Oral Take by mouth. once a day     . PRAVASTATIN SODIUM 80 MG PO TABS Oral Take 1 tablet (80 mg total) by mouth at bedtime. 90 tablet 1  . RAMIPRIL 5 MG PO CAPS Oral Take 1 capsule (5 mg total) by mouth 2 (two) times daily. 180 capsule 3  . TIZANIDINE HCL 4 MG PO CAPS Oral Take 1  capsule (4 mg total) by mouth 2 (two) times daily as needed. 180 capsule 1  . TRAMADOL HCL 50 MG PO TABS Oral Take 50 mg by mouth as needed. for pain     . TURMERIC 500 MG PO CAPS Oral Take by mouth. once daily     . VALACYCLOVIR HCL 500 MG PO TABS  TAKE 1 TABLET TWICE A DAY AS NEEDED 60 tablet 0    BP 137/91  Pulse 54  Temp(Src) 98.4 F (36.9 C) (Oral)  SpO2 99%  Physical Exam  Constitutional: He is oriented to person, place, and time. He appears well-developed and well-nourished. No distress.  HENT:  Head: Normocephalic and atraumatic.  Right Ear: Tympanic membrane, external ear and ear canal normal.  Left Ear: Tympanic membrane, external ear and ear canal normal.  Nose: Mucosal edema and rhinorrhea present.  Mouth/Throat: Oropharynx is clear and moist.  Eyes: Conjunctivae and EOM are normal. Pupils are equal, round, and reactive to light. Right eye exhibits no discharge.  Neck: Normal range of motion. Neck supple.  Cardiovascular: Normal rate, regular rhythm, normal heart sounds and intact distal pulses.   No murmur heard. Pulmonary/Chest: Tachypnea noted. No respiratory distress. He has no decreased breath sounds. He has no wheezes. He has no rhonchi. He has no rales.       Well-healed midline sternotomy scar  Musculoskeletal: Normal range of motion. He exhibits no edema and no tenderness.  Neurological: He is alert and oriented to person, place, and time.  Skin: Skin is warm and dry. No rash noted.  Psychiatric: He has a normal mood and affect.    ED Course  Procedures (including critical care time)  Labs Reviewed - No data to display No results found.   1. Viral syndrome       MDM   Pt with symptoms consistent with viral URI.  Well appearing here.  No signs of breathing difficulty  No signs of pharyngitis, otitis or abnormal abdominal findings.   Due to clear lungs at this time do not feel that patient needs a chest x-ray. Given her multiple medical problems and  early symptoms. Will start on Tamiflu if this is early influenza.         Gwyneth Sprout, MD 12/24/11 1431

## 2011-12-24 NOTE — ED Notes (Signed)
Cough congestion body aches onset yesterday  Productive cough with yellow/green sputum

## 2011-12-27 NOTE — Assessment & Plan Note (Signed)
Rechecked by me today.  He should check at home, and Dr. Laury Axon can make appropriate adjustments as needed.

## 2011-12-27 NOTE — Assessment & Plan Note (Signed)
Not quite at target, but overall improved.  Of note, we discussed fenofibrate.  He is tolerating, and his triglycerides are better.  Would repeat values in a few months, and make further decisions regarding recommendations at that time.

## 2011-12-27 NOTE — Assessment & Plan Note (Signed)
He appears to be stable at the present time.  No new symptoms.  He has retired, and this may have reduced his stress level, and subsequent angina.  Will continue medical therapy.

## 2011-12-27 NOTE — Assessment & Plan Note (Signed)
Will order a repeat duplex.  There was only mild dilatation of the aorta.  Monitor

## 2012-01-03 ENCOUNTER — Other Ambulatory Visit: Payer: Self-pay | Admitting: Family Medicine

## 2012-01-04 MED ORDER — CLONAZEPAM 0.5 MG PO TABS: ORAL_TABLET | ORAL | Status: DC

## 2012-01-04 NOTE — Telephone Encounter (Signed)
Noted request for Zanaflex 4mg  noted last OV 12-19-10 last refill 10-13-11 #180 with 1 refill, Klonopin 11-10-11 #270 no refills noted sig 3 tablets QHS, r

## 2012-01-04 NOTE — Telephone Encounter (Signed)
.  rx faxed to pharmacy, manually. klonpin to [facility] Noted MD sent the RX for vanaflex

## 2012-01-20 ENCOUNTER — Encounter (INDEPENDENT_AMBULATORY_CARE_PROVIDER_SITE_OTHER): Payer: Federal, State, Local not specified - PPO | Admitting: Cardiology

## 2012-01-20 DIAGNOSIS — I2510 Atherosclerotic heart disease of native coronary artery without angina pectoris: Secondary | ICD-10-CM

## 2012-01-20 DIAGNOSIS — I714 Abdominal aortic aneurysm, without rupture, unspecified: Secondary | ICD-10-CM

## 2012-03-16 ENCOUNTER — Other Ambulatory Visit: Payer: Self-pay

## 2012-03-16 MED ORDER — FENOFIBRATE 160 MG PO TABS: 160.0000 mg | ORAL_TABLET | Freq: Every day | ORAL | Status: DC

## 2012-03-16 MED ORDER — CELECOXIB 200 MG PO CAPS: 400.0000 mg | ORAL_CAPSULE | Freq: Every day | ORAL | Status: DC

## 2012-03-23 ENCOUNTER — Other Ambulatory Visit: Payer: Self-pay | Admitting: Family Medicine

## 2012-03-23 MED ORDER — CLONAZEPAM 0.5 MG PO TABS: ORAL_TABLET | ORAL | Status: DC

## 2012-03-23 NOTE — Telephone Encounter (Signed)
Ok to refill 

## 2012-03-23 NOTE — Telephone Encounter (Signed)
Last seen 07/17/11 and filled 01/04/12 # 20. Please advise    KP

## 2012-03-23 NOTE — Telephone Encounter (Signed)
Patient faxed refill request for  Clonazepam 0.5mg  tabs Qty 270 with 3-refills 3-tabs at bedtime  FYI last OV wt/LBGJ 8.3.12 Last ov wt/LBCD 1.8.13

## 2012-04-10 ENCOUNTER — Other Ambulatory Visit: Payer: Self-pay | Admitting: Family Medicine

## 2012-05-24 ENCOUNTER — Other Ambulatory Visit (INDEPENDENT_AMBULATORY_CARE_PROVIDER_SITE_OTHER): Payer: Federal, State, Local not specified - PPO

## 2012-05-24 ENCOUNTER — Other Ambulatory Visit: Payer: Self-pay | Admitting: Family Medicine

## 2012-05-24 DIAGNOSIS — I10 Essential (primary) hypertension: Secondary | ICD-10-CM

## 2012-05-24 LAB — BASIC METABOLIC PANEL
BUN: 10 mg/dL (ref 6–23)
CO2: 30 mEq/L (ref 19–32)
Calcium: 9.2 mg/dL (ref 8.4–10.5)
Chloride: 106 mEq/L (ref 96–112)
Creatinine, Ser: 1 mg/dL (ref 0.4–1.5)
GFR: 78.22 mL/min (ref >60.00)
Glucose, Bld: 85 mg/dL (ref 70–99)
Potassium: 4.3 mEq/L (ref 3.5–5.1)
Sodium: 142 mEq/L (ref 135–145)

## 2012-05-24 LAB — HEPATIC FUNCTION PANEL
ALT: 21 U/L (ref 0–53)
AST: 23 U/L (ref 0–37)
Albumin: 4.3 g/dL (ref 3.5–5.2)
Alkaline Phosphatase: 35 U/L — ABNORMAL LOW (ref 39–117)
Bilirubin, Direct: 0 mg/dL (ref 0.0–0.3)
Total Bilirubin: 0.4 mg/dL (ref 0.3–1.2)
Total Protein: 6.9 g/dL (ref 6.0–8.3)

## 2012-05-24 LAB — LIPID PANEL
Cholesterol: 162 mg/dL (ref 0–200)
HDL: 51.5 mg/dL (ref >39.00)
LDL Cholesterol: 92 mg/dL (ref 0–99)
Total CHOL/HDL Ratio: 3
Triglycerides: 95 mg/dL (ref 0.0–149.0)
VLDL: 19 mg/dL (ref 0.0–40.0)

## 2012-06-10 ENCOUNTER — Encounter: Payer: Self-pay | Admitting: Family Medicine

## 2012-06-10 ENCOUNTER — Ambulatory Visit (INDEPENDENT_AMBULATORY_CARE_PROVIDER_SITE_OTHER): Payer: Federal, State, Local not specified - PPO | Admitting: Family Medicine

## 2012-06-10 VITALS — BP 120/70 | HR 65 | Temp 98.0°F | Ht 74.0 in | Wt 187.8 lb

## 2012-06-10 DIAGNOSIS — I714 Abdominal aortic aneurysm, without rupture, unspecified: Secondary | ICD-10-CM

## 2012-06-10 DIAGNOSIS — Z0000 Encounter for general adult medical examination without abnormal findings: Secondary | ICD-10-CM

## 2012-06-10 DIAGNOSIS — E785 Hyperlipidemia, unspecified: Secondary | ICD-10-CM

## 2012-06-10 DIAGNOSIS — I25810 Atherosclerosis of coronary artery bypass graft(s) without angina pectoris: Secondary | ICD-10-CM

## 2012-06-10 DIAGNOSIS — Z23 Encounter for immunization: Secondary | ICD-10-CM

## 2012-06-10 DIAGNOSIS — I10 Essential (primary) hypertension: Secondary | ICD-10-CM

## 2012-06-10 NOTE — Assessment & Plan Note (Signed)
Check  Labs con't meds 

## 2012-06-10 NOTE — Patient Instructions (Signed)
Preventive Care for Adults, Male A healthy lifestyle and preventative care can promote health and wellness. Preventative health guidelines for men include the following key practices:  A routine yearly physical is a good way to check with your caregiver about your health and preventative screening. It is a chance to share any concerns and updates on your health, and to receive a thorough exam.   Visit your dentist for a routine exam and preventative care every 6 months. Brush your teeth twice a day and floss once a day. Good oral hygiene prevents tooth decay and gum disease.   The frequency of eye exams is based on your age, health, family medical history, use of contact lenses, and other factors. Follow your caregiver's recommendations for frequency of eye exams.   Eat a healthy diet. Foods like vegetables, fruits, whole grains, low-fat dairy products, and lean protein foods contain the nutrients you need without too many calories. Decrease your intake of foods high in solid fats, added sugars, and salt. Eat the right amount of calories for you.Get information about a proper diet from your caregiver, if necessary.   Regular physical exercise is one of the most important things you can do for your health. Most adults should get at least 150 minutes of moderate-intensity exercise (any activity that increases your heart rate and causes you to sweat) each week. In addition, most adults need muscle-strengthening exercises on 2 or more days a week.   Maintain a healthy weight. The body mass index (BMI) is a screening tool to identify possible weight problems. It provides an estimate of body fat based on height and weight. Your caregiver can help determine your BMI, and can help you achieve or maintain a healthy weight.For adults 20 years and older:   A BMI below 18.5 is considered underweight.   A BMI of 18.5 to 24.9 is normal.   A BMI of 25 to 29.9 is considered overweight.   A BMI of 30 and above  is considered obese.   Maintain normal blood lipids and cholesterol levels by exercising and minimizing your intake of saturated fat. Eat a balanced diet with plenty of fruit and vegetables. Blood tests for lipids and cholesterol should begin at age 20 and be repeated every 5 years. If your lipid or cholesterol levels are high, you are over 50, or you are a high risk for heart disease, you may need your cholesterol levels checked more frequently.Ongoing high lipid and cholesterol levels should be treated with medicines if diet and exercise are not effective.   If you smoke, find out from your caregiver how to quit. If you do not use tobacco, do not start.   If you choose to drink alcohol, do not exceed 2 drinks per day. One drink is considered to be 12 ounces (355 mL) of beer, 5 ounces (148 mL) of wine, or 1.5 ounces (44 mL) of liquor.   Avoid use of street drugs. Do not share needles with anyone. Ask for help if you need support or instructions about stopping the use of drugs.   High blood pressure causes heart disease and increases the risk of stroke. Your blood pressure should be checked at least every 1 to 2 years. Ongoing high blood pressure should be treated with medicines, if weight loss and exercise are not effective.   If you are 45 to 60 years old, ask your caregiver if you should take aspirin to prevent heart disease.   Diabetes screening involves taking a blood   sample to check your fasting blood sugar level. This should be done once every 3 years, after age 45, if you are within normal weight and without risk factors for diabetes. Testing should be considered at a younger age or be carried out more frequently if you are overweight and have at least 1 risk factor for diabetes.   Colorectal cancer can be detected and often prevented. Most routine colorectal cancer screening begins at the age of 50 and continues through age 75. However, your caregiver may recommend screening at an earlier  age if you have risk factors for colon cancer. On a yearly basis, your caregiver may provide home test kits to check for hidden blood in the stool. Use of a small camera at the end of a tube, to directly examine the colon (sigmoidoscopy or colonoscopy), can detect the earliest forms of colorectal cancer. Talk to your caregiver about this at age 50, when routine screening begins. Direct examination of the colon should be repeated every 5 to 10 years through age 75, unless early forms of pre-cancerous polyps or small growths are found.   Hepatitis C blood testing is recommended for all people born from 1945 through 1965 and any individual with known risks for hepatitis C.   Practice safe sex. Use condoms and avoid high-risk sexual practices to reduce the spread of sexually transmitted infections (STIs). STIs include gonorrhea, chlamydia, syphilis, trichomonas, herpes, HPV, and human immunodeficiency virus (HIV). Herpes, HIV, and HPV are viral illnesses that have no cure. They can result in disability, cancer, and death.   A one-time screening for abdominal aortic aneurysm (AAA) and surgical repair of large AAAs by sound wave imaging (ultrasonography) is recommended for ages 65 to 75 years who are current or former smokers.   Healthy men should no longer receive prostate-specific antigen (PSA) blood tests as part of routine cancer screening. Consult with your caregiver about prostate cancer screening.   Testicular cancer screening is not recommended for adult males who have no symptoms. Screening includes self-exam, caregiver exam, and other screening tests. Consult with your caregiver about any symptoms you have or any concerns you have about testicular cancer.   Use sunscreen with skin protection factor (SPF) of 30 or more. Apply sunscreen liberally and repeatedly throughout the day. You should seek shade when your shadow is shorter than you. Protect yourself by wearing long sleeves, pants, a  wide-brimmed hat, and sunglasses year round, whenever you are outdoors.   Once a month, do a whole body skin exam, using a mirror to look at the skin on your back. Notify your caregiver of new moles, moles that have irregular borders, moles that are larger than a pencil eraser, or moles that have changed in shape or color.   Stay current with required immunizations.   Influenza. You need a dose every fall (or winter). The composition of the flu vaccine changes each year, so being vaccinated once is not enough.   Pneumococcal polysaccharide. You need 1 to 2 doses if you smoke cigarettes or if you have certain chronic medical conditions. You need 1 dose at age 65 (or older) if you have never been vaccinated.   Tetanus, diphtheria, pertussis (Tdap, Td). Get 1 dose of Tdap vaccine if you are younger than age 65 years, are over 65 and have contact with an infant, are a healthcare worker, or simply want to be protected from whooping cough. After that, you need a Td booster dose every 10 years. Consult your caregiver if   you have not had at least 3 tetanus and diphtheria-containing shots sometime in your life or have a deep or dirty wound.   HPV. This vaccine is recommended for males 13 through 60 years of age. This vaccine may be given to men 22 through 60 years of age who have not completed the 3 dose series. It is recommended for men through age 26 who have sex with men or whose immune system is weakened because of HIV infection, other illness, or medications. The vaccine is given in 3 doses over 6 months.   Measles, mumps, rubella (MMR). You need at least 1 dose of MMR if you were born in 1957 or later. You may also need a 2nd dose.   Meningococcal. If you are age 19 to 21 years and a first-year college student living in a residence hall, or have one of several medical conditions, you need to get vaccinated against meningococcal disease. You may also need additional booster doses.   Zoster (shingles).  If you are age 60 years or older, you should get this vaccine.   Varicella (chickenpox). If you have never had chickenpox or you were vaccinated but received only 1 dose, talk to your caregiver to find out if you need this vaccine.   Hepatitis A. You need this vaccine if you have a specific risk factor for hepatitis A virus infection, or you simply wish to be protected from this disease. The vaccine is usually given as 2 doses, 6 to 18 months apart.   Hepatitis B. You need this vaccine if you have a specific risk factor for hepatitis B virus infection or you simply wish to be protected from this disease. The vaccine is given in 3 doses, usually over 6 months.  Preventative Service / Frequency Ages 19 to 39  Blood pressure check.** / Every 1 to 2 years.   Lipid and cholesterol check.** / Every 5 years beginning at age 20.   Hepatitis C blood test.** / For any individual with known risks for hepatitis C.   Skin self-exam. / Monthly.   Influenza immunization.** / Every year.   Pneumococcal polysaccharide immunization.** / 1 to 2 doses if you smoke cigarettes or if you have certain chronic medical conditions.   Tetanus, diphtheria, pertussis (Tdap,Td) immunization. / A one-time dose of Tdap vaccine. After that, you need a Td booster dose every 10 years.   HPV immunization. / 3 doses over 6 months, if 26 and younger.   Measles, mumps, rubella (MMR) immunization. / You need at least 1 dose of MMR if you were born in 1957 or later. You may also need a 2nd dose.   Meningococcal immunization. / 1 dose if you are age 19 to 21 years and a first-year college student living in a residence hall, or have one of several medical conditions, you need to get vaccinated against meningococcal disease. You may also need additional booster doses.   Varicella immunization.** / Consult your caregiver.   Hepatitis A immunization.** / Consult your caregiver. 2 doses, 6 to 18 months apart.   Hepatitis B  immunization.** / Consult your caregiver. 3 doses usually over 6 months.  Ages 40 to 64  Blood pressure check.** / Every 1 to 2 years.   Lipid and cholesterol check.** / Every 5 years beginning at age 20.   Fecal occult blood test (FOBT) of stool. / Every year beginning at age 50 and continuing until age 75. You may not have to do this test if   you get colonoscopy every 10 years.   Flexible sigmoidoscopy** or colonoscopy.** / Every 5 years for a flexible sigmoidoscopy or every 10 years for a colonoscopy beginning at age 50 and continuing until age 75.   Hepatitis C blood test.** / For all people born from 1945 through 1965 and any individual with known risks for hepatitis C.   Skin self-exam. / Monthly.   Influenza immunization.** / Every year.   Pneumococcal polysaccharide immunization.** / 1 to 2 doses if you smoke cigarettes or if you have certain chronic medical conditions.   Tetanus, diphtheria, pertussis (Tdap/Td) immunization.** / A one-time dose of Tdap vaccine. After that, you need a Td booster dose every 10 years.   Measles, mumps, rubella (MMR) immunization. / You need at least 1 dose of MMR if you were born in 1957 or later. You may also need a 2nd dose.   Varicella immunization.**/ Consult your caregiver.   Meningococcal immunization.** / Consult your caregiver.   Hepatitis A immunization.** / Consult your caregiver. 2 doses, 6 to 18 months apart.   Hepatitis B immunization.** / Consult your caregiver. 3 doses, usually over 6 months.  Ages 65 and over  Blood pressure check.** / Every 1 to 2 years.   Lipid and cholesterol check.**/ Every 5 years beginning at age 20.   Fecal occult blood test (FOBT) of stool. / Every year beginning at age 50 and continuing until age 75. You may not have to do this test if you get colonoscopy every 10 years.   Flexible sigmoidoscopy** or colonoscopy.** / Every 5 years for a flexible sigmoidoscopy or every 10 years for a colonoscopy  beginning at age 50 and continuing until age 75.   Hepatitis C blood test.** / For all people born from 1945 through 1965 and any individual with known risks for hepatitis C.   Abdominal aortic aneurysm (AAA) screening.** / A one-time screening for ages 65 to 75 years who are current or former smokers.   Skin self-exam. / Monthly.   Influenza immunization.** / Every year.   Pneumococcal polysaccharide immunization.** / 1 dose at age 65 (or older) if you have never been vaccinated.   Tetanus, diphtheria, pertussis (Tdap, Td) immunization. / A one-time dose of Tdap vaccine if you are over 65 and have contact with an infant, are a healthcare worker, or simply want to be protected from whooping cough. After that, you need a Td booster dose every 10 years.   Varicella immunization. ** / Consult your caregiver.   Meningococcal immunization.** / Consult your caregiver.   Hepatitis A immunization. ** / Consult your caregiver. 2 doses, 6 to 18 months apart.   Hepatitis B immunization.** / Check with your caregiver. 3 doses, usually over 6 months.  **Family history and personal history of risk and conditions may change your caregiver's recommendations. Document Released: 01/26/2002 Document Revised: 11/19/2011 Document Reviewed: 04/27/2011 [employer] Patient Information 2012 [employer]. 

## 2012-06-10 NOTE — Assessment & Plan Note (Signed)
Stable con't meds 

## 2012-06-10 NOTE — Progress Notes (Signed)
Subjective:    Patient ID: [name], male    DOB: [birth year], 61 y.o.   MRN: [id number]  HPI Pt here for cpe --labs already done.  No complaints.    Review of Systems Review of Systems  Constitutional: Negative for activity change, appetite change and fatigue.  HENT: Negative for hearing loss, congestion, tinnitus and ear discharge.  dentist q20m Eyes: Negative for visual disturbance (see optho q1y -- vision corrected to 20/20 with glasses).  Respiratory: Negative for cough, chest tightness and shortness of breath.   Cardiovascular: Negative for chest pain, palpitations and leg swelling.  Gastrointestinal: Negative for abdominal pain, diarrhea, constipation and abdominal distention.  Genitourinary: Negative for urgency, frequency, decreased urine volume and difficulty urinating.  Musculoskeletal: Negative for back pain, arthralgias and gait problem.  Skin: Negative for color change, pallor and rash.  Neurological: Negative for dizziness, light-headedness, numbness and headaches.  Hematological: Negative for adenopathy. Does not bruise/bleed easily.  Psychiatric/Behavioral: Negative for suicidal ideas, confusion, sleep disturbance, self-injury, dysphoric mood, decreased concentration and agitation.   Past Medical History  Diagnosis Date  . Adenomatous colon polyp   . CAD (coronary artery disease)   . Coronary atherosclerosis of artery bypass graft   . Other and unspecified hyperlipidemia   . Syncope and collapse   . Unspecified essential hypertension   . Abdominal aneurysm without mention of rupture   . Diarrhea of presumed infectious origin   . Esophageal reflux   . Personal history of colonic polyps   . Fever, unspecified   . Routine general medical examination at a health care facility   . Anal fissure    History   Social History  . Marital Status: Married    Spouse Name: N/A    Number of Children: 5  . Years of Education: N/A   Occupational History  . [employer] [place] [employer]   Social History Main Topics  . Smoking status: Former Games developer  . Smokeless tobacco: Not on file  . Alcohol Use: Yes     wine daily  . Drug Use: No  . Sexually Active: Not on file   Other Topics Concern  . Not on file   Social History Narrative   Daily caffeine   Family History  Problem Relation Age of Onset  . Alcohol abuse Mother   . COPD Mother   . Colon cancer Neg Hx   . Heart disease Paternal Uncle   . Alcohol abuse Maternal Grandfather   . Heart disease Paternal Uncle   . Heart disease Paternal Uncle   . Heart disease Paternal Uncle   . Heart disease Paternal Uncle   . Heart disease Paternal Uncle   . Heart disease Paternal Uncle   . Heart disease Paternal Uncle   . Heart disease Paternal Uncle   . Heart disease Paternal Uncle   . Heart disease Paternal Uncle    Current Outpatient Prescriptions on File Prior to Visit  Medication Sig Dispense Refill  . aspirin 81 MG EC tablet Take 81 mg by mouth daily.        . celecoxib (CELEBREX) 200 MG capsule Take 2 capsules (400 mg total) by mouth daily.  180 capsule  3  . clonazePAM (KLONOPIN) 0.5 MG tablet 3 tablets daily at bedtime  270 tablet  0  . Coenzyme Q10 (EQL COQ10) 300 MG CAPS Take by mouth. 1 cap daily       . Docusate Calcium (STOOL SOFTENER PO) Take by mouth. As needed       .  fenofibrate 160 MG tablet Take 1 tablet (160 mg total) by mouth daily.  90 tablet  3  . gabapentin (NEURONTIN) 100 MG capsule TAKE 1 CAPSULE THREE TIMES A DAY AS NEEDED HYPERSENSITIVITY RESOLVES  90 capsule  0  . HYDROcodone-acetaminophen (VICODIN ES) 7.5-750 MG per tablet Take 1 tablet by mouth every 6 (six) hours as needed.        Marland Kitchen LORazepam (ATIVAN) 0.5 MG tablet Take 0.5 mg by mouth 3 (three) times daily as needed.        . metoprolol succinate (TOPROL-XL) 25 MG 24 hr tablet Take 1 tablet (25 mg total) by mouth 2 (two) times daily.  180 tablet  3  . mirtazapine (REMERON) 30 MG tablet Take 30 mg by mouth at bedtime.         . nitroGLYCERIN (NITROSTAT) 0.4 MG SL tablet Place 1 tablet (0.4 mg total) under the tongue every 5 (five) minutes as needed. for chest pain  25 tablet  6  . OMEGA-3 KRILL OIL 300 MG CAPS Take by mouth. once a day       . pravastatin (PRAVACHOL) 80 MG tablet Take 1 tablet (80 mg total) by mouth at bedtime.  90 tablet  1  . ramipril (ALTACE) 5 MG capsule Take 1 capsule (5 mg total) by mouth 2 (two) times daily.  180 capsule  3  . tiZANidine (ZANAFLEX) 4 MG capsule TAKE 1 CAPSULE TWICE DAILY AS NEEDED  180 capsule  1  . traMADol (ULTRAM) 50 MG tablet Take 50 mg by mouth as needed. for pain       . Turmeric 500 MG CAPS Take by mouth. once daily       . valACYclovir (VALTREX) 500 MG tablet TAKE 1 TABLET TWICE A DAY AS NEEDED  60 tablet  0  . dutasteride (AVODART) 0.5 MG capsule Take 0.5 mg by mouth daily.         Current Facility-Administered Medications on File Prior to Visit  Medication Dose Route Frequency Provider Last Rate Last Dose  . 0.9 %  sodium chloride infusion  500 mL Intravenous Continuous Hilarie Fredrickson, MD            Objective:   Physical Exam  BP 120/70  Pulse 65  Temp 98 F (36.7 C) (Oral)  Ht 6\' 2"  (1.88 m)  Wt 187 lb 12.8 oz (85.186 kg)  BMI 24.11 kg/m2  SpO2 97% General appearance: alert, cooperative, appears stated age and no distress Head: Normocephalic, without obvious abnormality, atraumatic Eyes: conjunctivae/corneas clear. PERRL, EOM's intact. Fundi benign. Ears: normal TM's and external ear canals both ears Nose: Nares normal. Septum midline. Mucosa normal. No drainage or sinus tenderness. Throat: lips, mucosa, and tongue normal; teeth and gums normal Neck: no adenopathy, no carotid bruit, no JVD, supple, symmetrical, trachea midline and thyroid not enlarged, symmetric, no tenderness/mass/nodules Back: symmetric, no curvature. ROM normal. No CVA tenderness. Lungs: clear to auscultation bilaterally Chest wall: no tenderness Heart: regular rate and  rhythm, S1, S2 normal, no murmur, click, rub or gallop Abdomen: soft, non-tender; bowel sounds normal; no masses,  no organomegaly Male genitalia: urology Rectal: urology Extremities: extremities normal, atraumatic, no cyanosis or edema Pulses: 2+ and symmetric Skin: Skin color, texture, turgor normal. No rashes or lesions Lymph nodes: Cervical, supraclavicular, and axillary nodes normal. Neurologic: Alert and oriented X 3, normal strength and tone. Normal symmetric reflexes. Normal coordination and gait Psych-- no depression, anxiety     Assessment & Plan:  cpe --  ghm utd           Labs done and reviewed            See AVS

## 2012-06-10 NOTE — Assessment & Plan Note (Signed)
Per cardio 

## 2012-06-10 NOTE — Assessment & Plan Note (Signed)
Per cardiology 

## 2012-06-21 ENCOUNTER — Ambulatory Visit (INDEPENDENT_AMBULATORY_CARE_PROVIDER_SITE_OTHER): Payer: Federal, State, Local not specified - PPO | Admitting: Cardiology

## 2012-06-21 VITALS — BP 127/76 | HR 47 | Ht 74.0 in | Wt 190.0 lb

## 2012-06-21 DIAGNOSIS — I714 Abdominal aortic aneurysm, without rupture, unspecified: Secondary | ICD-10-CM

## 2012-06-21 DIAGNOSIS — I2510 Atherosclerotic heart disease of native coronary artery without angina pectoris: Secondary | ICD-10-CM

## 2012-06-21 DIAGNOSIS — I25810 Atherosclerosis of coronary artery bypass graft(s) without angina pectoris: Secondary | ICD-10-CM

## 2012-06-21 DIAGNOSIS — G4733 Obstructive sleep apnea (adult) (pediatric): Secondary | ICD-10-CM

## 2012-06-21 DIAGNOSIS — E785 Hyperlipidemia, unspecified: Secondary | ICD-10-CM

## 2012-06-21 MED ORDER — METOPROLOL SUCCINATE ER 25 MG PO TB24: 25.0000 mg | ORAL_TABLET | Freq: Every day | ORAL | Status: DC

## 2012-06-21 NOTE — Assessment & Plan Note (Signed)
Now on CPAP. 

## 2012-06-21 NOTE — Progress Notes (Signed)
HPI:  Patient is in for follow up. Overall he is been stable. He is currently using CPAP and has a fairly new diagnosis of sleep apnea. His wife noted, and is being followed by [facility] neurologic. He denies shortness of breath or chest pain. He is tolerating his medicines relatively well.  Current Outpatient Prescriptions  Medication Sig Dispense Refill  . aspirin 81 MG EC tablet Take 81 mg by mouth daily.        . celecoxib (CELEBREX) 200 MG capsule Take 2 capsules (400 mg total) by mouth daily.  180 capsule  3  . clonazePAM (KLONOPIN) 0.5 MG tablet 3 tablets daily at bedtime  270 tablet  0  . Coenzyme Q10 (EQL COQ10) 300 MG CAPS Take by mouth. 1 cap daily       . Docusate Calcium (STOOL SOFTENER PO) Take by mouth. As needed       . famotidine (PEPCID) 20 MG tablet Take 20 mg by mouth daily.      . fenofibrate 160 MG tablet Take 1 tablet (160 mg total) by mouth daily.  90 tablet  3  . finasteride (PROSCAR) 5 MG tablet Take 5 mg by mouth daily.      Marland Kitchen gabapentin (NEURONTIN) 100 MG capsule TAKE 1 CAPSULE THREE TIMES A DAY AS NEEDED HYPERSENSITIVITY RESOLVES  90 capsule  0  . HYDROcodone-acetaminophen (VICODIN ES) 7.5-750 MG per tablet Take 1 tablet by mouth every 6 (six) hours as needed.        Marland Kitchen LORazepam (ATIVAN) 0.5 MG tablet Take 0.5 mg by mouth 3 (three) times daily as needed.        . metoprolol succinate (TOPROL-XL) 25 MG 24 hr tablet Take 1 tablet (25 mg total) by mouth 2 (two) times daily.  180 tablet  3  . mirtazapine (REMERON) 30 MG tablet Take 30 mg by mouth at bedtime.        . nitroGLYCERIN (NITROSTAT) 0.4 MG SL tablet Place 1 tablet (0.4 mg total) under the tongue every 5 (five) minutes as needed. for chest pain  25 tablet  6  . OMEGA-3 KRILL OIL 300 MG CAPS Take by mouth. once a day       . pravastatin (PRAVACHOL) 80 MG tablet Take 1 tablet (80 mg total) by mouth at bedtime.  90 tablet  1  . ramipril (ALTACE) 5 MG capsule Take 1 capsule (5 mg total) by mouth 2 (two) times  daily.  180 capsule  3  . tiZANidine (ZANAFLEX) 4 MG capsule TAKE 1 CAPSULE TWICE DAILY AS NEEDED  180 capsule  1  . traMADol (ULTRAM) 50 MG tablet Take 50 mg by mouth as needed. for pain       . Turmeric 500 MG CAPS Take by mouth. once daily       . valACYclovir (VALTREX) 500 MG tablet TAKE 1 TABLET TWICE A DAY AS NEEDED  60 tablet  0   Current Facility-Administered Medications  Medication Dose Route Frequency Provider Last Rate Last Dose  . 0.9 %  sodium chloride infusion  500 mL Intravenous Continuous Hilarie Fredrickson, MD        Allergies  Allergen Reactions  . Celexa (Citalopram Hydrobromide)     Suffer diaherra  . Doxycycline     Upset stomach  . Isosorbide Mononitrate     REACTION: migraine type headache with sustained release form  . Penicillins     REACTION: SENSITIVE TO TOUCH  . Xifaxan (Rifaximin)  Very strong antibotic (* Dr Marina Goodell) made pt very sick    Past Medical History  Diagnosis Date  . Adenomatous colon polyp   . CAD (coronary artery disease)   . Coronary atherosclerosis of artery bypass graft   . Other and unspecified hyperlipidemia   . Syncope and collapse   . Unspecified essential hypertension   . Abdominal aneurysm without mention of rupture   . Diarrhea of presumed infectious origin   . Esophageal reflux   . Personal history of colonic polyps   . Fever, unspecified   . Routine general medical examination at a health care facility   . Anal fissure     Past Surgical History  Procedure Date  . Cervical laminectomy   . Hiatal hernia repair   . Heart bypass     x6  . Cardiac catheterization 06-05-10  . Colonoscopy   . Polypectomy     Family History  Problem Relation Age of Onset  . Alcohol abuse Mother   . COPD Mother   . Colon cancer Neg Hx   . Heart disease Paternal Uncle   . Alcohol abuse Maternal Grandfather   . Heart disease Paternal Uncle   . Heart disease Paternal Uncle   . Heart disease Paternal Uncle   . Heart disease Paternal  Uncle   . Heart disease Paternal Uncle   . Heart disease Paternal Uncle   . Heart disease Paternal Uncle   . Heart disease Paternal Uncle   . Heart disease Paternal Uncle   . Heart disease Paternal Uncle     History   Social History  . Marital Status: Married    Spouse Name: N/A    Number of Children: 5  . Years of Education: N/A   Occupational History  . [employer]LECT[employer]IC TE[employer]   Social History Main Topics  . Smoking status: Former Games developer  . Smokeless tobacco: Not on file  . Alcohol Use: Yes     wine daily  . Drug Use: No  . Sexually Active: Not on file   Other Topics Concern  . Not on file   Social History Narrative   Daily caffeine    ROS: Please see the HPI.  All other systems reviewed and negative.  PHYSICAL EXAM:  BP 127/76  Pulse 47  Ht 6\' 2"  (1.88 m)  Wt 190 lb (86.183 kg)  BMI 24.39 kg/m2  General: Well developed, well nourished, in no acute distress. Head:  Normocephalic and atraumatic. Neck: no JVD Lungs: Clear to auscultation and percussion. Chest:  MS stable.   Heart: Normal S1 and S2.  No murmur, rubs or gallops.  Abdomen:  Normal bowel sounds; soft; non tender; no organomegaly.  I do not feel AAA.   Pulses: Pulses normal in all 4 extremities. Extremities: No clubbing or cyanosis. No edema. Neurologic: Alert and oriented x 3.  EKG:  Marked SB with 1 degree av block.    ASSESSMENT AND PLAN:

## 2012-06-21 NOTE — Assessment & Plan Note (Signed)
Managed by primary care.  Just above target.

## 2012-06-21 NOTE — Patient Instructions (Addendum)
Your physician has recommended you make the following change in your medication: DECREASE Metoprolol Succinate to once a day  Your physician wants you to follow-up in: 6 MONTHS with Dr [name].  You will receive a reminder letter in the mail two months in advance. If you don't receive a letter, please call our office to schedule the follow-up appointment.

## 2012-06-21 NOTE — Assessment & Plan Note (Signed)
Slightly larger.  Due for repeat 2/14.  Will see him at that time.

## 2012-06-21 NOTE — Assessment & Plan Note (Signed)
No current angina.  Continue meds.

## 2012-06-25 ENCOUNTER — Other Ambulatory Visit: Payer: Self-pay | Admitting: Family Medicine

## 2012-06-29 ENCOUNTER — Telehealth: Payer: Self-pay | Admitting: *Deleted

## 2012-06-29 NOTE — Telephone Encounter (Signed)
Received incoming call from [facility] with Sander Radon Nurse where pt was referred by MD Lown requesting copy of labs noted 05-24-12, advised creatnine level 1.1 and faxed to office [phone]

## 2012-07-11 ENCOUNTER — Encounter: Payer: Self-pay | Admitting: Cardiology

## 2012-07-11 NOTE — Telephone Encounter (Signed)
This encounter was created in error - please disregard.

## 2012-07-11 NOTE — Telephone Encounter (Signed)
New msg [facility] wants to clarify directions for metoprolol 25 mg once a day or twice a day. Please call back

## 2012-07-13 ENCOUNTER — Encounter: Payer: Self-pay | Admitting: Family Medicine

## 2012-07-14 ENCOUNTER — Ambulatory Visit (INDEPENDENT_AMBULATORY_CARE_PROVIDER_SITE_OTHER): Payer: Federal, State, Local not specified - PPO | Admitting: Psychology

## 2012-07-14 ENCOUNTER — Encounter: Payer: Self-pay | Admitting: Family Medicine

## 2012-07-14 DIAGNOSIS — F4322 Adjustment disorder with anxiety: Secondary | ICD-10-CM

## 2012-07-14 MED ORDER — PRAVASTATIN SODIUM 80 MG PO TABS: 80.0000 mg | ORAL_TABLET | Freq: Every day | ORAL | Status: DC

## 2012-07-22 ENCOUNTER — Telehealth: Payer: Self-pay | Admitting: *Deleted

## 2012-07-22 NOTE — Telephone Encounter (Signed)
Prior Auth approved 05-20-12 until 07-20-13. Approval letter scan to chart.

## 2012-07-28 ENCOUNTER — Other Ambulatory Visit: Payer: Self-pay | Admitting: Neurosurgery

## 2012-07-29 ENCOUNTER — Ambulatory Visit (INDEPENDENT_AMBULATORY_CARE_PROVIDER_SITE_OTHER): Payer: Federal, State, Local not specified - PPO | Admitting: Psychology

## 2012-07-29 DIAGNOSIS — F4322 Adjustment disorder with anxiety: Secondary | ICD-10-CM

## 2012-08-02 ENCOUNTER — Ambulatory Visit (INDEPENDENT_AMBULATORY_CARE_PROVIDER_SITE_OTHER): Payer: Federal, State, Local not specified - PPO | Admitting: Psychology

## 2012-08-02 DIAGNOSIS — F4322 Adjustment disorder with anxiety: Secondary | ICD-10-CM

## 2012-08-10 ENCOUNTER — Encounter (HOSPITAL_COMMUNITY): Payer: Self-pay | Admitting: Pharmacy Technician

## 2012-08-10 ENCOUNTER — Ambulatory Visit: Payer: Federal, State, Local not specified - PPO | Admitting: Psychology

## 2012-08-17 ENCOUNTER — Encounter (HOSPITAL_COMMUNITY)
Admission: RE | Admit: 2012-08-17 | Discharge: 2012-08-17 | Disposition: A | Discharge status: Home / Self Care | Payer: Federal, State, Local not specified - PPO | Source: Ambulatory Visit | Attending: Neurosurgery | Admitting: Neurosurgery

## 2012-08-17 ENCOUNTER — Ambulatory Visit (HOSPITAL_COMMUNITY)
Admission: RE | Admit: 2012-08-17 | Discharge: 2012-08-17 | Disposition: A | Discharge status: Home / Self Care | Payer: Federal, State, Local not specified - PPO | Source: Ambulatory Visit | Attending: Neurosurgery | Admitting: Neurosurgery

## 2012-08-17 ENCOUNTER — Encounter (HOSPITAL_COMMUNITY): Payer: Self-pay

## 2012-08-17 DIAGNOSIS — Z951 Presence of aortocoronary bypass graft: Secondary | ICD-10-CM | POA: Insufficient documentation

## 2012-08-17 DIAGNOSIS — Z01818 Encounter for other preprocedural examination: Secondary | ICD-10-CM | POA: Insufficient documentation

## 2012-08-17 DIAGNOSIS — Z01812 Encounter for preprocedural laboratory examination: Secondary | ICD-10-CM | POA: Insufficient documentation

## 2012-08-17 HISTORY — DX: Sleep apnea, unspecified: G47.30

## 2012-08-17 HISTORY — DX: Insomnia, unspecified: G47.00

## 2012-08-17 HISTORY — DX: Acute myocardial infarction, unspecified: I21.9

## 2012-08-17 HISTORY — DX: Headache: R51

## 2012-08-17 HISTORY — DX: Unspecified osteoarthritis, unspecified site: M19.90

## 2012-08-17 LAB — CBC
HCT: 37.6 % — ABNORMAL LOW (ref 39.0–52.0)
Hemoglobin: 13.2 g/dL (ref 13.0–17.0)
MCH: 31.7 pg (ref 26.0–34.0)
MCHC: 35.1 g/dL (ref 30.0–36.0)
MCV: 90.4 fL (ref 78.0–100.0)
Platelets: 174 10*3/uL (ref 150–400)
RBC: 4.16 MIL/uL — ABNORMAL LOW (ref 4.22–5.81)
RDW: 12.4 % (ref 11.5–15.5)
WBC: 3.9 10*3/uL — ABNORMAL LOW (ref 4.0–10.5)

## 2012-08-17 LAB — BASIC METABOLIC PANEL
BUN: 13 mg/dL (ref 6–23)
CO2: 27 mEq/L (ref 19–32)
Calcium: 9.9 mg/dL (ref 8.4–10.5)
Chloride: 103 mEq/L (ref 96–112)
Creatinine, Ser: 1.16 mg/dL (ref 0.50–1.35)
GFR calc Af Amer: 77 mL/min — ABNORMAL LOW (ref >90)
GFR calc non Af Amer: 67 mL/min — ABNORMAL LOW (ref >90)
Glucose, Bld: 83 mg/dL (ref 70–99)
Potassium: 4.1 mEq/L (ref 3.5–5.1)
Sodium: 138 mEq/L (ref 135–145)

## 2012-08-17 LAB — SURGICAL PCR SCREEN
MRSA, PCR: NEGATIVE
Staphylococcus aureus: POSITIVE — AB

## 2012-08-17 LAB — TYPE AND SCREEN
ABO/RH(D): O POS
Antibody Screen: NEGATIVE

## 2012-08-17 NOTE — Pre-Procedure Instructions (Signed)
20 [name]  08/17/2012   Your procedure is scheduled on:  08-23-2012  Report to [facility] at 5:30 AM.  Call this number if you have problems the morning of surgery: [phone]   Remember:   Do not eat food or drink:After Midnight.    Take these medicines the morning of surgery with A SIP OF WATER: Pepcid(Famotidine),Finasteride(Proscar),pain medication as needed,metoprolol(Toprol XL),   Do not wear jewelry  Do not wear lotions, powders, or perfumes. You may wear deodorant.  Do not shave 48 hours prior to surgery. Men may shave face and neck.  Do not bring valuables to the hospital.  Contacts, dentures or bridgework may not be worn into surgery.  Leave suitcase in the car. After surgery it may be brought to your room.  For patients admitted to the hospital, checkout time is 11:00 AM the day of discharge.      Special Instructions: CHG Shower Use Special Wash: 1/2 bottle night before surgery and 1/2 bottle morning of surgery.      Please read over the following fact sheets that you were given: Pain Booklet, Coughing and Deep Breathing, MRSA Information and Surgical Site Infection Prevention

## 2012-08-17 NOTE — Progress Notes (Signed)
Pt. wishes to use his own CPAP after surgery and will bring with him the day of surgery.

## 2012-08-17 NOTE — Progress Notes (Signed)
Pt. States he is sure he has had stress test and possible cath within last 3 years. Nothing in Epic,requested from [facility].

## 2012-08-18 NOTE — Anesthesia Preprocedure Evaluation (Addendum)
Anesthesia Evaluation  Patient identified by MRN, date of birth, ID band Patient awake    Reviewed: Allergy & Precautions, H&P , NPO status , Patient's Chart, lab work & pertinent test results  Airway Mallampati: II  Neck ROM: full    Dental   Pulmonary sleep apnea ,          Cardiovascular hypertension, + CAD, + Past MI and + CABG  Cath  05/2010 ef = 45%, no change in CAD from 2007   Neuro/Psych  Headaches, Anxiety    GI/Hepatic GERD-  ,  Endo/Other    Renal/GU      Musculoskeletal   Abdominal   Peds  Hematology   Anesthesia Other Findings   Reproductive/Obstetrics                          Anesthesia Physical Anesthesia Plan  ASA: III  Anesthesia Plan: General   Post-op Pain Management:    Induction: Intravenous  Airway Management Planned: Oral ETT  Additional Equipment:   Intra-op Plan:   Post-operative Plan: Extubation in OR  Informed Consent: I have reviewed the patients History and Physical, chart, labs and discussed the procedure including the risks, benefits and alternatives for the proposed anesthesia with the patient or authorized representative who has indicated his/her understanding and acceptance.     Plan Discussed with: CRNA and Surgeon  Anesthesia Plan Comments:         Anesthesia Quick Evaluation

## 2012-08-18 NOTE — Consult Note (Signed)
Anesthesia Chart Review:  Patient is a 61 year old male scheduled for L3-4, L4-5 anterolateral decompression/fusion on 08/23/12 by Dr. Venetia Maxon.  History includes former smoker, CAD s/p CABG  '02, HTN, HLD, anxiety, headaches, GERD, OSA with CPAP use, AAA ( 2.7 X 3.7 cm by U/S on 01/20/12).  PCP is Dr. Loreen Freud.  His Cardiologist is Dr. [name].  He was last seen on 06/21/12 and by notes appeared stable from a CV standpoint. EKG then showed SB @ 47 bpm with first degree AVB.  He is not scheduled for follow-up again until February 2014.   Nuclear stress test on 02/16/11 (done for complaints of angina) showed: Overall Impression Comments: Inferior defect consistent with soft tissue attenuation and very mild ischemia. Does not appear to be significant quantitatively. LVEF 67%. Following his stress test his chest pain improved, so Dr. [name] ultimately decided to treat him medically.   Cardiac cath on 06/06/10 showed: 1. Severe three-vessel coronary artery disease which is unchanged.  2. 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  3. The saphenous vein sequential graft to the first obtuse marginal artery and second obtuse marginal artery has some mild plaquing in it. The first limb is chronically occluded. There is a 50% lesion in the proximal part of the graft to moderate diffuse disease throughout the middle of the graft.  4. Saphenous vein Y-graft to the right coronary artery and posterior descending artery is patent. The posterior descending artery is tiny and has dye hang-up in the graft which is chronic. Saphenous vein graft to the ramus is patent.  5. Left ventricular functions with an ejection fraction of 45-50%.  Continued medical therapy was recommended.  Echo on 05/10/08 showed: - Overall left ventricular systolic function was normal. Left ventricular ejection fraction was estimated , range being 60 % to 65 %. There was focal basal septal hypertrophy that measures 24 mm then quickly  tapers. Trivial MR/PR.  CXR on 08/17/12 showed no active disease.  Evidence of CABG.  Labs noted.  Reviewed above with Anesthesiologist Dr. Krista Blue.  If no new/acute CV symptoms, then anticipate patient can proceed as planned.  Shonna Chock, PA-C

## 2012-08-22 MED ORDER — VANCOMYCIN HCL IN DEXTROSE 1-5 GM/200ML-% IV SOLN
1000.0000 mg | INTRAVENOUS | Status: AC
  Administered 2012-08-23: 1000 mg via INTRAVENOUS

## 2012-08-22 NOTE — H&P (Signed)
Barbera Setters. Lust  #[id number]  DOB:  February 29, 1951  07/27/2012:  Betsey Holiday comes in today to review his MRI of his lumbar spine which shows some mild changes at the L2-3 level, but they do not appear to be severe.  He does have severe degenerative changes at L3-4 and L4-5 levels as per our previous discussion.  Based on these findings and his pain complaints, I have recommended that he proceed with anterolateral decompression and fusion with percutaneous pedicle screw fixation L3 through L5 levels.  He would be fitted for an LSO brace.  He wishes to go ahead with surgery in the first week of September.  The risks and benefits of the surgery were explained in detail with the patient who wishes to proceed and we went over models.  I gave him literature on the surgery and answered his and his wife's questions.  He has exhausted nonsurgical conservation therapy including physical therapy and anti-inflammatory medications for greater than 6 weeks of therapy without sustained relief.  At this point, I believe that surgical intervention is appropriate, warranted and is really his only option other than living with the pain which he says he really cannot do.          Danae Orleans. Venetia Maxon, M.D./sv Barbera Setters. [place]   DOB: 10-29-1951 #[id number]  06/29/2012:   Correy Bourcier is a previous patient of Dr. Jordan Likes and he has performed four to five neck surgeries on him, first in 03/2006 at C3-4 and C4-5, ACDF at C6-7 in 01/2000, and ACDF at C5-6 in 06/1997.  He has had lumbar disc surgery by Dr. Darrelyn Hillock which he said worked well until he was hit by a tractor and then has had significant problems after that.  He had a physical job at the [employer] and had two neck fractures.  He retired from the [employer].  He was hoping that decreased activity would allow his symptoms to diminish, but he said this has not improved, and he notes no improvement with retirement.  He complains of pain in his low back, intermittently into both of his legs.  He has been working  with Dr. Murray Hodgkins who has done RF blocks, facet blocks, and they only helped him with his neck.  He did not get any relief with the lumbar interventions.  Dr. Laury Axon is his primary physician who recommended he come see me.  He has been taking Hydrocodone for pain.  He describes that he feels as if he has been stung by yellow jackets at the base of his spine.  He complains that his left leg bothers him more than his right and goes down to both knees.  He has difficulty going up and down steps.    Medications and Allergies:   He is ALLERGIC TO PENICILLIN, DOXYCYCLINE, ISOSORBIDE, CELEXA, AND XIFAXAN. Medications - Altace 5 mg. b.i.d., Aspirin 81 mg. q.h.s., Celebrex 200 mg. b.i.d., CoQ10 100 mg. qd, Fenofibrate 160 mg. q.h.s., Finasteride 5 mg. qd, Gabapentin 100 mg. t.i.d., Hydrocodone 500 mg. prn, Klonopin .5 mg. 1.5 mg. q.h.s., Krill Oil 300 mg. qd, Nitrostat .4 mg. prn, Pepcid 20 mg. qd, Pravastatin 80 mg. q.h.s., Remeron 30 mg. q.h.s., Toprol 25 mg. qd, Tramadol 50 mg. prn, Tumeric 500 mg. qd, Valtrex 500 mg. prn, and Zanaflex 4 mg. prn.    PHYSICAL EXAMINATION:  On examination today, he has decreased ability to bend and is only able to bend to the level of his knees with his upper extremities outstretched. He is  able to stand on his heels and toes.  He walks with a shuffling gait.  He has decreased ability to squat in both legs, left greater than right.  He has bilateral sciatic notch discomfort.  He has a positive seated straight leg raise bilaterally.   REVIEW OF SYSTEMS:   A detailed Review of Systems sheet was reviewed with the patient.  Pertinent positives include under eyes - he wears reading glasses, under ears, nose, mouth and throat - he notes ringing in his ears, under cardiovascular - he notes high blood pressure and high cholesterol, under musculoskeletal - he notes leg weakness, back pain, and neck pain.  All other systems are negative; this includes Constitutional symptoms,  Cardiovascular,  Endocrine, Respiratory, Gastrointestinal, Genitourinary, Musculoskeletal, Integumentary & Breast, Neurologic, Psychiatric, Hematologic/Lymphatic, Allergic/Immunologic.    FAMILY HISTORY:    Both parents are deceased, cause unspecified.    NEUROLOGICAL EXAMINATION: The patient is oriented to time, person and place and has good recall of both recent and remote memory with normal attention span and concentration.  The patient speaks with clear and fluent speech and exhibits normal language function and appropriate fund of knowledge.    Cranial Nerve Examination - pupils are equal, round and reactive to light.  Extraocular movements are full.  Visual fields are full to confrontational testing.  Facial sensation and facial movement are symmetric and intact.  Hearing is intact to finger rub.  Palate is upgoing.  Shoulder shrug is symmetric.  Tongue protrudes in the midline.      Motor Examination - motor strength is 5/5 in the bilateral deltoids, biceps, triceps, handgrips, wrist extensors, interosseous.  In the lower extremities motor strength is 5/5 in hip flexion, extension, quadriceps, hamstrings, plantar flexion, dorsiflexion and extensor hallucis longus.      Sensory Examination - normal to light touch and pinprick sensation in the upper and lower extremities.     Deep Tendon Reflexes - 2 in the biceps, triceps, and brachioradialis, 2 in the knees, 2 in the ankles.  The great toes are downgoing to plantar stimulation.      Cerebellar Examination - normal coordination in upper and lower extremities and normal rapid alternating movements.  Romberg test is negative.    IMPRESSION AND RECOMMENDATIONS: I reviewed plain radiographs and also his previous MRI with the patient.  I have recommended that he get a new MRI of his lumbar spine.  He does appear to have significant disc degeneration with subchondral cyst formation and disc height collapse, with central disc protrusions causing stenosis and marked  neural foraminal stenosis particularly at the L4-5 level, but also because there is slightly lesser degree at the L3-4 level.  I believe this is the basis for his pain complaints.  I have recommended we get a new MRI and I will make further recommendations after that study has been done.  I do think that he may benefit from an anterolateral decompression with percutaneous pedicle screws with fixation at L3 through L5 levels, but I would like to assess the other images and I can make further recommendations after the new MRI has been performed.           Danae Orleans. Venetia Maxon, M.D./aft

## 2012-08-23 ENCOUNTER — Encounter (HOSPITAL_COMMUNITY)
Admission: RE | Disposition: A | Discharge status: Home / Self Care | Payer: Self-pay | Source: Ambulatory Visit | Attending: Neurosurgery

## 2012-08-23 ENCOUNTER — Inpatient Hospital Stay (HOSPITAL_COMMUNITY)
Admission: RE | Admit: 2012-08-23 | Discharge: 2012-08-26 | DRG: 756 | Disposition: A | Discharge status: Home / Self Care | Payer: Federal, State, Local not specified - PPO | Source: Ambulatory Visit | Attending: Neurosurgery | Admitting: Neurosurgery

## 2012-08-23 ENCOUNTER — Encounter (HOSPITAL_COMMUNITY): Payer: Self-pay

## 2012-08-23 ENCOUNTER — Encounter (HOSPITAL_COMMUNITY): Payer: Self-pay | Admitting: Anesthesiology

## 2012-08-23 ENCOUNTER — Ambulatory Visit (HOSPITAL_COMMUNITY): Payer: Federal, State, Local not specified - PPO

## 2012-08-23 ENCOUNTER — Ambulatory Visit (HOSPITAL_COMMUNITY): Payer: Federal, State, Local not specified - PPO | Admitting: Anesthesiology

## 2012-08-23 DIAGNOSIS — Z86010 Personal history of colonic polyps: Secondary | ICD-10-CM

## 2012-08-23 DIAGNOSIS — E7800 Pure hypercholesterolemia, unspecified: Secondary | ICD-10-CM | POA: Diagnosis present

## 2012-08-23 DIAGNOSIS — G4730 Sleep apnea, unspecified: Secondary | ICD-10-CM | POA: Diagnosis present

## 2012-08-23 DIAGNOSIS — I714 Abdominal aortic aneurysm, without rupture, unspecified: Secondary | ICD-10-CM

## 2012-08-23 DIAGNOSIS — F411 Generalized anxiety disorder: Secondary | ICD-10-CM | POA: Diagnosis present

## 2012-08-23 DIAGNOSIS — I25810 Atherosclerosis of coronary artery bypass graft(s) without angina pectoris: Secondary | ICD-10-CM

## 2012-08-23 DIAGNOSIS — Z1211 Encounter for screening for malignant neoplasm of colon: Secondary | ICD-10-CM

## 2012-08-23 DIAGNOSIS — Z951 Presence of aortocoronary bypass graft: Secondary | ICD-10-CM

## 2012-08-23 DIAGNOSIS — H9319 Tinnitus, unspecified ear: Secondary | ICD-10-CM | POA: Diagnosis present

## 2012-08-23 DIAGNOSIS — I252 Old myocardial infarction: Secondary | ICD-10-CM

## 2012-08-23 DIAGNOSIS — K219 Gastro-esophageal reflux disease without esophagitis: Secondary | ICD-10-CM | POA: Diagnosis present

## 2012-08-23 DIAGNOSIS — I2510 Atherosclerotic heart disease of native coronary artery without angina pectoris: Secondary | ICD-10-CM | POA: Diagnosis present

## 2012-08-23 DIAGNOSIS — I10 Essential (primary) hypertension: Secondary | ICD-10-CM | POA: Diagnosis present

## 2012-08-23 DIAGNOSIS — M47817 Spondylosis without myelopathy or radiculopathy, lumbosacral region: Principal | ICD-10-CM | POA: Diagnosis present

## 2012-08-23 HISTORY — PX: ANTERIOR LAT LUMBAR FUSION: SHX1168

## 2012-08-23 SURGERY — ANTERIOR LATERAL LUMBAR FUSION 2 LEVELS
Anesthesia: General | Site: Back | Wound class: Clean

## 2012-08-23 MED ORDER — LACTATED RINGERS IV SOLN
INTRAVENOUS | Status: DC | PRN
  Administered 2012-08-23 (×4): via INTRAVENOUS

## 2012-08-23 MED ORDER — MORPHINE SULFATE (PF) 1 MG/ML IV SOLN
INTRAVENOUS | Status: AC
  Administered 2012-08-23: 18:00:00
  Filled 2012-08-23: qty 25

## 2012-08-23 MED ORDER — NALOXONE HCL 0.4 MG/ML IJ SOLN: 0.4000 mg | INTRAMUSCULAR | Status: DC | PRN

## 2012-08-23 MED ORDER — VANCOMYCIN HCL IN DEXTROSE 1-5 GM/200ML-% IV SOLN
1000.0000 mg | Freq: Two times a day (BID) | INTRAVENOUS | Status: AC
  Administered 2012-08-23 – 2012-08-24 (×2): 1000 mg via INTRAVENOUS
  Filled 2012-08-23 (×2): qty 200

## 2012-08-23 MED ORDER — PROPOFOL INFUSION 10 MG/ML OPTIME
INTRAVENOUS | Status: DC | PRN
  Administered 2012-08-23 (×2): 25 ug/kg/min via INTRAVENOUS

## 2012-08-23 MED ORDER — NITROGLYCERIN 0.4 MG SL SUBL: 0.4000 mg | SUBLINGUAL_TABLET | SUBLINGUAL | Status: DC | PRN

## 2012-08-23 MED ORDER — GABAPENTIN 100 MG PO CAPS
100.0000 mg | ORAL_CAPSULE | Freq: Three times a day (TID) | ORAL | Status: DC
  Administered 2012-08-23 – 2012-08-26 (×8): 100 mg via ORAL
  Filled 2012-08-23 (×10): qty 1

## 2012-08-23 MED ORDER — PROPOFOL 10 MG/ML IV BOLUS
INTRAVENOUS | Status: DC | PRN
  Administered 2012-08-23: 125 mg via INTRAVENOUS
  Administered 2012-08-23: 200 mg via INTRAVENOUS

## 2012-08-23 MED ORDER — 0.9 % SODIUM CHLORIDE (POUR BTL) OPTIME
TOPICAL | Status: DC | PRN
  Administered 2012-08-23 (×2): 1000 mL

## 2012-08-23 MED ORDER — SODIUM CHLORIDE 0.9 % IJ SOLN: 9.0000 mL | INTRAMUSCULAR | Status: DC | PRN

## 2012-08-23 MED ORDER — TURMERIC 500 MG PO CAPS: 500.0000 mg | ORAL_CAPSULE | Freq: Every day | ORAL | Status: DC

## 2012-08-23 MED ORDER — HEMOSTATIC AGENTS (NO CHARGE) OPTIME
TOPICAL | Status: DC | PRN
  Administered 2012-08-23: 1 via TOPICAL

## 2012-08-23 MED ORDER — MIRTAZAPINE 30 MG PO TABS: 30.0000 mg | ORAL_TABLET | ORAL | Status: DC

## 2012-08-23 MED ORDER — THROMBIN 20000 UNITS EX SOLR
CUTANEOUS | Status: DC | PRN
  Administered 2012-08-23: 20000 [IU] via TOPICAL

## 2012-08-23 MED ORDER — FLEET ENEMA 7-19 GM/118ML RE ENEM: 1.0000 | ENEMA | Freq: Once | RECTAL | Status: AC | PRN

## 2012-08-23 MED ORDER — ACETAMINOPHEN 325 MG PO TABS
650.0000 mg | ORAL_TABLET | ORAL | Status: DC | PRN
  Administered 2012-08-25: 650 mg via ORAL
  Filled 2012-08-23: qty 2

## 2012-08-23 MED ORDER — HYDROMORPHONE HCL PF 1 MG/ML IJ SOLN
INTRAMUSCULAR | Status: AC
  Filled 2012-08-23: qty 1

## 2012-08-23 MED ORDER — HYDROCODONE-ACETAMINOPHEN 5-325 MG PO TABS
1.0000 | ORAL_TABLET | ORAL | Status: DC | PRN
Start: 2012-08-23 — End: 2012-08-26
  Administered 2012-08-24: 2 via ORAL
  Filled 2012-08-23: qty 2

## 2012-08-23 MED ORDER — ONDANSETRON HCL 4 MG/2ML IJ SOLN: 4.0000 mg | Freq: Four times a day (QID) | INTRAMUSCULAR | Status: DC | PRN

## 2012-08-23 MED ORDER — ASPIRIN EC 81 MG PO TBEC
81.0000 mg | DELAYED_RELEASE_TABLET | Freq: Every day | ORAL | Status: DC
  Administered 2012-08-24 – 2012-08-25 (×2): 81 mg via ORAL
  Filled 2012-08-23 (×3): qty 1

## 2012-08-23 MED ORDER — SODIUM CHLORIDE 0.9 % IV SOLN: 500.0000 mL | INTRAVENOUS | Status: DC

## 2012-08-23 MED ORDER — DIPHENHYDRAMINE HCL 50 MG/ML IJ SOLN: 12.5000 mg | Freq: Four times a day (QID) | INTRAMUSCULAR | Status: DC | PRN

## 2012-08-23 MED ORDER — HYDROMORPHONE 0.3 MG/ML IV SOLN
INTRAVENOUS | Status: AC
  Administered 2012-08-23: 16:00:00
  Filled 2012-08-23: qty 25

## 2012-08-23 MED ORDER — SUCCINYLCHOLINE CHLORIDE 20 MG/ML IJ SOLN
INTRAMUSCULAR | Status: DC | PRN
  Administered 2012-08-23: 140 mg via INTRAVENOUS

## 2012-08-23 MED ORDER — DIAZEPAM 5 MG/ML IJ SOLN
2.5000 mg | Freq: Once | INTRAMUSCULAR | Status: AC
  Administered 2012-08-23: 5 mg via INTRAVENOUS

## 2012-08-23 MED ORDER — ACETAMINOPHEN 650 MG RE SUPP: 650.0000 mg | RECTAL | Status: DC | PRN

## 2012-08-23 MED ORDER — KCL IN DEXTROSE-NACL 20-5-0.45 MEQ/L-%-% IV SOLN
INTRAVENOUS | Status: DC
  Administered 2012-08-23 – 2012-08-25 (×3): via INTRAVENOUS
  Filled 2012-08-23 (×7): qty 1000

## 2012-08-23 MED ORDER — TRAMADOL HCL 50 MG PO TABS
100.0000 mg | ORAL_TABLET | Freq: Three times a day (TID) | ORAL | Status: DC | PRN
  Administered 2012-08-25: 100 mg via ORAL
  Filled 2012-08-23: qty 2

## 2012-08-23 MED ORDER — RAMIPRIL 5 MG PO CAPS
5.0000 mg | ORAL_CAPSULE | Freq: Two times a day (BID) | ORAL | Status: DC
  Administered 2012-08-23 – 2012-08-26 (×6): 5 mg via ORAL
  Filled 2012-08-23 (×7): qty 1

## 2012-08-23 MED ORDER — TIZANIDINE HCL 4 MG PO TABS
4.0000 mg | ORAL_TABLET | Freq: Two times a day (BID) | ORAL | Status: DC | PRN
  Administered 2012-08-24 (×2): 4 mg via ORAL
  Filled 2012-08-23 (×4): qty 1

## 2012-08-23 MED ORDER — ONDANSETRON HCL 4 MG/2ML IJ SOLN
4.0000 mg | Freq: Four times a day (QID) | INTRAMUSCULAR | Status: DC | PRN
  Administered 2012-08-25: 4 mg via INTRAVENOUS
  Filled 2012-08-23: qty 2

## 2012-08-23 MED ORDER — SODIUM CHLORIDE 0.9 % IR SOLN
Status: DC | PRN
  Administered 2012-08-23: 11:00:00

## 2012-08-23 MED ORDER — CLONAZEPAM 0.5 MG PO TABS: 0.5000 mg | ORAL_TABLET | Freq: Every day | ORAL | Status: DC

## 2012-08-23 MED ORDER — OMEGA-3 KRILL OIL 300 MG PO CAPS: 300.0000 mg | ORAL_CAPSULE | Freq: Every day | ORAL | Status: DC

## 2012-08-23 MED ORDER — DIAZEPAM 5 MG/ML IJ SOLN
INTRAMUSCULAR | Status: AC
  Filled 2012-08-23: qty 2

## 2012-08-23 MED ORDER — BISACODYL 10 MG RE SUPP: 10.0000 mg | Freq: Every day | RECTAL | Status: DC | PRN

## 2012-08-23 MED ORDER — OMEGA-3-ACID ETHYL ESTERS 1 G PO CAPS
1.0000 g | ORAL_CAPSULE | Freq: Two times a day (BID) | ORAL | Status: DC
  Administered 2012-08-23 – 2012-08-26 (×6): 1 g via ORAL
  Filled 2012-08-23 (×8): qty 1

## 2012-08-23 MED ORDER — MENTHOL 3 MG MT LOZG: 1.0000 | LOZENGE | OROMUCOSAL | Status: DC | PRN

## 2012-08-23 MED ORDER — HYDROMORPHONE HCL PF 1 MG/ML IJ SOLN
0.2500 mg | INTRAMUSCULAR | Status: DC | PRN
  Administered 2012-08-23 (×6): 0.5 mg via INTRAVENOUS

## 2012-08-23 MED ORDER — ONDANSETRON HCL 4 MG/2ML IJ SOLN
INTRAMUSCULAR | Status: DC | PRN
  Administered 2012-08-23: 4 mg via INTRAVENOUS

## 2012-08-23 MED ORDER — BUPIVACAINE HCL (PF) 0.5 % IJ SOLN
INTRAMUSCULAR | Status: DC | PRN
  Administered 2012-08-23: 10 mL

## 2012-08-23 MED ORDER — FENOFIBRATE 160 MG PO TABS
160.0000 mg | ORAL_TABLET | Freq: Every day | ORAL | Status: DC
  Administered 2012-08-23 – 2012-08-26 (×4): 160 mg via ORAL
  Filled 2012-08-23 (×4): qty 1

## 2012-08-23 MED ORDER — BUPIVACAINE HCL (PF) 0.5 % IJ SOLN
INTRAMUSCULAR | Status: DC | PRN
  Administered 2012-08-23: 5 mL

## 2012-08-23 MED ORDER — PANTOPRAZOLE SODIUM 40 MG IV SOLR
40.0000 mg | Freq: Every day | INTRAVENOUS | Status: DC
  Administered 2012-08-23: 40 mg via INTRAVENOUS
  Filled 2012-08-23 (×2): qty 40

## 2012-08-23 MED ORDER — DOCUSATE SODIUM 100 MG PO CAPS
100.0000 mg | ORAL_CAPSULE | Freq: Two times a day (BID) | ORAL | Status: DC
  Administered 2012-08-23 – 2012-08-26 (×6): 100 mg via ORAL
  Filled 2012-08-23 (×6): qty 1

## 2012-08-23 MED ORDER — PHENOL 1.4 % MT LIQD: 1.0000 | OROMUCOSAL | Status: DC | PRN

## 2012-08-23 MED ORDER — SENNOSIDES-DOCUSATE SODIUM 8.6-50 MG PO TABS
1.0000 | ORAL_TABLET | Freq: Every evening | ORAL | Status: DC | PRN
  Administered 2012-08-23: 1 via ORAL
  Filled 2012-08-23: qty 1

## 2012-08-23 MED ORDER — MORPHINE SULFATE (PF) 1 MG/ML IV SOLN
INTRAVENOUS | Status: DC
  Administered 2012-08-23: 9 mg via INTRAVENOUS
  Administered 2012-08-23: 13.5 mg via INTRAVENOUS
  Administered 2012-08-24: 02:00:00 via INTRAVENOUS
  Administered 2012-08-24: 10.5 mg via INTRAVENOUS
  Administered 2012-08-24: 11.18 mg via INTRAVENOUS
  Administered 2012-08-24: 21:00:00 via INTRAVENOUS
  Administered 2012-08-24: 11.85 mg via INTRAVENOUS
  Administered 2012-08-24: 10.5 mg via INTRAVENOUS
  Administered 2012-08-24: 6 mg via INTRAVENOUS
  Administered 2012-08-24: 4.06 mg via INTRAVENOUS
  Administered 2012-08-25: 6 mg via INTRAVENOUS
  Administered 2012-08-25: 3 mg via INTRAVENOUS
  Filled 2012-08-23 (×3): qty 25

## 2012-08-23 MED ORDER — DIAZEPAM 5 MG/ML IJ SOLN
5.0000 mg | Freq: Once | INTRAMUSCULAR | Status: AC
  Administered 2012-08-23: 2.5 mg via INTRAVENOUS

## 2012-08-23 MED ORDER — MIDAZOLAM HCL 5 MG/5ML IJ SOLN
INTRAMUSCULAR | Status: DC | PRN
  Administered 2012-08-23 (×2): 2 mg via INTRAVENOUS

## 2012-08-23 MED ORDER — SENNA 8.6 MG PO TABS
1.0000 | ORAL_TABLET | Freq: Two times a day (BID) | ORAL | Status: DC
  Administered 2012-08-23 – 2012-08-26 (×6): 8.6 mg via ORAL
  Filled 2012-08-23 (×6): qty 1

## 2012-08-23 MED ORDER — LIDOCAINE-EPINEPHRINE 1 %-1:100000 IJ SOLN
INTRAMUSCULAR | Status: DC | PRN
  Administered 2012-08-23: 5 mL

## 2012-08-23 MED ORDER — FAMOTIDINE 20 MG PO TABS
20.0000 mg | ORAL_TABLET | Freq: Every day | ORAL | Status: DC
  Administered 2012-08-24 – 2012-08-26 (×3): 20 mg via ORAL
  Filled 2012-08-23 (×3): qty 1

## 2012-08-23 MED ORDER — NEOSTIGMINE METHYLSULFATE 1 MG/ML IJ SOLN
INTRAMUSCULAR | Status: DC | PRN
  Administered 2012-08-23: 3 mg via INTRAVENOUS

## 2012-08-23 MED ORDER — VALACYCLOVIR HCL 500 MG PO TABS
500.0000 mg | ORAL_TABLET | Freq: Two times a day (BID) | ORAL | Status: DC | PRN
  Filled 2012-08-23: qty 1

## 2012-08-23 MED ORDER — HYDROMORPHONE 0.3 MG/ML IV SOLN
INTRAVENOUS | Status: DC
  Administered 2012-08-23: 16:00:00 via INTRAVENOUS

## 2012-08-23 MED ORDER — SODIUM CHLORIDE 0.9 % IV SOLN
INTRAVENOUS | Status: AC
  Filled 2012-08-23: qty 500

## 2012-08-23 MED ORDER — ASPIRIN 81 MG PO TBEC: 81.0000 mg | DELAYED_RELEASE_TABLET | Freq: Every day | ORAL | Status: DC

## 2012-08-23 MED ORDER — SODIUM CHLORIDE 0.9 % IV SOLN: 250.0000 mL | INTRAVENOUS | Status: DC

## 2012-08-23 MED ORDER — CO Q 10 100 MG PO CAPS: 100.0000 mg | ORAL_CAPSULE | Freq: Every day | ORAL | Status: DC

## 2012-08-23 MED ORDER — FINASTERIDE 5 MG PO TABS
5.0000 mg | ORAL_TABLET | Freq: Every day | ORAL | Status: DC
  Administered 2012-08-24 – 2012-08-26 (×3): 5 mg via ORAL
  Filled 2012-08-23 (×3): qty 1

## 2012-08-23 MED ORDER — DIAZEPAM 5 MG PO TABS: 5.0000 mg | ORAL_TABLET | Freq: Four times a day (QID) | ORAL | Status: DC | PRN

## 2012-08-23 MED ORDER — PHENYLEPHRINE HCL 10 MG/ML IJ SOLN
INTRAMUSCULAR | Status: DC | PRN
  Administered 2012-08-23: 80 ug via INTRAVENOUS

## 2012-08-23 MED ORDER — EPHEDRINE SULFATE 50 MG/ML IJ SOLN
INTRAMUSCULAR | Status: DC | PRN
  Administered 2012-08-23 (×2): 10 mg via INTRAVENOUS
  Administered 2012-08-23: 20 mg via INTRAVENOUS
  Administered 2012-08-23: 10 mg via INTRAVENOUS

## 2012-08-23 MED ORDER — CLONAZEPAM 0.5 MG PO TABS
0.5000 mg | ORAL_TABLET | ORAL | Status: DC
  Administered 2012-08-23 – 2012-08-25 (×2): 0.5 mg via ORAL
  Filled 2012-08-23 (×3): qty 1

## 2012-08-23 MED ORDER — SIMVASTATIN 5 MG PO TABS
5.0000 mg | ORAL_TABLET | Freq: Every day | ORAL | Status: DC
  Administered 2012-08-25: 5 mg via ORAL
  Filled 2012-08-23 (×4): qty 1

## 2012-08-23 MED ORDER — DIPHENHYDRAMINE HCL 12.5 MG/5ML PO ELIX: 12.5000 mg | ORAL_SOLUTION | Freq: Four times a day (QID) | ORAL | Status: DC | PRN

## 2012-08-23 MED ORDER — HYDROCODONE-ACETAMINOPHEN 5-500 MG PO TABS: 1.5000 | ORAL_TABLET | ORAL | Status: DC | PRN

## 2012-08-23 MED ORDER — ZOLPIDEM TARTRATE 5 MG PO TABS: 5.0000 mg | ORAL_TABLET | Freq: Every evening | ORAL | Status: DC | PRN

## 2012-08-23 MED ORDER — OXYCODONE-ACETAMINOPHEN 5-325 MG PO TABS
1.0000 | ORAL_TABLET | ORAL | Status: DC | PRN
  Administered 2012-08-23: 2 via ORAL
  Filled 2012-08-23: qty 2

## 2012-08-23 MED ORDER — DOCUSATE SODIUM 100 MG PO CAPS: 100.0000 mg | ORAL_CAPSULE | Freq: Two times a day (BID) | ORAL | Status: DC

## 2012-08-23 MED ORDER — SODIUM CHLORIDE 0.9 % IJ SOLN: 3.0000 mL | INTRAMUSCULAR | Status: DC | PRN

## 2012-08-23 MED ORDER — ROCURONIUM BROMIDE 100 MG/10ML IV SOLN
INTRAVENOUS | Status: DC | PRN
  Administered 2012-08-23: 20 mg via INTRAVENOUS

## 2012-08-23 MED ORDER — LIDOCAINE HCL (CARDIAC) 20 MG/ML IV SOLN
INTRAVENOUS | Status: DC | PRN
  Administered 2012-08-23: 75 mg via INTRAVENOUS
  Administered 2012-08-23: 25 mg via INTRAVENOUS

## 2012-08-23 MED ORDER — METOPROLOL SUCCINATE ER 25 MG PO TB24
25.0000 mg | ORAL_TABLET | Freq: Every day | ORAL | Status: DC
  Administered 2012-08-24 – 2012-08-26 (×3): 25 mg via ORAL
  Filled 2012-08-23 (×3): qty 1

## 2012-08-23 MED ORDER — CLONAZEPAM 1 MG PO TABS
1.5000 mg | ORAL_TABLET | ORAL | Status: DC
  Administered 2012-08-24: 1.5 mg via ORAL
  Filled 2012-08-23: qty 3

## 2012-08-23 MED ORDER — BACITRACIN 50000 UNITS IM SOLR
INTRAMUSCULAR | Status: AC
  Filled 2012-08-23: qty 1

## 2012-08-23 MED ORDER — MIRTAZAPINE 30 MG PO TABS
30.0000 mg | ORAL_TABLET | ORAL | Status: DC
  Administered 2012-08-23 – 2012-08-25 (×2): 30 mg via ORAL
  Filled 2012-08-23 (×2): qty 1

## 2012-08-23 MED ORDER — LIDOCAINE-EPINEPHRINE 1 %-1:100000 IJ SOLN
INTRAMUSCULAR | Status: DC | PRN
  Administered 2012-08-23: 10 mL

## 2012-08-23 MED ORDER — FENTANYL CITRATE 0.05 MG/ML IJ SOLN
INTRAMUSCULAR | Status: DC | PRN
  Administered 2012-08-23: 50 ug via INTRAVENOUS
  Administered 2012-08-23: 100 ug via INTRAVENOUS
  Administered 2012-08-23: 50 ug via INTRAVENOUS
  Administered 2012-08-23 (×2): 100 ug via INTRAVENOUS

## 2012-08-23 MED ORDER — DIAZEPAM 5 MG/ML IJ SOLN
INTRAMUSCULAR | Status: AC
  Administered 2012-08-23: 2.5 mg
  Filled 2012-08-23: qty 2

## 2012-08-23 MED ORDER — GLYCOPYRROLATE 0.2 MG/ML IJ SOLN
INTRAMUSCULAR | Status: DC | PRN
  Administered 2012-08-23: 0.2 mg via INTRAVENOUS
  Administered 2012-08-23: 0.4 mg via INTRAVENOUS
  Administered 2012-08-23: 0.2 mg via INTRAVENOUS

## 2012-08-23 MED ORDER — ONDANSETRON HCL 4 MG/2ML IJ SOLN: 4.0000 mg | INTRAMUSCULAR | Status: DC | PRN

## 2012-08-23 MED ORDER — SODIUM CHLORIDE 0.9 % IJ SOLN
3.0000 mL | Freq: Two times a day (BID) | INTRAMUSCULAR | Status: DC
  Administered 2012-08-24: 3 mL via INTRAVENOUS

## 2012-08-23 SURGICAL SUPPLY — 92 items
6.5 x50mm Precept shank ×1 IMPLANT
ADH SKN CLS APL DERMABOND .7 (GAUZE/BANDAGES/DRESSINGS) ×3
APL SKNCLS STERI-STRIP NONHPOA (GAUZE/BANDAGES/DRESSINGS) ×1
BAG DECANTER FOR FLEXI CONT (MISCELLANEOUS) ×3 IMPLANT
BENZOIN TINCTURE PRP APPL 2/3 (GAUZE/BANDAGES/DRESSINGS) ×2 IMPLANT
BLADE SURG ROTATE 9660 (MISCELLANEOUS) IMPLANT
BONE VOID FILLER STRIP 10CC (Bone Implant) ×1 IMPLANT
BUR MATCHSTICK NEURO 3.0 LAGG (BURR) ×1 IMPLANT
BUR ROUND FLUTED 5 RND (BURR) ×1 IMPLANT
CLOTH BEACON ORANGE TIMEOUT ST (SAFETY) ×4 IMPLANT
CONT SPEC 4OZ CLIKSEAL STRL BL (MISCELLANEOUS) ×5 IMPLANT
COROENT 8X10X30 (Cage) ×1 IMPLANT
COVER BACK TABLE 24X17X13 BIG (DRAPES) IMPLANT
COVER TABLE BACK 60X90 (DRAPES) ×3 IMPLANT
DERMABOND ADVANCED (GAUZE/BANDAGES/DRESSINGS) ×3
DERMABOND ADVANCED .7 DNX12 (GAUZE/BANDAGES/DRESSINGS) ×2 IMPLANT
DRAPE C-ARM 42X72 X-RAY (DRAPES) ×4 IMPLANT
DRAPE C-ARMOR (DRAPES) ×4 IMPLANT
DRAPE LAPAROTOMY 100X72X124 (DRAPES) ×4 IMPLANT
DRAPE POUCH INSTRU U-SHP 10X18 (DRAPES) ×4 IMPLANT
DRAPE SURG 17X23 STRL (DRAPES) ×4 IMPLANT
DRESSING TELFA 8X3 (GAUZE/BANDAGES/DRESSINGS) ×2 IMPLANT
DURAPREP 26ML APPLICATOR (WOUND CARE) ×4 IMPLANT
ELECT BLADE 4.0 EZ CLEAN MEGAD (MISCELLANEOUS) ×2
ELECT REM PT RETURN 9FT ADLT (ELECTROSURGICAL) ×2
ELECTRODE BLDE 4.0 EZ CLN MEGD (MISCELLANEOUS) IMPLANT
ELECTRODE REM PT RTRN 9FT ADLT (ELECTROSURGICAL) ×2 IMPLANT
GAUZE SPONGE 4X4 16PLY XRAY LF (GAUZE/BANDAGES/DRESSINGS) ×3 IMPLANT
GLOVE BIO SURGEON STRL SZ8 (GLOVE) ×6 IMPLANT
GLOVE BIOGEL PI IND STRL 6.5 (GLOVE) IMPLANT
GLOVE BIOGEL PI IND STRL 8 (GLOVE) ×2 IMPLANT
GLOVE BIOGEL PI IND STRL 8.5 (GLOVE) ×3 IMPLANT
GLOVE BIOGEL PI INDICATOR 6.5 (GLOVE) ×1
GLOVE BIOGEL PI INDICATOR 8 (GLOVE) ×2
GLOVE BIOGEL PI INDICATOR 8.5 (GLOVE) ×1
GLOVE ECLIPSE 7.5 STRL STRAW (GLOVE) ×5 IMPLANT
GLOVE ECLIPSE 8.0 STRL XLNG CF (GLOVE) ×4 IMPLANT
GLOVE EXAM NITRILE LRG STRL (GLOVE) IMPLANT
GLOVE EXAM NITRILE MD LF STRL (GLOVE) IMPLANT
GLOVE EXAM NITRILE XL STR (GLOVE) IMPLANT
GLOVE EXAM NITRILE XS STR PU (GLOVE) IMPLANT
GLOVE INDICATOR 7.5 STRL GRN (GLOVE) ×1 IMPLANT
GOWN BRE IMP SLV AUR LG STRL (GOWN DISPOSABLE) ×3 IMPLANT
GOWN BRE IMP SLV AUR XL STRL (GOWN DISPOSABLE) ×6 IMPLANT
GOWN STRL REIN 2XL LVL4 (GOWN DISPOSABLE) ×5 IMPLANT
GUIDEWIRE NITINOL BEVEL TIP (WIRE) ×6 IMPLANT
IMPLANT COROENT XL 8X18X55 (Intraocular Lens) ×1 IMPLANT
KIT BASIN OR (CUSTOM PROCEDURE TRAY) ×3 IMPLANT
KIT DILATOR XLIF 5 (KITS) IMPLANT
KIT HOOP SHIM (KITS) ×1 IMPLANT
KIT INFUSE MEDIUM (Orthopedic Implant) ×1 IMPLANT
KIT MAXCESS (KITS) ×1 IMPLANT
KIT POSITION SURG JACKSON T1 (MISCELLANEOUS) ×2 IMPLANT
KIT ROOM TURNOVER OR (KITS) ×4 IMPLANT
KIT XLIF (KITS) ×1
LIGHT SOURCE ANGLE TIP STR 7FT (MISCELLANEOUS) ×1 IMPLANT
MARKER SKIN DUAL TIP RULER LAB (MISCELLANEOUS) ×2 IMPLANT
MILL MEDIUM DISP (BLADE) ×1 IMPLANT
NDL HYPO 25X1 1.5 SAFETY (NEEDLE) ×2 IMPLANT
NDL I PASS (NEEDLE) IMPLANT
NEEDLE HYPO 25X1 1.5 SAFETY (NEEDLE) ×2 IMPLANT
NEEDLE I PASS (NEEDLE) ×2 IMPLANT
NS IRRIG 1000ML POUR BTL (IV SOLUTION) ×3 IMPLANT
NVM5 GUIDANCE CLIP ×1 IMPLANT
PACK LAMINECTOMY NEURO (CUSTOM PROCEDURE TRAY) ×3 IMPLANT
PAD ARMBOARD 7.5X6 YLW CONV (MISCELLANEOUS) ×8 IMPLANT
PATTIES SURGICAL .5 X.5 (GAUZE/BANDAGES/DRESSINGS) IMPLANT
PATTIES SURGICAL .5 X1 (DISPOSABLE) IMPLANT
PATTIES SURGICAL 1X1 (DISPOSABLE) IMPLANT
PRECEPT SHANK 6.5X45 (Neuro Prosthesis/Implant) ×1 IMPLANT
PRECEPT TULIPS (Neuro Prosthesis/Implant) ×2 IMPLANT
ROD PREBENT TI 65MM (Rod) ×2 IMPLANT
SCREW POLYAXIAL 6.5X45MM (Screw) ×3 IMPLANT
SCREW PRECEPT 6.5X50 (Screw) ×3 IMPLANT
SCREW PRECEPT SET (Screw) ×6 IMPLANT
SPONGE GAUZE 4X4 12PLY (GAUZE/BANDAGES/DRESSINGS) ×1 IMPLANT
SPONGE LAP 4X18 X RAY DECT (DISPOSABLE) IMPLANT
STAPLER SKIN PROX WIDE 3.9 (STAPLE) ×2 IMPLANT
STRIP CLOSURE SKIN 1/2X4 (GAUZE/BANDAGES/DRESSINGS) ×2 IMPLANT
SUT VIC AB 1 CT1 18XBRD ANBCTR (SUTURE) ×3 IMPLANT
SUT VIC AB 1 CT1 8-18 (SUTURE) ×2
SUT VIC AB 2-0 CP2 18 (SUTURE) ×1 IMPLANT
SUT VIC AB 2-0 CT1 18 (SUTURE) ×8 IMPLANT
SUT VIC AB 3-0 SH 8-18 (SUTURE) ×8 IMPLANT
SYR 20ML ECCENTRIC (SYRINGE) ×4 IMPLANT
SYR INSULIN 1ML 31GX6 SAFETY (SYRINGE) IMPLANT
TAPE CLOTH 3X10 TAN LF (GAUZE/BANDAGES/DRESSINGS) ×6 IMPLANT
TOWEL OR 17X24 6PK STRL BLUE (TOWEL DISPOSABLE) ×4 IMPLANT
TOWEL OR 17X26 10 PK STRL BLUE (TOWEL DISPOSABLE) ×4 IMPLANT
TRAP SPECIMEN MUCOUS 40CC (MISCELLANEOUS) ×1 IMPLANT
TRAY FOLEY CATH 14FRSI W/METER (CATHETERS) ×3 IMPLANT
WATER STERILE IRR 1000ML POUR (IV SOLUTION) ×4 IMPLANT

## 2012-08-23 NOTE — Progress Notes (Signed)
Patient awake, alert, conversant.  MAEW with good strength.

## 2012-08-23 NOTE — Anesthesia Postprocedure Evaluation (Signed)
Anesthesia Post Note  Patient: [name]  Procedure(s) Performed: Procedure(s) (LRB): ANTERIOR LATERAL LUMBAR FUSION 2 LEVELS (N/A) LUMBAR PERCUTANEOUS PEDICLE SCREW 2 LEVEL (N/A)  Anesthesia type: General  Patient location: PACU  Post pain: Pain level controlled and Adequate analgesia  Post assessment: Post-op Vital signs reviewed, Patient's Cardiovascular Status Stable, Respiratory Function Stable, Patent Airway and Pain level controlled  Last Vitals:  Filed Vitals:   08/23/12 1515  BP:   Pulse: 60  Temp:   Resp: 12    Post vital signs: Reviewed and stable  Level of consciousness: awake, alert  and oriented  Complications: No apparent anesthesia complications

## 2012-08-23 NOTE — Transfer of Care (Signed)
Immediate Anesthesia Transfer of Care Note  Patient: [name]  Procedure(s) Performed: Procedure(s) (LRB) with comments: ANTERIOR LATERAL LUMBAR FUSION 2 LEVELS (N/A) - Left Sided Lumbar three-four, Anterolateral Decompression/fusion LUMBAR PERCUTANEOUS PEDICLE SCREW 2 LEVEL (N/A) - Lumbar three-four, four-five fusion with percutaneous pedicle screws.  Patient Location: PACU  Anesthesia Type: General  Level of Consciousness: awake, alert , oriented and sedated  Airway & Oxygen Therapy: Patient Spontanous Breathing and Patient connected to nasal cannula oxygen  Post-op Assessment: Report given to PACU RN, Post -op Vital signs reviewed and stable and Patient moving all extremities  Post vital signs: Reviewed and stable  Complications: No apparent anesthesia complications

## 2012-08-23 NOTE — Progress Notes (Signed)
Pt much more relaxed, pain better, VSS. Dr Chaney Malling updated.

## 2012-08-23 NOTE — Progress Notes (Signed)
Patient reported to me he think , he might have fallen at the OR, verified from PACU and OR, but was told nothing of that sort happened.

## 2012-08-23 NOTE — OR Nursing (Signed)
First procedure ended @ 1004. Second part of procedure began @ 1043.

## 2012-08-23 NOTE — Progress Notes (Signed)
Pt is very tense, tends to hold his breath and can't seem to relax.  VSS, + strength to all extremities.  Dr Ladene Artist here and aware.  New order for addit. IV Dilaudid.  Will cont to monitor closely.

## 2012-08-23 NOTE — Preoperative (Signed)
Beta Blockers   Reason not to administer Beta Blockers:Not Applicable 

## 2012-08-23 NOTE — Op Note (Signed)
08/23/2012  2:34 PM  PATIENT:  [name]  61 y.o. male  PRE-OPERATIVE DIAGNOSIS:  Lumbar spondylosis, Lumbar stenosis, Lumbago, Lumbar radiculopathy L3/4, L4/5  POST-OPERATIVE DIAGNOSIS:  Lumbar spondylosis, Lumbar stenosis, Lumbago, Lumbar radiculopathy L3/4, L4/5  PROCEDURE:  Procedure(s) (LRB) with comments: ANTERIOR LATERAL LUMBAR FUSION 2 LEVELS (N/A) - Left Sided Lumbar three-four, Anterolateral Decompression/fusion PEEK Cage 8 mm L3/4 LUMBAR PERCUTANEOUS PEDICLE SCREW 2 LEVEL (N/A) - Lumbar three-four, four-five fusion MAS TLIF with PEEK cage and autograft  with percutaneous pedicle screws L3-L5 bilaterally  SURGEON:  Surgeon(s) and Role:    * Maeola Harman, MD - Primary    * Reinaldo Meeker, MD - Assisting  PHYSICIAN ASSISTANT:   ASSISTANTS: Poteat, RN   ANESTHESIA:   general  EBL:  Total I/O In: 3300 [I.V.:3300] Out: 1775 [Urine:1700; Blood:75]  BLOOD ADMINISTERED:none  DRAINS: none   LOCAL MEDICATIONS USED:  LIDOCAINE   SPECIMEN:  No Specimen  DISPOSITION OF SPECIMEN:  N/A  COUNTS:  YES  TOURNIQUET:  * No tourniquets in log *  DICTATION: Patient is a 61 year old with spondylosis , disc herniation and severe back and bilateral lower extremity pain at L3/4 and L4/5 levels of the lumbar spine. It was elected to take him  to surgery for anterolateral decompression and posterior pedicle screw fixation with the possibility that we might not be able to perform XLIF at L4/5 if neural anatomy not conducive to lateral approach.  Procedure: Patient was brought to the operating room and placed in a right lateral decubitus position on the operative table and using orthogonally projected C-arm fluoroscopy the patient was placed so that the L4/5 and L3/4   levels were visualized in AP and lateral plane. The patient was then taped into position. The table was flexed so as to expose these levels. Skin was marked along with a posterior finger dissection incision. His flank was  then prepped and draped in usual sterile fashion and incisions were made at the L4/5 level. Posterior finger dissection was made to enter the retroperitoneal space and then subsequently the probe was inserted into the psoas muscle from the left side initially at the L4/5 level. After mapping the neural elements were unable to dock the probe on the midpoint of this vertebral level and without indications electrically of too close proximity to the neural tissues. Subsequently the self-retaining tractor was.after sequential dilators were utilized the shim was employed and the interspace and were only able to gain access at the anterior border and even at this location, posterior stimulation numbers were low.  It was therefore elected not to perform XLIF at L4/5 and we moved to the L3/4 level. I was able to dock at the midpoint of the vertebra with low stimulation.  Sequential dilators were used was cleared of psoas muscle, the shim was docked, the interspace was then incised. A thorough discectomy was performed. Straight instruments were used to clear the interspace of disc material. After thorough discectomy was performed and this was performed using AP and lateral fluoroscopy an 8 by 55 x 18 mm implant was packed with BMP and NexOss. This was tamped into position using the slides and its position was confirmed on AP and lateral fluoroscopy.Hemostasis was assured the wounds were irrigated and closed with interrupted Vicryl sutures.  Patient was then turned into a prone position on the operating table on chest rolls and using AP and lateral fluoroscopy throughout this portion of the procedure, pedicle screws were placed using Nuvasive cannulated percutaneous  screws. 2 screws were placed at L3 and (6.5 x 45 mm) and 2 at L4 of similar size and 2 6.5 x 45 at L5. Prior to placing all screws, a MAS TLIF was performed with thorough decompression of the thecal sac and L4 and L5 nerve roots with facetectomy.  This bone was  morselized and used for autograft.  An 8 x 30 mm TLIF implant was packed with BMP and NexOss after placing the autograft in the interspace. Remaining screws were placed and 65 mm rods were passed  And the screws were locked, towers torqued and disassembled.Final Xrays showed well positioned implants and screw fixation. The wounds were irrigated and then closed with 1, 2-0 and 3-0 Vicryl stitches. Sterile occlusive dressing was placed with Dermabond. The patient was then extubated in the operating room and taken to recovery in stable and satisfactory condition having tolerated his operation well. Counts were correct at the end of the case.   PLAN OF CARE: Admit to inpatient   PATIENT DISPOSITION:  PACU - hemodynamically stable.   Delay start of Pharmacological VTE agent (>24hrs) due to surgical blood loss or risk of bleeding: yes

## 2012-08-23 NOTE — Interval H&P Note (Signed)
History and Physical Interval Note:  08/23/2012 6:31 AM  [name]  has presented today for surgery, with the diagnosis of Lumbar spondylosis, Lumbar stenosis, Lumbago, Lumbar radiculopathy  The various methods of treatment have been discussed with the patient and family. After consideration of risks, benefits and other options for treatment, the patient has consented to  Procedure(s) (LRB) with comments: ANTERIOR LATERAL LUMBAR FUSION 2 LEVELS (N/A) - Left Sided L3-4 L4-5 Anterolateral Decompression/fusion/percutaneous pedicle screws LUMBAR PERCUTANEOUS PEDICLE SCREW 2 LEVEL (N/A) as a surgical intervention .  The patient's history has been reviewed, patient examined, no change in status, stable for surgery.  I have reviewed the patient's chart and labs.  Questions were answered to the patient's satisfaction.    Patient is aware that L4/5 level may be difficult to access from lateral approach and he may require MIS TLIF at L4/5 level if access proves impossible from lateral approach.  [name] D  Date of Initial H&P: 08/22/2012  History reviewed, patient examined, no change in status, stable for surgery.

## 2012-08-23 NOTE — Progress Notes (Signed)
Care of pt assumed by MA Kinley Dozier RN 

## 2012-08-23 NOTE — Progress Notes (Signed)
PHARMACIST - PHYSICIAN ORDER COMMUNICATION  CONCERNING: P&T Medication Policy on Herbal Medications  DESCRIPTION:  This patient's order for:  CoQ10 and Tumeric has been noted.  This product(s) is classified as an "herbal" or natural product. Due to a lack of definitive safety studies or FDA approval, nonstandard manufacturing practices, plus the potential risk of unknown drug-drug interactions while on inpatient medications, the Pharmacy and Therapeutics Committee does not permit the use of "herbal" or natural products of this type within [facility].   ACTION TAKEN: The pharmacy department is unable to verify this order at this time and your patient has been informed of this safety policy. Please reevaluate patient's clinical condition at discharge and address if the herbal or natural product(s) should be resumed at that time.

## 2012-08-23 NOTE — Progress Notes (Signed)
Pt still c/o severe back pain, at mid- back area despite 3mg  IV Dilaudid and 5mg  IV Valium.  He is very tense and can't seem to relax, clenching his fists and holding his breath.  Dr Venetia Maxon here at bedside--new order for addit 5 mg IV Valium. Will cont to monitor closely.  I have updated his wife and she is aware of pain issues.

## 2012-08-24 MED ORDER — HYDROCODONE-ACETAMINOPHEN 10-325 MG PO TABS
1.0000 | ORAL_TABLET | ORAL | Status: DC | PRN
  Administered 2012-08-24: 2 via ORAL
  Administered 2012-08-24: 1 via ORAL
  Administered 2012-08-25 – 2012-08-26 (×6): 2 via ORAL
  Filled 2012-08-24 (×2): qty 2
  Filled 2012-08-24: qty 1
  Filled 2012-08-24 (×5): qty 2

## 2012-08-24 NOTE — Progress Notes (Signed)
Subjective: Patient reports much better today.  Less pain in back, legs good  Objective: Vital signs in last 24 hours: Temp:  [97.5 F (36.4 C)-98.7 F (37.1 C)] 98.2 F (36.8 C) (09/11 0614) Pulse Rate:  [42-96] 96  (09/11 0614) Resp:  [8-18] 18  (09/11 0614) BP: (102-137)/(55-91) 102/55 mmHg (09/11 0614) SpO2:  [43 %-100 %] 95 % (09/11 0614) Weight:  [88 kg (194 lb 0.1 oz)] 88 kg (194 lb 0.1 oz) (09/10 1715)  Intake/Output from previous day: 09/10 0701 - 09/11 0700 In: 4221.3 [I.V.:4021.3; IV Piggyback:200] Out: 3825 [Urine:3750; Blood:75] Intake/Output this shift:    Physical Exam: Up ambulating in brace with PT without difficulty.  Lab Results: No results found for this basename: WBC:2,HGB:2,HCT:2,PLT:2 in the last 72 hours BMET No results found for this basename: NA:2,K:2,CL:2,CO2:2,GLUCOSE:2,BUN:2,CREATININE:2,CALCIUM:2 in the last 72 hours  Studies/Results: Dg Lumbar Spine Complete  08/23/2012  *RADIOLOGY REPORT*  Clinical Data: L3-4 XLIF.  L4-5 TLIF  LUMBAR SPINE - COMPLETE 4+ VIEW  Technique: Intraoperative fluoro spot images are provided.  Comparison:  MRI lumbar spine 07/07/2012.  Findings: The patient is status post discectomy and fusion at L3-4 and L4-5.  The disc heights have been restored.  Alignment is anatomic.  IMPRESSION: Status post discectomy and fixation at L3-4 and L4-5 without radiographic evidence for complication.   Original Report Authenticated By: Jamesetta Orleans. MATTERN, M.D.    Dg C-arm Gt 120 Min  08/23/2012  *RADIOLOGY REPORT*  Clinical Data: L3-4 XLIF.  L4-5 TLIF  LUMBAR SPINE - COMPLETE 4+ VIEW  Technique: Intraoperative fluoro spot images are provided.  Comparison:  MRI lumbar spine 07/07/2012.  Findings: The patient is status post discectomy and fusion at L3-4 and L4-5.  The disc heights have been restored.  Alignment is anatomic.  IMPRESSION: Status post discectomy and fixation at L3-4 and L4-5 without radiographic evidence for complication.    Original Report Authenticated By: Jamesetta Orleans. MATTERN, M.D.     Assessment/Plan: Continue PCA today.  Continue PT.    LOS: 1 day    Rael Yo D, MD 08/24/2012, 7:30 AM

## 2012-08-24 NOTE — Evaluation (Signed)
Physical Therapy Evaluation Patient Details Name: [name] MRN: [id number] DOB: 1951/11/25 Today's Date: 08/24/2012 Time: 4098-1191 PT Time Calculation (min): 28 min  PT Assessment / Plan / Recommendation Clinical Impression  Pt is a 61 yo male s/p L3-4, 4-5, L5-S1 fusion post op day 1. Pt presents to physical therpay evaluation with decreased mobility secondary to surgery and pain and will beenfit from skilled physical therapy in the acute care setting to maximize independence prior to discharge home. Recommending no follow up PT.    PT Assessment  Patient needs continued PT services    Follow Up Recommendations  No PT follow up;Supervision for mobility/OOB    Barriers to Discharge        Equipment Recommendations  Rolling walker with 5" wheels    Recommendations for Other Services     Frequency Min 5X/week    Precautions / Restrictions Precautions Precautions: Back Precaution Booklet Issued: Yes (comment) Precaution Comments: Pt educated on 3/3 back precautions and log rolling technique for bed mobility. Pt given back precuations handout. Required Braces or Orthoses: Spinal Brace Spinal Brace: Lumbar corset Restrictions Weight Bearing Restrictions: No         Mobility  Bed Mobility Bed Mobility: Supine to Sit Supine to Sit: 5: Supervision Details for Bed Mobility Assistance: Pt sitting up with HOB elevated to approx 60 degrees and pt moved LE off bed to sitting EOB. Pt eudcated on log rolling for all bed mobility in the future and pt verbalized understanding. Transfers Transfers: Sit to Stand;Stand to Sit Sit to Stand: 3: Mod assist;With upper extremity assist;From bed Stand to Sit: 4: Min assist;To chair/3-in-1;With upper extremity assist;With armrests Details for Transfer Assistance: Pt requried facilitation at upper trunk and sacrum and verbal cueing for safe hand placement and upright posture upon standing. During sit->stand, pt had to utilize UE strength and  had difficulty straightening bil hips/knees, requiring increased superior facilitation. Ambulation/Gait Ambulation/Gait Assistance: 4: Min guard Ambulation Distance (Feet): 200 Feet Assistive device: Rolling walker Ambulation/Gait Assistance Details: Pt required verbal cueing for safe use of RW (keeping both hands on walker, not abandoning RW prior to sititng down) and upright posture. Gait Pattern: Step-through pattern;Decreased stride length;Decreased trunk rotation;Trunk flexed Stairs: No    Exercises     PT Diagnosis: Difficulty walking;Abnormality of gait;Acute pain  PT Problem List: Decreased activity tolerance;Decreased balance;Decreased mobility;Decreased knowledge of use of DME;Decreased knowledge of precautions PT Treatment Interventions: DME instruction;Gait training;Stair training;Functional mobility training;Therapeutic activities;Therapeutic exercise;Balance training;Neuromuscular re-education;Patient/family education   PT Goals Acute Rehab PT Goals PT Goal Formulation: With patient Time For Goal Achievement: 08/24/12 Potential to Achieve Goals: Good Pt will Roll Supine to Right Side: with modified independence PT Goal: Rolling Supine to Right Side - Progress: Goal set today Pt will Roll Supine to Left Side: with modified independence PT Goal: Rolling Supine to Left Side - Progress: Goal set today Pt will go Supine/Side to Sit: with modified independence PT Goal: Supine/Side to Sit - Progress: Goal set today Pt will go Sit to Stand: with modified independence PT Goal: Sit to Stand - Progress: Goal set today Pt will go Stand to Sit: with modified independence PT Goal: Stand to Sit - Progress: Goal set today Pt will Ambulate: >150 feet;with modified independence;with least restrictive assistive device PT Goal: Ambulate - Progress: Goal set today Pt will Go Up / Down Stairs: 3-5 stairs;with supervision;with rail(s) PT Goal: Up/Down Stairs - Progress: Goal set  today Additional Goals Additional Goal #1: Pt will verbalize 3/3  back precautions and demonstrate understanding during all functional mobility. PT Goal: Additional Goal #1 - Progress: Goal set today  Visit Information  Last PT Received On: 08/24/12 Assistance Needed: +1    Subjective Data  Subjective: "I am feeling better today than yesterday."   Prior Functioning  Home Living Lives With: Spouse Available Help at Discharge: Available 24 hours/day;Family Type of Home: House Home Access: Stairs to enter [employer] of Steps: 3 Entrance Stairs-Rails: Left Home Layout: One level Bathroom Shower/Tub: Health visitor: Standard Home Adaptive Equipment: Built-in shower seat;Bedside commode/3-in-1;Walker - four wheeled;Walker - standard;Walker - rolling Prior Function Level of Independence: Independent Able to Take Stairs?: Yes Driving: Yes Vocation: Retired Musician: No difficulties    Cognition  Overall Cognitive Status: Appears within functional limits for tasks assessed/performed Arousal/Alertness: Awake/alert Orientation Level: Appears intact for tasks assessed Behavior During Session: [facility] for tasks performed Cognition - Other Comments: Pt slightly impulsive.    Extremity/Trunk Assessment Right Upper Extremity Assessment RUE ROM/Strength/Tone: WFL for tasks assessed Left Upper Extremity Assessment LUE ROM/Strength/Tone: WFL for tasks assessed Right Lower Extremity Assessment RLE ROM/Strength/Tone: WFL for tasks assessed Left Lower Extremity Assessment LLE ROM/Strength/Tone: WFL for tasks assessed Trunk Assessment Trunk Assessment: Normal   Balance Balance Balance Assessed: Yes Static Standing Balance Static Standing - Level of Assistance: 5: Stand by assistance Static Standing - Comment/# of Minutes: Pt stood in hall after walking approx 75 feet to talk to MD with minguard/SBA with no LOB.  End of Session PT - End of  Session Equipment Utilized During Treatment: Gait belt;Back brace Activity Tolerance: Patient tolerated treatment well Patient left: in chair;with call bell/phone within reach;with nursing in room;with family/visitor present Nurse Communication: Mobility status  GP     Sabrina Arriaga 08/24/2012, 8:52 AM

## 2012-08-24 NOTE — Evaluation (Signed)
I have read and agree with the below assessment and plan.   Emmry Hinsch Helen Whitlow PT, DPT Pager: [phone] 

## 2012-08-24 NOTE — Progress Notes (Signed)
Occupational Therapy Evaluation Patient Details Name: [name] MRN: [id number] DOB: [birth year] Today's Date: 08/24/2012 Time: 4098-1191 OT Time Calculation (min): 31 min  OT Assessment / Plan / Recommendation Clinical Impression  Pt s/p anterior lateral lumbar fusion thus affeting PLOF. Will benefit from acute OT services to address below problem list in prep for safe d/c home with wife.    OT Assessment  Patient needs continued OT Services    Follow Up Recommendations  No OT follow up;Supervision/Assistance - 24 hour    Barriers to Discharge      Equipment Recommendations  Rolling walker with 5" wheels;None recommended by OT    Recommendations for Other Services    Frequency  Min 2X/week    Precautions / Restrictions Precautions Precautions: Back Precaution Booklet Issued: Yes (comment) Precaution Comments: Pt educated on 3/3 back precautions and log rolling technique for bed mobility. Pt given back precuations handout. Required Braces or Orthoses: Spinal Brace Spinal Brace: Lumbar corset   Pertinent Vitals/Pain See vitals    ADL  Eating/Feeding: Performed;Independent Where Assessed - Eating/Feeding: Chair Lower Body Bathing: Simulated;Moderate assistance Where Assessed - Lower Body Bathing: Unsupported sitting Lower Body Dressing: Simulated;Moderate assistance Where Assessed - Lower Body Dressing: Unsupported sitting Toilet Transfer: Simulated;Minimal assistance Toilet Transfer Method: Sit to stand Toilet Transfer Equipment:  (chair) Equipment Used: Back brace;Gait belt;Rolling walker Transfers/Ambulation Related to ADLs: min guard with RW during ambulation ADL Comments: Educated pt on back precautions and ADL techniques.  Pt unable to cross ankles over knees to perform LB tasks but reports he was able to prior to sx.    OT Diagnosis: Generalized weakness;Acute pain  OT Problem List: Decreased strength;Decreased activity tolerance;Decreased knowledge of use of  DME or AE;Decreased knowledge of precautions;Decreased safety awareness;Pain OT Treatment Interventions: Self-care/ADL training;Patient/family education;Therapeutic activities;DME and/or AE instruction   OT Goals Acute Rehab OT Goals OT Goal Formulation: With patient Time For Goal Achievement: 08/31/12 Potential to Achieve Goals: Good ADL Goals Pt Will Perform Grooming: with modified independence;Standing at sink ADL Goal: Grooming - Progress: Goal set today Pt Will Perform Lower Body Bathing: with modified independence;Sit to stand from chair;Sit to stand from bed ADL Goal: Lower Body Bathing - Progress: Goal set today Pt Will Perform Lower Body Dressing: with modified independence;Sit to stand from chair;Sit to stand from bed ADL Goal: Lower Body Dressing - Progress: Goal set today Pt Will Transfer to Toilet: with modified independence;Ambulation;with DME;Comfort height toilet;Maintaining back safety precautions ADL Goal: Toilet Transfer - Progress: Goal set today Pt Will Perform Tub/Shower Transfer: Shower transfer;with modified independence;Ambulation;with DME;Maintaining back safety precautions ADL Goal: Tub/Shower Transfer - Progress: Goal set today Miscellaneous OT Goals Miscellaneous OT Goal #1: Pt will independently verbalize and generalize 3/3 back precautions during all ADLs. OT Goal: Miscellaneous Goal #1 - Progress: Goal set today Miscellaneous OT Goal #2: Pt will perform bed mobility with mod I in prep for EOB ADLs. OT Goal: Miscellaneous Goal #2 - Progress: Goal set today  Visit Information  Last OT Received On: 08/24/12 Assistance Needed: +1    Subjective Data      Prior Functioning  Vision/Perception  Home Living Lives With: Spouse Available Help at Discharge: Available 24 hours/day;Family Type of Home: House Home Access: Stairs to enter [employer] of Steps: 3 Entrance Stairs-Rails: Left Home Layout: One level Bathroom Shower/Tub: Architectural technologist: Standard Home Adaptive Equipment: Built-in shower seat;Bedside commode/3-in-1;Walker - four wheeled;Walker - standard;Walker - rolling Prior Function Level of Independence: Independent Able to Take  Stairs?: Yes Driving: Yes Vocation: Retired Musician: No difficulties      Cognition  Overall Cognitive Status: Appears within functional limits for tasks assessed/performed Arousal/Alertness: Awake/alert Orientation Level: Appears intact for tasks assessed Behavior During Session: [facility] for tasks performed Cognition - Other Comments: Pt slightly impulsive and attempted to stand by himself before therapist was next to him and without RW.    Extremity/Trunk Assessment Right Upper Extremity Assessment RUE ROM/Strength/Tone: [facility] for tasks assessed Left Upper Extremity Assessment LUE ROM/Strength/Tone: WFL for tasks assessed   Mobility  Shoulder Instructions  Bed Mobility Bed Mobility: Not assessed (Pt up in chair upon OT arrival) Transfers Transfers: Sit to Stand;Stand to Sit Sit to Stand: 4: Min assist;From chair/3-in-1;With upper extremity assist;With armrests Stand to Sit: 4: Min guard;To chair/3-in-1;With armrests;With upper extremity assist Details for Transfer Assistance: VC's for safest hand placement.       Exercise     Balance     End of Session OT - End of Session Equipment Utilized During Treatment: Gait belt;Back brace Activity Tolerance: Patient tolerated treatment well Patient left: in chair;with call bell/phone within reach;with family/visitor present Nurse Communication: Mobility status  GO    08/24/2012 Cipriano Mile OTR/L Pager [phone] Office [phone]  Cipriano Mile 08/24/2012, 3:09 PM

## 2012-08-24 NOTE — Progress Notes (Signed)
Occupational Therapy Treatment Patient Details Name: [name] MRN: [id number] DOB: 1951/09/12 Today's Date: 08/24/2012 Time: 1191-4782 OT Time Calculation (min): 11 min  OT Assessment / Plan / Recommendation Comments on Treatment Session Pt continues to require reinforcement on need for assistance and to maintain back precautions.    Follow Up Recommendations  No OT follow up;Supervision/Assistance - 24 hour    Barriers to Discharge       Equipment Recommendations  Rolling walker with 5" wheels;None recommended by OT    Recommendations for Other Services    Frequency Min 2X/week   Plan Discharge plan remains appropriate    Precautions / Restrictions Precautions Precautions: Back Precaution Booklet Issued: Yes (comment) Precaution Comments: Pt educated on 3/3 back precautions and log rolling technique for bed mobility. Pt given back precuations handout. Required Braces or Orthoses: Spinal Brace Spinal Brace: Lumbar corset   Pertinent Vitals/Pain See vitals    ADL    Toilet Transfer: Performed;Minimal assistance Toilet Transfer Method: Sit to stand Toilet Transfer Equipment: Comfort height toilet;Grab bars Toileting - Clothing Manipulation and Hygiene: Performed;Min guard Where Assessed - Engineer, mining and Hygiene: Standing Equipment Used: Back brace;Rolling walker Transfers/Ambulation Related to ADLs: Min guard with RW for safety ADL Comments: Pt attempting to get OOB without assistance upon OT arrival.  Edcuated pt on need for assistance when getting OOB for safety (pain medications, IV lines and leads).  Verbal cueing on maintain back precautions throughout session. Pt donned back brace with setup to retrieve brace while sitting EOB. Pt requesting to sit on toilet for a while. Educated pt to pull call button next to commode when finished and wait for RN to arrive to assist.    OT Diagnosis: Generalized weakness;Acute pain  OT Problem List: Decreased  strength;Decreased activity tolerance;Decreased knowledge of use of DME or AE;Decreased knowledge of precautions;Decreased safety awareness;Pain OT Treatment Interventions: Self-care/ADL training;Patient/family education;Therapeutic activities;DME and/or AE instruction   OT Goals Acute Rehab OT Goals OT Goal Formulation: With patient Time For Goal Achievement: 08/31/12 Potential to Achieve Goals: Good ADL Goals  Pt Will Transfer to Toilet: with modified independence;Ambulation;with DME;Comfort height toilet;Maintaining back safety precautions ADL Goal: Toilet Transfer - Progress: Progressing toward goals Miscellaneous OT Goals Miscellaneous OT Goal #1: Pt will independently verbalize and generalize 3/3 back precautions during all ADLs. OT Goal: Miscellaneous Goal #1 - Progress: Progressing toward goals   Visit Information  Last OT Received On: 08/24/12 Assistance Needed: +1    Subjective Data      Prior Functioning  Home Living Lives With: Spouse Available Help at Discharge: Available 24 hours/day;Family Type of Home: House Home Access: Stairs to enter [employer] of Steps: 3 Entrance Stairs-Rails: Left Home Layout: One level Bathroom Shower/Tub: Health visitor: Standard Home Adaptive Equipment: Built-in shower seat;Bedside commode/3-in-1;Walker - four wheeled;Walker - standard;Walker - rolling Prior Function Level of Independence: Independent Able to Take Stairs?: Yes Driving: Yes Vocation: Retired Musician: No difficulties    Cognition  Overall Cognitive Status: Appears within functional limits for tasks assessed/performed Arousal/Alertness: Awake/alert Orientation Level: Appears intact for tasks assessed Behavior During Session: [facility] for tasks performed Cognition - Other Comments: Slightly impulsive; Pt attempting to get OOB unassisted.    Mobility  Shoulder Instructions Bed Mobility Bed Mobility: Not assessed (Pt  sitting EOB upon arrival) Transfers Transfers: Sit to Stand;Stand to Sit Sit to Stand: 4: Min assist;From bed;With upper extremity assist Stand to Sit: 4: Min assist;To toilet;With upper extremity assist Details for Transfer Assistance:  VC's for safest hand placement.       Exercises      Balance     End of Session OT - End of Session Equipment Utilized During Treatment: Gait belt;Back brace Activity Tolerance: Patient tolerated treatment well Patient left:  (on commode with call button in reach) Nurse Communication: Mobility status (pt in bathroom)  GO    08/24/2012 Cipriano Mile OTR/L Pager [phone] Office [phone]  Cipriano Mile 08/24/2012, 4:52 PM

## 2012-08-25 ENCOUNTER — Encounter (HOSPITAL_COMMUNITY): Payer: Self-pay | Admitting: Neurosurgery

## 2012-08-25 MED ORDER — METHOCARBAMOL 500 MG PO TABS
750.0000 mg | ORAL_TABLET | Freq: Four times a day (QID) | ORAL | Status: DC | PRN
  Administered 2012-08-26: 750 mg via ORAL
  Filled 2012-08-25: qty 2

## 2012-08-25 NOTE — Progress Notes (Signed)
Subjective: Patient reports "I feel some better. My back just feels like the muscles are really stretched."  Objective: Vital signs in last 24 hours: Temp:  [98.6 F (37 C)-101.6 F (38.7 C)] 99.8 F (37.7 C) (09/12 0543) Pulse Rate:  [77-87] 81  (09/12 0543) Resp:  [10-18] 18  (09/12 0543) BP: (86-150)/(64-76) 115/72 mmHg (09/12 0630) SpO2:  [93 %-100 %] 95 % (09/12 0543) FiO2 (%):  [99 %] 99 % (09/11 1727)  Intake/Output from previous day: 09/11 0701 - 09/12 0700 In: 1042.5 [P.O.:480; I.V.:562.5] Out: -  Intake/Output this shift: Total I/O In: 240 [P.O.:240] Out: -   Alert, conversant. Wife present. T101 last night, resolved with Tylenol - reassured. Incisions with Dermabond. No erythema, swelling, or drainage. Bowel movement this am. Good strength BLE. MAEW.   Lab Results: No results found for this basename: WBC:2,HGB:2,HCT:2,PLT:2 in the last 72 hours BMET No results found for this basename: NA:2,K:2,CL:2,CO2:2,GLUCOSE:2,BUN:2,CREATININE:2,CALCIUM:2 in the last 72 hours  Studies/Results: Dg Lumbar Spine Complete  08/23/2012  *RADIOLOGY REPORT*  Clinical Data: L3-4 XLIF.  L4-5 TLIF  LUMBAR SPINE - COMPLETE 4+ VIEW  Technique: Intraoperative fluoro spot images are provided.  Comparison:  MRI lumbar spine 07/07/2012.  Findings: The patient is status post discectomy and fusion at L3-4 and L4-5.  The disc heights have been restored.  Alignment is anatomic.  IMPRESSION: Status post discectomy and fixation at L3-4 and L4-5 without radiographic evidence for complication.   Original Report Authenticated By: Jamesetta Orleans. MATTERN, M.D.    Dg C-arm Gt 120 Min  08/23/2012  *RADIOLOGY REPORT*  Clinical Data: L3-4 XLIF.  L4-5 TLIF  LUMBAR SPINE - COMPLETE 4+ VIEW  Technique: Intraoperative fluoro spot images are provided.  Comparison:  MRI lumbar spine 07/07/2012.  Findings: The patient is status post discectomy and fusion at L3-4 and L4-5.  The disc heights have been restored.  Alignment  is anatomic.  IMPRESSION: Status post discectomy and fixation at L3-4 and L4-5 without radiographic evidence for complication.   Original Report Authenticated By: Jamesetta Orleans. MATTERN, M.D.     Assessment/Plan: Improving  LOS: 2 days  Per Dr. Venetia Maxon, d/c PCA, continue Percocet for pain, Tizanidine or Valium or Robaxin for spasms. Continue to mobilize in LSO with PT. Rolling walker for home. (Pt has elevated commode seat)   Lecil Tapp, Arlys John 08/25/2012, 8:09 AM

## 2012-08-25 NOTE — Progress Notes (Signed)
Patient ID: [name], male   DOB: 01-11-1951, 61 y.o.   MRN: [id number]  Napping, awakens to voice. Pain controlled with po meds. PT walked pt this am. Plans to walk more with PT later today. Pleased with progress.   Georgiann Cocker, RN, BSN

## 2012-08-25 NOTE — Care Management Note (Signed)
    Page 1 of 2   08/26/2012     11:23:15 AM   CARE MANAGEMENT NOTE 08/26/2012  Patient:  [name], [name]   Account Number:  [id number]  Date Initiated:  08/24/2012  Documentation initiated by:  [facility]  Subjective/Objective Assessment:   Admitted postop anterior lateral lumbar fusion 2 levels.Lives with spouse.     Action/Plan:   PT/OT evals-no follow needs, recommending rolling walker   Anticipated DC Date:  08/27/2012   Anticipated DC Plan:  HOME/SELF CARE      DC Planning Services  CM consult      Choice offered to / List presented to:     DME arranged  Levan Hurst      DME agency  [facility]        Status of service:  Completed, signed off Medicare Important Message given?   (If response is "NO", the following Medicare IM given date fields will be blank) Date Medicare IM given:   Date Additional Medicare IM given:    Discharge Disposition:  HOME/SELF CARE  Per UR Regulation:  Reviewed for med. necessity/level of care/duration of stay  If discussed at Long Length of Stay Meetings, dates discussed:    Comments:  08/26/12 Rolling walker was delivered to patient's room  by [facility] prior to d/c. Jacquelynn Cree RN, BSN, CCM   08/25/12 Rolling walker ordered,spoke with patient and spouse about a walker. They do not have a rolling walker and are agreeable to getting one.They already have an elevated toilet seat and do not know of any other equipment needs. Contacted Darian at [facility] and requested rolling walker be delivered to room prior to discharge,anticipating d/c 08/27/11. Will continue to follow for d/c needs. Jacquelynn Cree RN, BSN, CCM

## 2012-08-25 NOTE — Progress Notes (Signed)
Improving.  Continue PT today

## 2012-08-25 NOTE — Progress Notes (Signed)
I have read and agree with the below assessment and plan.   Jazmen Lindenbaum Helen Whitlow PT, DPT Pager: [phone] 

## 2012-08-25 NOTE — Progress Notes (Signed)
Clinical Social Worker received referral for new snf placement. Clinical Social Worker reviewed chart and noted PT/OT evaluations recommending no PT/OT follow up. Inappropriate Clinical Social Worker referral. Clinical Social Worker signing off. Please re-consult if social work needs arise.   Jacklynn Lewis, MSW, LCSWA  Clinical Social Work [phone]

## 2012-08-25 NOTE — Progress Notes (Signed)
Physical Therapy Treatment Patient Details Name: [name] MRN: [id number] DOB: 02-08-1951 Today's Date: 08/25/2012 Time: 1478-2956 PT Time Calculation (min): 23 min  PT Assessment / Plan / Recommendation Comments on Treatment Session  Pt requried increased verbal cueing for safe use of RW today; nursing reports PCA pump was just disconnected and will monitor pt. Pt improved with transfers this session but continues to require verbal cueing for safe hand placement.    Follow Up Recommendations  No PT follow up;Supervision for mobility/OOB    Barriers to Discharge        Equipment Recommendations  Rolling walker with 5" wheels    Recommendations for Other Services    Frequency Min 5X/week   Plan Discharge plan remains appropriate;Frequency remains appropriate    Precautions / Restrictions Precautions Precautions: Back Precaution Comments: Pt unable to recall back precautions. Educated on 3/3 back precautions. Required Braces or Orthoses: Spinal Brace Spinal Brace: Lumbar corset Restrictions Weight Bearing Restrictions: No       Mobility  Bed Mobility Bed Mobility: Sit to Supine;Scooting to HOB Sit to Supine: 5: Supervision Scooting to [facility]: 5: Supervision Details for Bed Mobility Assistance: Pt required minimal verbal cueing to maintain back precautions/log rolling and for sequencing for scooting HOB. Transfers Transfers: Sit to Stand;Stand to Sit Sit to Stand: 4: Min assist;From chair/3-in-1;With upper extremity assist Stand to Sit: 4: Min assist;To bed;With upper extremity assist Details for Transfer Assistance: Pt requried verbal cues for safe hand placement and facilitation at upper trunk for upright posture. Ambulation/Gait Ambulation/Gait Assistance: 4: Min guard Ambulation Distance (Feet): 200 Feet Assistive device: Rolling walker Ambulation/Gait Assistance Details: Pt required increased cueing for safe use of RW today and for sequencing. Gait Pattern:  Step-through pattern;Decreased stride length;Decreased trunk rotation;Trunk flexed Stairs: No      PT Goals Acute Rehab PT Goals PT Goal: Sit to Stand - Progress: Progressing toward goal PT Goal: Stand to Sit - Progress: Progressing toward goal PT Goal: Ambulate - Progress: Progressing toward goal Additional Goals PT Goal: Additional Goal #1 - Progress: Progressing toward goal  Visit Information  Last PT Received On: 08/25/12 Assistance Needed: +1    Subjective Data  Subjective: "My legs feel weak today.   Cognition  Overall Cognitive Status: Appears within functional limits for tasks assessed/performed Arousal/Alertness: Awake/alert Orientation Level: Appears intact for tasks assessed Behavior During Session: [facility] for tasks performed Cognition - Other Comments: Pt continues to be slightly impulsive and requires increased processing time.    Balance  Balance Balance Assessed: No  End of Session PT - End of Session Equipment Utilized During Treatment: Gait belt;Back brace Activity Tolerance: Patient limited by fatigue;Patient tolerated treatment well Patient left: in bed;with call bell/phone within reach;with family/visitor present Nurse Communication: Mobility status   GP     Joanna Borawski 08/25/2012, 11:03 AM

## 2012-08-26 NOTE — Discharge Summary (Signed)
Physician Discharge Summary  Patient ID: [name] MRN: [id number] DOB/AGE: Aug 15, 1951 61 y.o.  Admit date: 08/23/2012 Discharge date: 08/26/2012  Admission Diagnoses: Lumbar spondylosis, Lumbar stenosis, Lumbago, Lumbar radiculopathy L3/4, L4/5   Discharge Diagnoses: Lumbar spondylosis, Lumbar stenosis, Lumbago, Lumbar radiculopathy L3/4, L4/5 s/p Left Sided Lumbar three-four, Anterolateral Decompression/fusion PEEK Cage 8 mm L3/4 LUMBAR PERCUTANEOUS PEDICLE SCREW 2 LEVEL (N/A) - Lumbar three-four, four-five fusion MAS TLIF with PEEK cage and autograft  with percutaneous pedicle screws L3-L5 bilaterally  Active Problems:  * No active hospital problems. *    Discharged Condition: good  Hospital Course: [name] was admitted for surgery on 08-23-12 with dx of Lumbar spondylosis, Lumbar stenosis, Lumbago, Lumbar radiculopathy L3/4, L4/5. Following uncomplicated lumbar fusion, he recovered in Neuro PACU before transferring to [facility] for nursing and therapy care. He has progressed steadily.  Consults: None  Significant Diagnostic Studies: radiology: X-Ray: intra-operative  Treatments: surgery: Left Sided Lumbar three-four, Anterolateral Decompression/fusion PEEK Cage 8 mm L3/4 LUMBAR PERCUTANEOUS PEDICLE SCREW 2 LEVEL (N/A) - Lumbar three-four, four-five fusion MAS TLIF with PEEK cage and autograft  with percutaneous pedicle screws L3-L5 bilaterally   Discharge Exam: Blood pressure 132/70, pulse 92, temperature 98.4 F (36.9 C), temperature source Oral, resp. rate 18, height 6\' 2"  (1.88 m), weight 88 kg (194 lb 0.1 oz), SpO2 98.00%. Alert, conversant. Wife present. Incisions with Dermabond. No erythema, swelling, or drainage. Good strength BLE. Pain controlled with po meds.     Disposition: 01-Home or Self Care   Pt verbalizes understanding of d/c instructions. He will call office to schedule 3-4 wk f/u visit. Rx's given: Hydrocodone 10/325 1-2 po q4hrs prn pain #60; Robaxin 750mg   1po q6hrs prn spasm #60.         Medication List     As of 08/26/2012  9:00 AM    ASK your doctor about these medications         aspirin 81 MG EC tablet   Take 81 mg by mouth at bedtime.      celecoxib 200 MG capsule   Commonly known as: CELEBREX   Take 200 mg by mouth 2 (two) times daily.      clonazePAM 0.5 MG tablet   Commonly known as: KLONOPIN   Take 0.5-1.5 mg by mouth at bedtime. take 1 tablet at bedtime with Remeron, then take 3 tablet at bedtime * Alternates nights*      Co Q 10 100 MG Caps   Take 100 mg by mouth daily.      famotidine 20 MG tablet   Commonly known as: PEPCID   Take 20 mg by mouth daily.      fenofibrate 160 MG tablet   Take 1 tablet (160 mg total) by mouth daily.      finasteride 5 MG tablet   Commonly known as: PROSCAR   Take 5 mg by mouth daily.      gabapentin 100 MG capsule   Commonly known as: NEURONTIN   TAKE 1 CAPSULE THREE TIMES A DAY AS NEEDED HYPERSENSITIVITY RESOLVES      metoprolol succinate 25 MG 24 hr tablet   Commonly known as: TOPROL-XL   Take 1 tablet (25 mg total) by mouth daily.      nitroGLYCERIN 0.4 MG SL tablet   Commonly known as: NITROSTAT   Place 1 tablet (0.4 mg total) under the tongue every 5 (five) minutes as needed. for chest pain      OMEGA-3 KRILL OIL 300 MG  Caps   Take 300 mg by mouth daily.      pravastatin 80 MG tablet   Commonly known as: PRAVACHOL   Take 1 tablet (80 mg total) by mouth at bedtime.      ramipril 5 MG capsule   Commonly known as: ALTACE   Take 1 capsule (5 mg total) by mouth 2 (two) times daily.      REMERON 30 MG tablet   Generic drug: mirtazapine   Take 30 mg by mouth See admin instructions. Take 1 tablet every other night at bedtime with Klonopin 0.5 mg      STOOL SOFTENER PO   Take 1 capsule by mouth daily as needed. For stool softner      tiZANidine 4 MG tablet   Commonly known as: ZANAFLEX   Take 4 mg by mouth 2 (two) times daily as needed. For muscle spasms       traMADol 50 MG tablet   Commonly known as: ULTRAM   Take 100 mg by mouth every 8 (eight) hours as needed. for pain      Turmeric 500 MG Caps   Take 500 mg by mouth daily.      valACYclovir 500 MG tablet   Commonly known as: VALTREX   Take 500 mg by mouth 2 (two) times daily as needed. For flare up      VICODIN ES 7.5-750 MG per tablet   Generic drug: HYDROcodone-acetaminophen   Take 1 tablet by mouth every 8 (eight) hours as needed. For pain         Signed: Georgiann Cocker 08/26/2012, 9:00 AM

## 2012-08-26 NOTE — Discharge Summary (Signed)
Doing well  DC home

## 2012-08-26 NOTE — Progress Notes (Signed)
Wasted 15 ml of Morphine 1mg /ml  PCA in sink. Witnessed by Clare Gandy, RN.

## 2012-08-26 NOTE — Progress Notes (Signed)
Occupational Therapy Treatment Patient Details Name: [name] MRN: [id number] DOB: [birth year] Today's Date: 08/26/2012 Time: 0947-1000 OT Time Calculation (min): 13 min  OT Assessment / Plan / Recommendation Comments on Treatment Session Pt is progressing. Pt is likely going home this morning. If pt still here tomorrow, will continues with OT.    Follow Up Recommendations  No OT follow up;Supervision/Assistance - 24 hour    Barriers to Discharge       Equipment Recommendations  Rolling walker with 5" wheels    Recommendations for Other Services    Frequency Min 2X/week   Plan Discharge plan remains appropriate    Precautions / Restrictions Precautions Precautions: Back Precaution Comments: Pt able to independently recall 3/3 back precautions Required Braces or Orthoses: Spinal Brace Spinal Brace: Lumbar corset   Pertinent Vitals/Pain See vitals    ADL  Lower Body Bathing: Simulated;Supervision/safety Where Assessed - Lower Body Bathing: Unsupported sitting Lower Body Dressing: Performed;Supervision/safety Where Assessed - Lower Body Dressing: Unsupported sitting Toilet Transfer: Simulated;Supervision/safety Toilet Transfer Method: Sit to Barista:  (chair) Equipment Used: Back brace;Rolling walker Transfers/Ambulation Related to ADLs: supervision with RW ADL Comments: Session limited by time due to RN arriving to prepare pt for d/c.  Pt with some difficulty with LB dressing when raising ankle up but able to complete task with time.  Pt and wife able to independently verbalize back precautions and safe ADL techniques.     OT Diagnosis:    OT Problem List:   OT Treatment Interventions:     OT Goals Acute Rehab OT Goals OT Goal Formulation: With patient ADL Goals Pt Will Perform Lower Body Bathing: with modified independence;Sit to stand from chair;Sit to stand from bed ADL Goal: Lower Body Bathing - Progress: Progressing toward goals Pt  Will Perform Lower Body Dressing: with modified independence;Sit to stand from chair;Sit to stand from bed ADL Goal: Lower Body Dressing - Progress: Progressing toward goals Pt Will Transfer to Toilet: with modified independence;Ambulation;with DME;Comfort height toilet;Maintaining back safety precautions ADL Goal: Toilet Transfer - Progress: Progressing toward goals Miscellaneous OT Goals Miscellaneous OT Goal #1: Pt will independently verbalize and generalize 3/3 back precautions during all ADLs. OT Goal: Miscellaneous Goal #1 - Progress: Met  Visit Information  Last OT Received On: 08/26/12    Subjective Data      Prior Functioning       Cognition  Overall Cognitive Status: Appears within functional limits for tasks assessed/performed Arousal/Alertness: Awake/alert Orientation Level: Appears intact for tasks assessed Behavior During Session: [facility] for tasks performed    Mobility  Shoulder Instructions Bed Mobility Bed Mobility: Not assessed Transfers Sit to Stand: 5: Supervision;From chair/3-in-1 Stand to Sit: 5: Supervision;To chair/3-in-1       Exercises      Balance     End of Session OT - End of Session Equipment Utilized During Treatment: Back brace Activity Tolerance: Patient tolerated treatment well Patient left: in chair;with call bell/phone within reach;with nursing in room;with family/visitor present Nurse Communication: Mobility status  GO    08/26/2012 Cipriano Mile OTR/L Pager [phone] Office [phone]  Cipriano Mile 08/26/2012, 11:29 AM

## 2012-08-26 NOTE — Progress Notes (Signed)
Subjective: Patient reports "I'm doing ok, some back pain"  Objective: Vital signs in last 24 hours: Temp:  [98.4 F (36.9 C)-99.3 F (37.4 C)] 98.4 F (36.9 C) (09/13 1914) Pulse Rate:  [82-92] 92  (09/13 0637) Resp:  [18-20] 18  (09/13 0637) BP: (107-132)/(59-80) 132/70 mmHg (09/13 0637) SpO2:  [98 %-100 %] 98 % (09/13 0637)  Intake/Output from previous day: 09/12 0701 - 09/13 0700 In: 390 [P.O.:240; I.V.:150] Out: -  Intake/Output this shift:    Alert, conversant. Wife present. Incisions with Dermabond. No erythema, swelling, or drainage. Good strength BLE. Pain controlled with po meds.  Lab Results: No results found for this basename: WBC:2,HGB:2,HCT:2,PLT:2 in the last 72 hours BMET No results found for this basename: NA:2,K:2,CL:2,CO2:2,GLUCOSE:2,BUN:2,CREATININE:2,CALCIUM:2 in the last 72 hours  Studies/Results: No results found.  Assessment/Plan: Improved  LOS: 3 days  Per Dr. Venetia Maxon, d/c IV, d/c to home. Pt verbalizes understanding of d/c instructions. He will call office to schedule 3-4 wk f/u visit. Rx's given: Hydrocodone 10/325 1-2 po q4hrs prn pain #60; Robaxin 750mg  1po q6hrs prn spasm #60.    Georgiann Cocker 08/26/2012, 8:54 AM  Doing well.  D/C home

## 2012-09-30 ENCOUNTER — Ambulatory Visit (INDEPENDENT_AMBULATORY_CARE_PROVIDER_SITE_OTHER): Payer: Federal, State, Local not specified - PPO | Admitting: Psychology

## 2012-09-30 DIAGNOSIS — F4322 Adjustment disorder with anxiety: Secondary | ICD-10-CM

## 2012-10-05 ENCOUNTER — Other Ambulatory Visit: Payer: Self-pay | Admitting: *Deleted

## 2012-10-05 MED ORDER — NITROGLYCERIN 0.4 MG SL SUBL: 0.4000 mg | SUBLINGUAL_TABLET | SUBLINGUAL | Status: DC | PRN

## 2012-10-07 ENCOUNTER — Other Ambulatory Visit: Payer: Self-pay | Admitting: *Deleted

## 2012-10-13 ENCOUNTER — Other Ambulatory Visit: Payer: Self-pay | Admitting: *Deleted

## 2012-10-13 MED ORDER — NITROGLYCERIN 0.4 MG SL SUBL: 0.4000 mg | SUBLINGUAL_TABLET | SUBLINGUAL | Status: DC | PRN

## 2012-10-21 ENCOUNTER — Ambulatory Visit (INDEPENDENT_AMBULATORY_CARE_PROVIDER_SITE_OTHER): Payer: Federal, State, Local not specified - PPO | Admitting: Psychology

## 2012-10-21 DIAGNOSIS — F4322 Adjustment disorder with anxiety: Secondary | ICD-10-CM

## 2012-10-28 ENCOUNTER — Ambulatory Visit (INDEPENDENT_AMBULATORY_CARE_PROVIDER_SITE_OTHER): Payer: Federal, State, Local not specified - PPO | Admitting: Psychology

## 2012-10-28 DIAGNOSIS — F4322 Adjustment disorder with anxiety: Secondary | ICD-10-CM

## 2012-11-04 ENCOUNTER — Ambulatory Visit (INDEPENDENT_AMBULATORY_CARE_PROVIDER_SITE_OTHER): Payer: Federal, State, Local not specified - PPO | Admitting: Psychology

## 2012-11-04 DIAGNOSIS — F4322 Adjustment disorder with anxiety: Secondary | ICD-10-CM

## 2012-11-08 ENCOUNTER — Ambulatory Visit (INDEPENDENT_AMBULATORY_CARE_PROVIDER_SITE_OTHER): Payer: Federal, State, Local not specified - PPO | Admitting: Psychology

## 2012-11-08 DIAGNOSIS — F4322 Adjustment disorder with anxiety: Secondary | ICD-10-CM

## 2012-11-16 ENCOUNTER — Encounter (HOSPITAL_COMMUNITY): Payer: Self-pay | Admitting: Pharmacy Technician

## 2012-11-16 NOTE — H&P (Signed)
[name] DOB: 11-02-1951  Chief Complaint: left shoulder pain  History of Present Illness The patient is a 61 year old male who presents with shoulder complaints. They are ambidextrous and present today reporting pain at the left anterior shoulder and left posterior shoulder that began 6 months ago.. The activity involved repetitive shoulder motion. The onset of symptoms was gradual. The patient reports symptoms which include shoulder pain and inability to lay on that side. The patient reports symptoms that radiate to the left upper arm. The patient describes these symptoms as severe and worsening. Symptoms are exacerbated by motion at the shoulder, elevation of the shoulder and internal rotation of the shoulder. Symptoms are relieved by non-opioid analgesics. Current treatment includes opioid analgesics.  He states that his pain has gotten worse in the last 6 months. He states he does a lot of yard work. He noticed an injury to his left shoulder when he lifted a lawnmower. MRI revealed torn rotator cuff in the left shoulder.   Past Medical History Chronic Pain Coronary artery disease Hypertension Hypercholesterolemia Myocardial infarction Peripheral Neuropathy Prostate Disease Sleep Apnea  Allergies CeleXA *ANTIDEPRESSANTS* Penicillin G Benzathine & Proc *PENICILLINS* Doxycycline Hyclate *TETRACYCLINES* Isosorbide *CHEMICALS* Xifaxan *ANTI-INFECTIVE AGENTS - MISC.*   Family History Chronic Obstructive Lung Disease. mother Depression. mother Diabetes Mellitus. mother Drug / Alcohol Addiction. mother and brother Heart Disease. father and brother Heart disease in male family member before age 70 Hypertension. mother, father and brother Liver Disease, Chronic. mother   Social History Alcohol use. current drinker; drinks beer and wine; 5-7 per week Children. 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  married Tobacco use. never smoker   Medication History CeleBREX (200MG  Capsule, Oral) Active. ClonazePAM (0.5MG  Tablet, Oral) Active. Fenofibrate (160MG  Tablet, Oral) Active. Finasteride (5MG  Tablet, Oral) Active. Gabapentin (100MG  Capsule, Oral) Active. Hydrocodone-Acetaminophen (5-500MG  Tablet, Oral) Active. Metoprolol Succinate ER (25MG  Tablet ER 24HR, Oral) Active. Mirtazapine (45MG  Tablet, Oral) Active. Pravastatin Sodium (80MG  Tablet, Oral) Active. Ramipril (5MG  Capsule, Oral) Active. TiZANidine HCl (4MG  Capsule, Oral) Active. Nitrostat (0.4MG  Tab Sublingual, Sublingual) Active. Pepcid (20MG  Tablet, Oral) Active. Remeron (30MG  Tablet, Oral) Active. TraMADol HCl (50MG  Tablet, Oral) Active. Valtrex (500MG  Tablet, Oral) Active. Krill Oil (300MG  Capsule, Oral) Active. CoQ10 (100MG  Capsule, Oral) Active. Aspirin (81MG  Tablet, 1 (one) Oral) Active.   Past Surgical History Coronary Artery Bypass Graft. 4 or more vessels Cervical spine fusion Spinal Decompression. neck and lower back Vasectomy    Review of Systems General:Not Present- Chills, Fever, Night Sweats, Appetite Loss, Fatigue, Feeling sick, Weight Gain and Weight Loss. Skin:Not Present- Itching, Rash, Skin Color Changes, Ulcer, Psoriasis and Change in Hair or Nails. HEENT:Present- Ringing in the Ears. Not Present- Sensitivity to light, Nose Bleed, Visual Loss and Decreased Hearing. Neck:Not Present- Swollen Glands and Neck Mass. Respiratory:Not Present- Shortness of breath, Snoring, Chronic Cough and Bloody sputum. Cardiovascular:Not Present- Shortness of Breath, Chest Pain, Swelling of Extremities, Leg Cramps and Palpitations. Gastrointestinal:Not Present- Bloody Stool, Heartburn, Abdominal Pain, Vomiting, Nausea and Incontinence of Stool. Male Genitourinary:Present- Nocturia. Not Present- Blood in Urine, Frequency and Incontinence. Musculoskeletal:Present- Muscle Pain, Joint Stiffness, Joint Pain  and Back Pain. Not Present- Muscle Weakness and Joint Swelling. Neurological:Present- Tingling, Numbness, Burning and Headaches. Not Present- Tremor and Dizziness. Psychiatric:Not Present- Anxiety, Depression and Memory Loss. Endocrine:Not Present- Cold Intolerance, Heat Intolerance, Excessive hunger and Excessive Thirst. Hematology:Not Present- Abnormal Bleeding, Abnormal bruising, Anemia and Blood Clots.   Vitals Body Surface Area: 2.14 m Body Mass  Index: 24.32 kg/m Pulse: 57 (Regular) BP: 125/82 (Sitting, Left Arm, Standard)    Physical Exam On physical exam of his left shoulder, he has painful, limited motion in all ranges. He is extremely weak in abduction and shoulder flexion. There are no masses or tumors about the axilla or any supraclavicular nodes. His AC joint is well located. The shoulder is well located. Biceps and triceps are intact. Good function in his elbow and his hand.Auscultating his lungs, his lungs are clear. The heart is normal. No signs of murmur. The oral cavity is negative. His neck, there are no masses about the neck. No thyroid enlargement.    RADIOGRAPHS: X-rays of his left shoulder show no gross abnormalities.    Assessment & Plan MRI shows that he has significant impingement of the left rotator cuff with partial tears of the rotator cuff and tendonitis as well.He needs a left shoulder rotator cuff repair with possible use of graft and anchors. He will be placed in observation overnight for this procedure.    Dimitri Ped, PA-C

## 2012-11-18 ENCOUNTER — Encounter (HOSPITAL_COMMUNITY)
Admission: RE | Admit: 2012-11-18 | Discharge: 2012-11-18 | Disposition: A | Discharge status: Home / Self Care | Payer: Federal, State, Local not specified - PPO | Source: Ambulatory Visit | Attending: Orthopedic Surgery | Admitting: Orthopedic Surgery

## 2012-11-18 ENCOUNTER — Ambulatory Visit: Payer: Federal, State, Local not specified - PPO | Admitting: Psychology

## 2012-11-18 ENCOUNTER — Encounter (HOSPITAL_COMMUNITY): Payer: Self-pay

## 2012-11-18 HISTORY — DX: Dorsalgia, unspecified: M54.9

## 2012-11-18 HISTORY — DX: Angina pectoris, unspecified: I20.9

## 2012-11-18 HISTORY — DX: Myoneural disorder, unspecified: G70.9

## 2012-11-18 LAB — COMPREHENSIVE METABOLIC PANEL
ALT: 17 U/L (ref 0–53)
AST: 17 U/L (ref 0–37)
Albumin: 4.2 g/dL (ref 3.5–5.2)
Alkaline Phosphatase: 47 U/L (ref 39–117)
BUN: 18 mg/dL (ref 6–23)
CO2: 28 mEq/L (ref 19–32)
Calcium: 9.8 mg/dL (ref 8.4–10.5)
Chloride: 102 mEq/L (ref 96–112)
Creatinine, Ser: 1.42 mg/dL — ABNORMAL HIGH (ref 0.50–1.35)
GFR calc Af Amer: 61 mL/min — ABNORMAL LOW (ref >90)
GFR calc non Af Amer: 52 mL/min — ABNORMAL LOW (ref >90)
Glucose, Bld: 90 mg/dL (ref 70–99)
Potassium: 4.7 mEq/L (ref 3.5–5.1)
Sodium: 138 mEq/L (ref 135–145)
Total Bilirubin: 0.2 mg/dL — ABNORMAL LOW (ref 0.3–1.2)
Total Protein: 7.1 g/dL (ref 6.0–8.3)

## 2012-11-18 LAB — PROTIME-INR
INR: 0.98 (ref 0.00–1.49)
Prothrombin Time: 12.9 seconds (ref 11.6–15.2)

## 2012-11-18 LAB — CBC
HCT: 35.4 % — ABNORMAL LOW (ref 39.0–52.0)
Hemoglobin: 12.2 g/dL — ABNORMAL LOW (ref 13.0–17.0)
MCH: 30.4 pg (ref 26.0–34.0)
MCHC: 34.5 g/dL (ref 30.0–36.0)
MCV: 88.3 fL (ref 78.0–100.0)
Platelets: 169 10*3/uL (ref 150–400)
RBC: 4.01 MIL/uL — ABNORMAL LOW (ref 4.22–5.81)
RDW: 12.6 % (ref 11.5–15.5)
WBC: 4.1 10*3/uL (ref 4.0–10.5)

## 2012-11-18 LAB — URINALYSIS, ROUTINE W REFLEX MICROSCOPIC
Bilirubin Urine: NEGATIVE
Glucose, UA: NEGATIVE mg/dL
Hgb urine dipstick: NEGATIVE
Ketones, ur: NEGATIVE mg/dL
Leukocytes, UA: NEGATIVE
Nitrite: NEGATIVE
Protein, ur: NEGATIVE mg/dL
Specific Gravity, Urine: 1.005 (ref 1.005–1.030)
Urobilinogen, UA: 0.2 mg/dL (ref 0.0–1.0)
pH: 6 (ref 5.0–8.0)

## 2012-11-18 LAB — SURGICAL PCR SCREEN
MRSA, PCR: NEGATIVE
Staphylococcus aureus: NEGATIVE

## 2012-11-18 LAB — APTT: aPTT: 27 seconds (ref 24–37)

## 2012-11-18 NOTE — Pre-Procedure Instructions (Signed)
PREOP CBC, CMET, PT, PTT, UA WERE DONE TODAY AT [facility] AS PER ORDERS DR. Darrelyn Hillock.  PT HAS CXR REPORT IN EPIC FROM 06/21/12 AND HAS EKG AND CARDIOLOGY OFFICE NOTE IN EPIC FROM 06/21/12 DR. STUCKEY.  SLEEP STUDY REPORT ON CHART FROM [facility] -IT WAS DONE 04/29/12.  PT INSTRUCTED TO BRING CPAP MASK AND TUBING--HE STATES HE PLANS TO BRING HIS MACHINE BECAUSE HE HAS HAD PROBLEMS IN THE PAST WITH HOSPITAL MACHINES.

## 2012-11-18 NOTE — Patient Instructions (Signed)
YOUR SURGERY IS SCHEDULED AT [facility]  ON:  Wednesday  12 / 11  REPORT TO [facility] AT:  9:30 AM      PHONE # FOR SHORT STAY IS [phone]  DO NOT EAT OR DRINK ANYTHING AFTER MIDNIGHT THE NIGHT BEFORE YOUR SURGERY.  YOU MAY BRUSH YOUR TEETH, RINSE OUT YOUR MOUTH--BUT NO WATER, NO FOOD, NO CHEWING GUM, NO MINTS, NO CANDIES, NO CHEWING TOBACCO.  PLEASE TAKE THE FOLLOWING MEDICATIONS THE AM OF YOUR SURGERY WITH A FEW SIPS OF WATER:   PEPCID, FINASTERIDE, GABAPENTIN AND METOPROL  IF YOU USE INHALERS--USE YOUR INHALERS THE AM OF YOUR SURGERY AND BRING INHALERS TO THE HOSPITAL -TAKE TO SURGERY.    IF YOU ARE DIABETIC:  DO NOT TAKE ANY DIABETIC MEDICATIONS THE AM OF YOUR SURGERY.  IF YOU TAKE INSULIN IN THE EVENINGS--PLEASE ONLY TAKE 1/2 NORMAL EVENING DOSE THE NIGHT BEFORE YOUR SURGERY.  NO INSULIN THE AM OF YOUR SURGERY.  IF YOU HAVE SLEEP APNEA AND USE CPAP OR BIPAP--PLEASE BRING THE MASK AND THE TUBING.  DO NOT BRING YOUR MACHINE.  DO NOT BRING VALUABLES, MONEY, CREDIT CARDS.  DO NOT WEAR JEWELRY, MAKE-UP, NAIL POLISH AND NO METAL PINS OR CLIPS IN YOUR HAIR. CONTACT LENS, DENTURES / PARTIALS, GLASSES SHOULD NOT BE WORN TO SURGERY AND IN MOST CASES-HEARING AIDS WILL NEED TO BE REMOVED.  BRING YOUR GLASSES CASE, ANY EQUIPMENT NEEDED FOR YOUR CONTACT LENS. FOR PATIENTS ADMITTED TO THE HOSPITAL--CHECK OUT TIME THE DAY OF DISCHARGE IS 11:00 AM.  ALL INPATIENT ROOMS ARE PRIVATE - WITH BATHROOM, TELEPHONE, TELEVISION AND WIFI INTERNET.  IF YOU ARE BEING DISCHARGED THE SAME DAY OF YOUR SURGERY--YOU CAN NOT DRIVE YOURSELF HOME--AND SHOULD NOT GO HOME ALONE BY TAXI OR BUS.  NO DRIVING OR OPERATING MACHINERY FOR 24 HOURS FOLLOWING ANESTHESIA / PAIN MEDICATIONS.  PLEASE MAKE ARRANGEMENTS FOR SOMEONE TO BE WITH YOU AT HOME THE FIRST 24 HOURS AFTER SURGERY. RESPONSIBLE DRIVER'S NAME___________________________                                               PHONE #    _______________________                               PLEASE READ OVER ANY  FACT SHEETS THAT YOU WERE GIVEN: MRSA INFORMATION, BLOOD TRANSFUSION INFORMATION, INCENTIVE SPIROMETER INFORMATION. FAILURE TO FOLLOW THESE INSTRUCTIONS MAY RESULT IN THE CANCELLATION OF YOUR SURGERY.   PATIENT SIGNATURE_________________________________

## 2012-11-23 ENCOUNTER — Encounter (HOSPITAL_COMMUNITY): Payer: Self-pay | Admitting: *Deleted

## 2012-11-23 ENCOUNTER — Encounter (HOSPITAL_COMMUNITY): Payer: Self-pay | Admitting: Anesthesiology

## 2012-11-23 ENCOUNTER — Encounter (HOSPITAL_COMMUNITY)
Admission: RE | Disposition: A | Discharge status: Home / Self Care | Payer: Self-pay | Source: Ambulatory Visit | Attending: Orthopedic Surgery

## 2012-11-23 ENCOUNTER — Observation Stay (HOSPITAL_COMMUNITY)
Admission: RE | Admit: 2012-11-23 | Discharge: 2012-11-24 | Disposition: A | Discharge status: Home / Self Care | Payer: Federal, State, Local not specified - PPO | Source: Ambulatory Visit | Attending: Orthopedic Surgery | Admitting: Orthopedic Surgery

## 2012-11-23 ENCOUNTER — Ambulatory Visit (HOSPITAL_COMMUNITY): Payer: Federal, State, Local not specified - PPO | Admitting: Anesthesiology

## 2012-11-23 DIAGNOSIS — M25819 Other specified joint disorders, unspecified shoulder: Principal | ICD-10-CM | POA: Insufficient documentation

## 2012-11-23 DIAGNOSIS — M7540 Impingement syndrome of unspecified shoulder: Secondary | ICD-10-CM | POA: Diagnosis present

## 2012-11-23 DIAGNOSIS — I10 Essential (primary) hypertension: Secondary | ICD-10-CM | POA: Insufficient documentation

## 2012-11-23 DIAGNOSIS — Z79899 Other long term (current) drug therapy: Secondary | ICD-10-CM | POA: Insufficient documentation

## 2012-11-23 DIAGNOSIS — G4730 Sleep apnea, unspecified: Secondary | ICD-10-CM | POA: Insufficient documentation

## 2012-11-23 DIAGNOSIS — I2510 Atherosclerotic heart disease of native coronary artery without angina pectoris: Secondary | ICD-10-CM | POA: Insufficient documentation

## 2012-11-23 DIAGNOSIS — E7800 Pure hypercholesterolemia, unspecified: Secondary | ICD-10-CM | POA: Insufficient documentation

## 2012-11-23 DIAGNOSIS — Z951 Presence of aortocoronary bypass graft: Secondary | ICD-10-CM | POA: Insufficient documentation

## 2012-11-23 DIAGNOSIS — I252 Old myocardial infarction: Secondary | ICD-10-CM | POA: Insufficient documentation

## 2012-11-23 DIAGNOSIS — M19019 Primary osteoarthritis, unspecified shoulder: Secondary | ICD-10-CM | POA: Insufficient documentation

## 2012-11-23 HISTORY — PX: SHOULDER ACROMIOPLASTY: SHX6093

## 2012-11-23 SURGERY — SHOULDER ACROMIOPLASTY
Anesthesia: General | Site: Shoulder | Laterality: Left | Wound class: Clean

## 2012-11-23 MED ORDER — FENTANYL CITRATE 0.05 MG/ML IJ SOLN
INTRAMUSCULAR | Status: DC | PRN
  Administered 2012-11-23 (×2): 50 ug via INTRAVENOUS
  Administered 2012-11-23: 100 ug via INTRAVENOUS
  Administered 2012-11-23: 50 ug via INTRAVENOUS

## 2012-11-23 MED ORDER — RAMIPRIL 5 MG PO CAPS
5.0000 mg | ORAL_CAPSULE | Freq: Two times a day (BID) | ORAL | Status: DC
  Administered 2012-11-23 – 2012-11-24 (×2): 5 mg via ORAL
  Filled 2012-11-23 (×3): qty 1

## 2012-11-23 MED ORDER — BUPIVACAINE LIPOSOME 1.3 % IJ SUSP
20.0000 mL | Freq: Once | INTRAMUSCULAR | Status: AC
  Administered 2012-11-23: 20 mL
  Filled 2012-11-23: qty 20

## 2012-11-23 MED ORDER — CLONAZEPAM 1 MG PO TABS: 1.5000 mg | ORAL_TABLET | ORAL | Status: DC

## 2012-11-23 MED ORDER — HYDROCODONE-ACETAMINOPHEN 5-325 MG PO TABS
1.0000 | ORAL_TABLET | ORAL | Status: DC | PRN
  Administered 2012-11-24: 2 via ORAL
  Filled 2012-11-23: qty 2

## 2012-11-23 MED ORDER — EPHEDRINE SULFATE 50 MG/ML IJ SOLN
INTRAMUSCULAR | Status: DC | PRN
  Administered 2012-11-23 (×3): 10 mg via INTRAVENOUS

## 2012-11-23 MED ORDER — ACETAMINOPHEN 10 MG/ML IV SOLN
INTRAVENOUS | Status: DC | PRN
  Administered 2012-11-23: 1000 mg via INTRAVENOUS

## 2012-11-23 MED ORDER — PHENOL 1.4 % MT LIQD: 1.0000 | OROMUCOSAL | Status: DC | PRN

## 2012-11-23 MED ORDER — FENTANYL CITRATE 0.05 MG/ML IJ SOLN
INTRAMUSCULAR | Status: AC
  Filled 2012-11-23: qty 2

## 2012-11-23 MED ORDER — CLINDAMYCIN PHOSPHATE 900 MG/50ML IV SOLN
900.0000 mg | Freq: Four times a day (QID) | INTRAVENOUS | Status: AC
  Administered 2012-11-23 – 2012-11-24 (×3): 900 mg via INTRAVENOUS
  Filled 2012-11-23 (×3): qty 50

## 2012-11-23 MED ORDER — FLEET ENEMA 7-19 GM/118ML RE ENEM: 1.0000 | ENEMA | Freq: Once | RECTAL | Status: AC | PRN

## 2012-11-23 MED ORDER — CLONAZEPAM 1 MG PO TABS
1.5000 mg | ORAL_TABLET | ORAL | Status: DC
  Administered 2012-11-23: 1.5 mg via ORAL
  Filled 2012-11-23: qty 1

## 2012-11-23 MED ORDER — DIPHENHYDRAMINE HCL 50 MG/ML IJ SOLN: 12.5000 mg | Freq: Four times a day (QID) | INTRAMUSCULAR | Status: DC | PRN

## 2012-11-23 MED ORDER — MIRTAZAPINE 45 MG PO TABS
45.0000 mg | ORAL_TABLET | Freq: Every day | ORAL | Status: DC
  Filled 2012-11-23: qty 1

## 2012-11-23 MED ORDER — SODIUM CHLORIDE 0.9 % IR SOLN
Status: DC | PRN
  Administered 2012-11-23: 12:00:00

## 2012-11-23 MED ORDER — ONDANSETRON HCL 4 MG/2ML IJ SOLN
INTRAMUSCULAR | Status: DC | PRN
  Administered 2012-11-23: 4 mg via INTRAVENOUS

## 2012-11-23 MED ORDER — DEXAMETHASONE SODIUM PHOSPHATE 4 MG/ML IJ SOLN
INTRAMUSCULAR | Status: DC | PRN
  Administered 2012-11-23: 10 mg via INTRAVENOUS

## 2012-11-23 MED ORDER — OXYCODONE-ACETAMINOPHEN 5-325 MG PO TABS: 1.0000 | ORAL_TABLET | ORAL | Status: DC | PRN

## 2012-11-23 MED ORDER — NALOXONE HCL 0.4 MG/ML IJ SOLN: 0.4000 mg | INTRAMUSCULAR | Status: DC | PRN

## 2012-11-23 MED ORDER — ASPIRIN 81 MG PO CHEW
81.0000 mg | CHEWABLE_TABLET | Freq: Every day | ORAL | Status: DC
  Administered 2012-11-23: 81 mg via ORAL
  Filled 2012-11-23 (×2): qty 1

## 2012-11-23 MED ORDER — ACETAMINOPHEN 10 MG/ML IV SOLN
INTRAVENOUS | Status: AC
  Filled 2012-11-23: qty 100

## 2012-11-23 MED ORDER — ONDANSETRON HCL 4 MG PO TABS: 4.0000 mg | ORAL_TABLET | Freq: Four times a day (QID) | ORAL | Status: DC | PRN

## 2012-11-23 MED ORDER — FENTANYL CITRATE 0.05 MG/ML IJ SOLN
25.0000 ug | INTRAMUSCULAR | Status: DC | PRN
  Administered 2012-11-23 (×3): 50 ug via INTRAVENOUS

## 2012-11-23 MED ORDER — MORPHINE SULFATE (PF) 1 MG/ML IV SOLN
INTRAVENOUS | Status: DC
  Administered 2012-11-23: 26.07 mg via INTRAVENOUS
  Administered 2012-11-23: 10.5 mg via INTRAVENOUS
  Administered 2012-11-23: 1 mg via INTRAVENOUS
  Administered 2012-11-23: 22:00:00 via INTRAVENOUS
  Administered 2012-11-24: 24 mg via INTRAVENOUS
  Administered 2012-11-24: 16.5 mg via INTRAVENOUS
  Administered 2012-11-24: 04:00:00 via INTRAVENOUS
  Filled 2012-11-23 (×3): qty 25

## 2012-11-23 MED ORDER — METOPROLOL SUCCINATE ER 25 MG PO TB24
25.0000 mg | ORAL_TABLET | Freq: Every morning | ORAL | Status: DC
  Administered 2012-11-24: 25 mg via ORAL
  Filled 2012-11-23: qty 1

## 2012-11-23 MED ORDER — MORPHINE SULFATE 10 MG/ML IJ SOLN
INTRAMUSCULAR | Status: AC
  Filled 2012-11-23: qty 1

## 2012-11-23 MED ORDER — DIPHENHYDRAMINE HCL 12.5 MG/5ML PO ELIX: 12.5000 mg | ORAL_SOLUTION | Freq: Four times a day (QID) | ORAL | Status: DC | PRN

## 2012-11-23 MED ORDER — SODIUM CHLORIDE 0.9 % IJ SOLN: 9.0000 mL | INTRAMUSCULAR | Status: DC | PRN

## 2012-11-23 MED ORDER — MENTHOL 3 MG MT LOZG: 1.0000 | LOZENGE | OROMUCOSAL | Status: DC | PRN

## 2012-11-23 MED ORDER — CELECOXIB 200 MG PO CAPS
200.0000 mg | ORAL_CAPSULE | Freq: Two times a day (BID) | ORAL | Status: DC
  Administered 2012-11-24: 200 mg via ORAL
  Filled 2012-11-23 (×2): qty 1

## 2012-11-23 MED ORDER — BISACODYL 10 MG RE SUPP: 10.0000 mg | Freq: Every day | RECTAL | Status: DC | PRN

## 2012-11-23 MED ORDER — MEPERIDINE HCL 50 MG/ML IJ SOLN: 6.2500 mg | INTRAMUSCULAR | Status: DC | PRN

## 2012-11-23 MED ORDER — BACITRACIN-NEOMYCIN-POLYMYXIN 400-5-5000 EX OINT
TOPICAL_OINTMENT | CUTANEOUS | Status: AC
  Filled 2012-11-23: qty 1

## 2012-11-23 MED ORDER — POLYETHYLENE GLYCOL 3350 17 G PO PACK: 17.0000 g | PACK | Freq: Every day | ORAL | Status: DC | PRN

## 2012-11-23 MED ORDER — ACETAMINOPHEN 650 MG RE SUPP: 650.0000 mg | Freq: Four times a day (QID) | RECTAL | Status: DC | PRN

## 2012-11-23 MED ORDER — CLONAZEPAM 0.5 MG PO TABS: 0.5000 mg | ORAL_TABLET | ORAL | Status: DC

## 2012-11-23 MED ORDER — CLINDAMYCIN PHOSPHATE 900 MG/50ML IV SOLN
INTRAVENOUS | Status: AC
  Filled 2012-11-23: qty 50

## 2012-11-23 MED ORDER — LACTATED RINGERS IV SOLN
INTRAVENOUS | Status: DC
  Administered 2012-11-23: 22:00:00 via INTRAVENOUS

## 2012-11-23 MED ORDER — NITROGLYCERIN 0.4 MG SL SUBL: 0.4000 mg | SUBLINGUAL_TABLET | SUBLINGUAL | Status: DC | PRN

## 2012-11-23 MED ORDER — MORPHINE SULFATE 2 MG/ML IJ SOLN: 1.0000 mg | INTRAMUSCULAR | Status: DC | PRN

## 2012-11-23 MED ORDER — MIRTAZAPINE 30 MG PO TABS: 30.0000 mg | ORAL_TABLET | ORAL | Status: DC

## 2012-11-23 MED ORDER — ONDANSETRON HCL 4 MG/2ML IJ SOLN: 4.0000 mg | Freq: Four times a day (QID) | INTRAMUSCULAR | Status: DC | PRN

## 2012-11-23 MED ORDER — THROMBIN 5000 UNITS EX SOLR
CUTANEOUS | Status: DC | PRN
  Administered 2012-11-23: 5000 [IU] via TOPICAL

## 2012-11-23 MED ORDER — MORPHINE SULFATE 10 MG/ML IJ SOLN
1.0000 mg | INTRAMUSCULAR | Status: DC | PRN
  Administered 2012-11-23 (×3): 2 mg via INTRAVENOUS

## 2012-11-23 MED ORDER — METHOCARBAMOL 500 MG PO TABS: 500.0000 mg | ORAL_TABLET | Freq: Four times a day (QID) | ORAL | Status: DC | PRN

## 2012-11-23 MED ORDER — FAMOTIDINE 20 MG PO TABS
20.0000 mg | ORAL_TABLET | Freq: Every day | ORAL | Status: DC
  Administered 2012-11-23 – 2012-11-24 (×2): 20 mg via ORAL
  Filled 2012-11-23 (×2): qty 1

## 2012-11-23 MED ORDER — SUCCINYLCHOLINE CHLORIDE 20 MG/ML IJ SOLN
INTRAMUSCULAR | Status: DC | PRN
  Administered 2012-11-23: 100 mg via INTRAVENOUS

## 2012-11-23 MED ORDER — LACTATED RINGERS IV SOLN
INTRAVENOUS | Status: DC
  Administered 2012-11-23 (×2): 1000 mL via INTRAVENOUS

## 2012-11-23 MED ORDER — FINASTERIDE 5 MG PO TABS
5.0000 mg | ORAL_TABLET | ORAL | Status: DC
  Filled 2012-11-23 (×2): qty 1

## 2012-11-23 MED ORDER — MIDAZOLAM HCL 5 MG/5ML IJ SOLN
INTRAMUSCULAR | Status: DC | PRN
  Administered 2012-11-23: 2 mg via INTRAVENOUS

## 2012-11-23 MED ORDER — PROPOFOL 10 MG/ML IV BOLUS
INTRAVENOUS | Status: DC | PRN
  Administered 2012-11-23: 180 mg via INTRAVENOUS

## 2012-11-23 MED ORDER — MIRTAZAPINE 45 MG PO TABS
45.0000 mg | ORAL_TABLET | ORAL | Status: DC
  Filled 2012-11-23: qty 1

## 2012-11-23 MED ORDER — METHOCARBAMOL 100 MG/ML IJ SOLN
500.0000 mg | Freq: Four times a day (QID) | INTRAVENOUS | Status: DC | PRN
  Administered 2012-11-23: 500 mg via INTRAVENOUS
  Filled 2012-11-23 (×2): qty 5

## 2012-11-23 MED ORDER — THROMBIN 5000 UNITS EX SOLR
CUTANEOUS | Status: AC
  Filled 2012-11-23: qty 5000

## 2012-11-23 MED ORDER — GABAPENTIN 100 MG PO CAPS
100.0000 mg | ORAL_CAPSULE | Freq: Three times a day (TID) | ORAL | Status: DC
  Administered 2012-11-23 – 2012-11-24 (×3): 100 mg via ORAL
  Filled 2012-11-23 (×5): qty 1

## 2012-11-23 MED ORDER — TURMERIC 500 MG PO CAPS: 500.0000 mg | ORAL_CAPSULE | Freq: Every day | ORAL | Status: DC

## 2012-11-23 MED ORDER — PROMETHAZINE HCL 25 MG/ML IJ SOLN: 6.2500 mg | INTRAMUSCULAR | Status: DC | PRN

## 2012-11-23 MED ORDER — ACETAMINOPHEN 325 MG PO TABS: 650.0000 mg | ORAL_TABLET | Freq: Four times a day (QID) | ORAL | Status: DC | PRN

## 2012-11-23 MED ORDER — CLINDAMYCIN PHOSPHATE 900 MG/50ML IV SOLN
900.0000 mg | INTRAVENOUS | Status: AC
  Administered 2012-11-23: 900 mg via INTRAVENOUS

## 2012-11-23 MED ORDER — LIDOCAINE HCL (CARDIAC) 20 MG/ML IV SOLN
INTRAVENOUS | Status: DC | PRN
  Administered 2012-11-23: 100 mg via INTRAVENOUS

## 2012-11-23 MED ORDER — SIMVASTATIN 5 MG PO TABS
5.0000 mg | ORAL_TABLET | Freq: Every day | ORAL | Status: DC
  Administered 2012-11-23: 5 mg via ORAL
  Filled 2012-11-23 (×2): qty 1

## 2012-11-23 MED ORDER — MORPHINE SULFATE (PF) 1 MG/ML IV SOLN
INTRAVENOUS | Status: AC
  Filled 2012-11-23: qty 25

## 2012-11-23 MED ORDER — ACETAMINOPHEN 10 MG/ML IV SOLN: 1000.0000 mg | Freq: Once | INTRAVENOUS | Status: DC | PRN

## 2012-11-23 MED ORDER — ONDANSETRON HCL 4 MG/2ML IJ SOLN
4.0000 mg | Freq: Four times a day (QID) | INTRAMUSCULAR | Status: DC | PRN
  Administered 2012-11-23: 4 mg via INTRAVENOUS
  Filled 2012-11-23: qty 2

## 2012-11-23 MED ORDER — FENOFIBRATE 160 MG PO TABS
160.0000 mg | ORAL_TABLET | Freq: Every day | ORAL | Status: DC
Start: 2012-11-23 — End: 2012-11-24
  Administered 2012-11-23: 160 mg via ORAL
  Filled 2012-11-23 (×2): qty 1

## 2012-11-23 SURGICAL SUPPLY — 51 items
BAG SPEC THK2 15X12 ZIP CLS (MISCELLANEOUS) ×1
BAG ZIPLOCK 12X15 (MISCELLANEOUS) ×2 IMPLANT
BLADE OSCILLATING/SAGITTAL (BLADE) ×2
BLADE SW THK.38XMED LNG THN (BLADE) ×1 IMPLANT
BNDG COHESIVE 6X5 TAN NS LF (GAUZE/BANDAGES/DRESSINGS) IMPLANT
BUR OVAL CARBIDE 4.0 (BURR) ×2 IMPLANT
CLEANER TIP ELECTROSURG 2X2 (MISCELLANEOUS) ×2 IMPLANT
CLOTH BEACON ORANGE TIMEOUT ST (SAFETY) ×2 IMPLANT
CLSR STERI-STRIP ANTIMIC 1/2X4 (GAUZE/BANDAGES/DRESSINGS) ×1 IMPLANT
DRAPE POUCH INSTRU U-SHP 10X18 (DRAPES) ×2 IMPLANT
DRSG ADAPTIC 3X8 NADH LF (GAUZE/BANDAGES/DRESSINGS) ×1 IMPLANT
DRSG EMULSION OIL 3X3 NADH (GAUZE/BANDAGES/DRESSINGS) ×2 IMPLANT
DRSG PAD ABDOMINAL 8X10 ST (GAUZE/BANDAGES/DRESSINGS) ×3 IMPLANT
DURAPREP 26ML APPLICATOR (WOUND CARE) ×2 IMPLANT
ELECT REM PT RETURN 9FT ADLT (ELECTROSURGICAL) ×2
ELECTRODE REM PT RTRN 9FT ADLT (ELECTROSURGICAL) ×1 IMPLANT
FLOSEAL 10ML (HEMOSTASIS) IMPLANT
GLOVE BIOGEL PI IND STRL 8 (GLOVE) ×1 IMPLANT
GLOVE BIOGEL PI IND STRL 8.5 (GLOVE) ×1 IMPLANT
GLOVE BIOGEL PI INDICATOR 8 (GLOVE) ×1
GLOVE BIOGEL PI INDICATOR 8.5 (GLOVE) ×1
GLOVE ECLIPSE 8.0 STRL XLNG CF (GLOVE) ×4 IMPLANT
GLOVE SURG SS PI 6.5 STRL IVOR (GLOVE) ×4 IMPLANT
GOWN PREVENTION PLUS LG XLONG (DISPOSABLE) ×4 IMPLANT
GOWN STRL REIN XL XLG (GOWN DISPOSABLE) ×4 IMPLANT
KIT BASIN OR (CUSTOM PROCEDURE TRAY) ×2 IMPLANT
MANIFOLD NEPTUNE II (INSTRUMENTS) ×2 IMPLANT
NDL MA TROC 1/2 (NEEDLE) IMPLANT
NEEDLE MA TROC 1/2 (NEEDLE) IMPLANT
NS IRRIG 1000ML POUR BTL (IV SOLUTION) IMPLANT
PACK SHOULDER CUSTOM OPM052 (CUSTOM PROCEDURE TRAY) ×2 IMPLANT
PASSER SUT SWANSON 36MM LOOP (INSTRUMENTS) IMPLANT
POSITIONER SURGICAL ARM (MISCELLANEOUS) ×2 IMPLANT
SLING ARM IMMOBILIZER LRG (SOFTGOODS) ×2 IMPLANT
SPONGE GAUZE 4X4 12PLY (GAUZE/BANDAGES/DRESSINGS) ×1 IMPLANT
SPONGE LAP 4X18 X RAY DECT (DISPOSABLE) ×1 IMPLANT
SPONGE SURGIFOAM ABS GEL 100 (HEMOSTASIS) ×1 IMPLANT
STAPLER VISISTAT 35W (STAPLE) ×2 IMPLANT
STRIP CLOSURE SKIN 1/2X4 (GAUZE/BANDAGES/DRESSINGS) ×2 IMPLANT
SUCTION FRAZIER 12FR DISP (SUCTIONS) ×2 IMPLANT
SUT BONE WAX W31G (SUTURE) ×2 IMPLANT
SUT ETHIBOND NAB CT1 #1 30IN (SUTURE) ×2 IMPLANT
SUT MNCRL AB 4-0 PS2 18 (SUTURE) ×2 IMPLANT
SUT VIC AB 0 CT1 27 (SUTURE) ×2
SUT VIC AB 0 CT1 27XBRD ANTBC (SUTURE) ×1 IMPLANT
SUT VIC AB 1 CT1 27 (SUTURE) ×4
SUT VIC AB 1 CT1 27XBRD ANTBC (SUTURE) ×2 IMPLANT
SUT VIC AB 2-0 CT1 27 (SUTURE) ×4
SUT VIC AB 2-0 CT1 27XBRD (SUTURE) IMPLANT
TAPE CLOTH SURG 6X10 WHT LF (GAUZE/BANDAGES/DRESSINGS) ×1 IMPLANT
TOWEL OR 17X26 10 PK STRL BLUE (TOWEL DISPOSABLE) ×4 IMPLANT

## 2012-11-23 NOTE — Progress Notes (Signed)
PACU  NOTE:  Dr Darrelyn Hillock notified pt with out pain relief despite Fentanyl and Morphine IV pushes. Pt requests Morphine PCA.  Orders received.

## 2012-11-23 NOTE — Progress Notes (Signed)
Dr Renold Don at bedside.  Aware pt without pain relief after Fentanyl and 2 mg Morphine.  Ordered to increase frequency of MorphineIV.  Pt sats 100%, pt awake and laert.

## 2012-11-23 NOTE — Interval H&P Note (Signed)
History and Physical Interval Note:  11/23/2012 10:54 AM  [name]  has presented today for surgery, with the diagnosis of LEFT SHOULDER ROTATOR CUFF TEAR  The various methods of treatment have been discussed with the patient and family. After consideration of risks, benefits and other options for treatment, the patient has consented to  Procedure(s) (LRB) with comments: ROTATOR CUFF REPAIR SHOULDER OPEN (Left) - LEFT SHOULDER ROTATOR CUFF REPAIR WITH POSS GRAFT AND ANCHORS as a surgical intervention .  The patient's history has been reviewed, patient examined, no change in status, stable for surgery.  I have reviewed the patient's chart and labs.  Questions were answered to the patient's satisfaction.     [name]

## 2012-11-23 NOTE — Anesthesia Procedure Notes (Signed)
Procedure Name: Intubation Date/Time: 11/23/2012 11:27 AM Performed by: Maris Berger T Pre-anesthesia Checklist: Patient identified, Emergency Drugs available, Suction available and Patient being monitored Patient Re-evaluated:Patient Re-evaluated prior to inductionOxygen Delivery Method: Circle System Utilized Preoxygenation: Pre-oxygenation with 100% oxygen Intubation Type: IV induction Ventilation: Mask ventilation without difficulty Laryngoscope Size: Mac and 4 Grade View: Grade II Tube type: Oral Number of attempts: 1 Airway Equipment and Method: stylet and oral airway Placement Confirmation: ETT inserted through vocal cords under direct vision,  positive ETCO2 and breath sounds checked- equal and bilateral Secured at: 23 cm Tube secured with: Tape Dental Injury: Teeth and Oropharynx as per pre-operative assessment  Comments: Head and neck kept in neutral position during intubation, easy intubation

## 2012-11-23 NOTE — Transfer of Care (Signed)
Immediate Anesthesia Transfer of Care Note  Patient: [name]  Procedure(s) Performed: Procedure(s) (LRB) with comments: SHOULDER ACROMIOPLASTY (Left) - exploration of rotator cuff, resection distal clavicle, debridement of AC joint  Patient Location: PACU  Anesthesia Type:General  Level of Consciousness: awake, alert  and oriented  Airway & Oxygen Therapy: Patient Spontanous Breathing and Patient connected to face mask oxygen  Post-op Assessment: Report given to PACU RN  Post vital signs: Reviewed and stable  Complications: No apparent anesthesia complications

## 2012-11-23 NOTE — Anesthesia Postprocedure Evaluation (Signed)
Anesthesia Post Note  Patient: [name]  Procedure(s) Performed: Procedure(s) (LRB): SHOULDER ACROMIOPLASTY (Left)  Anesthesia type: General  Patient location: PACU  Post pain: Pain level controlled  Post assessment: Post-op Vital signs reviewed  Last Vitals: BP 166/88  Pulse 65  Temp 36.4 C (Oral)  Resp 12  SpO2 98%  Post vital signs: Reviewed  Level of consciousness: sedated  Complications: No apparent anesthesia complications

## 2012-11-23 NOTE — Anesthesia Preprocedure Evaluation (Addendum)
Anesthesia Evaluation  Patient identified by MRN, date of birth, ID band Patient awake    Reviewed: Allergy & Precautions, H&P , NPO status , Patient's Chart, lab work & pertinent test results, reviewed documented beta blocker date and time   Airway Mallampati: II  Neck ROM: full    Dental  (+) Dental Advisory Given and Teeth Intact   Pulmonary sleep apnea and Continuous Positive Airway Pressure Ventilation , former smoker,  breath sounds clear to auscultation  Pulmonary exam normal       Cardiovascular hypertension, Pt. on medications and Pt. on home beta blockers + angina with exertion + CAD, + Past MI, + CABG and + Peripheral Vascular Disease Rhythm:Regular Rate:Normal  Cath  05/2010 ef = 45%, no change in CAD from 2007  Anginal sx very rare since retirement.   Neuro/Psych  Headaches, PSYCHIATRIC DISORDERS Anxiety    GI/Hepatic Neg liver ROS, GERD-  Medicated,  Endo/Other  negative endocrine ROS  Renal/GU negative Renal ROS     Musculoskeletal negative musculoskeletal ROS (+)   Abdominal   Peds  Hematology negative hematology ROS (+)   Anesthesia Other Findings   Reproductive/Obstetrics                        Anesthesia Physical  Anesthesia Plan  ASA: III  Anesthesia Plan: General   Post-op Pain Management:    Induction: Intravenous  Airway Management Planned: Oral ETT  Additional Equipment:   Intra-op Plan:   Post-operative Plan: Extubation in OR  Informed Consent: I have reviewed the patients History and Physical, chart, labs and discussed the procedure including the risks, benefits and alternatives for the proposed anesthesia with the patient or authorized representative who has indicated his/her understanding and acceptance.   Dental advisory given  Plan Discussed with: CRNA  Anesthesia Plan Comments:         Anesthesia Quick Evaluation                                   Anesthesia Evaluation  Patient identified by MRN, date of birth, ID band Patient awake    Reviewed: Allergy & Precautions, H&P , NPO status , Patient's Chart, lab work & pertinent test results  Airway Mallampati: II  Neck ROM: full    Dental   Pulmonary sleep apnea ,          Cardiovascular hypertension, + CAD, + Past MI and + CABG  Cath  05/2010 ef = 45%, no change in CAD from 2007   Neuro/Psych  Headaches, Anxiety    GI/Hepatic GERD-  ,  Endo/Other    Renal/GU      Musculoskeletal   Abdominal   Peds  Hematology   Anesthesia Other Findings   Reproductive/Obstetrics                          Anesthesia Physical Anesthesia Plan  ASA: III  Anesthesia Plan: General   Post-op Pain Management:    Induction: Intravenous  Airway Management Planned: Oral ETT  Additional Equipment:   Intra-op Plan:   Post-operative Plan: Extubation in OR  Informed Consent: I have reviewed the patients History and Physical, chart, labs and discussed the procedure including the risks, benefits and alternatives for the proposed anesthesia with the patient or authorized representative who has indicated his/her understanding and acceptance.     Plan Discussed  with: CRNA and Surgeon  Anesthesia Plan Comments:         Anesthesia Quick Evaluation                                   Anesthesia Evaluation  Patient identified by MRN, date of birth, ID band Patient awake    Reviewed: Allergy & Precautions, H&P , NPO status , Patient's Chart, lab work & pertinent test results  Airway Mallampati: II  Neck ROM: full    Dental   Pulmonary sleep apnea ,          Cardiovascular hypertension, + CAD, + Past MI and + CABG  Cath  05/2010 ef = 45%, no change in CAD from 2007   Neuro/Psych  Headaches, Anxiety    GI/Hepatic GERD-  ,  Endo/Other    Renal/GU      Musculoskeletal   Abdominal   Peds  Hematology   Anesthesia  Other Findings   Reproductive/Obstetrics                          Anesthesia Physical Anesthesia Plan  ASA: III  Anesthesia Plan: General   Post-op Pain Management:    Induction: Intravenous  Airway Management Planned: Oral ETT  Additional Equipment:   Intra-op Plan:   Post-operative Plan: Extubation in OR  Informed Consent: I have reviewed the patients History and Physical, chart, labs and discussed the procedure including the risks, benefits and alternatives for the proposed anesthesia with the patient or authorized representative who has indicated his/her understanding and acceptance.     Plan Discussed with: CRNA and Surgeon  Anesthesia Plan Comments:         Anesthesia Quick Evaluation                                   Anesthesia Evaluation  Patient identified by MRN, date of birth, ID band Patient awake    Reviewed: Allergy & Precautions, H&P , NPO status , Patient's Chart, lab work & pertinent test results  Airway Mallampati: II  Neck ROM: full    Dental   Pulmonary sleep apnea ,          Cardiovascular hypertension, + CAD, + Past MI and + CABG  Cath  05/2010 ef = 45%, no change in CAD from 2007   Neuro/Psych  Headaches, Anxiety    GI/Hepatic GERD-  ,  Endo/Other    Renal/GU      Musculoskeletal   Abdominal   Peds  Hematology   Anesthesia Other Findings   Reproductive/Obstetrics                          Anesthesia Physical Anesthesia Plan  ASA: III  Anesthesia Plan: General   Post-op Pain Management:    Induction: Intravenous  Airway Management Planned: Oral ETT  Additional Equipment:   Intra-op Plan:   Post-operative Plan: Extubation in OR  Informed Consent: I have reviewed the patients History and Physical, chart, labs and discussed the procedure including the risks, benefits and alternatives for the proposed anesthesia with the patient or authorized  representative who has indicated his/her understanding and acceptance.     Plan Discussed with: CRNA and Surgeon  Anesthesia Plan Comments:         Anesthesia Quick  Evaluation

## 2012-11-23 NOTE — Brief Op Note (Signed)
11/23/2012  12:25 PM  PATIENT:  [name]  61 y.o. 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  SURGEON:  Surgeon(s) and Role:    * Jacki Cones, MD - Primary  PHYSICIAN ASSISTANT: Dimitri Ped PA  ASSISTANTS: Dimitri Ped PA  ANESTHESIA:   general  EBL:  Total I/O In: 100 [I.V.:100] Out: -   BLOOD ADMINISTERED:none  DRAINS: none   LOCAL MEDICATIONS USED:  BUPIVICAINE 20cc  SPECIMEN:  No Specimen  DISPOSITION OF SPECIMEN:  N/A  COUNTS:  YES  TOURNIQUET:  * No tourniquets in log *  DICTATION: .Other Dictation: Dictation Number [id number]  PLAN OF CARE: Admit for overnight observation  PATIENT DISPOSITION:  Admit Overnight   Delay start of Pharmacological VTE agent (>24hrs) due to surgical blood loss or risk of bleeding: yes

## 2012-11-24 ENCOUNTER — Encounter (HOSPITAL_COMMUNITY): Payer: Self-pay | Admitting: Orthopedic Surgery

## 2012-11-24 MED ORDER — HYDROCODONE-ACETAMINOPHEN 7.5-750 MG PO TABS: 1.0000 | ORAL_TABLET | Freq: Four times a day (QID) | ORAL | Status: DC | PRN

## 2012-11-24 NOTE — Progress Notes (Signed)
Subjective: 1 Day Post-Op Procedure(s) (LRB): SHOULDER ACROMIOPLASTY (Left) Patient reports pain as 3 on 0-10 scale. Doing very well today.   Objective: Vital signs in last 24 hours: Temp:  [97.5 F (36.4 C)-98.7 F (37.1 C)] 98.7 F (37.1 C) (12/12 0517) Pulse Rate:  [54-78] 74  (12/12 0517) Resp:  [10-20] 16  (12/12 0517) BP: (123-166)/(74-95) 123/74 mmHg (12/12 0517) SpO2:  [94 %-100 %] 96 % (12/12 0517) FiO2 (%):  [97 %] 97 % (12/11 1656) Weight:  [89.812 kg (198 lb)] 89.812 kg (198 lb) (12/11 1540)  Intake/Output from previous day: 12/11 0701 - 12/12 0700 In: 3670.7 [P.O.:1164; I.V.:2351.7; IV Piggyback:155] Out: 2100 [Urine:2100] Intake/Output this shift:    No results found for this basename: HGB:5 in the last 72 hours No results found for this basename: WBC:2,RBC:2,HCT:2,PLT:2 in the last 72 hours No results found for this basename: NA:2,K:2,CL:2,CO2:2,BUN:2,CREATININE:2,GLUCOSE:2,CALCIUM:2 in the last 72 hours No results found for this basename: LABPT:2,INR:2 in the last 72 hours  Neurovascular intact  Assessment/Plan: 1 Day Post-Op Procedure(s) (LRB): SHOULDER ACROMIOPLASTY (Left)   [name] A 11/24/2012, 7:17 AM

## 2012-11-24 NOTE — Progress Notes (Signed)
Utilization review completed.  

## 2012-11-24 NOTE — Discharge Summary (Signed)
Physician Discharge Summary  Patient ID: [name] MRN: [id number] DOB/AGE: 05-28-1951 61 y.o.  Admit date: 11/23/2012 Discharge date: 11/24/2012  Admission Diagnoses:Rotator Cuff  Impingement on Left and A-C Joint Arthritis.  Discharge Diagnoses: Same as Admission. Active Problems:  Rotator cuff impingement syndrome  AC joint arthropathy   Discharged Condition: good  Hospital Course: No complications  Consults: OT  Consult  Significant Diagnostic Studies: Routine Labs  Treatments: surgery: Open Decompression of Left Shoulder and Resection of Distal Clavicle on Left.  Discharge Exam: Blood pressure 123/74, pulse 74, temperature 98.7 F (37.1 C), temperature source Oral, resp. rate 16, height 6' 2.5" (1.892 m), weight 89.812 kg (198 lb), SpO2 96.00%. Extremities: Good finction in Hand.  Disposition: 01-Home or Self Care  Discharge Orders    Future Appointments: Provider: Department: Dept Phone: Center:   02/10/2013 9:30 AM Lelon Perla, DO [facility]  [place] [phone] [id number]     Future Orders Please Complete By Expires   Call MD / Call 911      Comments:   If you experience chest pain or shortness of breath, CALL 911 and be transported to the hospital emergency room.  If you develope a fever above 101 F, pus (white drainage) or increased drainage or redness at the wound, or calf pain, call your surgeon's office.   Increase activity slowly as tolerated      Discharge instructions      Comments:   Keep your sling on at all times, including sleeping in your sling.  The only time you should remove your sling is to shower only but you need to keep your hand against your chest while you shower.   For the first few days, remove your dressing, tape a piece of saran wrap over your incision, take your shower, then remove the saran wrap and put a clean dressing on, then reapply your sling.  After two days you can shower without the saran wrap.    Call Dr. Darrelyn Hillock if any wound complications or temperature of 101 degrees F or over.   Call the office for an appointment to see Dr. Darrelyn Hillock in two weeks: [phone] and ask for Dr. Jeannetta Ellis nurse, Mackey [facility]chwood.   Driving restrictions      Comments:   No driving for two weeks       Medication List     As of 11/24/2012  7:27 AM    STOP taking these medications         traMADol 50 MG tablet   Commonly known as: ULTRAM      TAKE these medications         aspirin 81 MG EC tablet   Take 81 mg by mouth at bedtime.      celecoxib 200 MG capsule   Commonly known as: CELEBREX   Take 200 mg by mouth 2 (two) times daily.      clonazePAM 0.5 MG tablet   Commonly known as: KLONOPIN   Take 0.5-1.5 mg by mouth at bedtime. 3 tabs qhs except for the nights where Remeron is taken, then 1 tab qhs.      Co Q 10 100 MG Caps   Take 100 mg by mouth daily.      famotidine 20 MG tablet   Commonly known as: PEPCID   Take 20 mg by mouth daily.      fenofibrate 160 MG tablet   Take 160 mg by mouth at bedtime.      finasteride  5 MG tablet   Commonly known as: PROSCAR   Take 5 mg by mouth every morning.      gabapentin 100 MG capsule   Commonly known as: NEURONTIN   Take 100 mg by mouth 3 (three) times daily.      HYDROcodone-acetaminophen 7.5-750 MG per tablet   Commonly known as: VICODIN ES   Take 1 tablet by mouth every 6 (six) hours as needed for pain.      HYDROcodone-acetaminophen 10-325 MG per tablet   Commonly known as: NORCO   Take 1 tablet by mouth every 4 (four) hours as needed.      metoprolol succinate 25 MG 24 hr tablet   Commonly known as: TOPROL-XL   Take 25 mg by mouth every morning.      mirtazapine 45 MG tablet   Commonly known as: REMERON   Take 45 mg by mouth as directed. Tuesdays, Thursdays, Saturdays and Sundays.      nitroGLYCERIN 0.4 MG SL tablet   Commonly known as: NITROSTAT   Place 1 tablet (0.4 mg total) under the tongue every 5 (five)  minutes as needed. for chest pain      OMEGA-3 KRILL OIL 300 MG Caps   Take 300 mg by mouth daily.      pravastatin 80 MG tablet   Commonly known as: PRAVACHOL   Take 1 tablet (80 mg total) by mouth at bedtime.      ramipril 5 MG capsule   Commonly known as: ALTACE   Take 5 mg by mouth 2 (two) times daily.      tiZANidine 4 MG tablet   Commonly known as: ZANAFLEX   Take 4 mg by mouth 2 (two) times daily as needed. For muscle spasms      Turmeric 500 MG Caps   Take 500 mg by mouth daily.      valACYclovir 500 MG tablet   Commonly known as: VALTREX   Take 500 mg by mouth 2 (two) times daily as needed. For flare up  Of cold sore         Signed: Shelden Raborn A 11/24/2012, 7:27 AM

## 2012-11-24 NOTE — Evaluation (Signed)
Occupational Therapy Evaluation Patient Details Name: [name] MRN: [id number] DOB: 03-15-1951 Today's Date: 11/24/2012 Time: 2595-6387    OT Assessment / Plan / Recommendation Clinical Impression  Pt presents to OT s/p shoulder surgery.  Pt educated in ADL activity with no moving of shoulder, as well as sling education.  Pt and wife able to verbalize and return demonstrate    OT Assessment  Progress rehab of shoulder as ordered by MD at follow-up appointment    Follow Up Recommendations  Outpatient OT                Precautions / Restrictions Precautions Precautions: Shoulder Type of Shoulder Precautions: ADLs only and sling Restrictions Weight Bearing Restrictions: Yes LUE Weight Bearing: Non weight bearing       ADL  Upper Body Bathing: Simulated;Minimal assistance Where Assessed - Upper Body Bathing: Unsupported sitting Lower Body Bathing: Simulated;Minimal assistance Where Assessed - Lower Body Bathing: Unsupported sit to stand Upper Body Dressing: Simulated;Minimal assistance Lower Body Dressing: Simulated;Minimal assistance Where Assessed - Lower Body Dressing: Unsupported sit to stand Toilet Transfer: Performed;Min guard Toilet Transfer Method: Sit to Barista: Comfort height toilet Toileting - Clothing Manipulation and Hygiene: Performed;Minimal assistance Where Assessed - Engineer, mining and Hygiene: Standing ADL Comments: OT provided education regarding sling management, ADL's with no movement of L shoulder, and safety with ADL's    OT Diagnosis: Generalized weakness;Acute pain  OT Problem List: Decreased strength       Visit Information  Last OT Received On: 11/24/12    Subjective Data  Subjective: This sling doesnt seem to fit.  (OT called for a new sling- size large)   Prior Functioning     Home Living Lives With: Spouse Available Help at Discharge: Family Type of Home: House Home Layout: One  level Bathroom Shower/Tub: Walk-in shower Prior Function Level of Independence: Independent Able to Take Stairs?: Yes Driving: Yes Vocation: Retired Musician: No difficulties Dominant Hand:  (both)         Vision/Perception     Cognition  Overall Cognitive Status: Appears within functional limits for tasks assessed/performed Arousal/Alertness: Awake/alert Orientation Level: Appears intact for tasks assessed Behavior During Session: [facility] for tasks performed    Extremity/Trunk Assessment Right Upper Extremity Assessment RUE ROM/Strength/Tone: [facility] for tasks assessed Left Upper Extremity Assessment LUE ROM/Strength/Tone: Unable to fully assess (due to surgery)     Mobility Bed Mobility Bed Mobility: Supine to Sit Supine to Sit: 5: Supervision Transfers Transfers: Sit to Stand;Stand to Sit Sit to Stand: 5: Supervision Stand to Sit: 5: Supervision     Shoulder Instructions  No movement of L shoulder with ADL activity         End of Session OT - End of Session Activity Tolerance: Patient tolerated treatment well Patient left: in bed;with call bell/phone within reach;with family/visitor present  GO Functional Assessment Tool Used: clinical observation Functional Limitation: Self care Self Care Current Status (F6433): At least 1 percent but less than 20 percent impaired, limited or restricted   [name], [place] Kung 11/24/2012, 9:41 AM

## 2012-11-24 NOTE — Op Note (Signed)
NAMETORETTO, TINGLER NO.:  [id number]  MEDICAL RECORD NO.:  [id number]  LOCATION:  1604                         FACILITY:  [facility]  PHYSICIAN:  Georges Lynch. Ghina Bittinger, M.D.DATE OF BIRTH:  [birth year]  DATE OF PROCEDURE:  11/23/2012 DATE OF DISCHARGE:                              OPERATIVE REPORT   SURGEON:  Georges Lynch. Darrelyn Hillock, MD  ASSISTANT:  Dimitri Ped, PA.  PREOPERATIVE DIAGNOSES: 1. Questionable tear of the rotator cuff tendon, left shoulder. 2. Severe impingement syndrome, left shoulder. 3. 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  POSTOPERATIVE DIAGNOSES: 1. Questionable tear of the rotator cuff tendon, left shoulder. 2. Severe impingement syndrome, left shoulder. 3. 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  OPERATION: 1. Open acromionectomy, left shoulder. 2. Resection of distal clavicle, left shoulder. 3. Debridement of the acromioclavicular joint, left shoulder. 4. Subdeltoid bursectomy. 5. Exploration of the rotator cuff.  PROCEDURE IN DETAIL:  Under general anesthesia, routine orthopedic prep and draping left shoulder was carried out.  An appropriate time-out was carried out prior to surgery.  I also marked the appropriate left arm in the holding area.  An incision was made.  The patient was first in the upright position.  He was given clindamycin 900 mg.  An incision was made over the anterior aspect of the right shoulder.  Bleeders were identified and cauterized.  I then stripped the deltoid ligament from the acromion.  I identified the [facility] joint which was severely arthritic. The distal end of the clavicle was severely overgrown and the joint was severely arthritic.  I first debrided the joint, I then utilized the oscillating saw and resected about 1 cm of the distal clavicle.  The distal AC joint  also was impinging on the rotator cuff.  Once I had this cleaned and removed the sub deltoid ligament, I explored the cuff. First I protected the underlying cuff with a Bennett retractor, did an open acromionectomy with the oscillating saw and a bur.  I made sure we re-established the subacromial space.  Note, the entire acromion was adhesed down to the bursa and down to the rotator cuff.  I had to free that up with the gloved finger first.  After this was done, I then reused for the rotator cuff.  There were really no major tears in the cuff but needed repair.  I went far out laterally as well on the acromion to make sure there was no further impingement.  I thoroughly irrigated out the area.  I bone waxed the distal clavicle resection site and also the acromion.  I inserted some thrombin-soaked Gelfoam.  When this was all done, after we irrigated out the shoulder, reapproximated deltoid tendon muscle in usual fashion.  The subcu was closed with 0 Vicryl and a subcuticular Monocryl suture was carried out.  Sterile dressings were applied.  Prior to closing the wound, I injected the soft tissue with 20 mL of Exparel.  The patient was placed in a shoulder immobilizer.  ______________________________ Georges Lynch Darrelyn Hillock, M.D.     RAG/MEDQ  D:  11/23/2012  T:  11/24/2012  Job:  [id number]

## 2012-11-25 ENCOUNTER — Ambulatory Visit: Payer: Federal, State, Local not specified - PPO | Admitting: Psychology

## 2012-11-25 NOTE — Progress Notes (Signed)
11/24/12 0940  OT Time Calculation  OT Stop Time 0940  OT G-codes **NOT FOR INPATIENT CLASS**  Functional Assessment Tool Used clinical observation  Functional Limitation Self care  Self Care Current Status (Z5638) CI  Self Care Goal Status (V5643) CI  Self Care Discharge Status (P2951) CI   Evaluation completed by Lise Auer:  gcodes finished by Marica Otter, OTR/L

## 2012-12-02 ENCOUNTER — Ambulatory Visit (INDEPENDENT_AMBULATORY_CARE_PROVIDER_SITE_OTHER): Payer: Federal, State, Local not specified - PPO | Admitting: Psychology

## 2012-12-02 DIAGNOSIS — F4322 Adjustment disorder with anxiety: Secondary | ICD-10-CM

## 2012-12-09 ENCOUNTER — Ambulatory Visit (INDEPENDENT_AMBULATORY_CARE_PROVIDER_SITE_OTHER): Payer: Federal, State, Local not specified - PPO | Admitting: Psychology

## 2012-12-09 DIAGNOSIS — F4322 Adjustment disorder with anxiety: Secondary | ICD-10-CM

## 2012-12-13 ENCOUNTER — Ambulatory Visit (INDEPENDENT_AMBULATORY_CARE_PROVIDER_SITE_OTHER): Payer: Federal, State, Local not specified - PPO | Admitting: Psychology

## 2012-12-13 DIAGNOSIS — F4322 Adjustment disorder with anxiety: Secondary | ICD-10-CM

## 2012-12-20 ENCOUNTER — Ambulatory Visit: Payer: Federal, State, Local not specified - PPO

## 2012-12-23 ENCOUNTER — Ambulatory Visit (INDEPENDENT_AMBULATORY_CARE_PROVIDER_SITE_OTHER): Payer: Federal, State, Local not specified - PPO | Admitting: Psychology

## 2012-12-23 DIAGNOSIS — F4322 Adjustment disorder with anxiety: Secondary | ICD-10-CM

## 2013-01-05 ENCOUNTER — Ambulatory Visit: Payer: Federal, State, Local not specified - PPO | Admitting: Psychology

## 2013-01-10 ENCOUNTER — Other Ambulatory Visit: Payer: Self-pay | Admitting: Family Medicine

## 2013-01-10 NOTE — Telephone Encounter (Signed)
Last seen 06/10/12 and filled 06/25/12 #90 with 5 refills. Please advise      KP

## 2013-01-13 ENCOUNTER — Ambulatory Visit: Payer: Federal, State, Local not specified - PPO | Admitting: Psychology

## 2013-01-28 ENCOUNTER — Other Ambulatory Visit: Payer: Self-pay

## 2013-02-01 ENCOUNTER — Ambulatory Visit (INDEPENDENT_AMBULATORY_CARE_PROVIDER_SITE_OTHER): Payer: Federal, State, Local not specified - PPO | Admitting: Psychology

## 2013-02-01 DIAGNOSIS — F4322 Adjustment disorder with anxiety: Secondary | ICD-10-CM

## 2013-02-07 ENCOUNTER — Ambulatory Visit (INDEPENDENT_AMBULATORY_CARE_PROVIDER_SITE_OTHER): Payer: Federal, State, Local not specified - PPO | Admitting: Psychology

## 2013-02-07 DIAGNOSIS — F4322 Adjustment disorder with anxiety: Secondary | ICD-10-CM

## 2013-02-09 ENCOUNTER — Encounter: Payer: Federal, State, Local not specified - PPO | Admitting: Family Medicine

## 2013-02-10 ENCOUNTER — Encounter: Payer: Federal, State, Local not specified - PPO | Admitting: Family Medicine

## 2013-02-15 ENCOUNTER — Encounter: Payer: Self-pay | Admitting: Cardiology

## 2013-02-15 ENCOUNTER — Ambulatory Visit (INDEPENDENT_AMBULATORY_CARE_PROVIDER_SITE_OTHER): Payer: Federal, State, Local not specified - PPO | Admitting: Cardiology

## 2013-02-15 ENCOUNTER — Encounter (INDEPENDENT_AMBULATORY_CARE_PROVIDER_SITE_OTHER): Payer: Federal, State, Local not specified - PPO

## 2013-02-15 VITALS — BP 110/78 | HR 73 | Ht 75.5 in | Wt 190.0 lb

## 2013-02-15 DIAGNOSIS — I714 Abdominal aortic aneurysm, without rupture, unspecified: Secondary | ICD-10-CM

## 2013-02-15 DIAGNOSIS — I10 Essential (primary) hypertension: Secondary | ICD-10-CM

## 2013-02-15 DIAGNOSIS — E785 Hyperlipidemia, unspecified: Secondary | ICD-10-CM

## 2013-02-15 DIAGNOSIS — I25810 Atherosclerosis of coronary artery bypass graft(s) without angina pectoris: Secondary | ICD-10-CM

## 2013-02-15 DIAGNOSIS — I711 Thoracic aortic aneurysm, ruptured, unspecified: Secondary | ICD-10-CM

## 2013-02-15 NOTE — Patient Instructions (Signed)
Your physician wants you to follow-up in: 6 MONTHS with Dr Shirlee Latch (previous pt of Dr [name]). You will receive a reminder letter in the mail two months in advance. If you don't receive a letter, please call our office to schedule the follow-up appointment.  Your physician has requested that you have an abdominal aorta duplex in 1 YEAR. During this test, an ultrasound is used to evaluate the aorta. Allow 30 minutes for this exam. Do not eat after midnight the day before and avoid carbonated beverages  Your physician recommends that you continue on your current medications as directed. Please refer to the Current Medication list given to you today.

## 2013-02-15 NOTE — Assessment & Plan Note (Signed)
Patient study reviewed today.  Slight growth.  Will need follow up in one year.

## 2013-02-15 NOTE — Assessment & Plan Note (Signed)
Stable.  Followed by Dr. Laury Axon.
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
## 2013-02-15 NOTE — Assessment & Plan Note (Signed)
Currently controlled.

## 2013-02-17 NOTE — Assessment & Plan Note (Signed)
No current symptoms at the present time.

## 2013-03-01 ENCOUNTER — Ambulatory Visit: Payer: Federal, State, Local not specified - PPO | Admitting: Psychology

## 2013-03-08 ENCOUNTER — Ambulatory Visit (INDEPENDENT_AMBULATORY_CARE_PROVIDER_SITE_OTHER): Payer: Federal, State, Local not specified - PPO | Admitting: Psychology

## 2013-03-08 DIAGNOSIS — F4322 Adjustment disorder with anxiety: Secondary | ICD-10-CM

## 2013-03-17 ENCOUNTER — Encounter: Payer: Self-pay | Admitting: Lab

## 2013-03-20 ENCOUNTER — Ambulatory Visit (INDEPENDENT_AMBULATORY_CARE_PROVIDER_SITE_OTHER): Payer: Federal, State, Local not specified - PPO | Admitting: Family Medicine

## 2013-03-20 ENCOUNTER — Encounter: Payer: Self-pay | Admitting: Family Medicine

## 2013-03-20 VITALS — BP 118/70 | HR 51 | Temp 97.9°F | Ht 74.0 in | Wt 185.2 lb

## 2013-03-20 DIAGNOSIS — I2510 Atherosclerotic heart disease of native coronary artery without angina pectoris: Secondary | ICD-10-CM

## 2013-03-20 DIAGNOSIS — G4733 Obstructive sleep apnea (adult) (pediatric): Secondary | ICD-10-CM

## 2013-03-20 DIAGNOSIS — G4700 Insomnia, unspecified: Secondary | ICD-10-CM

## 2013-03-20 DIAGNOSIS — E785 Hyperlipidemia, unspecified: Secondary | ICD-10-CM

## 2013-03-20 DIAGNOSIS — I10 Essential (primary) hypertension: Secondary | ICD-10-CM

## 2013-03-20 DIAGNOSIS — Z0000 Encounter for general adult medical examination without abnormal findings: Secondary | ICD-10-CM

## 2013-03-20 LAB — HEPATIC FUNCTION PANEL
ALT: 15 U/L (ref 0–53)
AST: 21 U/L (ref 0–37)
Albumin: 4.5 g/dL (ref 3.5–5.2)
Alkaline Phosphatase: 37 U/L — ABNORMAL LOW (ref 39–117)
Bilirubin, Direct: 0.1 mg/dL (ref 0.0–0.3)
Total Bilirubin: 0.7 mg/dL (ref 0.3–1.2)
Total Protein: 7.1 g/dL (ref 6.0–8.3)

## 2013-03-20 LAB — BASIC METABOLIC PANEL
BUN: 12 mg/dL (ref 6–23)
CO2: 28 mEq/L (ref 19–32)
Calcium: 9.3 mg/dL (ref 8.4–10.5)
Chloride: 105 mEq/L (ref 96–112)
Creatinine, Ser: 1.2 mg/dL (ref 0.4–1.5)
GFR: 68 mL/min (ref >60.00)
Glucose, Bld: 86 mg/dL (ref 70–99)
Potassium: 3.6 mEq/L (ref 3.5–5.1)
Sodium: 141 mEq/L (ref 135–145)

## 2013-03-20 LAB — LIPID PANEL
Cholesterol: 146 mg/dL (ref 0–200)
HDL: 34.5 mg/dL — ABNORMAL LOW (ref >39.00)
LDL Cholesterol: 82 mg/dL (ref 0–99)
Total CHOL/HDL Ratio: 4
Triglycerides: 149 mg/dL (ref 0.0–149.0)
VLDL: 29.8 mg/dL (ref 0.0–40.0)

## 2013-03-20 MED ORDER — MIRTAZAPINE 45 MG PO TABS: 45.0000 mg | ORAL_TABLET | Freq: Every day | ORAL | Status: DC

## 2013-03-20 MED ORDER — PRAVASTATIN SODIUM 80 MG PO TABS
80.0000 mg | ORAL_TABLET | Freq: Every day | ORAL | Status: DC
Start: 2013-03-20 — End: 2014-02-15

## 2013-03-20 MED ORDER — RAMIPRIL 5 MG PO CAPS: 5.0000 mg | ORAL_CAPSULE | Freq: Two times a day (BID) | ORAL | Status: DC

## 2013-03-20 MED ORDER — FENOFIBRATE 160 MG PO TABS: 160.0000 mg | ORAL_TABLET | Freq: Every day | ORAL | Status: DC

## 2013-03-20 MED ORDER — METOPROLOL SUCCINATE ER 25 MG PO TB24: 25.0000 mg | ORAL_TABLET | Freq: Every morning | ORAL | Status: DC

## 2013-03-20 MED ORDER — CLONAZEPAM 0.5 MG PO TABS: 0.5000 mg | ORAL_TABLET | Freq: Every day | ORAL | Status: DC

## 2013-03-20 NOTE — Assessment & Plan Note (Signed)
Check labs Cont' meds 
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
## 2013-03-20 NOTE — Assessment & Plan Note (Signed)
Stable con't meds 

## 2013-03-20 NOTE — Patient Instructions (Signed)
Preventive Care for Adults, Male A healthy lifestyle and preventive care can promote health and wellness. Preventive health guidelines for men include the following key practices:  A routine yearly physical is a good way to check with your caregiver about your health and preventative screening. It is a chance to share any concerns and updates on your health, and to receive a thorough exam.  Visit your dentist for a routine exam and preventative care every 6 months. Brush your teeth twice a day and floss once a day. Good oral hygiene prevents tooth decay and gum disease.  The frequency of eye exams is based on your age, health, family medical history, use of contact lenses, and other factors. Follow your caregiver's recommendations for frequency of eye exams.  Eat a healthy diet. Foods like vegetables, fruits, whole grains, low-fat dairy products, and lean protein foods contain the nutrients you need without too many calories. Decrease your intake of foods high in solid fats, added sugars, and salt. Eat the right amount of calories for you.Get information about a proper diet from your caregiver, if necessary.  Regular physical exercise is one of the most important things you can do for your health. Most adults should get at least 150 minutes of moderate-intensity exercise (any activity that increases your heart rate and causes you to sweat) each week. In addition, most adults need muscle-strengthening exercises on 2 or more days a week.  Maintain a healthy weight. The body mass index (BMI) is a screening tool to identify possible weight problems. It provides an estimate of body fat based on height and weight. Your caregiver can help determine your BMI, and can help you achieve or maintain a healthy weight.For adults 20 years and older:  A BMI below 18.5 is considered underweight.  A BMI of 18.5 to 24.9 is normal.  A BMI of 25 to 29.9 is considered overweight.  A BMI of 30 and above is  considered obese.  Maintain normal blood lipids and cholesterol levels by exercising and minimizing your intake of saturated fat. Eat a balanced diet with plenty of fruit and vegetables. Blood tests for lipids and cholesterol should begin at age 20 and be repeated every 5 years. If your lipid or cholesterol levels are high, you are over 50, or you are a high risk for heart disease, you may need your cholesterol levels checked more frequently.Ongoing high lipid and cholesterol levels should be treated with medicines if diet and exercise are not effective.  If you smoke, find out from your caregiver how to quit. If you do not use tobacco, do not start.  If you choose to drink alcohol, do not exceed 2 drinks per day. One drink is considered to be 12 ounces (355 mL) of beer, 5 ounces (148 mL) of wine, or 1.5 ounces (44 mL) of liquor.  Avoid use of street drugs. Do not share needles with anyone. Ask for help if you need support or instructions about stopping the use of drugs.  High blood pressure causes heart disease and increases the risk of stroke. Your blood pressure should be checked at least every 1 to 2 years. Ongoing high blood pressure should be treated with medicines, if weight loss and exercise are not effective.  If you are 45 to 61 years old, ask your caregiver if you should take aspirin to prevent heart disease.  Diabetes screening involves taking a blood sample to check your fasting blood sugar level. This should be done once every 3 years,   after age 45, if you are within normal weight and without risk factors for diabetes. Testing should be considered at a younger age or be carried out more frequently if you are overweight and have at least 1 risk factor for diabetes.  Colorectal cancer can be detected and often prevented. Most routine colorectal cancer screening begins at the age of 50 and continues through age 75. However, your caregiver may recommend screening at an earlier age if you  have risk factors for colon cancer. On a yearly basis, your caregiver may provide home test kits to check for hidden blood in the stool. Use of a small camera at the end of a tube, to directly examine the colon (sigmoidoscopy or colonoscopy), can detect the earliest forms of colorectal cancer. Talk to your caregiver about this at age 50, when routine screening begins. Direct examination of the colon should be repeated every 5 to 10 years through age 75, unless early forms of pre-cancerous polyps or small growths are found.  Hepatitis C blood testing is recommended for all people born from 1945 through 1965 and any individual with known risks for hepatitis C.  Practice safe sex. Use condoms and avoid high-risk sexual practices to reduce the spread of sexually transmitted infections (STIs). STIs include gonorrhea, chlamydia, syphilis, trichomonas, herpes, HPV, and human immunodeficiency virus (HIV). Herpes, HIV, and HPV are viral illnesses that have no cure. They can result in disability, cancer, and death.  A one-time screening for abdominal aortic aneurysm (AAA) and surgical repair of large AAAs by sound wave imaging (ultrasonography) is recommended for ages 65 to 75 years who are current or former smokers.  Healthy men should no longer receive prostate-specific antigen (PSA) blood tests as part of routine cancer screening. Consult with your caregiver about prostate cancer screening.  Testicular cancer screening is not recommended for adult males who have no symptoms. Screening includes self-exam, caregiver exam, and other screening tests. Consult with your caregiver about any symptoms you have or any concerns you have about testicular cancer.  Use sunscreen with skin protection factor (SPF) of 30 or more. Apply sunscreen liberally and repeatedly throughout the day. You should seek shade when your shadow is shorter than you. Protect yourself by wearing long sleeves, pants, a wide-brimmed hat, and  sunglasses year round, whenever you are outdoors.  Once a month, do a whole body skin exam, using a mirror to look at the skin on your back. Notify your caregiver of new moles, moles that have irregular borders, moles that are larger than a pencil eraser, or moles that have changed in shape or color.  Stay current with required immunizations.  Influenza. You need a dose every fall (or winter). The composition of the flu vaccine changes each year, so being vaccinated once is not enough.  Pneumococcal polysaccharide. You need 1 to 2 doses if you smoke cigarettes or if you have certain chronic medical conditions. You need 1 dose at age 65 (or older) if you have never been vaccinated.  Tetanus, diphtheria, pertussis (Tdap, Td). Get 1 dose of Tdap vaccine if you are younger than age 65 years, are over 65 and have contact with an infant, are a healthcare worker, or simply want to be protected from whooping cough. After that, you need a Td booster dose every 10 years. Consult your caregiver if you have not had at least 3 tetanus and diphtheria-containing shots sometime in your life or have a deep or dirty wound.  HPV. This vaccine is recommended   for males 13 through 61 years of age. This vaccine may be given to men 22 through 61 years of age who have not completed the 3 dose series. It is recommended for men through age 26 who have sex with men or whose immune system is weakened because of HIV infection, other illness, or medications. The vaccine is given in 3 doses over 6 months.  Measles, mumps, rubella (MMR). You need at least 1 dose of MMR if you were born in 1957 or later. You may also need a 2nd dose.  Meningococcal. If you are age 19 to 21 years and a first-year college student living in a residence hall, or have one of several medical conditions, you need to get vaccinated against meningococcal disease. You may also need additional booster doses.  Zoster (shingles). If you are age 60 years or  older, you should get this vaccine.  Varicella (chickenpox). If you have never had chickenpox or you were vaccinated but received only 1 dose, talk to your caregiver to find out if you need this vaccine.  Hepatitis A. You need this vaccine if you have a specific risk factor for hepatitis A virus infection, or you simply wish to be protected from this disease. The vaccine is usually given as 2 doses, 6 to 18 months apart.  Hepatitis B. You need this vaccine if you have a specific risk factor for hepatitis B virus infection or you simply wish to be protected from this disease. The vaccine is given in 3 doses, usually over 6 months. Preventative Service / Frequency Ages 19 to 39  Blood pressure check.** / Every 1 to 2 years.  Lipid and cholesterol check.** / Every 5 years beginning at age 20.  Hepatitis C blood test.** / For any individual with known risks for hepatitis C.  Skin self-exam. / Monthly.  Influenza immunization.** / Every year.  Pneumococcal polysaccharide immunization.** / 1 to 2 doses if you smoke cigarettes or if you have certain chronic medical conditions.  Tetanus, diphtheria, pertussis (Tdap,Td) immunization. / A one-time dose of Tdap vaccine. After that, you need a Td booster dose every 10 years.  HPV immunization. / 3 doses over 6 months, if 26 and younger.  Measles, mumps, rubella (MMR) immunization. / You need at least 1 dose of MMR if you were born in 1957 or later. You may also need a 2nd dose.  Meningococcal immunization. / 1 dose if you are age 19 to 21 years and a first-year college student living in a residence hall, or have one of several medical conditions, you need to get vaccinated against meningococcal disease. You may also need additional booster doses.  Varicella immunization.** / Consult your caregiver.  Hepatitis A immunization.** / Consult your caregiver. 2 doses, 6 to 18 months apart.  Hepatitis B immunization.** / Consult your caregiver. 3 doses  usually over 6 months. Ages 40 to 64  Blood pressure check.** / Every 1 to 2 years.  Lipid and cholesterol check.** / Every 5 years beginning at age 20.  Fecal occult blood test (FOBT) of stool. / Every year beginning at age 50 and continuing until age 75. You may not have to do this test if you get colonoscopy every 10 years.  Flexible sigmoidoscopy** or colonoscopy.** / Every 5 years for a flexible sigmoidoscopy or every 10 years for a colonoscopy beginning at age 50 and continuing until age 75.  Hepatitis C blood test.** / For all people born from 1945 through 1965 and any   individual with known risks for hepatitis C.  Skin self-exam. / Monthly.  Influenza immunization.** / Every year.  Pneumococcal polysaccharide immunization.** / 1 to 2 doses if you smoke cigarettes or if you have certain chronic medical conditions.  Tetanus, diphtheria, pertussis (Tdap/Td) immunization.** / A one-time dose of Tdap vaccine. After that, you need a Td booster dose every 10 years.  Measles, mumps, rubella (MMR) immunization. / You need at least 1 dose of MMR if you were born in 1957 or later. You may also need a 2nd dose.  Varicella immunization.**/ Consult your caregiver.  Meningococcal immunization.** / Consult your caregiver.  Hepatitis A immunization.** / Consult your caregiver. 2 doses, 6 to 18 months apart.  Hepatitis B immunization.** / Consult your caregiver. 3 doses, usually over 6 months. Ages 65 and over  Blood pressure check.** / Every 1 to 2 years.  Lipid and cholesterol check.**/ Every 5 years beginning at age 20.  Fecal occult blood test (FOBT) of stool. / Every year beginning at age 50 and continuing until age 75. You may not have to do this test if you get colonoscopy every 10 years.  Flexible sigmoidoscopy** or colonoscopy.** / Every 5 years for a flexible sigmoidoscopy or every 10 years for a colonoscopy beginning at age 50 and continuing until age 75.  Hepatitis C blood  test.** / For all people born from 1945 through 1965 and any individual with known risks for hepatitis C.  Abdominal aortic aneurysm (AAA) screening.** / A one-time screening for ages 65 to 75 years who are current or former smokers.  Skin self-exam. / Monthly.  Influenza immunization.** / Every year.  Pneumococcal polysaccharide immunization.** / 1 dose at age 65 (or older) if you have never been vaccinated.  Tetanus, diphtheria, pertussis (Tdap, Td) immunization. / A one-time dose of Tdap vaccine if you are over 65 and have contact with an infant, are a healthcare worker, or simply want to be protected from whooping cough. After that, you need a Td booster dose every 10 years.  Varicella immunization. ** / Consult your caregiver.  Meningococcal immunization.** / Consult your caregiver.  Hepatitis A immunization. ** / Consult your caregiver. 2 doses, 6 to 18 months apart.  Hepatitis B immunization.** / Check with your caregiver. 3 doses, usually over 6 months. **Family history and personal history of risk and conditions may change your caregiver's recommendations. Document Released: 01/26/2002 Document Revised: 02/22/2012 Document Reviewed: 04/27/2011 Exit[employer] Patient Information 2013 [employer].  

## 2013-03-20 NOTE — Assessment & Plan Note (Signed)
Per neuro 

## 2013-03-20 NOTE — Assessment & Plan Note (Signed)
Cont cpap

## 2013-03-21 LAB — POCT URINALYSIS DIPSTICK
Bilirubin, UA: NEGATIVE
Blood, UA: NEGATIVE
Glucose, UA: NEGATIVE
Ketones, UA: NEGATIVE
Leukocytes, UA: NEGATIVE
Nitrite, UA: NEGATIVE
Protein, UA: NEGATIVE
Spec Grav, UA: 1.01
Urobilinogen, UA: 0.2
pH, UA: 6

## 2013-03-22 ENCOUNTER — Ambulatory Visit (INDEPENDENT_AMBULATORY_CARE_PROVIDER_SITE_OTHER): Payer: Federal, State, Local not specified - PPO | Admitting: Psychology

## 2013-03-22 DIAGNOSIS — F4322 Adjustment disorder with anxiety: Secondary | ICD-10-CM

## 2013-03-29 ENCOUNTER — Ambulatory Visit: Payer: Federal, State, Local not specified - PPO | Admitting: Psychology

## 2013-04-13 ENCOUNTER — Encounter: Payer: Self-pay | Admitting: Family Medicine

## 2013-04-19 ENCOUNTER — Ambulatory Visit: Payer: Federal, State, Local not specified - PPO | Admitting: Psychology

## 2013-05-03 ENCOUNTER — Ambulatory Visit (INDEPENDENT_AMBULATORY_CARE_PROVIDER_SITE_OTHER): Payer: Federal, State, Local not specified - PPO | Admitting: Psychology

## 2013-05-03 DIAGNOSIS — F4322 Adjustment disorder with anxiety: Secondary | ICD-10-CM

## 2013-06-27 ENCOUNTER — Ambulatory Visit (INDEPENDENT_AMBULATORY_CARE_PROVIDER_SITE_OTHER): Payer: Federal, State, Local not specified - PPO | Admitting: Psychology

## 2013-06-27 DIAGNOSIS — F4322 Adjustment disorder with anxiety: Secondary | ICD-10-CM

## 2013-07-03 ENCOUNTER — Encounter: Payer: Self-pay | Admitting: Family Medicine

## 2013-07-03 ENCOUNTER — Other Ambulatory Visit: Payer: Self-pay | Admitting: Family Medicine

## 2013-07-05 ENCOUNTER — Ambulatory Visit (INDEPENDENT_AMBULATORY_CARE_PROVIDER_SITE_OTHER): Payer: Federal, State, Local not specified - PPO | Admitting: Psychology

## 2013-07-05 DIAGNOSIS — F4322 Adjustment disorder with anxiety: Secondary | ICD-10-CM

## 2013-07-11 ENCOUNTER — Other Ambulatory Visit: Payer: Self-pay | Admitting: Surgery

## 2013-07-12 ENCOUNTER — Other Ambulatory Visit: Payer: Self-pay

## 2013-07-12 ENCOUNTER — Ambulatory Visit (INDEPENDENT_AMBULATORY_CARE_PROVIDER_SITE_OTHER): Payer: Federal, State, Local not specified - PPO | Admitting: Neurology

## 2013-07-12 ENCOUNTER — Encounter: Payer: Self-pay | Admitting: Neurology

## 2013-07-12 ENCOUNTER — Other Ambulatory Visit: Payer: Self-pay | Admitting: Neurology

## 2013-07-12 VITALS — BP 125/68 | HR 68 | Resp 16 | Ht 75.5 in | Wt 188.0 lb

## 2013-07-12 DIAGNOSIS — F959 Tic disorder, unspecified: Secondary | ICD-10-CM

## 2013-07-12 DIAGNOSIS — G4700 Insomnia, unspecified: Secondary | ICD-10-CM

## 2013-07-12 MED ORDER — CLONAZEPAM 0.5 MG PO TABS: 0.5000 mg | ORAL_TABLET | Freq: Every day | ORAL | Status: DC

## 2013-07-12 MED ORDER — GABAPENTIN 100 MG PO CAPS: 100.0000 mg | ORAL_CAPSULE | Freq: Three times a day (TID) | ORAL | Status: DC

## 2013-07-12 MED ORDER — GABAPENTIN 300 MG PO CAPS: 300.0000 mg | ORAL_CAPSULE | Freq: Three times a day (TID) | ORAL | Status: DC

## 2013-07-12 MED ORDER — GABAPENTIN 100 MG PO CAPS: ORAL_CAPSULE | ORAL | Status: DC
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
## 2013-07-12 NOTE — Assessment & Plan Note (Signed)
Insomnia chronic

## 2013-07-12 NOTE — Patient Instructions (Addendum)
Insomnia Insomnia Insomnia is frequent trouble falling and/or staying asleep. Insomnia can be a long term problem or a short term problem. Both are common. Insomnia can be a short term problem when the wakefulness is related to a certain stress or worry. Long term insomnia is often related to ongoing stress during waking hours and/or poor sleeping habits. Overtime, sleep deprivation itself can make the problem worse. Every little thing feels more severe because you are overtired and your ability to cope is decreased. CAUSES   Stress, anxiety, and depression.  Poor sleeping habits.  Distractions such as TV in the bedroom.  Naps close to bedtime.  Engaging in emotionally charged conversations before bed.  Technical reading before sleep.  Alcohol and other sedatives. They may make the problem worse. They can hurt normal sleep patterns and normal dream activity.  Stimulants such as caffeine for several hours prior to bedtime.  Pain syndromes and shortness of breath can cause insomnia.  Exercise late at night.  Changing time zones may cause sleeping problems (jet lag). It is sometimes helpful to have someone observe your sleeping patterns. They should look for periods of not breathing during the night (sleep apnea). They should also look to see how long those periods last. If you live alone or observers are uncertain, you can also be observed at a sleep clinic where your sleep patterns will be professionally monitored. Sleep apnea requires a checkup and treatment. Give your caregivers your medical history. Give your caregivers observations your family has made about your sleep.  SYMPTOMS   Not feeling rested in the morning.  Anxiety and restlessness at bedtime.  Difficulty falling and staying asleep. TREATMENT   Your caregiver may prescribe treatment for an underlying medical disorders. Your caregiver can give advice or help if you are using alcohol or other drugs for self-medication.  Treatment of underlying problems will usually eliminate insomnia problems.  Medications can be prescribed for short time use. They are generally not recommended for lengthy use.  Over-the-counter sleep medicines are not recommended for lengthy use. They can be habit forming.  You can promote easier sleeping by making lifestyle changes such as:  Using relaxation techniques that help with breathing and reduce muscle tension.  Exercising earlier in the day.  Changing your diet and the time of your last meal. No night time snacks.  Establish a regular time to go to bed.  Counseling can help with stressful problems and worry.  Soothing music and white noise may be helpful if there are background noises you cannot remove.  Stop tedious detailed work at least one hour before bedtime. HOME CARE INSTRUCTIONS   Keep a diary. Inform your caregiver about your progress. This includes any medication side effects. See your caregiver regularly. Take note of:  Times when you are asleep.  Times when you are awake during the night.  The quality of your sleep.  How you feel the next day. This information will help your caregiver care for you.  Get out of bed if you are still awake after 15 minutes. Read or do some quiet activity. Keep the lights down. Wait until you feel sleepy and go back to bed.  Keep regular sleeping and waking hours. Avoid naps.  Exercise regularly.  Avoid distractions at bedtime. Distractions include watching television or engaging in any intense or detailed activity like attempting to balance the household checkbook.  Develop a bedtime ritual. Keep a familiar routine of bathing, brushing your teeth, climbing into bed at the   same time each night, listening to soothing music. Routines increase the success of falling to sleep faster.  Use relaxation techniques. This can be using breathing and muscle tension release routines. It can also include visualizing peaceful scenes.  You can also help control troubling or intruding thoughts by keeping your mind occupied with boring or repetitive thoughts like the old concept of counting sheep. You can make it more creative like imagining planting one beautiful flower after another in your backyard garden.  During your day, work to eliminate stress. When this is not possible use some of the previous suggestions to help reduce the anxiety that accompanies stressful situations. MAKE SURE YOU:   Understand these instructions.  Will watch your condition.  Will get help right away if you are not doing well or get worse. Document Released: 11/27/2000 Document Revised: 02/22/2012 Document Reviewed: 12/28/2007 Exit[employer] Patient Information 2014 [employer].  

## 2013-07-14 ENCOUNTER — Ambulatory Visit (INDEPENDENT_AMBULATORY_CARE_PROVIDER_SITE_OTHER): Payer: Federal, State, Local not specified - PPO | Admitting: Psychology

## 2013-07-14 DIAGNOSIS — F4322 Adjustment disorder with anxiety: Secondary | ICD-10-CM

## 2013-07-15 ENCOUNTER — Encounter: Payer: Self-pay | Admitting: Neurology

## 2013-07-20 ENCOUNTER — Ambulatory Visit (INDEPENDENT_AMBULATORY_CARE_PROVIDER_SITE_OTHER): Payer: Federal, State, Local not specified - PPO | Admitting: Psychology

## 2013-07-20 DIAGNOSIS — F4322 Adjustment disorder with anxiety: Secondary | ICD-10-CM

## 2013-07-28 ENCOUNTER — Ambulatory Visit: Payer: Federal, State, Local not specified - PPO | Admitting: Psychology

## 2013-08-03 ENCOUNTER — Ambulatory Visit (INDEPENDENT_AMBULATORY_CARE_PROVIDER_SITE_OTHER): Payer: Federal, State, Local not specified - PPO | Admitting: Psychology

## 2013-08-03 DIAGNOSIS — F4322 Adjustment disorder with anxiety: Secondary | ICD-10-CM

## 2013-08-08 ENCOUNTER — Ambulatory Visit (INDEPENDENT_AMBULATORY_CARE_PROVIDER_SITE_OTHER): Payer: Federal, State, Local not specified - PPO | Admitting: Family Medicine

## 2013-08-08 ENCOUNTER — Encounter: Payer: Self-pay | Admitting: Family Medicine

## 2013-08-08 VITALS — BP 120/74 | HR 66 | Temp 98.6°F | Wt 195.6 lb

## 2013-08-08 DIAGNOSIS — H60399 Other infective otitis externa, unspecified ear: Secondary | ICD-10-CM

## 2013-08-08 DIAGNOSIS — H60393 Other infective otitis externa, bilateral: Secondary | ICD-10-CM

## 2013-08-08 MED ORDER — OFLOXACIN 0.3 % OT SOLN: 10.0000 [drp] | Freq: Every day | OTIC | Status: DC

## 2013-08-08 NOTE — Patient Instructions (Signed)
Otitis Externa Otitis externa is a bacterial or fungal infection of the outer ear canal. This is the area from the eardrum to the outside of the ear. Otitis externa is sometimes called "swimmer's ear." CAUSES  Possible causes of infection include:  Swimming in dirty water.  Moisture remaining in the ear after swimming or bathing.  Mild injury (trauma) to the ear.  Objects stuck in the ear (foreign body).  Cuts or scrapes (abrasions) on the outside of the ear. SYMPTOMS  The first symptom of infection is often itching in the ear canal. Later signs and symptoms may include swelling and redness of the ear canal, ear pain, and yellowish-white fluid (pus) coming from the ear. The ear pain may be worse when pulling on the earlobe. DIAGNOSIS  Your caregiver will perform a physical exam. A sample of fluid may be taken from the ear and examined for bacteria or fungi. TREATMENT  Antibiotic ear drops are often given for 10 to 14 days. Treatment may also include pain medicine or corticosteroids to reduce itching and swelling. PREVENTION   Keep your ear dry. Use the corner of a towel to absorb water out of the ear canal after swimming or bathing.  Avoid scratching or putting objects inside your ear. This can damage the ear canal or remove the protective wax that lines the canal. This makes it easier for bacteria and fungi to grow.  Avoid swimming in lakes, polluted water, or poorly chlorinated pools.  You may use ear drops made of rubbing alcohol and vinegar after swimming. Combine equal parts of white vinegar and alcohol in a bottle. Put 3 or 4 drops into each ear after swimming. HOME CARE INSTRUCTIONS   Apply antibiotic ear drops to the ear canal as prescribed by your caregiver.  Only take over-the-counter or prescription medicines for pain, discomfort, or fever as directed by your caregiver.  If you have diabetes, follow any additional treatment instructions from your caregiver.  Keep all  follow-up appointments as directed by your caregiver. SEEK MEDICAL CARE IF:   You have a fever.  Your ear is still red, swollen, painful, or draining pus after 3 days.  Your redness, swelling, or pain gets worse.  You have a severe headache.  You have redness, swelling, pain, or tenderness in the area behind your ear. MAKE SURE YOU:   Understand these instructions.  Will watch your condition.  Will get help right away if you are not doing well or get worse. Document Released: 11/30/2005 Document Revised: 02/22/2012 Document Reviewed: 12/17/2011 Exit[employer] Patient Information 2014 [employer].  

## 2013-08-09 NOTE — Progress Notes (Signed)
  Subjective:     [name] is a 62 y.o. male who presents for evaluation of right ear pain. Symptoms have been present for several days. He also notes no drainage, mild pain in the right ear and a plugged sensation in the right ear. He does not have a history of ear infections. He does not have a history of recent swimming.  The patient's history has been marked as reviewed and updated as appropriate.   Review of Systems Pertinent items are noted in HPI.   Objective:    BP 120/74  Pulse 66  Temp(Src) 98.6 F (37 C) (Oral)  Wt 195 lb 9.6 oz (88.724 kg)  BMI 24.12 kg/m2  SpO2 96% General:  alert, cooperative, appears stated age and no distress  Right Ear: right canal red and inflamed  Left Ear: normal appearance  Mouth:  lips, mucosa, and tongue normal; teeth and gums normal  Neck: no adenopathy, no carotid bruit, no JVD, supple, symmetrical, trachea midline and thyroid not enlarged, symmetric, no tenderness/mass/nodules       Assessment:    Right otitis externa    Plan:    Treatment: Floxin Otic. OTC analgesia as needed. Water exclusion from affected ear until symptoms resolve. Follow up in 1 week if symptoms not improving.

## 2013-08-10 ENCOUNTER — Ambulatory Visit (INDEPENDENT_AMBULATORY_CARE_PROVIDER_SITE_OTHER): Payer: Federal, State, Local not specified - PPO | Admitting: Psychology

## 2013-08-10 DIAGNOSIS — F4322 Adjustment disorder with anxiety: Secondary | ICD-10-CM

## 2013-08-16 ENCOUNTER — Ambulatory Visit (INDEPENDENT_AMBULATORY_CARE_PROVIDER_SITE_OTHER): Payer: Federal, State, Local not specified - PPO | Admitting: Psychology

## 2013-08-16 DIAGNOSIS — F4322 Adjustment disorder with anxiety: Secondary | ICD-10-CM

## 2013-08-24 ENCOUNTER — Ambulatory Visit (INDEPENDENT_AMBULATORY_CARE_PROVIDER_SITE_OTHER): Payer: Federal, State, Local not specified - PPO | Admitting: Psychology

## 2013-08-24 DIAGNOSIS — F4322 Adjustment disorder with anxiety: Secondary | ICD-10-CM

## 2013-08-30 ENCOUNTER — Ambulatory Visit: Payer: Federal, State, Local not specified - PPO | Admitting: Psychology

## 2013-09-09 IMAGING — CR DG CHEST 2V
2 series · 2 of 2 positions shown · non-contrast
Comparison: 06/05/2010

CLINICAL DATA: Previous CABG.  Preoperative respiratory exam for
spine surgery.

CHEST - 2 VIEW

[view not recorded (1 of 2)]
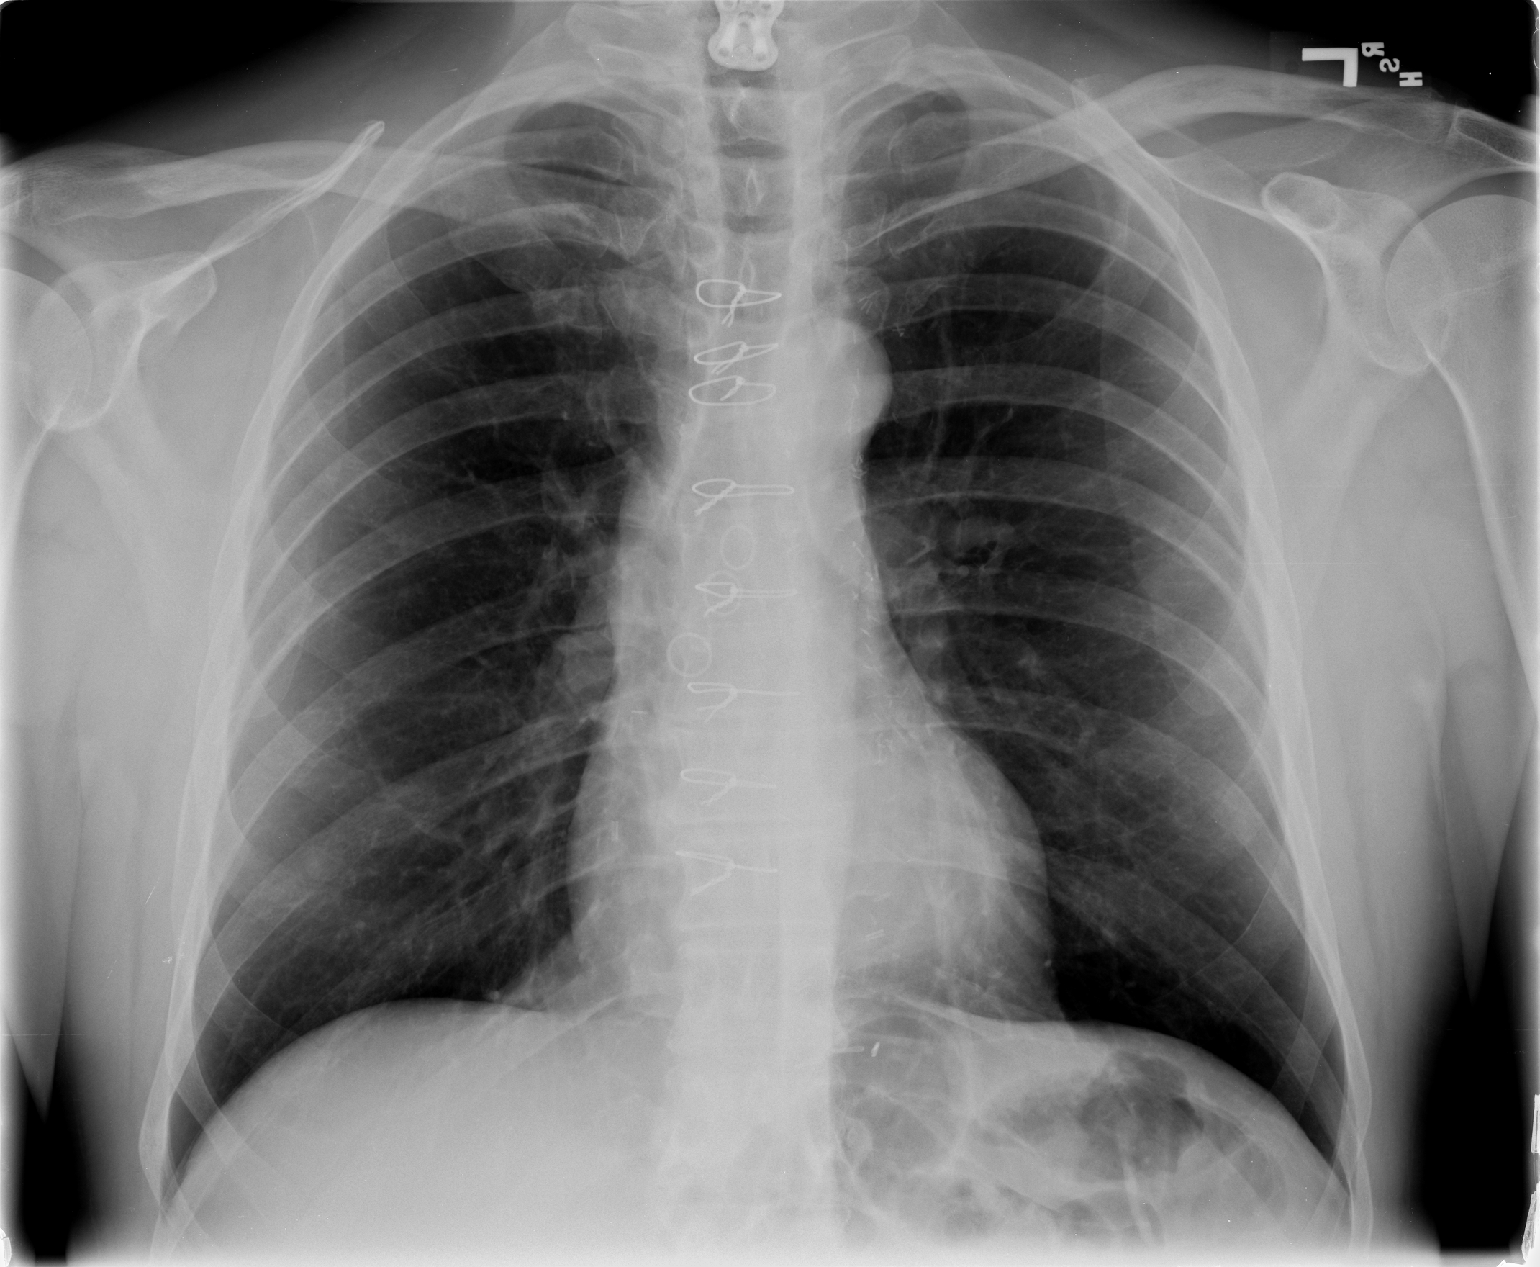

[view not recorded (2 of 2)]
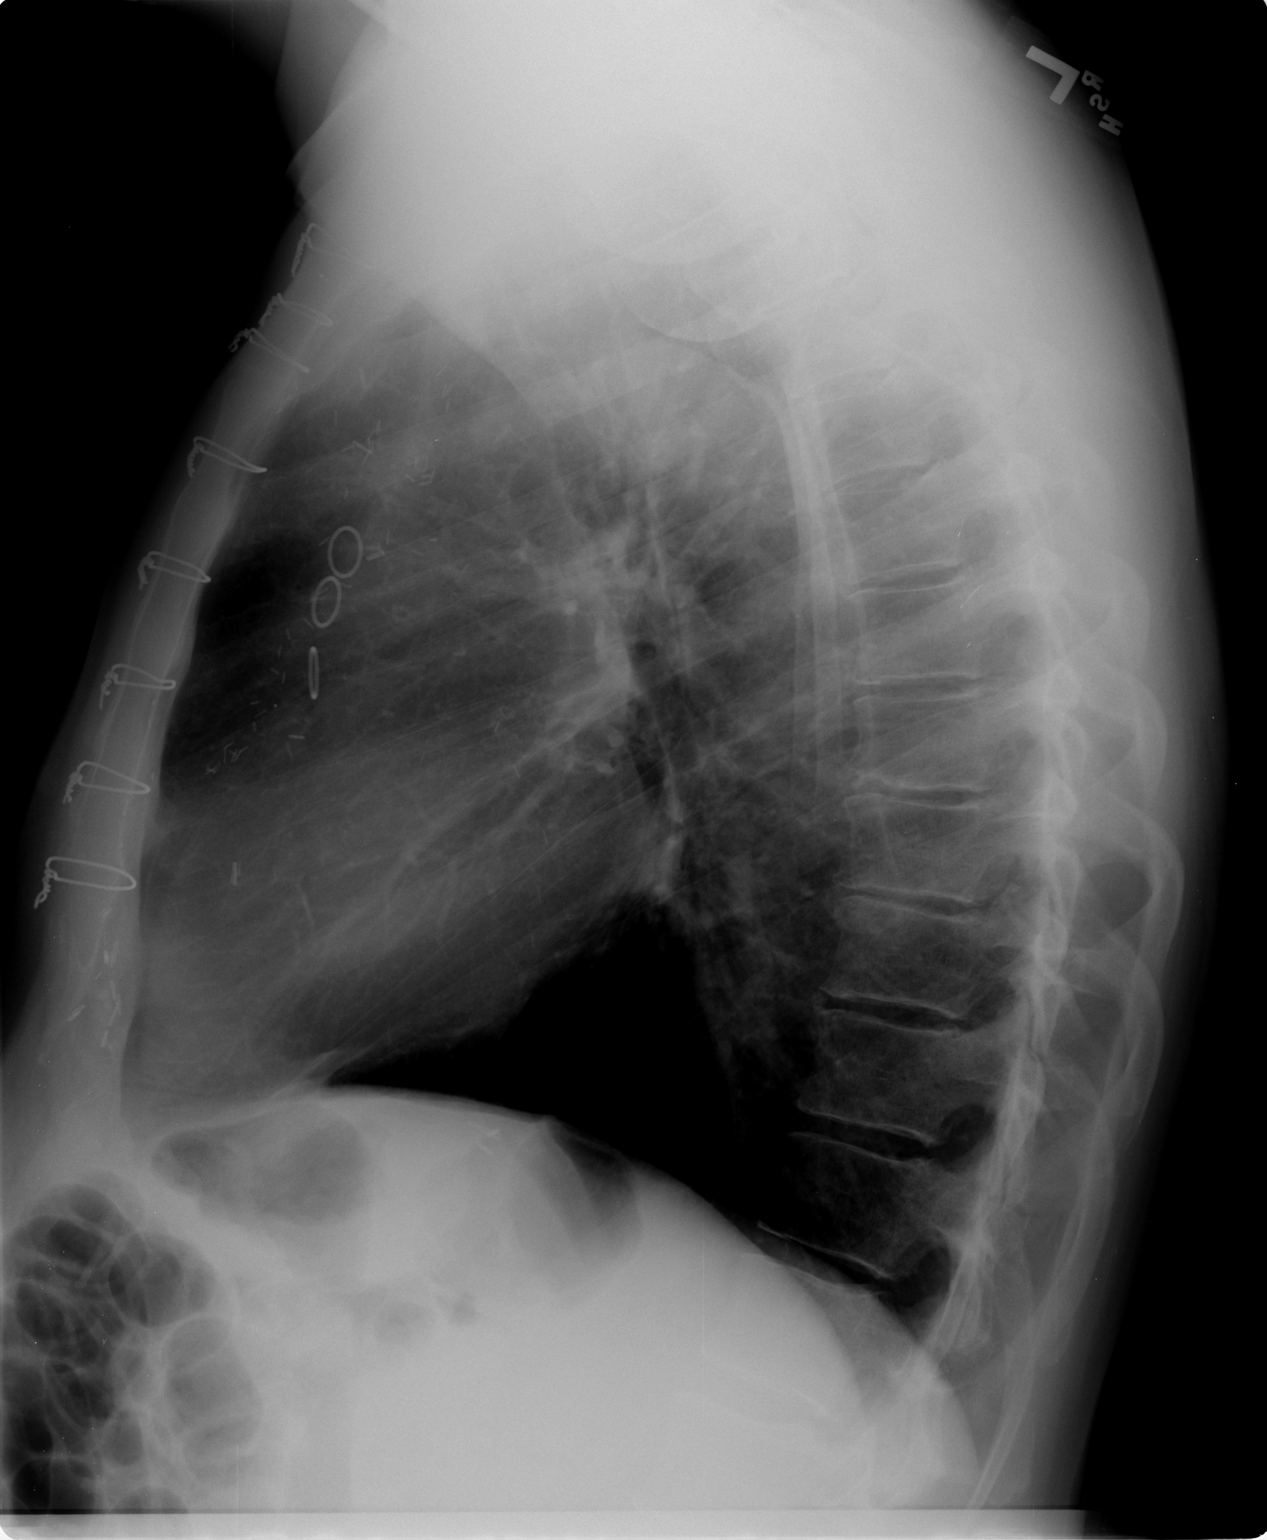

[2 of 2 positions shown; findings below may reference images not displayed]

FINDINGS: Heart size is normal.  There has been previous median
sternotomy and CABG.  Mediastinal shadows are otherwise normal.
Lungs are clear.  No effusions.  No significant bony finding.
IMPRESSION: No active disease.  Previous CABG.

## 2013-09-13 ENCOUNTER — Ambulatory Visit (INDEPENDENT_AMBULATORY_CARE_PROVIDER_SITE_OTHER): Payer: Federal, State, Local not specified - PPO | Admitting: Cardiology

## 2013-09-13 ENCOUNTER — Encounter: Payer: Self-pay | Admitting: Cardiology

## 2013-09-13 VITALS — BP 120/78 | HR 59 | Ht 75.0 in | Wt 197.0 lb

## 2013-09-13 DIAGNOSIS — I714 Abdominal aortic aneurysm, without rupture, unspecified: Secondary | ICD-10-CM

## 2013-09-13 DIAGNOSIS — I25810 Atherosclerosis of coronary artery bypass graft(s) without angina pectoris: Secondary | ICD-10-CM

## 2013-09-13 DIAGNOSIS — E785 Hyperlipidemia, unspecified: Secondary | ICD-10-CM

## 2013-09-13 NOTE — Patient Instructions (Addendum)
Your physician has requested that you have an abdominal aorta duplex. During this test, an ultrasound is used to evaluate the aorta. Allow 30 minutes for this exam. Do not eat after midnight the day before and avoid carbonated beverages MARCH 2015  Your physician wants you to follow-up with Dr Shirlee Latch : March 2015 a few days after you have the abdominal aorta duplex. You will receive a reminder letter in the mail two months in advance. If you don't receive a letter, please call our office to schedule the follow-up appointment.

## 2013-09-13 NOTE — Progress Notes (Signed)
Patient ID: [name], male   DOB: 1951/04/25, 62 y.o.   MRN: [id number] PCP: Dr. Laury Axon  62 yo with history of CAD s/p CABG and AAA presents for cardiology followup.  He has been seen by Dr. [name] in the past and is seen by me for the first time today.  He had CABG in 2002.  Last cath was in 5/12.  EF was 50% on LV-gram, and 3/5 grafts were patent.   Recently, he has been doing well.  No exertional chest pain.  No exertional dyspnea.  He is retired from the [employer] but keeps active.  BP is controlled.  Main complaint is low back pain.   ECG: NSR, 1st degree AV block, nonspecific T wave changes  Labs (4/14): K 3.6, creatinine 1.2, LDL 82, HDL 35  PMH: 1. CAD: s/p CABG 2002. LHC (5/12) with EF 50%; LIMA-LAD atretic, SVG-OM patent, SVG-ramus patent, seq SVG-RCA and PDA with RCA arm patent and PDA arm occluded.   2. HTN 3. AAA: 3.7 cm on [place] in 3/14.  4. OSA 5. GERD 6. Low back pain s/p surgery 7. Hyperlipidemia  SH: Married, 5 kids, retired from the [employer], lives [place] [address]   FH: CAD  ROS: All systems reviewed and negative except as per HPI.   Current Outpatient Prescriptions  Medication Sig Dispense Refill  . aspirin 81 MG EC tablet Take 81 mg by mouth at bedtime.       . celecoxib (CELEBREX) 200 MG capsule Take 200 mg by mouth daily.       . clonazePAM (KLONOPIN) 0.5 MG tablet Take 1-3 tablets (0.5-1.5 mg total) by mouth at bedtime. 3 tabs qhs except for the nights where Remeron is taken, then 1 tab qhs.  270 tablet  3  . Coenzyme Q10 (CO Q 10) 100 MG CAPS Take 100 mg by mouth daily.      . famotidine (PEPCID) 20 MG tablet Take 20 mg by mouth daily.      . fenofibrate 160 MG tablet Take 1 tablet (160 mg total) by mouth at bedtime.  90 tablet  3  . gabapentin (NEURONTIN) 100 MG capsule Take one in the morning one in the afternoon and three at night, for insomnia, pain and tics.      Marland Kitchen HYDROcodone-acetaminophen (NORCO) 10-325 MG per tablet Take 1 tablet by  mouth every 4 (four) hours as needed.      . metoprolol succinate (TOPROL-XL) 25 MG 24 hr tablet Take 1 tablet (25 mg total) by mouth every morning.  90 tablet  3  . mirtazapine (REMERON) 45 MG tablet Take 1 tablet (45 mg total) by mouth at bedtime.  90 tablet  3  . nitroGLYCERIN (NITROSTAT) 0.4 MG SL tablet Place 1 tablet (0.4 mg total) under the tongue every 5 (five) minutes as needed. for chest pain  25 tablet  3  . OMEGA-3 KRILL OIL 300 MG CAPS Take 300 mg by mouth daily.       . pravastatin (PRAVACHOL) 80 MG tablet Take 1 tablet (80 mg total) by mouth at bedtime.  90 tablet  3  . ramipril (ALTACE) 5 MG capsule Take 1 capsule (5 mg total) by mouth 2 (two) times daily.  180 capsule  3  . tiZANidine (ZANAFLEX) 4 MG tablet Take 4 mg by mouth 2 (two) times daily as needed. For muscle spasms      . Turmeric 500 MG CAPS Take 500 mg by mouth daily.       Marland Kitchen  valACYclovir (VALTREX) 500 MG tablet Take 500 mg by mouth 2 (two) times daily as needed. For flare up  Of cold sore      . triamcinolone cream (KENALOG) 0.1 %        No current facility-administered medications for this visit.    BP 120/78  Pulse 59  Ht 6\' 3"  (1.905 m)  Wt 89.359 kg (197 lb)  BMI 24.62 kg/m2 General: NAD Neck: No JVD, no thyromegaly or thyroid nodule.  Lungs: Clear to auscultation bilaterally with normal respiratory effort. CV: Nondisplaced PMI.  Heart regular S1/S2, no S3/S4, no murmur.  No peripheral edema.  No carotid bruit.  Normal pedal pulses.  Abdomen: Soft, nontender, no hepatosplenomegaly, no distention.  Skin: Intact without lesions or rashes.  Neurologic: Alert and oriented x 3.  Psych: Normal affect. Extremities: No clubbing or cyanosis.  HEENT: Facial tic  Assessment/Plan:  1. CAD: s/p CABG.  Last cath in 5/12 showed 3/5 patent grafts. No ischemic symptoms.  Continue ASA 81, statin, ramipril, and Toprol XL.  2. AAA: Patient will need followup abdominal US in 3/15.  I will arrange.  3. Hyperlipidemia:  Lipids were ok in 4/14.   Followup in 6 months.   Marca Ancona 09/13/2013

## 2013-09-15 IMAGING — RF DG C-ARM GT 120 MIN
1 series · 5 of 5 positions shown · non-contrast
Comparison: MRI lumbar spine 07/07/2012.

CLINICAL DATA: L3-4 XLIF.  L4-5 TLIF

LUMBAR SPINE - COMPLETE 4+ VIEW
TECHNIQUE: Intraoperative fluoro spot images are provided.

[Series 1: run · 5 of 5 slices shown]
[im 1/5]
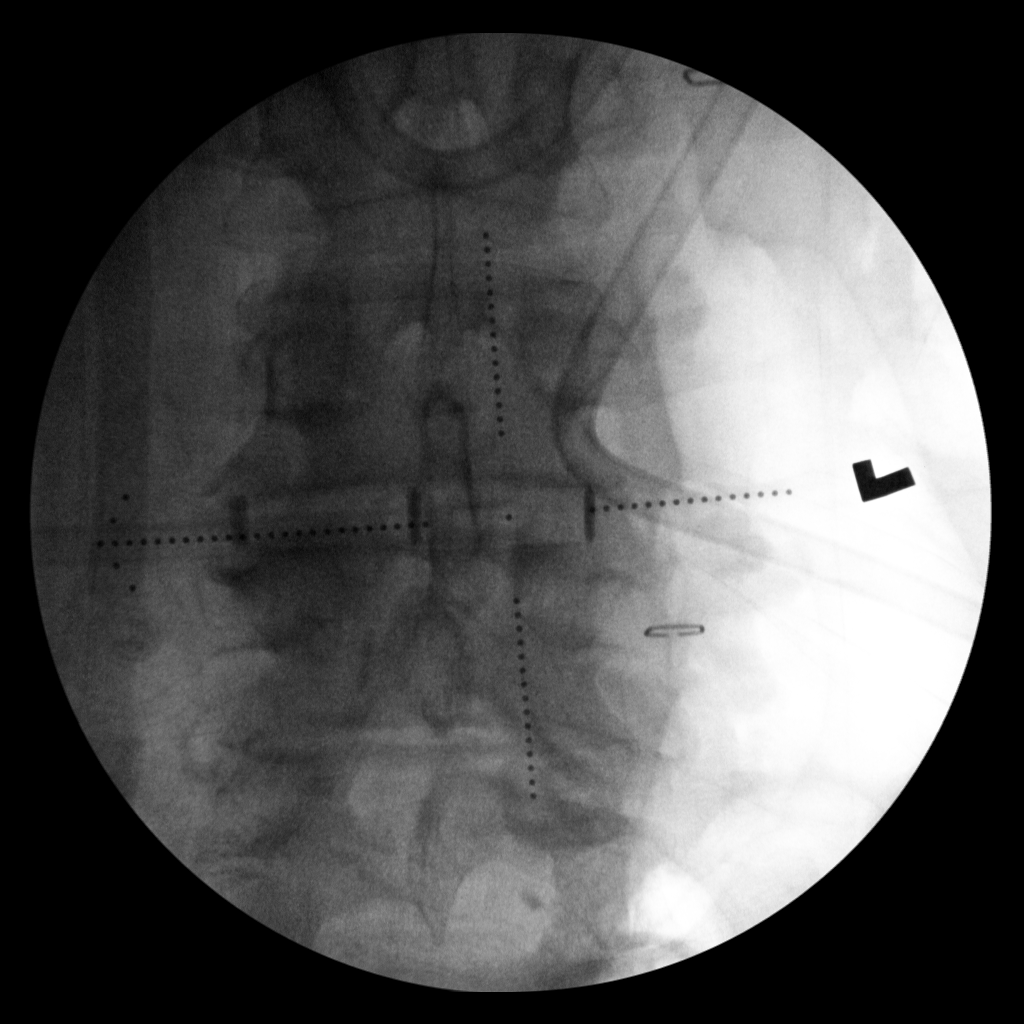
[im 2/5]
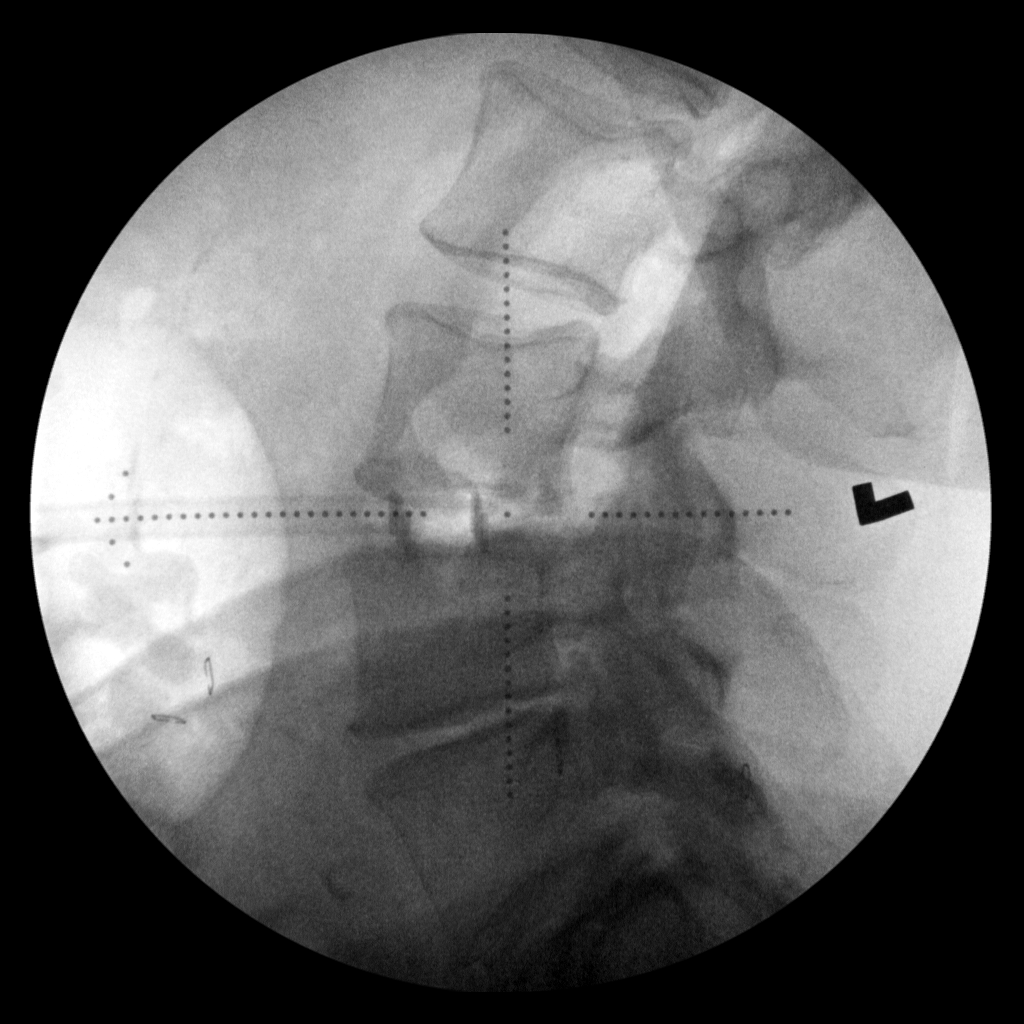
[im 3/5]
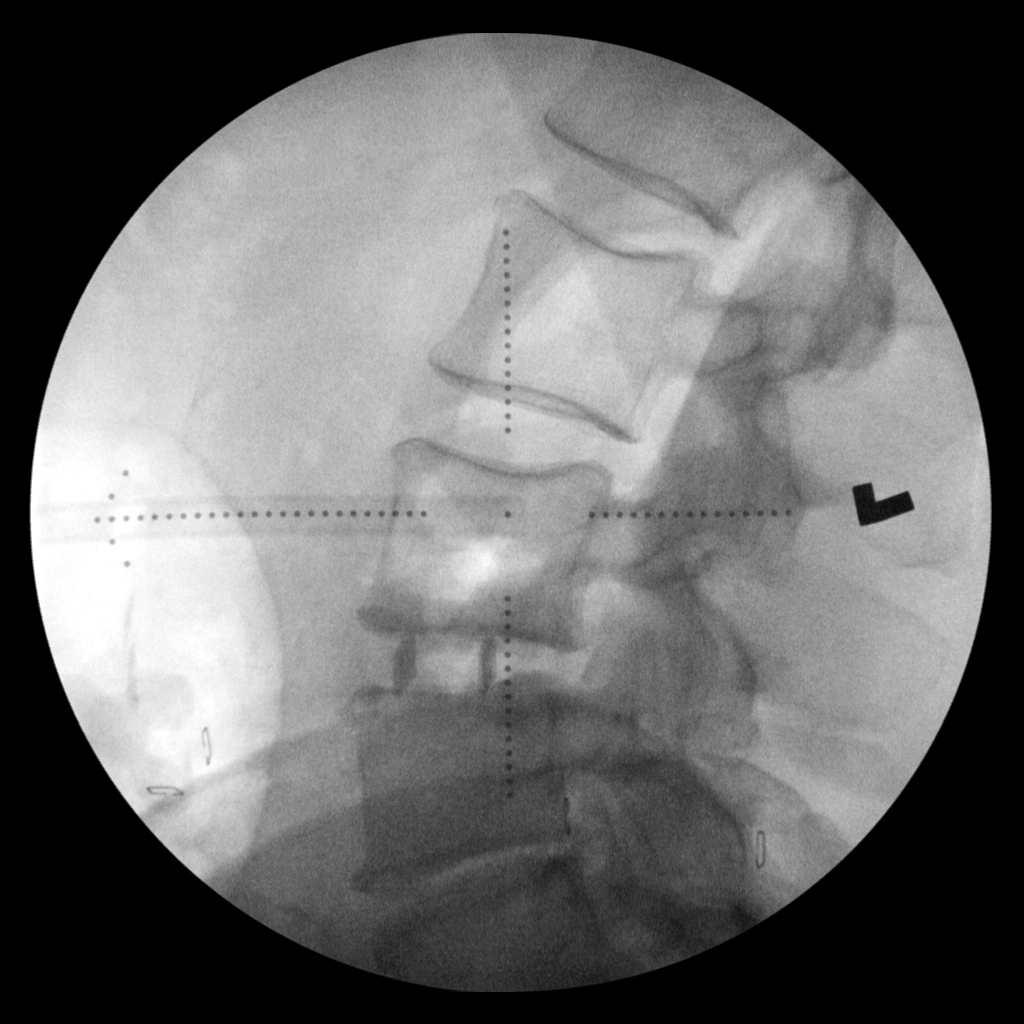
[im 4/5]
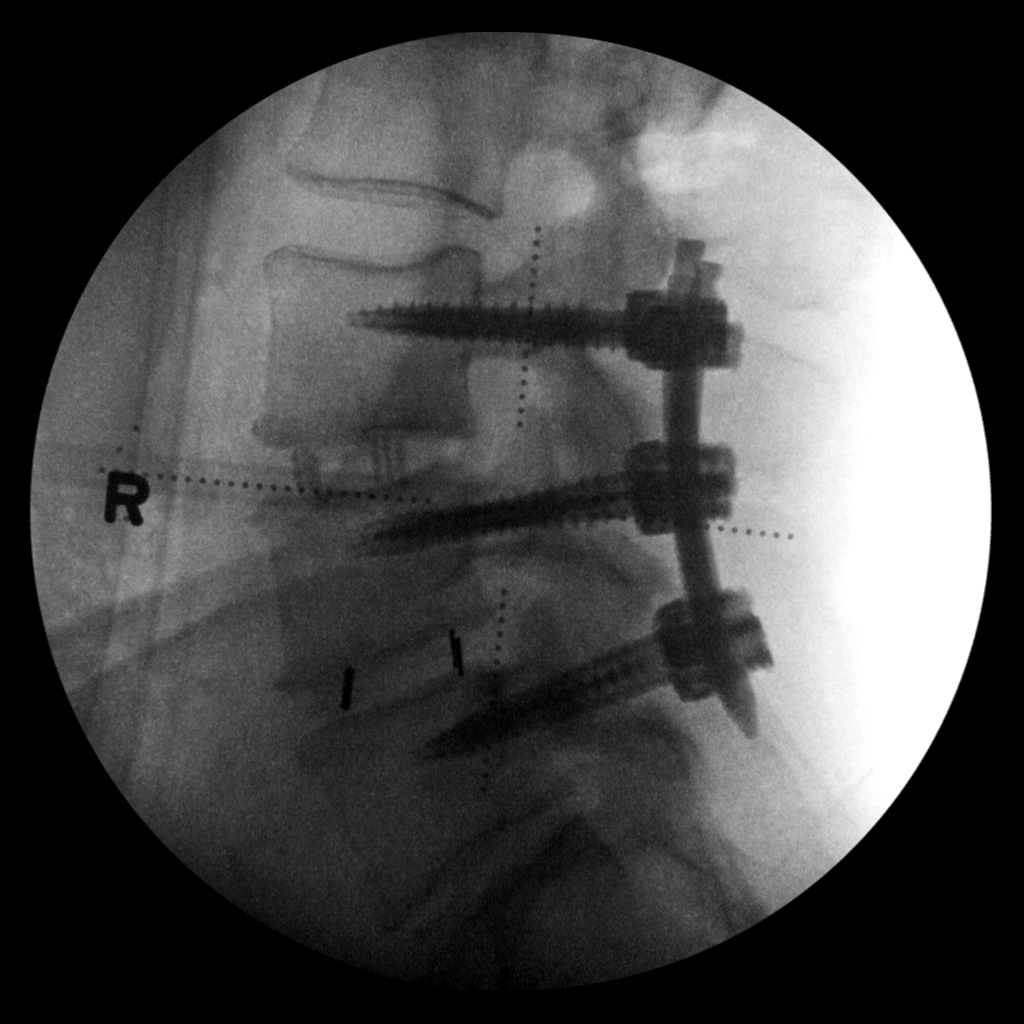
[im 5/5]
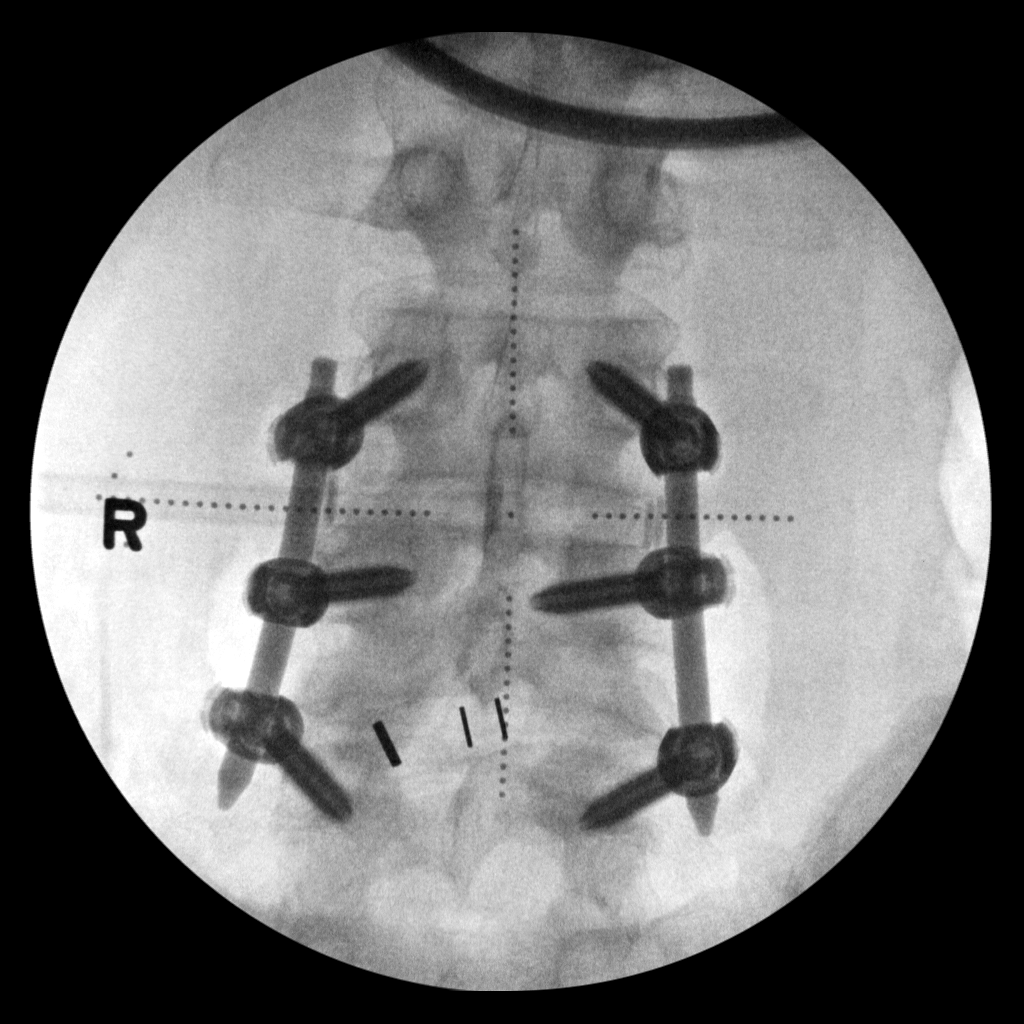

[5 of 5 positions shown; findings below may reference images not displayed]

FINDINGS: The patient is status post discectomy and fusion at L3-4
and L4-5.  The disc heights have been restored.  Alignment is
anatomic.
IMPRESSION: Status post discectomy and fixation at L3-4 and L4-5 without
radiographic evidence for complication.

## 2013-09-26 ENCOUNTER — Other Ambulatory Visit: Payer: Self-pay | Admitting: Cardiology

## 2013-10-19 ENCOUNTER — Other Ambulatory Visit: Payer: Self-pay

## 2013-11-25 ENCOUNTER — Encounter: Payer: Self-pay | Admitting: Family Medicine

## 2013-11-27 MED ORDER — CELECOXIB 200 MG PO CAPS: 200.0000 mg | ORAL_CAPSULE | Freq: Every day | ORAL | Status: DC

## 2013-11-27 NOTE — Telephone Encounter (Signed)
Please advise if it is ok to send 2 po qd, this is not the direction on the system. Please advise     KP

## 2013-11-30 ENCOUNTER — Telehealth: Payer: Self-pay | Admitting: Cardiology

## 2013-11-30 DIAGNOSIS — E785 Hyperlipidemia, unspecified: Secondary | ICD-10-CM

## 2013-11-30 NOTE — Telephone Encounter (Signed)
Pt advised lipid profile has been ordered by Dr Shirlee Latch.

## 2013-11-30 NOTE — Telephone Encounter (Signed)
New Problem:  Pt is requesting Dr. Shirlee Latch order a lipid check with his Physical at Dr. Ernst Spell office [facility]... PT has not set up an appt yet but is hoping to have a physical in January or February. Pt would like a call back from Thurston Hole to let him know if Dr. Shirlee Latch can put that lab order in.

## 2013-12-15 ENCOUNTER — Ambulatory Visit: Payer: Federal, State, Local not specified - PPO

## 2013-12-18 ENCOUNTER — Other Ambulatory Visit: Payer: Self-pay

## 2013-12-18 DIAGNOSIS — G4700 Insomnia, unspecified: Secondary | ICD-10-CM

## 2013-12-18 DIAGNOSIS — F959 Tic disorder, unspecified: Secondary | ICD-10-CM

## 2013-12-18 MED ORDER — CLONAZEPAM 0.5 MG PO TABS: 0.5000 mg | ORAL_TABLET | Freq: Every day | ORAL | Status: DC

## 2013-12-18 MED ORDER — MIRTAZAPINE 45 MG PO TABS: 45.0000 mg | ORAL_TABLET | Freq: Every day | ORAL | Status: DC

## 2013-12-19 ENCOUNTER — Encounter: Payer: Self-pay | Admitting: Family Medicine

## 2014-01-24 ENCOUNTER — Ambulatory Visit: Payer: Federal, State, Local not specified - PPO | Admitting: Neurology

## 2014-02-15 ENCOUNTER — Other Ambulatory Visit: Payer: Self-pay | Admitting: *Deleted

## 2014-02-15 DIAGNOSIS — E785 Hyperlipidemia, unspecified: Secondary | ICD-10-CM

## 2014-02-16 MED ORDER — FENOFIBRATE 160 MG PO TABS: 160.0000 mg | ORAL_TABLET | Freq: Every day | ORAL | Status: DC

## 2014-02-16 MED ORDER — PRAVASTATIN SODIUM 80 MG PO TABS
80.0000 mg | ORAL_TABLET | Freq: Every day | ORAL | Status: DC
Start: ? — End: 2014-03-22

## 2014-02-26 ENCOUNTER — Encounter (HOSPITAL_COMMUNITY): Payer: Federal, State, Local not specified - PPO

## 2014-02-26 ENCOUNTER — Ambulatory Visit (HOSPITAL_COMMUNITY): Payer: Federal, State, Local not specified - PPO | Attending: Cardiology | Admitting: *Deleted

## 2014-02-26 ENCOUNTER — Other Ambulatory Visit (INDEPENDENT_AMBULATORY_CARE_PROVIDER_SITE_OTHER): Payer: Federal, State, Local not specified - PPO

## 2014-02-26 ENCOUNTER — Telehealth: Payer: Self-pay | Admitting: Cardiology

## 2014-02-26 DIAGNOSIS — Z87891 Personal history of nicotine dependence: Secondary | ICD-10-CM | POA: Insufficient documentation

## 2014-02-26 DIAGNOSIS — E785 Hyperlipidemia, unspecified: Secondary | ICD-10-CM

## 2014-02-26 DIAGNOSIS — I714 Abdominal aortic aneurysm, without rupture, unspecified: Secondary | ICD-10-CM

## 2014-02-26 DIAGNOSIS — I2510 Atherosclerotic heart disease of native coronary artery without angina pectoris: Secondary | ICD-10-CM | POA: Insufficient documentation

## 2014-02-26 DIAGNOSIS — Z951 Presence of aortocoronary bypass graft: Secondary | ICD-10-CM | POA: Insufficient documentation

## 2014-02-26 DIAGNOSIS — I10 Essential (primary) hypertension: Secondary | ICD-10-CM | POA: Insufficient documentation

## 2014-02-26 DIAGNOSIS — I25810 Atherosclerosis of coronary artery bypass graft(s) without angina pectoris: Secondary | ICD-10-CM

## 2014-02-26 LAB — LIPID PANEL
Cholesterol: 150 mg/dL (ref 0–200)
HDL: 40.9 mg/dL (ref >39.00)
LDL Cholesterol: 93 mg/dL (ref 0–99)
Total CHOL/HDL Ratio: 4
Triglycerides: 82 mg/dL (ref 0.0–149.0)
VLDL: 16.4 mg/dL (ref 0.0–40.0)

## 2014-02-26 NOTE — Telephone Encounter (Signed)
Spoke with wife and appt moved to 03/30/14 from May.

## 2014-02-26 NOTE — Progress Notes (Signed)
Visceral Duplex Aorta complete

## 2014-02-26 NOTE — Telephone Encounter (Signed)
Patient is scheduled to see Dr. Aundra Dubin 04-20-14 and wants to know if he can come in April if that is not too soon.  He just had his lab work and his AAA screen today.

## 2014-03-05 ENCOUNTER — Other Ambulatory Visit: Payer: Self-pay | Admitting: *Deleted

## 2014-03-05 ENCOUNTER — Telehealth: Payer: Self-pay | Admitting: Cardiology

## 2014-03-05 DIAGNOSIS — I25810 Atherosclerosis of coronary artery bypass graft(s) without angina pectoris: Secondary | ICD-10-CM

## 2014-03-05 DIAGNOSIS — E785 Hyperlipidemia, unspecified: Secondary | ICD-10-CM

## 2014-03-05 MED ORDER — ATORVASTATIN CALCIUM 20 MG PO TABS: 20.0000 mg | ORAL_TABLET | Freq: Every day | ORAL | Status: DC

## 2014-03-05 NOTE — Telephone Encounter (Signed)
New message    Returning Anne's call---OK to leave a message on the vm

## 2014-03-05 NOTE — Telephone Encounter (Signed)
Spoke with patient's wife about recent test results.

## 2014-03-06 ENCOUNTER — Telehealth: Payer: Self-pay | Admitting: Cardiology

## 2014-03-06 ENCOUNTER — Other Ambulatory Visit: Payer: Self-pay | Admitting: *Deleted

## 2014-03-06 NOTE — Telephone Encounter (Signed)
Spoke with patient. Pt declines to change from pravastatin to atorvastatin. He is very adamant about not taking atorvastatin or crestor.

## 2014-03-06 NOTE — Telephone Encounter (Signed)
OK.  Continue pravastatin.

## 2014-03-06 NOTE — Telephone Encounter (Signed)
New message    Talk to Lelon Frohlich about changing a cholesterol medication---he said Dr Aundra Dubin want him to change his cholesterol medication but he want to know why.  It current cholesterol medication is working

## 2014-03-08 ENCOUNTER — Ambulatory Visit: Payer: Federal, State, Local not specified - PPO | Admitting: Cardiology

## 2014-03-19 ENCOUNTER — Other Ambulatory Visit: Payer: Self-pay | Admitting: *Deleted

## 2014-03-19 ENCOUNTER — Telehealth: Payer: Self-pay | Admitting: Cardiology

## 2014-03-19 NOTE — Telephone Encounter (Signed)
New message     Pt need generic lipitor 20mg  [facility] [address] [phone].  Pt want a 90day supply.  [facility] [facility] 90da supply.  Pt also taking fenofibrate. Pt apologizes for the mixup earlier???

## 2014-03-21 ENCOUNTER — Telehealth: Payer: Self-pay

## 2014-03-21 NOTE — Telephone Encounter (Signed)
Medication List and allergies:  Updated and Reviewed  90 day supply/mail order: [id number] [place] [place] - [id number] Local prescriptions:  [facility] [place] [place]  Immunization due:  UTD  A/P: No changes to personal, family or [place] Flu- 12/03/11 Tdap- 06/10/12 Shingles- 06/26/10 CCS- 04/13/11- Benign polyp, moderate diverticulosis; repeat in 5 years (03/2016) PSA- 03/02/11  0.63   To discuss with provider: Dr. Aundra Dubin spoke to patient about switching pravastatin to Lipitor.

## 2014-03-22 ENCOUNTER — Encounter: Payer: Self-pay | Admitting: Family Medicine

## 2014-03-22 ENCOUNTER — Ambulatory Visit (INDEPENDENT_AMBULATORY_CARE_PROVIDER_SITE_OTHER): Payer: Federal, State, Local not specified - PPO | Admitting: Family Medicine

## 2014-03-22 VITALS — BP 114/74 | HR 50 | Temp 98.1°F | Ht 74.0 in | Wt 188.0 lb

## 2014-03-22 DIAGNOSIS — G894 Chronic pain syndrome: Secondary | ICD-10-CM

## 2014-03-22 DIAGNOSIS — I10 Essential (primary) hypertension: Secondary | ICD-10-CM

## 2014-03-22 DIAGNOSIS — Z0000 Encounter for general adult medical examination without abnormal findings: Secondary | ICD-10-CM

## 2014-03-22 DIAGNOSIS — E785 Hyperlipidemia, unspecified: Secondary | ICD-10-CM

## 2014-03-22 LAB — CBC WITH DIFFERENTIAL/PLATELET
Basophils Absolute: 0 10*3/uL (ref 0.0–0.1)
Basophils Relative: 0.8 % (ref 0.0–3.0)
Eosinophils Absolute: 0.1 10*3/uL (ref 0.0–0.7)
Eosinophils Relative: 1.8 % (ref 0.0–5.0)
HCT: 36.4 % — ABNORMAL LOW (ref 39.0–52.0)
Hemoglobin: 12.4 g/dL — ABNORMAL LOW (ref 13.0–17.0)
Lymphocytes Relative: 39.4 % (ref 12.0–46.0)
Lymphs Abs: 1.7 10*3/uL (ref 0.7–4.0)
MCHC: 34.1 g/dL (ref 30.0–36.0)
MCV: 95.6 fl (ref 78.0–100.0)
Monocytes Absolute: 0.3 10*3/uL (ref 0.1–1.0)
Monocytes Relative: 7.1 % (ref 3.0–12.0)
Neutro Abs: 2.2 10*3/uL (ref 1.4–7.7)
Neutrophils Relative %: 50.9 % (ref 43.0–77.0)
Platelets: 212 10*3/uL (ref 150.0–400.0)
RBC: 3.81 Mil/uL — ABNORMAL LOW (ref 4.22–5.81)
RDW: 13 % (ref 11.5–14.6)
WBC: 4.4 10*3/uL — ABNORMAL LOW (ref 4.5–10.5)

## 2014-03-22 LAB — POCT URINALYSIS DIPSTICK
Bilirubin, UA: NEGATIVE
Blood, UA: NEGATIVE
Glucose, UA: NEGATIVE
Ketones, UA: NEGATIVE
Leukocytes, UA: NEGATIVE
Nitrite, UA: NEGATIVE
Protein, UA: NEGATIVE
Spec Grav, UA: 1.015
Urobilinogen, UA: 0.2
pH, UA: 6

## 2014-03-22 LAB — BASIC METABOLIC PANEL
BUN: 17 mg/dL (ref 6–23)
CO2: 29 mEq/L (ref 19–32)
Calcium: 9.6 mg/dL (ref 8.4–10.5)
Chloride: 103 mEq/L (ref 96–112)
Creatinine, Ser: 1.1 mg/dL (ref 0.4–1.5)
GFR: 75.21 mL/min (ref >60.00)
Glucose, Bld: 89 mg/dL (ref 70–99)
Potassium: 3.8 mEq/L (ref 3.5–5.1)
Sodium: 139 mEq/L (ref 135–145)

## 2014-03-22 LAB — TSH: TSH: 2.1 u[IU]/mL (ref 0.35–5.50)

## 2014-03-22 LAB — PSA: PSA: 1.5 ng/mL (ref 0.10–4.00)

## 2014-03-22 MED ORDER — FENOFIBRATE 160 MG PO TABS: 160.0000 mg | ORAL_TABLET | Freq: Every day | ORAL | Status: DC

## 2014-03-22 MED ORDER — RAMIPRIL 5 MG PO CAPS: 5.0000 mg | ORAL_CAPSULE | Freq: Two times a day (BID) | ORAL | Status: DC

## 2014-03-22 MED ORDER — METOPROLOL SUCCINATE ER 25 MG PO TB24: 25.0000 mg | ORAL_TABLET | Freq: Every morning | ORAL | Status: DC

## 2014-03-22 MED ORDER — ATORVASTATIN CALCIUM 20 MG PO TABS: 20.0000 mg | ORAL_TABLET | Freq: Every day | ORAL | Status: DC

## 2014-03-22 MED ORDER — TRAMADOL HCL 50 MG PO TABS: 50.0000 mg | ORAL_TABLET | Freq: Three times a day (TID) | ORAL | Status: DC | PRN
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
## 2014-03-22 NOTE — Progress Notes (Signed)
Pre visit review using our clinic review tool, if applicable. No additional management support is needed unless otherwise documented below in the visit note. 

## 2014-03-22 NOTE — Patient Instructions (Signed)
Preventive Care for Adults, Male A healthy lifestyle and preventive care can promote health and wellness. Preventive health guidelines for men include the following key practices:  A routine yearly physical is a good way to check with your health care provider about your health and preventative screening. It is a chance to share any concerns and updates on your health and to receive a thorough exam.  Visit your dentist for a routine exam and preventative care every 6 months. Brush your teeth twice a day and floss once a day. Good oral hygiene prevents tooth decay and gum disease.  The frequency of eye exams is based on your age, health, family medical history, use of contact lenses, and other factors. Follow your health care provider's recommendations for frequency of eye exams.  Eat a healthy diet. Foods such as vegetables, fruits, whole grains, low-fat dairy products, and lean protein foods contain the nutrients you need without too many calories. Decrease your intake of foods high in solid fats, added sugars, and salt. Eat the right amount of calories for you.Get information about a proper diet from your health care provider, if necessary.  Regular physical exercise is one of the most important things you can do for your health. Most adults should get at least 150 minutes of moderate-intensity exercise (any activity that increases your heart rate and causes you to sweat) each week. In addition, most adults need muscle-strengthening exercises on 2 or more days a week.  Maintain a healthy weight. The body mass index (BMI) is a screening tool to identify possible weight problems. It provides an estimate of body fat based on height and weight. Your health care provider can find your BMI and can help you achieve or maintain a healthy weight.For adults 20 years and older:  A BMI below 18.5 is considered underweight.  A BMI of 18.5 to 24.9 is normal.  A BMI of 25 to 29.9 is considered  overweight.  A BMI of 30 and above is considered obese.  Maintain normal blood lipids and cholesterol levels by exercising and minimizing your intake of saturated fat. Eat a balanced diet with plenty of fruit and vegetables. Blood tests for lipids and cholesterol should begin at age 42 and be repeated every 5 years. If your lipid or cholesterol levels are high, you are over 50, or you are at high risk for heart disease, you may need your cholesterol levels checked more frequently.Ongoing high lipid and cholesterol levels should be treated with medicines if diet and exercise are not working.  If you smoke, find out from your health care provider how to quit. If you do not use tobacco, do not start.  Lung cancer screening is recommended for adults aged 24 80 years who are at high risk for developing lung cancer because of a history of smoking. A yearly low-dose CT scan of the lungs is recommended for people who have at least a 30-pack-year history of smoking and are a current smoker or have quit within the past 15 years. A pack year of smoking is smoking an average of 1 pack of cigarettes a day for 1 year (for example: 1 pack a day for 30 years or 2 packs a day for 15 years). Yearly screening should continue until the smoker has stopped smoking for at least 15 years. Yearly screening should be stopped for people who develop a health problem that would prevent them from having lung cancer treatment.  If you choose to drink alcohol, do not have  more than 2 drinks per day. One drink is considered to be 12 ounces (355 mL) of beer, 5 ounces (148 mL) of wine, or 1.5 ounces (44 mL) of liquor.  Avoid use of street drugs. Do not share needles with anyone. Ask for help if you need support or instructions about stopping the use of drugs.  High blood pressure causes heart disease and increases the risk of stroke. Your blood pressure should be checked at least every 1 2 years. Ongoing high blood pressure should be  treated with medicines, if weight loss and exercise are not effective.  If you are 75 62 years old, ask your health care provider if you should take aspirin to prevent heart disease.  Diabetes screening involves taking a blood sample to check your fasting blood sugar level. This should be done once every 3 years, after age 19, if you are within normal weight and without risk factors for diabetes. Testing should be considered at a younger age or be carried out more frequently if you are overweight and have at least 1 risk factor for diabetes.  Colorectal cancer can be detected and often prevented. Most routine colorectal cancer screening begins at the age of 47 and continues through age 80. However, your health care provider may recommend screening at an earlier age if you have risk factors for colon cancer. On a yearly basis, your health care provider may provide home test kits to check for hidden blood in the stool. Use of a small camera at the end of a tube to directly examine the colon (sigmoidoscopy or colonoscopy) can detect the earliest forms of colorectal cancer. Talk to your health care provider about this at age 66, when routine screening begins. Direct exam of the colon should be repeated every 5 10 years through age 19, unless early forms of precancerous polyps or small growths are found.  People who are at an increased risk for hepatitis B should be screened for this virus. You are considered at high risk for hepatitis B if:  You were born in a country where hepatitis B occurs often. Talk with your health care provider about which countries are considered high-risk.  Your parents were born in a high-risk country and you have not received a shot to protect against hepatitis B (hepatitis B vaccine).  You have HIV or AIDS.  You use needles to inject street drugs.  You live with, or have sex with, someone who has hepatitis B.  You are a man who has sex with other men (MSM).  You get  hemodialysis treatment.  You take certain medicines for conditions such as cancer, organ transplantation, and autoimmune conditions.  Hepatitis C blood testing is recommended for all people born from [birth year] through 1965 and any individual with known risks for hepatitis C.  Practice safe sex. Use condoms and avoid high-risk sexual practices to reduce the spread of sexually transmitted infections (STIs). STIs include gonorrhea, chlamydia, syphilis, trichomonas, herpes, HPV, and human immunodeficiency virus (HIV). Herpes, HIV, and HPV are viral illnesses that have no cure. They can result in disability, cancer, and death.  A one-time screening for abdominal aortic aneurysm (AAA) and surgical repair of large AAAs by ultrasound are recommended for men ages 94 to 74 years who are current or former smokers.  Healthy men should no longer receive prostate-specific antigen (PSA) blood tests as part of routine cancer screening. Talk with your health care provider about prostate cancer screening.  Testicular cancer screening is not recommended  for adult males who have no symptoms. Screening includes self-exam, a health care provider exam, and other screening tests. Consult with your health care provider about any symptoms you have or any concerns you have about testicular cancer.  Use sunscreen. Apply sunscreen liberally and repeatedly throughout the day. You should seek shade when your shadow is shorter than you. Protect yourself by wearing long sleeves, pants, a wide-brimmed hat, and sunglasses year round, whenever you are outdoors.  Once a month, do a whole-body skin exam, using a mirror to look at the skin on your back. Tell your health care provider about new moles, moles that have irregular borders, moles that are larger than a pencil eraser, or moles that have changed in shape or color.  Stay current with required vaccines (immunizations).  Influenza vaccine. All adults should be immunized every  year.  Tetanus, diphtheria, and acellular pertussis (Td, Tdap) vaccine. An adult who has not previously received Tdap or who does not know his vaccine status should receive 1 dose of Tdap. This initial dose should be followed by tetanus and diphtheria toxoids (Td) booster doses every 10 years. Adults with an unknown or incomplete history of completing a 3-dose immunization series with Td-containing vaccines should begin or complete a primary immunization series including a Tdap dose. Adults should receive a Td booster every 10 years.  Varicella vaccine. An adult without evidence of immunity to varicella should receive 2 doses or a second dose if he has previously received 1 dose.  Human papillomavirus (HPV) vaccine. Males aged 44 21 years who have not received the vaccine previously should receive the 3-dose series. Males aged 43 26 years may be immunized. Immunization is recommended through the age of 50 years for any male who has sex with males and did not get any or all doses earlier. Immunization is recommended for any person with an immunocompromised condition through the age of 23 years if he did not get any or all doses earlier. During the 3-dose series, the second dose should be obtained 4 8 weeks after the first dose. The third dose should be obtained 24 weeks after the first dose and 16 weeks after the second dose.  Zoster vaccine. One dose is recommended for adults aged 96 years or older unless certain conditions are present.  Measles, mumps, and rubella (MMR) vaccine. Adults born before 55 generally are considered immune to measles and mumps. Adults born in [birth year] or later should have 1 or more doses of MMR vaccine unless there is a contraindication to the vaccine or there is laboratory evidence of immunity to each of the three diseases. A routine second dose of MMR vaccine should be obtained at least 28 days after the first dose for students attending postsecondary schools, health care  workers, or international travelers. People who received inactivated measles vaccine or an unknown type of measles vaccine during 1963 1967 should receive 2 doses of MMR vaccine. People who received inactivated mumps vaccine or an unknown type of mumps vaccine before 1979 and are at high risk for mumps infection should consider immunization with 2 doses of MMR vaccine. Unvaccinated health care workers born before [birth year] who lack laboratory evidence of measles, mumps, or rubella immunity or laboratory confirmation of disease should consider measles and mumps immunization with 2 doses of MMR vaccine or rubella immunization with 1 dose of MMR vaccine.  Pneumococcal 13-valent conjugate (PCV13) vaccine. When indicated, a person who is uncertain of his immunization history and has no record of immunization  should receive the PCV13 vaccine. An adult aged 67 years or older who has certain medical conditions and has not been previously immunized should receive 1 dose of PCV13 vaccine. This PCV13 should be followed with a dose of pneumococcal polysaccharide (PPSV23) vaccine. The PPSV23 vaccine dose should be obtained at least 8 weeks after the dose of PCV13 vaccine. An adult aged 79 years or older who has certain medical conditions and previously received 1 or more doses of PPSV23 vaccine should receive 1 dose of PCV13. The PCV13 vaccine dose should be obtained 1 or more years after the last PPSV23 vaccine dose.  Pneumococcal polysaccharide (PPSV23) vaccine. When PCV13 is also indicated, PCV13 should be obtained first. All adults aged 74 years and older should be immunized. An adult younger than age 50 years who has certain medical conditions should be immunized. Any person who resides in a nursing home or long-term care facility should be immunized. An adult smoker should be immunized. People with an immunocompromised condition and certain other conditions should receive both PCV13 and PPSV23 vaccines. People with human  immunodeficiency virus (HIV) infection should be immunized as soon as possible after diagnosis. Immunization during chemotherapy or radiation therapy should be avoided. Routine use of PPSV23 vaccine is not recommended for American Indians, [place] Natives, or people younger than 65 years unless there are medical conditions that require PPSV23 vaccine. When indicated, people who have unknown immunization and have no record of immunization should receive PPSV23 vaccine. One-time revaccination 5 years after the first dose of PPSV23 is recommended for people aged 41 64 years who have chronic kidney failure, nephrotic syndrome, asplenia, or immunocompromised conditions. People who received 1 2 doses of PPSV23 before age 15 years should receive another dose of PPSV23 vaccine at age 48 years or later if at least 5 years have passed since the previous dose. Doses of PPSV23 are not needed for people immunized with PPSV23 at or after age 69 years.  Meningococcal vaccine. Adults with asplenia or persistent complement component deficiencies should receive 2 doses of quadrivalent meningococcal conjugate (MenACWY-D) vaccine. The doses should be obtained at least 2 months apart. Microbiologists working with certain meningococcal bacteria, [place] recruits, people at risk during an outbreak, and people who travel to or live in countries with a high rate of meningitis should be immunized. A first-year college student up through age 7 years who is living in a residence hall should receive a dose if he did not receive a dose on or after his 16th birthday. Adults who have certain high-risk conditions should receive one or more doses of vaccine.  Hepatitis A vaccine. Adults who wish to be protected from this disease, have certain high-risk conditions, work with hepatitis A-infected animals, work in hepatitis A research labs, or travel to or work in countries with a high rate of hepatitis A should be immunized. Adults who were  previously unvaccinated and who anticipate close contact with an international adoptee during the first 60 days after arrival in the [place] from a country with a high rate of hepatitis A should be immunized.  Hepatitis B vaccine. Adults who wish to be protected from this disease, have certain high-risk conditions, may be exposed to blood or other infectious body fluids, are household contacts or sex partners of hepatitis B positive people, are clients or workers in certain care facilities, or travel to or work in countries with a high rate of hepatitis B should be immunized.  Haemophilus influenzae type b (Hib) vaccine. A  previously unvaccinated person with asplenia or sickle cell disease or having a scheduled splenectomy should receive 1 dose of Hib vaccine. Regardless of previous immunization, a recipient of a hematopoietic stem cell transplant should receive a 3-dose series 6 12 months after his successful transplant. Hib vaccine is not recommended for adults with HIV infection. Preventive Service / Frequency Ages 62 to 3  Blood pressure check.** / Every 1 to 2 years.  Lipid and cholesterol check.** / Every 5 years beginning at age 43.  Hepatitis C blood test.** / For any individual with known risks for hepatitis C.  Skin self-exam. / Monthly.  Influenza vaccine. / Every year.  Tetanus, diphtheria, and acellular pertussis (Tdap, Td) vaccine.** / Consult your health care provider. 1 dose of Td every 10 years.  Varicella vaccine.** / Consult your health care provider.  HPV vaccine. / 3 doses over 6 months, if 48 or younger.  Measles, mumps, rubella (MMR) vaccine.** / You need at least 1 dose of MMR if you were born in 1957 or later. You may also need a second dose.  Pneumococcal 13-valent conjugate (PCV13) vaccine.** / Consult your health care provider.  Pneumococcal polysaccharide (PPSV23) vaccine.** / 1 to 2 doses if you smoke cigarettes or if you have certain  conditions.  Meningococcal vaccine.** / 1 dose if you are age 8 to 70 years and a Market researcher living in a residence hall, or have one of several medical conditions. You may also need additional booster doses.  Hepatitis A vaccine.** / Consult your health care provider.  Hepatitis B vaccine.** / Consult your health care provider.  Haemophilus influenzae type b (Hib) vaccine.** / Consult your health care provider. Ages 48 to 32  Blood pressure check.** / Every 1 to 2 years.  Lipid and cholesterol check.** / Every 5 years beginning at age 38.  Lung cancer screening. / Every year if you are aged 40 80 years and have a 30-pack-year history of smoking and currently smoke or have quit within the past 15 years. Yearly screening is stopped once you have quit smoking for at least 15 years or develop a health problem that would prevent you from having lung cancer treatment.  Fecal occult blood test (FOBT) of stool. / Every year beginning at age 4 and continuing until age 70. You may not have to do this test if you get a colonoscopy every 10 years.  Flexible sigmoidoscopy** or colonoscopy.** / Every 5 years for a flexible sigmoidoscopy or every 10 years for a colonoscopy beginning at age 76 and continuing until age 62.  Hepatitis C blood test.** / For all people born from [birth year] through 1965 and any individual with known risks for hepatitis C.  Skin self-exam. / Monthly.  Influenza vaccine. / Every year.  Tetanus, diphtheria, and acellular pertussis (Tdap/Td) vaccine.** / Consult your health care provider. 1 dose of Td every 10 years.  Varicella vaccine.** / Consult your health care provider.  Zoster vaccine.** / 1 dose for adults aged 60 years or older.  Measles, mumps, rubella (MMR) vaccine.** / You need at least 1 dose of MMR if you were born in 1957 or later. You may also need a second dose.  Pneumococcal 13-valent conjugate (PCV13) vaccine.** / Consult your health care  provider.  Pneumococcal polysaccharide (PPSV23) vaccine.** / 1 to 2 doses if you smoke cigarettes or if you have certain conditions.  Meningococcal vaccine.** / Consult your health care provider.  Hepatitis A vaccine.** / Consult your health care  provider.  Hepatitis B vaccine.** / Consult your health care provider.  Haemophilus influenzae type b (Hib) vaccine.** / Consult your health care provider. Ages 65 and over  Blood pressure check.** / Every 1 to 2 years.  Lipid and cholesterol check.**/ Every 5 years beginning at age 20.  Lung cancer screening. / Every year if you are aged 55 80 years and have a 30-pack-year history of smoking and currently smoke or have quit within the past 15 years. Yearly screening is stopped once you have quit smoking for at least 15 years or develop a health problem that would prevent you from having lung cancer treatment.  Fecal occult blood test (FOBT) of stool. / Every year beginning at age 50 and continuing until age 75. You may not have to do this test if you get a colonoscopy every 10 years.  Flexible sigmoidoscopy** or colonoscopy.** / Every 5 years for a flexible sigmoidoscopy or every 10 years for a colonoscopy beginning at age 50 and continuing until age 75.  Hepatitis C blood test.** / For all people born from 1945 through 1965 and any individual with known risks for hepatitis C.  Abdominal aortic aneurysm (AAA) screening.** / A one-time screening for ages 65 to 75 years who are current or former smokers.  Skin self-exam. / Monthly.  Influenza vaccine. / Every year.  Tetanus, diphtheria, and acellular pertussis (Tdap/Td) vaccine.** / 1 dose of Td every 10 years.  Varicella vaccine.** / Consult your health care provider.  Zoster vaccine.** / 1 dose for adults aged 60 years or older.  Pneumococcal 13-valent conjugate (PCV13) vaccine.** / Consult your health care provider.  Pneumococcal polysaccharide (PPSV23) vaccine.** / 1 dose for all  adults aged 65 years and older.  Meningococcal vaccine.** / Consult your health care provider.  Hepatitis A vaccine.** / Consult your health care provider.  Hepatitis B vaccine.** / Consult your health care provider.  Haemophilus influenzae type b (Hib) vaccine.** / Consult your health care provider. **Family history and personal history of risk and conditions may change your health care provider's recommendations. Document Released: 01/26/2002 Document Revised: 09/20/2013 Document Reviewed: 04/27/2011 [employer] Patient Information 2014 [employer].  

## 2014-03-23 ENCOUNTER — Telehealth: Payer: Self-pay | Admitting: Family Medicine

## 2014-03-23 NOTE — Telephone Encounter (Signed)
Relevant patient education assigned to patient using Emmi. ° °

## 2014-03-26 ENCOUNTER — Other Ambulatory Visit: Payer: Self-pay | Admitting: *Deleted

## 2014-03-30 ENCOUNTER — Ambulatory Visit (INDEPENDENT_AMBULATORY_CARE_PROVIDER_SITE_OTHER): Payer: Federal, State, Local not specified - PPO | Admitting: Cardiology

## 2014-03-30 ENCOUNTER — Encounter: Payer: Self-pay | Admitting: Cardiology

## 2014-03-30 VITALS — BP 131/84 | HR 62 | Ht 75.0 in | Wt 188.2 lb

## 2014-03-30 DIAGNOSIS — I714 Abdominal aortic aneurysm, without rupture, unspecified: Secondary | ICD-10-CM

## 2014-03-30 DIAGNOSIS — E785 Hyperlipidemia, unspecified: Secondary | ICD-10-CM

## 2014-03-30 DIAGNOSIS — I2510 Atherosclerotic heart disease of native coronary artery without angina pectoris: Secondary | ICD-10-CM

## 2014-03-30 DIAGNOSIS — I25810 Atherosclerosis of coronary artery bypass graft(s) without angina pectoris: Secondary | ICD-10-CM

## 2014-03-30 NOTE — Patient Instructions (Signed)
Your physician recommends that you return for a FASTING lipid profile in June 2015.   Your physician wants you to follow-up in: 6 months with Dr Aundra Dubin. (October 2015).  You will receive a reminder letter in the mail two months in advance. If you don't receive a letter, please call our office to schedule the follow-up appointment.

## 2014-04-01 NOTE — Progress Notes (Signed)
Patient ID: [name], male   DOB: [birth year], 63 y.o.   MRN: [id number] PCP: Dr. Etter Sjogren  63 yo with history of CAD s/p CABG and AAA presents for cardiology followup.  He had CABG in 2002. Last cath was in 5/12.  EF was 50% on LV-gram, and 3/5 grafts were patent.   Recently, he has been doing well.  No exertional chest pain.  No exertional dyspnea.  BP is controlled.  Weight is down 9 lbs.  AAA was stable on recent US in 3/15.    ECG: NSR, inferior and anterolateral T wave inversions  Labs (4/14): K 3.6, creatinine 1.2, LDL 82, HDL 35 Labs (3/15): LDL 93, HDL 41 Labs (4/15): K 3.8, creatinine 1.1  PMH: 1. CAD: s/p CABG 2002. LHC (5/12) with EF 50%; LIMA-LAD atretic, SVG-OM patent, SVG-ramus patent, seq SVG-RCA and PDA with RCA arm patent and PDA arm occluded.   2. HTN 3. AAA: 3.7 cm on [place] in 3/14.  3.7 cm on [place] in 3/15.  4. OSA: On CPAP 5. GERD 6. Low back pain s/p surgery 7. Hyperlipidemia  SH: Married, 5 kids, retired from the [employer], lives [place].   FH: CAD  Current Outpatient Prescriptions  Medication Sig Dispense Refill  . aspirin 81 MG EC tablet Take 81 mg by mouth at bedtime.       Marland Kitchen atorvastatin (LIPITOR) 20 MG tablet Take 1 tablet (20 mg total) by mouth daily.  90 tablet  3  . clonazePAM (KLONOPIN) 0.5 MG tablet Take 1-3 tablets (0.5-1.5 mg total) by mouth at bedtime. As directed (3 tabs qhs except for the nights where Remeron is taken, then 1 tab qhs.)  270 tablet  1  . Coenzyme Q10 (CO Q 10) 100 MG CAPS Take 100 mg by mouth daily.      . famotidine (PEPCID) 20 MG tablet Take 20 mg by mouth daily.      . fenofibrate 160 MG tablet Take 1 tablet (160 mg total) by mouth at bedtime.  90 tablet  3  . gabapentin (NEURONTIN) 100 MG capsule Take one in the morning one in the afternoon and three at night, for insomnia, pain and tics.      Marland Kitchen HYDROcodone-acetaminophen (NORCO) 10-325 MG per tablet Take 1 tablet by mouth every 4 (four) hours as needed.      Marland Kitchen  KRILL OIL PO Take 500 mg by mouth daily.      . metoprolol succinate (TOPROL-XL) 25 MG 24 hr tablet Take 1 tablet (25 mg total) by mouth every morning.  90 tablet  3  . mirtazapine (REMERON) 45 MG tablet Take 45 mg by mouth as directed.      . nitroGLYCERIN (NITROSTAT) 0.4 MG SL tablet Place 1 tablet (0.4 mg total) under the tongue every 5 (five) minutes as needed. for chest pain  25 tablet  3  . ramipril (ALTACE) 5 MG capsule Take 1 capsule (5 mg total) by mouth 2 (two) times daily.  180 capsule  3  . tiZANidine (ZANAFLEX) 4 MG tablet Take 6 mg by mouth 2 (two) times daily as needed. For muscle spasms      . traMADol (ULTRAM) 50 MG tablet Take 1 tablet (50 mg total) by mouth every 8 (eight) hours as needed.  30 tablet  3  . triamcinolone cream (KENALOG) 0.1 % Apply 1 application topically as needed.       . Turmeric 500 MG CAPS Take 500 mg by  mouth daily.       . valACYclovir (VALTREX) 500 MG tablet Take 500 mg by mouth 2 (two) times daily as needed. For flare up  Of cold sore       No current facility-administered medications for this visit.    BP 131/84  Pulse 62  Ht 6\' 3"  (1.905 m)  Wt 85.367 kg (188 lb 3.2 oz)  BMI 23.52 kg/m2 General: NAD Neck: No JVD, no thyromegaly or thyroid nodule.  Lungs: Clear to auscultation bilaterally with normal respiratory effort. CV: Nondisplaced PMI.  Heart regular S1/S2, no S3/S4, no murmur.  No peripheral edema.  No carotid bruit.  Normal pedal pulses.  Abdomen: Soft, nontender, no hepatosplenomegaly, no distention.  Skin: Intact without lesions or rashes.  Neurologic: Alert and oriented x 3.  Psych: Normal affect. Extremities: No clubbing or cyanosis.  HEENT: Facial tic  Assessment/Plan:  1. CAD: s/p CABG.  Last cath in 5/12 showed 3/5 patent grafts. No ischemic symptoms.  Continue ASA 81, statin, ramipril, and Toprol XL.  2. AAA: Stable abdominal US.  Repeat in 3/16.   3. Hyperlipidemia: Lipids mildly elevated in 3/15.  Goal LDL < 70.  He has  started on atorvastatin and will have repeat lipids in 6/15.   Followup in 6 months.   Larey Dresser 04/01/2014

## 2014-04-08 ENCOUNTER — Other Ambulatory Visit: Payer: Self-pay | Admitting: Neurology

## 2014-04-09 ENCOUNTER — Other Ambulatory Visit: Payer: Self-pay

## 2014-04-09 DIAGNOSIS — G4700 Insomnia, unspecified: Secondary | ICD-10-CM

## 2014-04-09 DIAGNOSIS — F959 Tic disorder, unspecified: Secondary | ICD-10-CM

## 2014-04-09 MED ORDER — CLONAZEPAM 0.5 MG PO TABS: 0.5000 mg | ORAL_TABLET | Freq: Every day | ORAL | Status: DC

## 2014-04-09 NOTE — Telephone Encounter (Signed)
Rx signed and faxed.

## 2014-04-20 ENCOUNTER — Ambulatory Visit: Payer: Federal, State, Local not specified - PPO | Admitting: Cardiology

## 2014-05-09 ENCOUNTER — Ambulatory Visit (INDEPENDENT_AMBULATORY_CARE_PROVIDER_SITE_OTHER): Payer: Federal, State, Local not specified - PPO | Admitting: Neurology

## 2014-05-09 ENCOUNTER — Encounter: Payer: Self-pay | Admitting: Neurology

## 2014-05-09 VITALS — BP 130/84 | HR 57 | Resp 16 | Ht 75.5 in | Wt 187.0 lb

## 2014-05-09 DIAGNOSIS — Z9989 Dependence on other enabling machines and devices: Principal | ICD-10-CM

## 2014-05-09 DIAGNOSIS — G4700 Insomnia, unspecified: Secondary | ICD-10-CM

## 2014-05-09 DIAGNOSIS — G4733 Obstructive sleep apnea (adult) (pediatric): Secondary | ICD-10-CM | POA: Insufficient documentation

## 2014-05-09 DIAGNOSIS — Z9119 Patient's noncompliance with other medical treatment and regimen: Secondary | ICD-10-CM

## 2014-05-09 DIAGNOSIS — Z91199 Patient's noncompliance with other medical treatment and regimen due to unspecified reason: Secondary | ICD-10-CM

## 2014-05-09 DIAGNOSIS — F959 Tic disorder, unspecified: Secondary | ICD-10-CM

## 2014-05-09 DIAGNOSIS — Z9114 Patient's other noncompliance with medication regimen: Secondary | ICD-10-CM

## 2014-05-09 MED ORDER — MIRTAZAPINE 45 MG PO TABS: 45.0000 mg | ORAL_TABLET | Freq: Every day | ORAL | Status: DC

## 2014-05-09 MED ORDER — CLONAZEPAM 0.5 MG PO TABS: 0.5000 mg | ORAL_TABLET | Freq: Every day | ORAL | Status: DC

## 2014-05-09 MED ORDER — TIZANIDINE HCL 4 MG PO TABS: 6.0000 mg | ORAL_TABLET | Freq: Two times a day (BID) | ORAL | Status: DC | PRN

## 2014-05-09 NOTE — Patient Instructions (Signed)
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
## 2014-05-14 ENCOUNTER — Other Ambulatory Visit (INDEPENDENT_AMBULATORY_CARE_PROVIDER_SITE_OTHER): Payer: Federal, State, Local not specified - PPO

## 2014-05-14 ENCOUNTER — Other Ambulatory Visit: Payer: Federal, State, Local not specified - PPO

## 2014-05-14 DIAGNOSIS — E785 Hyperlipidemia, unspecified: Secondary | ICD-10-CM

## 2014-05-14 DIAGNOSIS — I25810 Atherosclerosis of coronary artery bypass graft(s) without angina pectoris: Secondary | ICD-10-CM

## 2014-05-14 DIAGNOSIS — I2510 Atherosclerotic heart disease of native coronary artery without angina pectoris: Secondary | ICD-10-CM

## 2014-05-14 LAB — LIPID PANEL
Cholesterol: 138 mg/dL (ref 0–200)
HDL: 42.3 mg/dL (ref >39.00)
LDL Cholesterol: 83 mg/dL (ref 0–99)
Total CHOL/HDL Ratio: 3
Triglycerides: 64 mg/dL (ref 0.0–149.0)
VLDL: 12.8 mg/dL (ref 0.0–40.0)

## 2014-05-14 LAB — HEPATIC FUNCTION PANEL
ALT: 20 U/L (ref 0–53)
AST: 24 U/L (ref 0–37)
Albumin: 4.7 g/dL (ref 3.5–5.2)
Alkaline Phosphatase: 30 U/L — ABNORMAL LOW (ref 39–117)
Bilirubin, Direct: 0.1 mg/dL (ref 0.0–0.3)
Total Bilirubin: 0.6 mg/dL (ref 0.2–1.2)
Total Protein: 7.3 g/dL (ref 6.0–8.3)

## 2014-06-16 ENCOUNTER — Other Ambulatory Visit: Payer: Self-pay | Admitting: Neurology

## 2014-07-11 ENCOUNTER — Encounter: Payer: Self-pay | Admitting: Neurology

## 2014-07-12 ENCOUNTER — Encounter: Payer: Self-pay | Admitting: Neurology

## 2014-07-12 ENCOUNTER — Telehealth: Payer: Self-pay | Admitting: *Deleted

## 2014-07-12 ENCOUNTER — Other Ambulatory Visit: Payer: Self-pay | Admitting: Neurology

## 2014-07-12 DIAGNOSIS — G4700 Insomnia, unspecified: Secondary | ICD-10-CM

## 2014-07-12 DIAGNOSIS — F959 Tic disorder, unspecified: Secondary | ICD-10-CM

## 2014-07-12 MED ORDER — CLONAZEPAM 0.5 MG PO TABS: 0.5000 mg | ORAL_TABLET | Freq: Every day | ORAL | Status: DC

## 2014-07-12 NOTE — Telephone Encounter (Signed)
Patient calling to state that when he called [facility] yesterday to get his Clonazepam script but they did not have anything on file. Please return call to patient and advise.

## 2014-07-12 NOTE — Telephone Encounter (Signed)
Rx has been faxed.

## 2014-07-12 NOTE — Telephone Encounter (Signed)
Request forwarded to provider for approval  

## 2014-07-12 NOTE — Telephone Encounter (Signed)
Patient called earlier today regarding Rx.  Request was already forwarded to provider.

## 2014-07-13 ENCOUNTER — Telehealth: Payer: Self-pay | Admitting: Neurology

## 2014-07-13 DIAGNOSIS — G4700 Insomnia, unspecified: Secondary | ICD-10-CM

## 2014-07-13 DIAGNOSIS — F959 Tic disorder, unspecified: Secondary | ICD-10-CM

## 2014-07-13 MED ORDER — CLONAZEPAM 0.5 MG PO TABS: ORAL_TABLET | ORAL | Status: DC

## 2014-07-13 NOTE — Telephone Encounter (Signed)
Patient calling regarding Klonopin script, states that [place] said his script was worded improperly, states that it needs to be written to say 1-4 tablets per day since Dr. Brett Fairy increased his dosage. Patient states [facility] cancelled 2 different scripts and to not send it there, [facility] [place] [facility] [address]. Please return call to patient and advise.

## 2014-07-13 NOTE — Telephone Encounter (Signed)
Dr Dohmeier updated Rx instructions, and the [facility].  I called and spoke with the patient, they are aware.

## 2014-07-13 NOTE — Telephone Encounter (Signed)
Patient is requesting a new Rx for Clonazepam with directions stating they may take up to 4 tabs daily as needed for #360.  Directions currently say: Take 1-3 tablets (0.5-1.5 mg total) by mouth at bedtime. As directed (3 tabs qhs except for the nights where Remeron is taken, then 1 tab qhs.)  Please advise.  Thank you.

## 2014-07-13 NOTE — Telephone Encounter (Signed)
Patient is requesting a new Rx with directions for an increased dose on Klonopin.  Current Rx says: Take 1-3 tablets (0.5-1.5 mg total) by mouth at bedtime. As directed (3 tabs qhs except for the nights where Remeron is taken, then 1 tab qhs.)  They are requesting the instructions be updated to read 1-4 tabs daily, for a quantity of #360.  Please advise.  Thank you.

## 2014-07-16 MED ORDER — CLONAZEPAM 1 MG PO TABS: 1.0000 mg | ORAL_TABLET | Freq: Three times a day (TID) | ORAL | Status: DC | PRN

## 2014-07-16 NOTE — Telephone Encounter (Signed)
I spoke with [name], who said the Rx for 0.5mg  tabs up to 4 daily #360 is what the wanted prescribed.  They will call us back if anything further is needed.

## 2014-07-16 NOTE — Telephone Encounter (Signed)
This is for 1 mg tab, of which [name] can take 1/2 tab to get to his current prescription of 0.5 mg up to 4 a day.

## 2014-07-27 ENCOUNTER — Encounter: Payer: Self-pay | Admitting: Internal Medicine

## 2014-08-06 ENCOUNTER — Encounter: Payer: Self-pay | Admitting: Neurology

## 2014-09-26 ENCOUNTER — Ambulatory Visit: Payer: Federal, State, Local not specified - PPO | Admitting: Family Medicine

## 2014-10-30 ENCOUNTER — Ambulatory Visit: Payer: Federal, State, Local not specified - PPO | Admitting: Family Medicine

## 2014-11-26 ENCOUNTER — Encounter: Payer: Self-pay | Admitting: Neurology

## 2014-11-27 ENCOUNTER — Other Ambulatory Visit: Payer: Self-pay

## 2014-11-27 MED ORDER — CLONAZEPAM 0.5 MG PO TABS: 0.5000 mg | ORAL_TABLET | Freq: Every evening | ORAL | Status: DC

## 2014-11-27 NOTE — Telephone Encounter (Signed)
Rx signed and faxed.

## 2014-12-03 ENCOUNTER — Other Ambulatory Visit: Payer: Self-pay | Admitting: Neurology

## 2015-01-05 ENCOUNTER — Encounter: Payer: Self-pay | Admitting: Family Medicine

## 2015-01-05 ENCOUNTER — Encounter: Payer: Self-pay | Admitting: Neurology

## 2015-01-05 DIAGNOSIS — E785 Hyperlipidemia, unspecified: Secondary | ICD-10-CM

## 2015-01-07 ENCOUNTER — Other Ambulatory Visit: Payer: Self-pay

## 2015-01-07 MED ORDER — ATORVASTATIN CALCIUM 20 MG PO TABS: 20.0000 mg | ORAL_TABLET | Freq: Every day | ORAL | Status: DC

## 2015-01-07 MED ORDER — MIRTAZAPINE 45 MG PO TABS: 45.0000 mg | ORAL_TABLET | Freq: Every day | ORAL | Status: DC

## 2015-01-07 MED ORDER — VALACYCLOVIR HCL 500 MG PO TABS: 500.0000 mg | ORAL_TABLET | Freq: Two times a day (BID) | ORAL | Status: DC | PRN

## 2015-01-07 MED ORDER — CLONAZEPAM 0.5 MG PO TABS: 0.5000 mg | ORAL_TABLET | Freq: Every evening | ORAL | Status: DC

## 2015-01-17 ENCOUNTER — Encounter: Payer: Self-pay | Admitting: Cardiology

## 2015-02-19 ENCOUNTER — Other Ambulatory Visit: Payer: Self-pay | Admitting: Radiology

## 2015-02-19 DIAGNOSIS — I714 Abdominal aortic aneurysm, without rupture, unspecified: Secondary | ICD-10-CM

## 2015-02-26 ENCOUNTER — Other Ambulatory Visit: Payer: Self-pay | Admitting: Family Medicine

## 2015-02-26 NOTE — Telephone Encounter (Signed)
90 day supply of ramipril sent to mail order. Pt was due for follow up in October and will be due for CPE on or after 03/22/14. Please call pt to arrange CPE with Dr Etter Sjogren after 03/23/15. Further refills cannot be given until pt is seen in the office.

## 2015-02-26 NOTE — Telephone Encounter (Signed)
Informed patient of med refill and he scheduled cpe for 04/18/15

## 2015-03-01 ENCOUNTER — Ambulatory Visit (HOSPITAL_COMMUNITY): Payer: Federal, State, Local not specified - PPO | Attending: Cardiology | Admitting: Cardiology

## 2015-03-01 DIAGNOSIS — I714 Abdominal aortic aneurysm, without rupture, unspecified: Secondary | ICD-10-CM

## 2015-03-01 NOTE — Progress Notes (Signed)
Aorta duplex performed

## 2015-03-05 ENCOUNTER — Telehealth: Payer: Self-pay | Admitting: Cardiology

## 2015-03-05 NOTE — Telephone Encounter (Signed)
F/U ° ° ° ° ° ° ° ° ° °Pt returning call. Please call back.  °

## 2015-03-05 NOTE — Telephone Encounter (Signed)
New Msg       Pt is returning call from yesterday.     Please return call.

## 2015-03-05 NOTE — Telephone Encounter (Signed)
LMTCB

## 2015-03-05 NOTE — Telephone Encounter (Signed)
Spoke with patient about recent abdominal ultrasound results.

## 2015-03-06 ENCOUNTER — Other Ambulatory Visit: Payer: Self-pay | Admitting: Family Medicine

## 2015-03-06 NOTE — Telephone Encounter (Signed)
Received request from [facility] for refill of Atorvastatin. Rx sent to mail order on 01/02/15 and should still have refill remaining. Spoke with pt and he states he isn't currently requesting Rx from local pharmacy. Just received mail order supply. Denial sent to pharmacy.

## 2015-03-28 ENCOUNTER — Encounter: Payer: Self-pay | Admitting: Family Medicine

## 2015-03-28 DIAGNOSIS — I10 Essential (primary) hypertension: Secondary | ICD-10-CM

## 2015-03-28 MED ORDER — METOPROLOL SUCCINATE ER 25 MG PO TB24: 25.0000 mg | ORAL_TABLET | Freq: Every morning | ORAL | Status: DC

## 2015-03-29 ENCOUNTER — Telehealth: Payer: Self-pay | Admitting: Family Medicine

## 2015-03-29 NOTE — Telephone Encounter (Signed)
Pre Visit letter sent  °

## 2015-04-12 ENCOUNTER — Other Ambulatory Visit: Payer: Self-pay | Admitting: Neurology

## 2015-04-12 ENCOUNTER — Encounter: Payer: Self-pay | Admitting: Neurology

## 2015-04-17 ENCOUNTER — Telehealth: Payer: Self-pay | Admitting: *Deleted

## 2015-04-17 ENCOUNTER — Encounter: Payer: Self-pay | Admitting: *Deleted

## 2015-04-17 NOTE — Telephone Encounter (Signed)
Pre-Visit Call completed with patient and chart updated.   Pre-Visit Info documented in Specialty Comments under [employer].    

## 2015-04-18 ENCOUNTER — Encounter: Payer: Self-pay | Admitting: Family Medicine

## 2015-04-18 ENCOUNTER — Ambulatory Visit (INDEPENDENT_AMBULATORY_CARE_PROVIDER_SITE_OTHER): Payer: Federal, State, Local not specified - PPO | Admitting: Family Medicine

## 2015-04-18 VITALS — BP 116/60 | HR 55 | Temp 97.8°F | Ht 75.0 in | Wt 196.8 lb

## 2015-04-18 DIAGNOSIS — I10 Essential (primary) hypertension: Secondary | ICD-10-CM | POA: Diagnosis not present

## 2015-04-18 DIAGNOSIS — E785 Hyperlipidemia, unspecified: Secondary | ICD-10-CM | POA: Diagnosis not present

## 2015-04-18 DIAGNOSIS — Z0000 Encounter for general adult medical examination without abnormal findings: Secondary | ICD-10-CM

## 2015-04-18 DIAGNOSIS — N400 Enlarged prostate without lower urinary tract symptoms: Secondary | ICD-10-CM | POA: Diagnosis not present

## 2015-04-18 DIAGNOSIS — G4733 Obstructive sleep apnea (adult) (pediatric): Secondary | ICD-10-CM | POA: Diagnosis not present

## 2015-04-18 LAB — LIPID PANEL
Cholesterol: 150 mg/dL (ref 0–200)
HDL: 43.7 mg/dL (ref >39.00)
LDL Cholesterol: 90 mg/dL (ref 0–99)
NonHDL: 106.3
Total CHOL/HDL Ratio: 3
Triglycerides: 81 mg/dL (ref 0.0–149.0)
VLDL: 16.2 mg/dL (ref 0.0–40.0)

## 2015-04-18 LAB — POCT URINALYSIS DIPSTICK
Bilirubin, UA: NEGATIVE
Blood, UA: NEGATIVE
Glucose, UA: NEGATIVE
Ketones, UA: NEGATIVE
Leukocytes, UA: NEGATIVE
Nitrite, UA: NEGATIVE
Protein, UA: NEGATIVE
Spec Grav, UA: 1.025
Urobilinogen, UA: 4
pH, UA: 6

## 2015-04-18 LAB — BASIC METABOLIC PANEL
BUN: 14 mg/dL (ref 6–23)
CO2: 30 mEq/L (ref 19–32)
Calcium: 9.5 mg/dL (ref 8.4–10.5)
Chloride: 106 mEq/L (ref 96–112)
Creatinine, Ser: 1.19 mg/dL (ref 0.40–1.50)
GFR: 65.58 mL/min (ref >60.00)
Glucose, Bld: 91 mg/dL (ref 70–99)
Potassium: 3.9 mEq/L (ref 3.5–5.1)
Sodium: 139 mEq/L (ref 135–145)

## 2015-04-18 LAB — CBC WITH DIFFERENTIAL/PLATELET
Basophils Absolute: 0 10*3/uL (ref 0.0–0.1)
Basophils Relative: 0.6 % (ref 0.0–3.0)
Eosinophils Absolute: 0.1 10*3/uL (ref 0.0–0.7)
Eosinophils Relative: 2.5 % (ref 0.0–5.0)
HCT: 37 % — ABNORMAL LOW (ref 39.0–52.0)
Hemoglobin: 12.5 g/dL — ABNORMAL LOW (ref 13.0–17.0)
Lymphocytes Relative: 29.8 % (ref 12.0–46.0)
Lymphs Abs: 1.1 10*3/uL (ref 0.7–4.0)
MCHC: 33.8 g/dL (ref 30.0–36.0)
MCV: 93.3 fl (ref 78.0–100.0)
Monocytes Absolute: 0.2 10*3/uL (ref 0.1–1.0)
Monocytes Relative: 6.1 % (ref 3.0–12.0)
Neutro Abs: 2.3 10*3/uL (ref 1.4–7.7)
Neutrophils Relative %: 61 % (ref 43.0–77.0)
Platelets: 198 10*3/uL (ref 150.0–400.0)
RBC: 3.97 Mil/uL — ABNORMAL LOW (ref 4.22–5.81)
RDW: 13.3 % (ref 11.5–15.5)
WBC: 3.8 10*3/uL — ABNORMAL LOW (ref 4.0–10.5)

## 2015-04-18 LAB — HEPATIC FUNCTION PANEL
ALT: 16 U/L (ref 0–53)
AST: 15 U/L (ref 0–37)
Albumin: 4.4 g/dL (ref 3.5–5.2)
Alkaline Phosphatase: 37 U/L — ABNORMAL LOW (ref 39–117)
Bilirubin, Direct: 0.1 mg/dL (ref 0.0–0.3)
Total Bilirubin: 0.5 mg/dL (ref 0.2–1.2)
Total Protein: 7.3 g/dL (ref 6.0–8.3)

## 2015-04-18 LAB — TSH: TSH: 1.07 u[IU]/mL (ref 0.35–4.50)

## 2015-04-18 NOTE — Patient Instructions (Signed)
Preventive Care for Adults A healthy lifestyle and preventive care can promote health and wellness. Preventive health guidelines for men include the following key practices:  A routine yearly physical is a good way to check with your health care provider about your health and preventative screening. It is a chance to share any concerns and updates on your health and to receive a thorough exam.  Visit your dentist for a routine exam and preventative care every 6 months. Brush your teeth twice a day and floss once a day. Good oral hygiene prevents tooth decay and gum disease.  The frequency of eye exams is based on your age, health, family medical history, use of contact lenses, and other factors. Follow your health care provider's recommendations for frequency of eye exams.  Eat a healthy diet. Foods such as vegetables, fruits, whole grains, low-fat dairy products, and lean protein foods contain the nutrients you need without too many calories. Decrease your intake of foods high in solid fats, added sugars, and salt. Eat the right amount of calories for you.Get information about a proper diet from your health care provider, if necessary.  Regular physical exercise is one of the most important things you can do for your health. Most adults should get at least 150 minutes of moderate-intensity exercise (any activity that increases your heart rate and causes you to sweat) each week. In addition, most adults need muscle-strengthening exercises on 2 or more days a week.  Maintain a healthy weight. The body mass index (BMI) is a screening tool to identify possible weight problems. It provides an estimate of body fat based on height and weight. Your health care provider can find your BMI and can help you achieve or maintain a healthy weight.For adults 20 years and older:  A BMI below 18.5 is considered underweight.  A BMI of 18.5 to 24.9 is normal.  A BMI of 25 to 29.9 is considered overweight.  A BMI  of 30 and above is considered obese.  Maintain normal blood lipids and cholesterol levels by exercising and minimizing your intake of saturated fat. Eat a balanced diet with plenty of fruit and vegetables. Blood tests for lipids and cholesterol should begin at age 50 and be repeated every 5 years. If your lipid or cholesterol levels are high, you are over 50, or you are at high risk for heart disease, you may need your cholesterol levels checked more frequently.Ongoing high lipid and cholesterol levels should be treated with medicines if diet and exercise are not working.  If you smoke, find out from your health care provider how to quit. If you do not use tobacco, do not start.  Lung cancer screening is recommended for adults aged 73-80 years who are at high risk for developing lung cancer because of a history of smoking. A yearly low-dose CT scan of the lungs is recommended for people who have at least a 30-pack-year history of smoking and are a current smoker or have quit within the past 15 years. A pack year of smoking is smoking an average of 1 pack of cigarettes a day for 1 year (for example: 1 pack a day for 30 years or 2 packs a day for 15 years). Yearly screening should continue until the smoker has stopped smoking for at least 15 years. Yearly screening should be stopped for people who develop a health problem that would prevent them from having lung cancer treatment.  If you choose to drink alcohol, do not have more than  2 drinks per day. One drink is considered to be 12 ounces (355 mL) of beer, 5 ounces (148 mL) of wine, or 1.5 ounces (44 mL) of liquor.  Avoid use of street drugs. Do not share needles with anyone. Ask for help if you need support or instructions about stopping the use of drugs.  High blood pressure causes heart disease and increases the risk of stroke. Your blood pressure should be checked at least every 1-2 years. Ongoing high blood pressure should be treated with  medicines, if weight loss and exercise are not effective.  If you are 39-79 years old, ask your health care provider if you should take aspirin to prevent heart disease.  Diabetes screening involves taking a blood sample to check your fasting blood sugar level. This should be done once every 3 years, after age 59, if you are within normal weight and without risk factors for diabetes. Testing should be considered at a younger age or be carried out more frequently if you are overweight and have at least 1 risk factor for diabetes.  Colorectal cancer can be detected and often prevented. Most routine colorectal cancer screening begins at the age of 68 and continues through age 60. However, your health care provider may recommend screening at an earlier age if you have risk factors for colon cancer. On a yearly basis, your health care provider may provide home test kits to check for hidden blood in the stool. Use of a small camera at the end of a tube to directly examine the colon (sigmoidoscopy or colonoscopy) can detect the earliest forms of colorectal cancer. Talk to your health care provider about this at age 7, when routine screening begins. Direct exam of the colon should be repeated every 5-10 years through age 52, unless early forms of precancerous polyps or small growths are found.  People who are at an increased risk for hepatitis B should be screened for this virus. You are considered at high risk for hepatitis B if:  You were born in a country where hepatitis B occurs often. Talk with your health care provider about which countries are considered high risk.  Your parents were born in a high-risk country and you have not received a shot to protect against hepatitis B (hepatitis B vaccine).  You have HIV or AIDS.  You use needles to inject street drugs.  You live with, or have sex with, someone who has hepatitis B.  You are a man who has sex with other men (MSM).  You get hemodialysis  treatment.  You take certain medicines for conditions such as cancer, organ transplantation, and autoimmune conditions.  Hepatitis C blood testing is recommended for all people born from [birth year] through 1965 and any individual with known risks for hepatitis C.  Practice safe sex. Use condoms and avoid high-risk sexual practices to reduce the spread of sexually transmitted infections (STIs). STIs include gonorrhea, chlamydia, syphilis, trichomonas, herpes, HPV, and human immunodeficiency virus (HIV). Herpes, HIV, and HPV are viral illnesses that have no cure. They can result in disability, cancer, and death.  If you are at risk of being infected with HIV, it is recommended that you take a prescription medicine daily to prevent HIV infection. This is called preexposure prophylaxis (PrEP). You are considered at risk if:  You are a man who has sex with other men (MSM) and have other risk factors.  You are a heterosexual man, are sexually active, and are at increased risk for HIV infection.  You take drugs by injection.  You are sexually active with a partner who has HIV.  Talk with your health care provider about whether you are at high risk of being infected with HIV. If you choose to begin PrEP, you should first be tested for HIV. You should then be tested every 3 months for as long as you are taking PrEP.  A one-time screening for abdominal aortic aneurysm (AAA) and surgical repair of large AAAs by ultrasound are recommended for men ages 32 to 67 years who are current or former smokers.  Healthy men should no longer receive prostate-specific antigen (PSA) blood tests as part of routine cancer screening. Talk with your health care provider about prostate cancer screening.  Testicular cancer screening is not recommended for adult males who have no symptoms. Screening includes self-exam, a health care provider exam, and other screening tests. Consult with your health care provider about any symptoms  you have or any concerns you have about testicular cancer.  Use sunscreen. Apply sunscreen liberally and repeatedly throughout the day. You should seek shade when your shadow is shorter than you. Protect yourself by wearing long sleeves, pants, a wide-brimmed hat, and sunglasses year round, whenever you are outdoors.  Once a month, do a whole-body skin exam, using a mirror to look at the skin on your back. Tell your health care provider about new moles, moles that have irregular borders, moles that are larger than a pencil eraser, or moles that have changed in shape or color.  Stay current with required vaccines (immunizations).  Influenza vaccine. All adults should be immunized every year.  Tetanus, diphtheria, and acellular pertussis (Td, Tdap) vaccine. An adult who has not previously received Tdap or who does not know his vaccine status should receive 1 dose of Tdap. This initial dose should be followed by tetanus and diphtheria toxoids (Td) booster doses every 10 years. Adults with an unknown or incomplete history of completing a 3-dose immunization series with Td-containing vaccines should begin or complete a primary immunization series including a Tdap dose. Adults should receive a Td booster every 10 years.  Varicella vaccine. An adult without evidence of immunity to varicella should receive 2 doses or a second dose if he has previously received 1 dose.  Human papillomavirus (HPV) vaccine. Males aged 68-21 years who have not received the vaccine previously should receive the 3-dose series. Males aged 22-26 years may be immunized. Immunization is recommended through the age of 6 years for any male who has sex with males and did not get any or all doses earlier. Immunization is recommended for any person with an immunocompromised condition through the age of 49 years if he did not get any or all doses earlier. During the 3-dose series, the second dose should be obtained 4-8 weeks after the first  dose. The third dose should be obtained 24 weeks after the first dose and 16 weeks after the second dose.  Zoster vaccine. One dose is recommended for adults aged 50 years or older unless certain conditions are present.  Measles, mumps, and rubella (MMR) vaccine. Adults born before [birth year] generally are considered immune to measles and mumps. Adults born in [birth year] or later should have 1 or more doses of MMR vaccine unless there is a contraindication to the vaccine or there is laboratory evidence of immunity to each of the three diseases. A routine second dose of MMR vaccine should be obtained at least 28 days after the first dose for students attending postsecondary  schools, health care workers, or international travelers. People who received inactivated measles vaccine or an unknown type of measles vaccine during 1963-1967 should receive 2 doses of MMR vaccine. People who received inactivated mumps vaccine or an unknown type of mumps vaccine before 1979 and are at high risk for mumps infection should consider immunization with 2 doses of MMR vaccine. Unvaccinated health care workers born before 1957 who lack laboratory evidence of measles, mumps, or rubella immunity or laboratory confirmation of disease should consider measles and mumps immunization with 2 doses of MMR vaccine or rubella immunization with 1 dose of MMR vaccine.  Pneumococcal 13-valent conjugate (PCV13) vaccine. When indicated, a person who is uncertain of his immunization history and has no record of immunization should receive the PCV13 vaccine. An adult aged 19 years or older who has certain medical conditions and has not been previously immunized should receive 1 dose of PCV13 vaccine. This PCV13 should be followed with a dose of pneumococcal polysaccharide (PPSV23) vaccine. The PPSV23 vaccine dose should be obtained at least 8 weeks after the dose of PCV13 vaccine. An adult aged 19 years or older who has certain medical conditions and  previously received 1 or more doses of PPSV23 vaccine should receive 1 dose of PCV13. The PCV13 vaccine dose should be obtained 1 or more years after the last PPSV23 vaccine dose.  Pneumococcal polysaccharide (PPSV23) vaccine. When PCV13 is also indicated, PCV13 should be obtained first. All adults aged 65 years and older should be immunized. An adult younger than age 65 years who has certain medical conditions should be immunized. Any person who resides in a nursing home or long-term care facility should be immunized. An adult smoker should be immunized. People with an immunocompromised condition and certain other conditions should receive both PCV13 and PPSV23 vaccines. People with human immunodeficiency virus (HIV) infection should be immunized as soon as possible after diagnosis. Immunization during chemotherapy or radiation therapy should be avoided. Routine use of PPSV23 vaccine is not recommended for American Indians, [place] Natives, or people younger than 65 years unless there are medical conditions that require PPSV23 vaccine. When indicated, people who have unknown immunization and have no record of immunization should receive PPSV23 vaccine. One-time revaccination 5 years after the first dose of PPSV23 is recommended for people aged 19-64 years who have chronic kidney failure, nephrotic syndrome, asplenia, or immunocompromised conditions. People who received 1-2 doses of PPSV23 before age 65 years should receive another dose of PPSV23 vaccine at age 65 years or later if at least 5 years have passed since the previous dose. Doses of PPSV23 are not needed for people immunized with PPSV23 at or after age 65 years.  Meningococcal vaccine. Adults with asplenia or persistent complement component deficiencies should receive 2 doses of quadrivalent meningococcal conjugate (MenACWY-D) vaccine. The doses should be obtained at least 2 months apart. Microbiologists working with certain meningococcal bacteria,  military recruits, people at risk during an outbreak, and people who travel to or live in countries with a high rate of meningitis should be immunized. A first-year college student up through age 21 years who is living in a residence hall should receive a dose if he did not receive a dose on or after his 16th birthday. Adults who have certain high-risk conditions should receive one or more doses of vaccine.  Hepatitis A vaccine. Adults who wish to be protected from this disease, have certain high-risk conditions, work with hepatitis A-infected animals, work in hepatitis A research labs, or   travel to or work in countries with a high rate of hepatitis A should be immunized. Adults who were previously unvaccinated and who anticipate close contact with an international adoptee during the first 60 days after arrival in the [place] from a country with a high rate of hepatitis A should be immunized.  Hepatitis B vaccine. Adults should be immunized if they wish to be protected from this disease, have certain high-risk conditions, may be exposed to blood or other infectious body fluids, are household contacts or sex partners of hepatitis B positive people, are clients or workers in certain care facilities, or travel to or work in countries with a high rate of hepatitis B.  Haemophilus influenzae type b (Hib) vaccine. A previously unvaccinated person with asplenia or sickle cell disease or having a scheduled splenectomy should receive 1 dose of Hib vaccine. Regardless of previous immunization, a recipient of a hematopoietic stem cell transplant should receive a 3-dose series 6-12 months after his successful transplant. Hib vaccine is not recommended for adults with HIV infection. Preventive Service / Frequency Ages 52 to 17  Blood pressure check.** / Every 1 to 2 years.  Lipid and cholesterol check.** / Every 5 years beginning at age 69.  Hepatitis C blood test.** / For any individual with known risks for  hepatitis C.  Skin self-exam. / Monthly.  Influenza vaccine. / Every year.  Tetanus, diphtheria, and acellular pertussis (Tdap, Td) vaccine.** / Consult your health care provider. 1 dose of Td every 10 years.  Varicella vaccine.** / Consult your health care provider.  HPV vaccine. / 3 doses over 6 months, if 72 or younger.  Measles, mumps, rubella (MMR) vaccine.** / You need at least 1 dose of MMR if you were born in 1957 or later. You may also need a second dose.  Pneumococcal 13-valent conjugate (PCV13) vaccine.** / Consult your health care provider.  Pneumococcal polysaccharide (PPSV23) vaccine.** / 1 to 2 doses if you smoke cigarettes or if you have certain conditions.  Meningococcal vaccine.** / 1 dose if you are age 35 to 60 years and a Market researcher living in a residence hall, or have one of several medical conditions. You may also need additional booster doses.  Hepatitis A vaccine.** / Consult your health care provider.  Hepatitis B vaccine.** / Consult your health care provider.  Haemophilus influenzae type b (Hib) vaccine.** / Consult your health care provider. Ages 35 to 8  Blood pressure check.** / Every 1 to 2 years.  Lipid and cholesterol check.** / Every 5 years beginning at age 57.  Lung cancer screening. / Every year if you are aged 44-80 years and have a 30-pack-year history of smoking and currently smoke or have quit within the past 15 years. Yearly screening is stopped once you have quit smoking for at least 15 years or develop a health problem that would prevent you from having lung cancer treatment.  Fecal occult blood test (FOBT) of stool. / Every year beginning at age 55 and continuing until age 73. You may not have to do this test if you get a colonoscopy every 10 years.  Flexible sigmoidoscopy** or colonoscopy.** / Every 5 years for a flexible sigmoidoscopy or every 10 years for a colonoscopy beginning at age 28 and continuing until age  1.  Hepatitis C blood test.** / For all people born from [birth year] through 1965 and any individual with known risks for hepatitis C.  Skin self-exam. / Monthly.  Influenza vaccine. / Every  year.  Tetanus, diphtheria, and acellular pertussis (Tdap/Td) vaccine.** / Consult your health care provider. 1 dose of Td every 10 years.  Varicella vaccine.** / Consult your health care provider.  Zoster vaccine.** / 1 dose for adults aged 78 years or older.  Measles, mumps, rubella (MMR) vaccine.** / You need at least 1 dose of MMR if you were born in 1957 or later. You may also need a second dose.  Pneumococcal 13-valent conjugate (PCV13) vaccine.** / Consult your health care provider.  Pneumococcal polysaccharide (PPSV23) vaccine.** / 1 to 2 doses if you smoke cigarettes or if you have certain conditions.  Meningococcal vaccine.** / Consult your health care provider.  Hepatitis A vaccine.** / Consult your health care provider.  Hepatitis B vaccine.** / Consult your health care provider.  Haemophilus influenzae type b (Hib) vaccine.** / Consult your health care provider. Ages 42 and over  Blood pressure check.** / Every 1 to 2 years.  Lipid and cholesterol check.**/ Every 5 years beginning at age 59.  Lung cancer screening. / Every year if you are aged 20-80 years and have a 30-pack-year history of smoking and currently smoke or have quit within the past 15 years. Yearly screening is stopped once you have quit smoking for at least 15 years or develop a health problem that would prevent you from having lung cancer treatment.  Fecal occult blood test (FOBT) of stool. / Every year beginning at age 9 and continuing until age 35. You may not have to do this test if you get a colonoscopy every 10 years.  Flexible sigmoidoscopy** or colonoscopy.** / Every 5 years for a flexible sigmoidoscopy or every 10 years for a colonoscopy beginning at age 68 and continuing until age 20.  Hepatitis C blood  test.** / For all people born from [birth year] through 1964 and any individual with known risks for hepatitis C.  Abdominal aortic aneurysm (AAA) screening.** / A one-time screening for ages 74 to 71 years who are current or former smokers.  Skin self-exam. / Monthly.  Influenza vaccine. / Every year.  Tetanus, diphtheria, and acellular pertussis (Tdap/Td) vaccine.** / 1 dose of Td every 10 years.  Varicella vaccine.** / Consult your health care provider.  Zoster vaccine.** / 1 dose for adults aged 25 years or older.  Pneumococcal 13-valent conjugate (PCV13) vaccine.** / Consult your health care provider.  Pneumococcal polysaccharide (PPSV23) vaccine.** / 1 dose for all adults aged 32 years and older.  Meningococcal vaccine.** / Consult your health care provider.  Hepatitis A vaccine.** / Consult your health care provider.  Hepatitis B vaccine.** / Consult your health care provider.  Haemophilus influenzae type b (Hib) vaccine.** / Consult your health care provider. **Family history and personal history of risk and conditions may change your health care provider's recommendations. Document Released: 01/26/2002 Document Revised: 12/05/2013 Document Reviewed: 04/27/2011 [facility] Patient Information 2015 [place], [place]. This information is not intended to replace advice given to you by your health care provider. Make sure you discuss any questions you have with your health care provider.

## 2015-04-18 NOTE — Progress Notes (Signed)
Pre visit review using our clinic review tool, if applicable. No additional management support is needed unless otherwise documented below in the visit note. 
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
## 2015-04-29 ENCOUNTER — Other Ambulatory Visit: Payer: Self-pay | Admitting: Neurology

## 2015-05-14 ENCOUNTER — Encounter: Payer: Self-pay | Admitting: Neurology

## 2015-05-14 ENCOUNTER — Ambulatory Visit: Payer: Federal, State, Local not specified - PPO | Admitting: Neurology

## 2015-05-14 ENCOUNTER — Ambulatory Visit (INDEPENDENT_AMBULATORY_CARE_PROVIDER_SITE_OTHER): Payer: Federal, State, Local not specified - PPO | Admitting: Neurology

## 2015-05-14 VITALS — BP 130/82 | HR 76 | Resp 18 | Ht 75.59 in | Wt 196.0 lb

## 2015-05-14 DIAGNOSIS — G4733 Obstructive sleep apnea (adult) (pediatric): Secondary | ICD-10-CM

## 2015-05-14 DIAGNOSIS — G4730 Sleep apnea, unspecified: Secondary | ICD-10-CM

## 2015-05-14 DIAGNOSIS — G4700 Insomnia, unspecified: Secondary | ICD-10-CM | POA: Diagnosis not present

## 2015-05-14 DIAGNOSIS — G513 Clonic hemifacial spasm: Secondary | ICD-10-CM

## 2015-05-14 DIAGNOSIS — Z9989 Dependence on other enabling machines and devices: Secondary | ICD-10-CM

## 2015-05-14 DIAGNOSIS — G5139 Clonic hemifacial spasm, unspecified: Secondary | ICD-10-CM | POA: Insufficient documentation

## 2015-05-14 MED ORDER — MIRTAZAPINE 45 MG PO TABS: 45.0000 mg | ORAL_TABLET | Freq: Every day | ORAL | Status: DC

## 2015-05-14 MED ORDER — TIZANIDINE HCL 4 MG PO TABS: ORAL_TABLET | ORAL | Status: DC
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
## 2015-05-14 NOTE — Patient Instructions (Signed)

## 2015-06-06 ENCOUNTER — Other Ambulatory Visit: Payer: Self-pay | Admitting: Neurology

## 2015-06-06 NOTE — Telephone Encounter (Signed)
Originally prescribed 07/30

## 2015-06-15 ENCOUNTER — Encounter: Payer: Self-pay | Admitting: Family Medicine

## 2015-06-15 DIAGNOSIS — E785 Hyperlipidemia, unspecified: Secondary | ICD-10-CM

## 2015-06-18 NOTE — Telephone Encounter (Signed)
What dosage would you like to change patient to since the 160mg  is no longer an option?  Please advise

## 2015-06-20 MED ORDER — FENOFIBRATE 160 MG PO TABS: 160.0000 mg | ORAL_TABLET | Freq: Every day | ORAL | Status: DC

## 2015-06-20 NOTE — Telephone Encounter (Signed)
Ok to send rx

## 2015-06-20 NOTE — Telephone Encounter (Signed)
Patient would like to stay on the 160. Rx faxed.     KP

## 2015-06-20 NOTE — Telephone Encounter (Signed)
Please advise on the below concerns about dosage.      KP

## 2015-06-20 NOTE — Telephone Encounter (Signed)
105 mg -- please let pt know as well

## 2015-06-27 ENCOUNTER — Encounter: Payer: Self-pay | Admitting: Neurology

## 2015-06-27 ENCOUNTER — Other Ambulatory Visit: Payer: Self-pay | Admitting: Family Medicine

## 2015-06-28 MED ORDER — RAMIPRIL 5 MG PO CAPS: 5.0000 mg | ORAL_CAPSULE | Freq: Two times a day (BID) | ORAL | Status: DC

## 2015-07-01 ENCOUNTER — Other Ambulatory Visit: Payer: Self-pay | Admitting: Family Medicine

## 2015-07-01 MED ORDER — MIRTAZAPINE 45 MG PO TABS: 45.0000 mg | ORAL_TABLET | Freq: Every day | ORAL | Status: DC

## 2015-07-01 MED ORDER — CLONAZEPAM 0.5 MG PO TABS: 0.5000 mg | ORAL_TABLET | Freq: Every evening | ORAL | Status: DC

## 2015-07-01 NOTE — Telephone Encounter (Signed)
Rx has been signed and faxed  

## 2015-07-06 ENCOUNTER — Encounter: Payer: Self-pay | Admitting: Neurology

## 2015-07-08 NOTE — Telephone Encounter (Signed)
The Rx we sent was for #360, up to 4 daily, which is a 90 day Rx.  I called the pharmacy.  Spoke with [employer].  She said the Rx was sent for 90 days, not 120 days, so she is not sure why they were told 120 days.  In any event, since the patient cancelled the order, I had to call in Rx verbally.  Spoke with Clarene Critchley the pharmacist and she verified they will process the Rx as ordered for 90 day supply.  They will reach out to the patient in 24-72 hours regarding delivery.  I have responded to the patient via [place].

## 2015-08-14 ENCOUNTER — Encounter: Payer: Self-pay | Admitting: Family Medicine

## 2015-08-15 ENCOUNTER — Other Ambulatory Visit: Payer: Self-pay | Admitting: Family Medicine

## 2015-08-15 DIAGNOSIS — E785 Hyperlipidemia, unspecified: Secondary | ICD-10-CM

## 2015-08-15 MED ORDER — ATORVASTATIN CALCIUM 20 MG PO TABS: ORAL_TABLET | ORAL | Status: DC

## 2015-10-26 ENCOUNTER — Encounter: Payer: Self-pay | Admitting: Family Medicine

## 2015-10-28 MED ORDER — RAMIPRIL 5 MG PO CAPS: 5.0000 mg | ORAL_CAPSULE | Freq: Two times a day (BID) | ORAL | Status: DC

## 2015-11-13 ENCOUNTER — Telehealth: Payer: Self-pay | Admitting: Neurology

## 2015-11-13 ENCOUNTER — Ambulatory Visit: Payer: Federal, State, Local not specified - PPO | Admitting: Neurology

## 2015-11-13 MED ORDER — MIRTAZAPINE 45 MG PO TABS: 45.0000 mg | ORAL_TABLET | Freq: Every day | ORAL | Status: DC

## 2015-11-13 NOTE — Telephone Encounter (Signed)
[name] with [facility] called for refill for mirtazapine (REMERON) 45 MG tablet . She can be reached at [phone] ref# [id number]

## 2015-11-13 NOTE — Telephone Encounter (Signed)
Rx has been provided.  I spoke with Darrick Meigs at the pharmacy who verified they have the order.

## 2015-11-14 ENCOUNTER — Encounter: Payer: Self-pay | Admitting: Neurology

## 2015-11-14 ENCOUNTER — Ambulatory Visit (INDEPENDENT_AMBULATORY_CARE_PROVIDER_SITE_OTHER): Payer: Federal, State, Local not specified - PPO | Admitting: Neurology

## 2015-11-14 VITALS — BP 118/70 | HR 80 | Resp 20 | Ht 75.5 in | Wt 197.0 lb

## 2015-11-14 DIAGNOSIS — G4733 Obstructive sleep apnea (adult) (pediatric): Secondary | ICD-10-CM

## 2015-11-14 DIAGNOSIS — F959 Tic disorder, unspecified: Secondary | ICD-10-CM

## 2015-11-14 DIAGNOSIS — G4730 Sleep apnea, unspecified: Secondary | ICD-10-CM | POA: Diagnosis not present

## 2015-11-14 DIAGNOSIS — G4700 Insomnia, unspecified: Secondary | ICD-10-CM

## 2015-11-14 DIAGNOSIS — Z9989 Dependence on other enabling machines and devices: Secondary | ICD-10-CM

## 2015-11-14 MED ORDER — CLONAZEPAM 0.5 MG PO TABS: 0.5000 mg | ORAL_TABLET | Freq: Every evening | ORAL | Status: DC

## 2015-11-14 MED ORDER — MIRTAZAPINE 45 MG PO TABS: 45.0000 mg | ORAL_TABLET | Freq: Every day | ORAL | Status: DC

## 2015-11-14 MED ORDER — TIZANIDINE HCL 4 MG PO TABS: ORAL_TABLET | ORAL | Status: DC
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
## 2015-11-15 ENCOUNTER — Other Ambulatory Visit: Payer: Self-pay | Admitting: Neurology

## 2016-01-14 ENCOUNTER — Other Ambulatory Visit: Payer: Self-pay | Admitting: Neurosurgery

## 2016-01-17 NOTE — H&P (Signed)
Patient ID:   [id number] Patient: [name]  Date of Birth: May 22, 1951 Visit Type: Office Visit   Date: 01/13/2016 11:00 AM Provider: Marchia Meiers. Vertell Limber MD   This 65 year old male presents for back pain.  History of Present Illness: 1.  back pain  The patient returns to review the MRI of his shoulder.  This was normal.  There are some minor left-sided degenerative changes.  He feels that his problem relates to his neck and not to his shoulder and this was an opinion which assured by Dr. Gladstone Lighter.  The patient continues to have neck pain.  He has a positive Spurling maneuver to the right.  He is having bilateral upper extremity pain.  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      Medical/Surgical/Interim History Reviewed, no change.  Last detailed document date:09/11/2013.   PAST MEDICAL HISTORY, SURGICAL HISTORY, FAMILY HISTORY, SOCIAL HISTORY AND REVIEW OF SYSTEMS I have reviewed the patient's past medical, surgical, family and social history as well as the comprehensive review of systems as included on the [facility] history form dated 01/13/2016, which I have signed.  Family History: Reviewed, no changes.  Last detailed document: 09/11/2013.   Social History: Tobacco use reviewed. Reviewed, no changes. Last detailed document date: 09/11/2013.      MEDICATIONS(added, continued or stopped this visit): Started Medication Directions Instruction Stopped   Altace 5 mg capsule take 1 capsule by oral route 2 times every day     Aspir-81 81 mg tablet,delayed release take 1 tablet by oral route  every day     Celebrex 200 mg capsule take 1 capsule by oral route  every day as needed     fenofibrate 160 mg tablet take 1 tablet by oral route  every day     finasteride 5 mg tablet take 1 tablet by oral  route  every day     gabapentin 100 mg capsule take 3 capsule by oral route 3 times every day    09/11/2013 hydrocodone 10 mg-acetaminophen 325 mg tablet take 1 tablet by oral route  every 4 - 6 hours as needed for pain     Klonopin 0.5 mg tablet take 1 tablet by oral route  every day     Nitrostat 0.4 mg sublingual tablet Take as directed     Pepcid 20 mg tablet take 1 tablet by oral route  every day     pravastatin 80 mg tablet take 1 tablet by oral route  every day     Remeron 30 mg tablet take 1 tablet by oral route  every day before bedtime    04/25/2014 tizanidine 4 mg tablet TAKE 1 TAB UP TO 4 TIMES A DAY AS NEEDED FOR SPASMS     Toprol XL 25 mg tablet,extended release take 1 tablet by oral route  every day       ALLERGIES: Ingredient Reaction Medication Name Comment  HYDROMORPHONE HCL confusion, aggitation Dilaudid   DOXYCYCLINE     ISOSORBIDE     RIFAXIMIN  Xifaxan   CITALOPRAM HYDROBROMIDE  Celexa   PENICILLINS      Reviewed, updated.    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  Plan on proceeding with posterior stabilization and possible decompression..  The patient is intolerant of Dilaudid and does not want to have that used.  He says it makes him "bonkers"  Completed Orders (this encounter) Order Details Reason Side Interpretation Result Initial Treatment Date Region  Lifestyle education regarding diet Encouraged to eat a well balanced diet and follow up with primary care physician.         Assessment/Plan # Detail Type Description   1. Assessment Pseudoarthrosis of cervical spine, initial encounter (S12.9XXA).       2. Assessment Arthropathy, unspecified (M12.9).       3. Assessment Radiculopathy, cervical region (M54.12).       4. Assessment Cervicalgia (M54.2).       5. Assessment Body mass index (BMI) 25.0-25.9, adult [id number]).   Plan Orders  Today's instructions / counseling include(s) Lifestyle education regarding diet.         Pain Assessment/Treatment Pain Scale: 4/10. Method: Numeric Pain Intensity Scale. Location: back. Onset: 10/27/2014. Duration: varies. Quality: discomforting. Pain Assessment/Treatment follow-up plan of care: Patient is taking medications as prescribed..  The patient will proceed with posterior cervical decompression and stabilization surgery on 01/23/16.  Risks and benefits were discussed in detail with patient and patient teaching was performed.  He wishes to proceed.  Orders: Diagnostic Procedures: Assessment Procedure  M54.2 C6-C7 posterior cervical fusion, possible decompression  M54.2 Cervical Spine- AP/Lat  Instruction(s)/Education: Assessment Instruction  [phone] Lifestyle education regarding diet             Provider:  Marchia Meiers. Vertell Limber MD  01/19/2016 02:38 PM Dictation edited by: Marchia Meiers. Vertell Limber    CC Providers: [name] [facility] [address] [place],  [place]  [phone]-   Yvonne Lowne [place] [address] [place], [place] [address]-              Electronically signed by Marchia Meiers. Vertell Limber MD on 01/19/2016 02:38 PM

## 2016-01-20 ENCOUNTER — Other Ambulatory Visit (HOSPITAL_COMMUNITY): Payer: Self-pay | Admitting: *Deleted

## 2016-01-20 NOTE — Pre-Procedure Instructions (Signed)
    JAFARI MORRICAL  01/20/2016      [id number] [place], [address] [place] [address] Phone: [phone] Fax: [phone]  [facility] [place] [place] [address] Phone: [phone] Fax: [phone]    Your procedure is scheduled on Thursday February 9th  Report to [facility] Admitting a 8:15 A.M.  Call this number if you have problems the morning of surgery:  [phone]   Remember:  Do not eat food or drink liquids after midnight.  Take these medicines the morning of surgery with A SIP OF WATER Famotidine (Pepcid), gabapentin (neurontin), Metoprolol (Toprol XL)), Tizanidine (Zanaflex) if needed, Tramadol if needed, Valtrex if needed  Stop taking any aspirin or aspirin containing products, NSAIDS, Aleve, Ibuprofen, Motrin, Naproxen, Vitamins and herbal products.   Do not wear jewelry, make-up or nail polish.  Do not wear lotions, powders, or perfumes.    Do not shave 48 hours prior to surgery.  Men may shave face and neck.   Do not bring valuables to the hospital.  [facility] is not responsible for any belongings or valuables.  Contacts, dentures or bridgework may not be worn into surgery.  Leave your suitcase in the car.  After surgery it may be brought to your room.  For patients admitted to the hospital, discharge time will be determined by your treatment team.  Patients discharged the day of surgery will not be allowed to drive home.     Please read over the following fact sheets that you were given. Pain Booklet, Coughing and Deep Breathing, Blood Transfusion Information and Surgical Site Infection Prevention

## 2016-01-21 ENCOUNTER — Telehealth: Payer: Self-pay | Admitting: Cardiology

## 2016-01-21 ENCOUNTER — Encounter (HOSPITAL_COMMUNITY): Payer: Self-pay

## 2016-01-21 ENCOUNTER — Encounter (HOSPITAL_COMMUNITY)
Admission: RE | Admit: 2016-01-21 | Discharge: 2016-01-21 | Disposition: A | Discharge status: Home / Self Care | Payer: Federal, State, Local not specified - PPO | Source: Ambulatory Visit | Attending: Neurosurgery | Admitting: Neurosurgery

## 2016-01-21 HISTORY — DX: Aortic aneurysm of unspecified site, without rupture: I71.9

## 2016-01-21 LAB — BASIC METABOLIC PANEL
Anion gap: 11 (ref 5–15)
BUN: 15 mg/dL (ref 6–20)
CO2: 27 mmol/L (ref 22–32)
Calcium: 9.6 mg/dL (ref 8.9–10.3)
Chloride: 103 mmol/L (ref 101–111)
Creatinine, Ser: 1.27 mg/dL — ABNORMAL HIGH (ref 0.61–1.24)
GFR calc Af Amer: >60 mL/min (ref >60)
GFR calc non Af Amer: 58 mL/min — ABNORMAL LOW (ref >60)
Glucose, Bld: 92 mg/dL (ref 65–99)
Potassium: 3.9 mmol/L (ref 3.5–5.1)
Sodium: 141 mmol/L (ref 135–145)

## 2016-01-21 LAB — TYPE AND SCREEN
ABO/RH(D): O POS
Antibody Screen: NEGATIVE

## 2016-01-21 LAB — CBC
HCT: 35.4 % — ABNORMAL LOW (ref 39.0–52.0)
Hemoglobin: 12.3 g/dL — ABNORMAL LOW (ref 13.0–17.0)
MCH: 32.2 pg (ref 26.0–34.0)
MCHC: 34.7 g/dL (ref 30.0–36.0)
MCV: 92.7 fL (ref 78.0–100.0)
Platelets: 170 10*3/uL (ref 150–400)
RBC: 3.82 MIL/uL — ABNORMAL LOW (ref 4.22–5.81)
RDW: 12.8 % (ref 11.5–15.5)
WBC: 4.4 10*3/uL (ref 4.0–10.5)

## 2016-01-21 LAB — SURGICAL PCR SCREEN
MRSA, PCR: NEGATIVE
Staphylococcus aureus: POSITIVE — AB
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
## 2016-01-21 NOTE — Telephone Encounter (Signed)
Request for surgical clearance:  What type of surgery is being performed? C-6-7 Posterior cervical fusion , possible decompression   1. When is this surgery scheduled? 01/23/16  Are there any medications that need to be held prior to surgery and how long? No  Name of physician performing surgery? StephenSterns   2. What is your office phone and fax number? [phone]  [name] is calling because [facility] is wanting someone to take a look at the EKG that was done on today and to message [name] as well as reach out to [name] office to [place] at [phone]

## 2016-01-21 NOTE — Telephone Encounter (Signed)
I am unable to close this encounter.

## 2016-01-21 NOTE — Progress Notes (Addendum)
Anesthesia PAT evaluation: Patient is a 65 year old male scheduled for C6-7 posterior cervical fusion, possible decompression on 01/23/2016 by Dr. Vertell Limber.  History includes CAD s/p CABG (LIMA to LAD, SVG to Ramus INT, SVG to OM1 and OM2, SVG to RCA and PDA by Dr. Prescott [name]) 09/17/01, AAA, remote history of syncope, former smoker, HLD, HTN, insomnia, headaches, OSA with CPAP use (Dr. Brett [name]), GERD s/p Nissen fundoplication 02, facial tic, cervical fracture (s/p C2-7 fusion; five c-spine surgeries), L3-5 anterolateral fusion '13.  PCP is Dr. Etter [name], last visit 04/18/15. Cardiologist is Dr. Aundra [name], last visit 04/01/14. Six month follow-up recommended.   Meds include Uroxatral, aspirin 81 mg, Lipitor, Klonopin, Pepcid, fenofibrate, Neurontin, Krill oil, Toprol-XL, Remeron, nitroglycerin, ramipril, Zanaflex, tramadol, turmeric, Valtrex PRN.  PAT vitals: BP 101/67, HR 57, RR 20, T 36.5 Celsius, O2 sat 99%. Exam shows a pleasant Caucasian male in NAD. BMI 23.80. Heart RRR, no murmur noted. No carotid bruits heard. Lungs clear. No ankle edema. He denied chest pain since 2012. No Nitro use. No new fatigue or SOB. No edema. He says he stays active. His wife has myotonic dystrophy, and her activity is limited. He does all of the yardwork (mowing and trimming takes about 3 hours) and a lot of the house chores. He is able to due mowing (riding lawnmower), trimming, vacuuming, laundry, dishes without CV symptoms.  01/21/16/ EKG: SB at 50 bpm, first degree AVB, abnormal QRS-T angle, consider primary T wave abnormality (most pronounced in inferior leads, non-specific in lateral leads). Lateral T wave abnormality is less pronounced when compared to 04-08-14 tracing.  Dr. Claris [name] 03/2014 [name] note mentions a cardiac cath in 2012, which is also when patient thinks he had his last cardiac cath. I cannot locate a 2012 report, so request sent to [name]. Per notes: LHC (5/12) with EF 50%; LIMA-LAD atretic, SVG-OM patent,  SVG-ramus patent, seq SVG-RCA and PDA with RCA arm patent and PDA arm occluded.   Nuclear stress test on 02/16/11 (done for complaints of angina) showed: Overall Impression Comments: Inferior defect consistent with soft tissue attenuation and very mild ischemia. Does not appear to be significant quantitatively. LVEF 67%. Following his stress test his chest pain improved, so Dr. Lia [name] ultimately decided to treat [name] medically.   Cardiac cath on 06/06/10 showed: 1. Severe three-vessel coronary artery disease which is unchanged.  2. 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  3. The saphenous vein sequential graft to the first obtuse marginal artery and second obtuse marginal artery has some mild plaquing in it. The first limb is chronically occluded. There is a 50% lesion in the proximal part of the graft to moderate diffuse disease throughout the middle of the graft.  4. Saphenous vein Y-graft to the right coronary artery and posterior descending artery is patent. The posterior descending artery is tiny and has dye hang-up in the graft which is chronic. Saphenous vein graft to the ramus is patent.  5. Left ventricular functions with an ejection fraction of 45-50%.  Continued medical therapy was recommended.  05/10/08 Echo:SUMMARY - Overall left ventricular systolic function was normal. Left    ventricular ejection fraction was estimated , range being 60    % to 65 %. There was focal basal septal hypertrophy that    measures 24 mm then quickly tapers. Trivial MR/PR.  03/01/15 AAA U/S: Stable dimensions of infrarenal fusiform AAA at 3.6 cm x 3.7 cm. Normal caliber common and external iliac arteries bilaterally. Follow-up one year recommended.  Preoperative labs noted.   Above discussed with anesthesiologist Dr. Marcie [name]. His EKG appears stable. His reported activity seems to be at least 4 METS. However, with known CAD with occluded vein grafts, no functional study  since 2012 and nearly two years since he saw cardiology he will need cardiac clearance. I told [name] that since his last visit was in 2015 that I thought Dr. Aundra [name] may want [name] re-evaluated in the [name] and if that could not be arranged then it was possible his surgery may be delayed. We would defer decision for additional testing to cardiology. He is understandable upset because he has been dealing with left shoulder pain (present since shoulder surgery 4 years ago, but now worsening) for 18 months now. I have notified [name]. Patient also contacted Dr. Claris [name] [name].   [name] [phone] 01/21/2016 11:18 AM  Addendum: Patient was seen by Stephen Dopp, PA-C this morning for follow-up and pre-operative cardiology clearance. Repeat aortic U/S recommended for 03/2016. In regards to clearance, he writes, "Surgical Clearance - The patient does not have any unstable cardiac conditions. Nuclear stress test in 2012 is low risk. Upon evaluation today, he can achieve 4 METs or greater without anginal symptoms. According to [facility] and AHA guidelines, he requires no further cardiac workup prior to his noncardiac surgery and should be at acceptable risk. Our service is available as necessary in the perioperative period."   Also [name] only had records of the 05/2010 cardiac cath--no 2012 cath report found.  Stephen Hugh [facility] Short Stay Center/Anesthesiology [name] [phone] 01/22/2016 11:56 AM

## 2016-01-21 NOTE — Telephone Encounter (Signed)
[name] at Dr Melven Sartorius office states anesthesiology requesting cardiac clearance for surgery 01/23/16.  [name] states pt not having any active cardiac symptoms. [name] advised pt would need office visit to clear for surgery since pt's last office visit with Dr Aundra Dubin was April 2015. [name] advised pt has been scheduled to see [name] 01/22/16, that he may not be able to clear pt  for surgery by 01/23/16. Jessica faxing Dr Melven Sartorius notes to our office.  [name] states she advise pt about appt 01/22/16 with [name] and status of cardiac clearance.

## 2016-01-21 NOTE — Progress Notes (Addendum)
PCP:Bonney Roussel @[facility] Cardiologist: Dr. Aundra Dubin Neurology: Dr. Brett Fairy  States sleep study done .5 yrs.Dr. Brett Fairy folloews pt.  Called in mupirocin prescription at [facility] in [facility]

## 2016-01-22 ENCOUNTER — Encounter: Payer: Self-pay | Admitting: Physician Assistant

## 2016-01-22 ENCOUNTER — Ambulatory Visit (INDEPENDENT_AMBULATORY_CARE_PROVIDER_SITE_OTHER): Payer: Federal, State, Local not specified - PPO | Admitting: Physician Assistant

## 2016-01-22 VITALS — BP 120/62 | HR 70 | Ht 75.5 in | Wt 195.8 lb

## 2016-01-22 DIAGNOSIS — I10 Essential (primary) hypertension: Secondary | ICD-10-CM

## 2016-01-22 DIAGNOSIS — I714 Abdominal aortic aneurysm, without rupture, unspecified: Secondary | ICD-10-CM

## 2016-01-22 DIAGNOSIS — Z01810 Encounter for preprocedural cardiovascular examination: Secondary | ICD-10-CM | POA: Diagnosis not present

## 2016-01-22 DIAGNOSIS — I2510 Atherosclerotic heart disease of native coronary artery without angina pectoris: Secondary | ICD-10-CM | POA: Diagnosis not present

## 2016-01-22 DIAGNOSIS — E785 Hyperlipidemia, unspecified: Secondary | ICD-10-CM

## 2016-01-22 MED ORDER — VANCOMYCIN HCL IN DEXTROSE 1-5 GM/200ML-% IV SOLN
1000.0000 mg | INTRAVENOUS | Status: AC
  Administered 2016-01-23: 1000 mg via INTRAVENOUS
  Filled 2016-01-22: qty 200

## 2016-01-22 NOTE — Patient Instructions (Addendum)
Medication Instructions:  Your physician recommends that you continue on your current medications as directed. Please refer to the Current Medication list given to you today.  Labwork: NONE  Testing/Procedures: Your physician has requested that you have an abdominal aorta duplex THIS IS TO BE DONE AFTER 03/2016. During this test, an ultrasound is used to evaluate the aorta. Allow 30 minutes for this exam. Do not eat after midnight the day before and avoid carbonated beverages  Follow-Up: Your physician wants you to follow-up in: [place] Aundra Dubin. You will receive a reminder letter in the mail two months in advance. If you don't receive a letter, please call our office to schedule the follow-up appointment.  Any Other Special Instructions Will Be Listed Below (If Applicable).  If you need a refill on your cardiac medications before your next appointment, please call your pharmacy.

## 2016-01-23 ENCOUNTER — Encounter (HOSPITAL_COMMUNITY)
Admission: RE | Disposition: A | Discharge status: Home / Self Care | Payer: Self-pay | Source: Ambulatory Visit | Attending: Neurosurgery

## 2016-01-23 ENCOUNTER — Inpatient Hospital Stay (HOSPITAL_COMMUNITY): Payer: Federal, State, Local not specified - PPO

## 2016-01-23 ENCOUNTER — Inpatient Hospital Stay (HOSPITAL_COMMUNITY)
Admission: RE | Admit: 2016-01-23 | Discharge: 2016-01-24 | DRG: 472 | Disposition: A | Discharge status: Home / Self Care | Payer: Federal, State, Local not specified - PPO | Source: Ambulatory Visit | Attending: Neurosurgery | Admitting: Neurosurgery

## 2016-01-23 ENCOUNTER — Inpatient Hospital Stay (HOSPITAL_COMMUNITY): Payer: Federal, State, Local not specified - PPO | Admitting: Anesthesiology

## 2016-01-23 ENCOUNTER — Inpatient Hospital Stay (HOSPITAL_COMMUNITY): Payer: Federal, State, Local not specified - PPO | Admitting: Vascular Surgery

## 2016-01-23 ENCOUNTER — Encounter (HOSPITAL_COMMUNITY): Payer: Self-pay | Admitting: Anesthesiology

## 2016-01-23 DIAGNOSIS — K219 Gastro-esophageal reflux disease without esophagitis: Secondary | ICD-10-CM | POA: Diagnosis present

## 2016-01-23 DIAGNOSIS — T84216A Breakdown (mechanical) of internal fixation device of vertebrae, initial encounter: Secondary | ICD-10-CM | POA: Diagnosis not present

## 2016-01-23 DIAGNOSIS — I739 Peripheral vascular disease, unspecified: Secondary | ICD-10-CM | POA: Diagnosis present

## 2016-01-23 DIAGNOSIS — F419 Anxiety disorder, unspecified: Secondary | ICD-10-CM | POA: Diagnosis present

## 2016-01-23 DIAGNOSIS — I714 Abdominal aortic aneurysm, without rupture: Secondary | ICD-10-CM | POA: Diagnosis present

## 2016-01-23 DIAGNOSIS — E782 Mixed hyperlipidemia: Secondary | ICD-10-CM | POA: Diagnosis present

## 2016-01-23 DIAGNOSIS — I10 Essential (primary) hypertension: Secondary | ICD-10-CM | POA: Diagnosis present

## 2016-01-23 DIAGNOSIS — M4722 Other spondylosis with radiculopathy, cervical region: Secondary | ICD-10-CM | POA: Diagnosis present

## 2016-01-23 DIAGNOSIS — Y838 Other surgical procedures as the cause of abnormal reaction of the patient, or of later complication, without mention of misadventure at the time of the procedure: Secondary | ICD-10-CM | POA: Diagnosis present

## 2016-01-23 DIAGNOSIS — Y92019 Unspecified place in single-family (private) house as the place of occurrence of the external cause: Secondary | ICD-10-CM

## 2016-01-23 DIAGNOSIS — Z419 Encounter for procedure for purposes other than remedying health state, unspecified: Secondary | ICD-10-CM

## 2016-01-23 DIAGNOSIS — S129XXA Fracture of neck, unspecified, initial encounter: Secondary | ICD-10-CM | POA: Diagnosis present

## 2016-01-23 DIAGNOSIS — I2510 Atherosclerotic heart disease of native coronary artery without angina pectoris: Secondary | ICD-10-CM | POA: Diagnosis not present

## 2016-01-23 DIAGNOSIS — M4692 Unspecified inflammatory spondylopathy, cervical region: Secondary | ICD-10-CM | POA: Diagnosis present

## 2016-01-23 DIAGNOSIS — M960 Pseudarthrosis after fusion or arthrodesis: Secondary | ICD-10-CM | POA: Diagnosis not present

## 2016-01-23 DIAGNOSIS — I252 Old myocardial infarction: Secondary | ICD-10-CM

## 2016-01-23 DIAGNOSIS — Z951 Presence of aortocoronary bypass graft: Secondary | ICD-10-CM

## 2016-01-23 DIAGNOSIS — Z87891 Personal history of nicotine dependence: Secondary | ICD-10-CM

## 2016-01-23 DIAGNOSIS — G4730 Sleep apnea, unspecified: Secondary | ICD-10-CM | POA: Diagnosis present

## 2016-01-23 HISTORY — PX: POSTERIOR CERVICAL FUSION/FORAMINOTOMY: SHX5038

## 2016-01-23 SURGERY — POSTERIOR CERVICAL FUSION/FORAMINOTOMY LEVEL 1
Anesthesia: General | Site: Neck

## 2016-01-23 MED ORDER — NITROGLYCERIN 0.4 MG SL SUBL: 0.4000 mg | SUBLINGUAL_TABLET | SUBLINGUAL | Status: DC | PRN

## 2016-01-23 MED ORDER — MENTHOL 3 MG MT LOZG
1.0000 | LOZENGE | OROMUCOSAL | Status: DC | PRN
Start: 2016-01-23 — End: 2016-01-24

## 2016-01-23 MED ORDER — MORPHINE SULFATE (PF) 4 MG/ML IV SOLN
4.0000 mg | Freq: Once | INTRAVENOUS | Status: AC
  Administered 2016-01-23: 4 mg via INTRAVENOUS

## 2016-01-23 MED ORDER — MORPHINE SULFATE (PF) 2 MG/ML IV SOLN
2.0000 mg | INTRAVENOUS | Status: DC | PRN
  Administered 2016-01-24 (×2): 4 mg via INTRAVENOUS
  Filled 2016-01-23 (×2): qty 2

## 2016-01-23 MED ORDER — KRILL OIL 1000 MG PO CAPS: 500.0000 mg | ORAL_CAPSULE | Freq: Every day | ORAL | Status: DC

## 2016-01-23 MED ORDER — LIDOCAINE HCL (CARDIAC) 20 MG/ML IV SOLN
INTRAVENOUS | Status: AC
  Filled 2016-01-23: qty 5

## 2016-01-23 MED ORDER — ROCURONIUM BROMIDE 50 MG/5ML IV SOLN
INTRAVENOUS | Status: AC
  Filled 2016-01-23: qty 1

## 2016-01-23 MED ORDER — DOCUSATE SODIUM 100 MG PO CAPS
100.0000 mg | ORAL_CAPSULE | Freq: Two times a day (BID) | ORAL | Status: DC
  Administered 2016-01-23: 100 mg via ORAL
  Filled 2016-01-23: qty 1

## 2016-01-23 MED ORDER — HEMOSTATIC AGENTS (NO CHARGE) OPTIME
TOPICAL | Status: DC | PRN
  Administered 2016-01-23: 1 via TOPICAL

## 2016-01-23 MED ORDER — METHOCARBAMOL 1000 MG/10ML IJ SOLN
500.0000 mg | INTRAVENOUS | Status: AC
  Administered 2016-01-23: 500 mg via INTRAVENOUS
  Filled 2016-01-23: qty 5

## 2016-01-23 MED ORDER — METHOCARBAMOL 500 MG PO TABS
500.0000 mg | ORAL_TABLET | Freq: Four times a day (QID) | ORAL | Status: DC | PRN
  Administered 2016-01-23: 500 mg via ORAL
  Filled 2016-01-23 (×2): qty 1

## 2016-01-23 MED ORDER — SODIUM CHLORIDE 0.9% FLUSH: 3.0000 mL | INTRAVENOUS | Status: DC | PRN

## 2016-01-23 MED ORDER — CLONAZEPAM 0.5 MG PO TABS
0.5000 mg | ORAL_TABLET | Freq: Every evening | ORAL | Status: DC
Start: 2016-01-23 — End: 2016-01-24
  Administered 2016-01-23: 0.5 mg via ORAL
  Filled 2016-01-23: qty 1

## 2016-01-23 MED ORDER — MIDAZOLAM HCL 2 MG/2ML IJ SOLN
INTRAMUSCULAR | Status: AC
  Filled 2016-01-23: qty 2

## 2016-01-23 MED ORDER — MUPIROCIN 2 % EX OINT: 1.0000 "application " | TOPICAL_OINTMENT | Freq: Two times a day (BID) | CUTANEOUS | Status: DC

## 2016-01-23 MED ORDER — HYDROMORPHONE HCL 1 MG/ML IJ SOLN
0.5000 mg | INTRAMUSCULAR | Status: DC | PRN
  Filled 2016-01-23: qty 1

## 2016-01-23 MED ORDER — OXYCODONE-ACETAMINOPHEN 5-325 MG PO TABS
1.0000 | ORAL_TABLET | ORAL | Status: DC | PRN
  Administered 2016-01-23 – 2016-01-24 (×3): 2 via ORAL
  Filled 2016-01-23 (×3): qty 2

## 2016-01-23 MED ORDER — THROMBIN 5000 UNITS EX SOLR
CUTANEOUS | Status: DC | PRN
  Administered 2016-01-23 (×2): 5000 [IU] via TOPICAL

## 2016-01-23 MED ORDER — DEXAMETHASONE SODIUM PHOSPHATE 4 MG/ML IJ SOLN
INTRAMUSCULAR | Status: AC
  Filled 2016-01-23: qty 1

## 2016-01-23 MED ORDER — BACITRACIN ZINC 500 UNIT/GM EX OINT
TOPICAL_OINTMENT | CUTANEOUS | Status: DC | PRN
  Administered 2016-01-23: 1 via TOPICAL

## 2016-01-23 MED ORDER — MIRTAZAPINE 45 MG PO TABS
45.0000 mg | ORAL_TABLET | Freq: Every day | ORAL | Status: DC
  Administered 2016-01-23: 45 mg via ORAL
  Filled 2016-01-23 (×2): qty 1

## 2016-01-23 MED ORDER — BISACODYL 10 MG RE SUPP: 10.0000 mg | Freq: Every day | RECTAL | Status: DC | PRN

## 2016-01-23 MED ORDER — TRIAMCINOLONE ACETONIDE 0.1 % EX CREA: 1.0000 "application " | TOPICAL_CREAM | Freq: Every day | CUTANEOUS | Status: DC | PRN

## 2016-01-23 MED ORDER — LIDOCAINE HCL (CARDIAC) 20 MG/ML IV SOLN
INTRAVENOUS | Status: DC | PRN
  Administered 2016-01-23: 80 mg via INTRAVENOUS

## 2016-01-23 MED ORDER — FLEET ENEMA 7-19 GM/118ML RE ENEM: 1.0000 | ENEMA | Freq: Once | RECTAL | Status: DC | PRN

## 2016-01-23 MED ORDER — MORPHINE SULFATE (PF) 2 MG/ML IV SOLN
INTRAVENOUS | Status: AC
  Filled 2016-01-23: qty 1

## 2016-01-23 MED ORDER — MEPERIDINE HCL 25 MG/ML IJ SOLN
INTRAMUSCULAR | Status: AC
  Filled 2016-01-23: qty 1

## 2016-01-23 MED ORDER — GABAPENTIN 100 MG PO CAPS: 100.0000 mg | ORAL_CAPSULE | Freq: Two times a day (BID) | ORAL | Status: DC

## 2016-01-23 MED ORDER — MORPHINE SULFATE (PF) 2 MG/ML IV SOLN
INTRAVENOUS | Status: AC
  Filled 2016-01-23: qty 2

## 2016-01-23 MED ORDER — FENOFIBRATE 160 MG PO TABS
160.0000 mg | ORAL_TABLET | Freq: Every day | ORAL | Status: DC
  Administered 2016-01-23: 160 mg via ORAL
  Filled 2016-01-23: qty 1

## 2016-01-23 MED ORDER — ALUM & MAG HYDROXIDE-SIMETH 200-200-20 MG/5ML PO SUSP: 30.0000 mL | Freq: Four times a day (QID) | ORAL | Status: DC | PRN

## 2016-01-23 MED ORDER — FENTANYL CITRATE (PF) 100 MCG/2ML IJ SOLN
INTRAMUSCULAR | Status: DC | PRN
  Administered 2016-01-23: 100 ug via INTRAVENOUS
  Administered 2016-01-23 (×3): 50 ug via INTRAVENOUS

## 2016-01-23 MED ORDER — GABAPENTIN 300 MG PO CAPS
300.0000 mg | ORAL_CAPSULE | Freq: Every day | ORAL | Status: DC
  Administered 2016-01-23: 300 mg via ORAL
  Filled 2016-01-23: qty 1

## 2016-01-23 MED ORDER — PROPOFOL 10 MG/ML IV BOLUS
INTRAVENOUS | Status: DC | PRN
  Administered 2016-01-23: 155 mg via INTRAVENOUS

## 2016-01-23 MED ORDER — SUGAMMADEX SODIUM 200 MG/2ML IV SOLN
INTRAVENOUS | Status: AC
  Filled 2016-01-23: qty 2

## 2016-01-23 MED ORDER — ONDANSETRON HCL 4 MG/2ML IJ SOLN
INTRAMUSCULAR | Status: AC
  Filled 2016-01-23: qty 2

## 2016-01-23 MED ORDER — ZOLPIDEM TARTRATE 5 MG PO TABS: 5.0000 mg | ORAL_TABLET | Freq: Every evening | ORAL | Status: DC | PRN

## 2016-01-23 MED ORDER — MIDAZOLAM HCL 5 MG/5ML IJ SOLN
INTRAMUSCULAR | Status: DC | PRN
  Administered 2016-01-23: 2 mg via INTRAVENOUS

## 2016-01-23 MED ORDER — FAMOTIDINE 20 MG PO TABS
20.0000 mg | ORAL_TABLET | Freq: Every day | ORAL | Status: DC
  Administered 2016-01-23: 20 mg via ORAL
  Filled 2016-01-23: qty 1

## 2016-01-23 MED ORDER — SUGAMMADEX SODIUM 200 MG/2ML IV SOLN
INTRAVENOUS | Status: DC | PRN
  Administered 2016-01-23: 200 mg via INTRAVENOUS

## 2016-01-23 MED ORDER — TIZANIDINE HCL 2 MG PO TABS
2.0000 mg | ORAL_TABLET | Freq: Once | ORAL | Status: AC
  Administered 2016-01-23: 2 mg via ORAL
  Filled 2016-01-23: qty 1

## 2016-01-23 MED ORDER — METHOCARBAMOL 1000 MG/10ML IJ SOLN
500.0000 mg | Freq: Four times a day (QID) | INTRAVENOUS | Status: DC | PRN
  Filled 2016-01-23: qty 5

## 2016-01-23 MED ORDER — ONDANSETRON HCL 4 MG/2ML IJ SOLN: 4.0000 mg | INTRAMUSCULAR | Status: DC | PRN

## 2016-01-23 MED ORDER — KCL IN DEXTROSE-NACL 20-5-0.45 MEQ/L-%-% IV SOLN
INTRAVENOUS | Status: DC
  Filled 2016-01-23: qty 1000

## 2016-01-23 MED ORDER — SUCCINYLCHOLINE CHLORIDE 20 MG/ML IJ SOLN
INTRAMUSCULAR | Status: DC | PRN
  Administered 2016-01-23: 140 mg via INTRAVENOUS

## 2016-01-23 MED ORDER — LACTATED RINGERS IV SOLN
INTRAVENOUS | Status: DC
  Administered 2016-01-23 (×2): via INTRAVENOUS

## 2016-01-23 MED ORDER — GABAPENTIN 100 MG PO CAPS: 100.0000 mg | ORAL_CAPSULE | Freq: Three times a day (TID) | ORAL | Status: DC

## 2016-01-23 MED ORDER — PHENYLEPHRINE HCL 10 MG/ML IJ SOLN
INTRAMUSCULAR | Status: AC
  Filled 2016-01-23: qty 1

## 2016-01-23 MED ORDER — MORPHINE SULFATE (PF) 4 MG/ML IV SOLN
INTRAVENOUS | Status: AC
  Administered 2016-01-23: 4 mg
  Filled 2016-01-23: qty 1

## 2016-01-23 MED ORDER — TIZANIDINE HCL 4 MG PO TABS
4.0000 mg | ORAL_TABLET | Freq: Four times a day (QID) | ORAL | Status: DC | PRN
  Administered 2016-01-23: 4 mg via ORAL
  Filled 2016-01-23: qty 1

## 2016-01-23 MED ORDER — POLYETHYLENE GLYCOL 3350 17 G PO PACK: 17.0000 g | PACK | Freq: Every day | ORAL | Status: DC | PRN

## 2016-01-23 MED ORDER — SUCCINYLCHOLINE CHLORIDE 20 MG/ML IJ SOLN
INTRAMUSCULAR | Status: AC
  Filled 2016-01-23: qty 1

## 2016-01-23 MED ORDER — ALFUZOSIN HCL ER 10 MG PO TB24
10.0000 mg | ORAL_TABLET | Freq: Every day | ORAL | Status: DC
  Filled 2016-01-23: qty 1

## 2016-01-23 MED ORDER — LIDOCAINE-EPINEPHRINE 1 %-1:100000 IJ SOLN
INTRAMUSCULAR | Status: DC | PRN
  Administered 2016-01-23: 5 mL

## 2016-01-23 MED ORDER — VANCOMYCIN HCL IN DEXTROSE 1-5 GM/200ML-% IV SOLN
1000.0000 mg | Freq: Once | INTRAVENOUS | Status: AC
Start: 2016-01-23 — End: 2016-01-23
  Administered 2016-01-23: 1000 mg via INTRAVENOUS
  Filled 2016-01-23: qty 200

## 2016-01-23 MED ORDER — TURMERIC 500 MG PO CAPS: 500.0000 mg | ORAL_CAPSULE | Freq: Every day | ORAL | Status: DC

## 2016-01-23 MED ORDER — FENTANYL CITRATE (PF) 250 MCG/5ML IJ SOLN
INTRAMUSCULAR | Status: AC
  Filled 2016-01-23: qty 5

## 2016-01-23 MED ORDER — PHENYLEPHRINE HCL 10 MG/ML IJ SOLN
10.0000 mg | INTRAVENOUS | Status: DC | PRN
  Administered 2016-01-23: 25 ug/min via INTRAVENOUS
  Administered 2016-01-23: 10 ug/min via INTRAVENOUS

## 2016-01-23 MED ORDER — GABAPENTIN 300 MG PO CAPS: 300.0000 mg | ORAL_CAPSULE | Freq: Every day | ORAL | Status: DC

## 2016-01-23 MED ORDER — PROMETHAZINE HCL 25 MG/ML IJ SOLN: 6.2500 mg | INTRAMUSCULAR | Status: DC | PRN

## 2016-01-23 MED ORDER — ATORVASTATIN CALCIUM 20 MG PO TABS
20.0000 mg | ORAL_TABLET | Freq: Every day | ORAL | Status: DC
  Filled 2016-01-23: qty 1

## 2016-01-23 MED ORDER — ONDANSETRON HCL 4 MG/2ML IJ SOLN
INTRAMUSCULAR | Status: DC | PRN
  Administered 2016-01-23: 4 mg via INTRAVENOUS

## 2016-01-23 MED ORDER — PROPOFOL 10 MG/ML IV BOLUS
INTRAVENOUS | Status: AC
  Filled 2016-01-23: qty 20

## 2016-01-23 MED ORDER — 0.9 % SODIUM CHLORIDE (POUR BTL) OPTIME
TOPICAL | Status: DC | PRN
  Administered 2016-01-23: 1000 mL

## 2016-01-23 MED ORDER — RAMIPRIL 5 MG PO CAPS
5.0000 mg | ORAL_CAPSULE | Freq: Two times a day (BID) | ORAL | Status: DC
  Administered 2016-01-23: 5 mg via ORAL
  Filled 2016-01-23 (×3): qty 1

## 2016-01-23 MED ORDER — CO Q 10 100 MG PO CAPS: 100.0000 mg | ORAL_CAPSULE | Freq: Every day | ORAL | Status: DC

## 2016-01-23 MED ORDER — SODIUM CHLORIDE 0.9 % IV SOLN: 250.0000 mL | INTRAVENOUS | Status: DC

## 2016-01-23 MED ORDER — TIZANIDINE HCL 4 MG PO TABS: 4.0000 mg | ORAL_TABLET | Freq: Four times a day (QID) | ORAL | Status: DC | PRN

## 2016-01-23 MED ORDER — NEOSTIGMINE METHYLSULFATE 10 MG/10ML IV SOLN
INTRAVENOUS | Status: AC
  Filled 2016-01-23: qty 1

## 2016-01-23 MED ORDER — ROCURONIUM BROMIDE 100 MG/10ML IV SOLN
INTRAVENOUS | Status: DC | PRN
  Administered 2016-01-23: 50 mg via INTRAVENOUS
  Administered 2016-01-23 (×2): 20 mg via INTRAVENOUS

## 2016-01-23 MED ORDER — MORPHINE SULFATE (PF) 4 MG/ML IV SOLN
INTRAVENOUS | Status: AC
  Administered 2016-01-23: 21:00:00
  Filled 2016-01-23: qty 1

## 2016-01-23 MED ORDER — PANTOPRAZOLE SODIUM 40 MG IV SOLR
40.0000 mg | Freq: Every day | INTRAVENOUS | Status: DC
  Administered 2016-01-23: 40 mg via INTRAVENOUS
  Filled 2016-01-23: qty 40

## 2016-01-23 MED ORDER — ACETAMINOPHEN 650 MG RE SUPP: 650.0000 mg | RECTAL | Status: DC | PRN

## 2016-01-23 MED ORDER — TRAMADOL HCL 50 MG PO TABS: 50.0000 mg | ORAL_TABLET | Freq: Three times a day (TID) | ORAL | Status: DC | PRN

## 2016-01-23 MED ORDER — GLYCOPYRROLATE 0.2 MG/ML IJ SOLN
INTRAMUSCULAR | Status: AC
  Filled 2016-01-23: qty 3

## 2016-01-23 MED ORDER — MUPIROCIN 2 % EX OINT
1.0000 "application " | TOPICAL_OINTMENT | Freq: Two times a day (BID) | CUTANEOUS | Status: DC
  Administered 2016-01-23: 1 via TOPICAL

## 2016-01-23 MED ORDER — HYDROCODONE-ACETAMINOPHEN 5-325 MG PO TABS: 1.0000 | ORAL_TABLET | ORAL | Status: DC | PRN

## 2016-01-23 MED ORDER — VALACYCLOVIR HCL 500 MG PO TABS: 500.0000 mg | ORAL_TABLET | Freq: Two times a day (BID) | ORAL | Status: DC | PRN

## 2016-01-23 MED ORDER — BUPIVACAINE HCL (PF) 0.5 % IJ SOLN
INTRAMUSCULAR | Status: DC | PRN
  Administered 2016-01-23: 5 mL

## 2016-01-23 MED ORDER — METOPROLOL SUCCINATE ER 25 MG PO TB24: 25.0000 mg | ORAL_TABLET | Freq: Every day | ORAL | Status: DC

## 2016-01-23 MED ORDER — ASPIRIN EC 81 MG PO TBEC
81.0000 mg | DELAYED_RELEASE_TABLET | Freq: Every day | ORAL | Status: DC
  Administered 2016-01-23: 81 mg via ORAL
  Filled 2016-01-23: qty 1

## 2016-01-23 MED ORDER — PHENOL 1.4 % MT LIQD
1.0000 | OROMUCOSAL | Status: DC | PRN
Start: 2016-01-23 — End: 2016-01-24

## 2016-01-23 MED ORDER — MORPHINE SULFATE (PF) 2 MG/ML IV SOLN
INTRAVENOUS | Status: AC
  Administered 2016-01-23: 2 mg via INTRAVASCULAR
  Filled 2016-01-23: qty 1

## 2016-01-23 MED ORDER — MEPERIDINE HCL 25 MG/ML IJ SOLN
6.2500 mg | INTRAMUSCULAR | Status: DC | PRN
  Administered 2016-01-23: 12.5 mg via INTRAVENOUS

## 2016-01-23 MED ORDER — MORPHINE SULFATE (PF) 2 MG/ML IV SOLN
1.0000 mg | INTRAVENOUS | Status: DC | PRN
  Administered 2016-01-23 (×4): 2 mg via INTRAVENOUS

## 2016-01-23 MED ORDER — ARTIFICIAL TEARS OP OINT
TOPICAL_OINTMENT | OPHTHALMIC | Status: DC | PRN
  Administered 2016-01-23: 1 via OPHTHALMIC

## 2016-01-23 MED ORDER — SODIUM CHLORIDE 0.9% FLUSH: 3.0000 mL | Freq: Two times a day (BID) | INTRAVENOUS | Status: DC

## 2016-01-23 MED ORDER — ACETAMINOPHEN 325 MG PO TABS: 650.0000 mg | ORAL_TABLET | ORAL | Status: DC | PRN

## 2016-01-23 SURGICAL SUPPLY — 87 items
ADH SKN CLS APL DERMABOND .7 (GAUZE/BANDAGES/DRESSINGS) ×1
APL SKNCLS STERI-STRIP NONHPOA (GAUZE/BANDAGES/DRESSINGS)
BENZOIN TINCTURE PRP APPL 2/3 (GAUZE/BANDAGES/DRESSINGS) IMPLANT
BIT DRILL NEURO 2X3.1 SFT TUCH (MISCELLANEOUS) IMPLANT
BIT DRILL VUEPOINT II (BIT) IMPLANT
BLADE CLIPPER SURG (BLADE) IMPLANT
BLADE SURG 11 STRL SS (BLADE) ×1 IMPLANT
BLADE ULTRA TIP 2M (BLADE) IMPLANT
BONE CANC CHIPS 20CC PCAN1/4 (Bone Implant) ×2 IMPLANT
BUR PRECISION FLUTE 5.0 (BURR) ×1 IMPLANT
CANISTER SUCT 3000ML PPV (MISCELLANEOUS) ×2 IMPLANT
CHIPS CANC BONE 20CC PCAN1/4 (Bone Implant) ×1 IMPLANT
DECANTER SPIKE VIAL GLASS SM (MISCELLANEOUS) ×2 IMPLANT
DERMABOND ADVANCED (GAUZE/BANDAGES/DRESSINGS) ×1
DERMABOND ADVANCED .7 DNX12 (GAUZE/BANDAGES/DRESSINGS) IMPLANT
DRAPE C-ARM 42X72 X-RAY (DRAPES) ×4 IMPLANT
DRAPE LAPAROTOMY 100X72 PEDS (DRAPES) ×2 IMPLANT
DRAPE MICROSCOPE LEICA (MISCELLANEOUS) IMPLANT
DRAPE POUCH INSTRU U-SHP 10X18 (DRAPES) ×2 IMPLANT
DRILL BIT VUEPOINT II (BIT) ×2
DRILL NEURO 2X3.1 SOFT TOUCH (MISCELLANEOUS) ×2
DRSG OPSITE POSTOP 4X6 (GAUZE/BANDAGES/DRESSINGS) ×1 IMPLANT
DRSG PAD ABDOMINAL 8X10 ST (GAUZE/BANDAGES/DRESSINGS) IMPLANT
DURAPREP 6ML APPLICATOR 50/CS (WOUND CARE) ×2 IMPLANT
ELECT BLADE 4.0 EZ CLEAN MEGAD (MISCELLANEOUS) ×2
ELECT REM PT RETURN 9FT ADLT (ELECTROSURGICAL) ×2
ELECTRODE BLDE 4.0 EZ CLN MEGD (MISCELLANEOUS) IMPLANT
ELECTRODE REM PT RTRN 9FT ADLT (ELECTROSURGICAL) ×1 IMPLANT
GAUZE SPONGE 4X4 12PLY STRL (GAUZE/BANDAGES/DRESSINGS) ×1 IMPLANT
GAUZE SPONGE 4X4 16PLY XRAY LF (GAUZE/BANDAGES/DRESSINGS) IMPLANT
GLOVE BIO SURGEON STRL SZ8 (GLOVE) ×3 IMPLANT
GLOVE BIOGEL PI IND STRL 7.0 (GLOVE) IMPLANT
GLOVE BIOGEL PI IND STRL 7.5 (GLOVE) IMPLANT
GLOVE BIOGEL PI IND STRL 8 (GLOVE) ×1 IMPLANT
GLOVE BIOGEL PI IND STRL 8.5 (GLOVE) ×1 IMPLANT
GLOVE BIOGEL PI INDICATOR 7.0 (GLOVE) ×1
GLOVE BIOGEL PI INDICATOR 7.5 (GLOVE) ×2
GLOVE BIOGEL PI INDICATOR 8 (GLOVE) ×1
GLOVE BIOGEL PI INDICATOR 8.5 (GLOVE) ×1
GLOVE ECLIPSE 8.0 STRL XLNG CF (GLOVE) ×2 IMPLANT
GLOVE EXAM NITRILE LRG STRL (GLOVE) IMPLANT
GLOVE EXAM NITRILE MD LF STRL (GLOVE) IMPLANT
GLOVE EXAM NITRILE XL STR (GLOVE) IMPLANT
GLOVE EXAM NITRILE XS STR PU (GLOVE) IMPLANT
GLOVE OPTIFIT SS 6.5 STRL BRWN (GLOVE) ×2 IMPLANT
GLOVE SS N UNI LF 7.0 STRL (GLOVE) ×2 IMPLANT
GOWN STRL REUS W/ TWL LRG LVL3 (GOWN DISPOSABLE) IMPLANT
GOWN STRL REUS W/ TWL XL LVL3 (GOWN DISPOSABLE) IMPLANT
GOWN STRL REUS W/TWL 2XL LVL3 (GOWN DISPOSABLE) ×2 IMPLANT
GOWN STRL REUS W/TWL LRG LVL3 (GOWN DISPOSABLE)
GOWN STRL REUS W/TWL XL LVL3 (GOWN DISPOSABLE) ×8
GRAFT BNE CANC CHIPS 1-8 20CC (Bone Implant) IMPLANT
HEMOSTAT SURGICEL 2X14 (HEMOSTASIS) IMPLANT
KIT BASIN OR (CUSTOM PROCEDURE TRAY) ×2 IMPLANT
KIT INFUSE X SMALL 1.4CC (Orthopedic Implant) ×1 IMPLANT
KIT ROOM TURNOVER OR (KITS) ×2 IMPLANT
MARKER SKIN DUAL TIP RULER LAB (MISCELLANEOUS) ×2 IMPLANT
NDL HYPO 18GX1.5 BLUNT FILL (NEEDLE) IMPLANT
NDL HYPO 25X1 1.5 SAFETY (NEEDLE) ×1 IMPLANT
NDL I PASS (NEEDLE) IMPLANT
NDL SPNL 22GX3.5 QUINCKE BK (NEEDLE) ×1 IMPLANT
NEEDLE HYPO 18GX1.5 BLUNT FILL (NEEDLE) IMPLANT
NEEDLE HYPO 25X1 1.5 SAFETY (NEEDLE) ×2 IMPLANT
NEEDLE I PASS (NEEDLE) ×2 IMPLANT
NEEDLE SPNL 22GX3.5 QUINCKE BK (NEEDLE) ×2 IMPLANT
NS IRRIG 1000ML POUR BTL (IV SOLUTION) ×2 IMPLANT
PACK LAMINECTOMY NEURO (CUSTOM PROCEDURE TRAY) ×2 IMPLANT
PIN MAYFIELD SKULL DISP (PIN) ×2 IMPLANT
ROD VUEPOINT 3.5X60 (Rod) ×1 IMPLANT
RUBBERBAND STERILE (MISCELLANEOUS) IMPLANT
SCREW MA MM 3.5X14 (Screw) ×4 IMPLANT
SCREW SET THREADED (Screw) ×4 IMPLANT
SPONGE INTESTINAL PEANUT (DISPOSABLE) IMPLANT
SPONGE SURGIFOAM ABS GEL SZ50 (HEMOSTASIS) ×2 IMPLANT
STAPLER SKIN PROX WIDE 3.9 (STAPLE) ×2 IMPLANT
STRIP CLOSURE SKIN 1/2X4 (GAUZE/BANDAGES/DRESSINGS) IMPLANT
SUT ETHILON 3 0 FSL (SUTURE) ×1 IMPLANT
SUT VIC AB 0 CT1 18XCR BRD8 (SUTURE) ×1 IMPLANT
SUT VIC AB 0 CT1 8-18 (SUTURE) ×2
SUT VIC AB 2-0 CP2 18 (SUTURE) ×2 IMPLANT
SUT VIC AB 3-0 SH 8-18 (SUTURE) ×1 IMPLANT
SYR 3ML LL SCALE MARK (SYRINGE) IMPLANT
TOWEL OR 17X24 6PK STRL BLUE (TOWEL DISPOSABLE) ×2 IMPLANT
TOWEL OR 17X26 10 PK STRL BLUE (TOWEL DISPOSABLE) ×2 IMPLANT
TRAY FOLEY W/METER SILVER 14FR (SET/KITS/TRAYS/PACK) IMPLANT
UNDERPAD 30X30 INCONTINENT (UNDERPADS AND DIAPERS) ×1 IMPLANT
WATER STERILE IRR 1000ML POUR (IV SOLUTION) ×2 IMPLANT

## 2016-01-23 NOTE — Progress Notes (Signed)
A line d/c with cath tip intact pressure applied x5 minutes and pressure bandage applied 

## 2016-01-23 NOTE — Progress Notes (Signed)
I did not see this when I put in the response to the phone note.  Looks like he's had preop evaluation by [name].  Does not need another appointment.

## 2016-01-23 NOTE — Op Note (Signed)
01/23/2016  12:23 PM  PATIENT:  [name]  65 y.o. male  PRE-OPERATIVE DIAGNOSIS:  Cervicalgia with cervical pseudoarthrosis and fractured screws C 67 level with radiculopathy  POST-OPERATIVE DIAGNOSIS:  Cervicalgia with cervical pseudoarthrosis and fractured screws C 67 level with radiculopathy  PROCEDURE:  Procedure(s) with comments: Cervical six-seven Posterior cervical fusion (N/A) - C6-7 Posterior cervical fusion with lateral mass screws C 6 through C 7 and posterolateral  fusion C 5 - C 7 levels with BMP, allograft  SURGEON:  Surgeon(s) and Role:    * Erline Levine, MD - Primary  PHYSICIAN ASSISTANT:   ASSISTANTS: [place], RN   ANESTHESIA:   general  EBL:  Total I/O In: 1000 [I.V.:1000] Out: 100 [Blood:100]  BLOOD ADMINISTERED:none  DRAINS: none   LOCAL MEDICATIONS USED:  LIDOCAINE   SPECIMEN:  No Specimen  DISPOSITION OF SPECIMEN:  N/A  COUNTS:  YES  TOURNIQUET:  * No tourniquets in log *  DICTATION: DICTATION: Patient is 65 year old man with prior anterior cervical fusion with pseudoarthrosis (broken screws and incomplete bony fusion), and neck pain with bilateral upper extremity radiculopathy.  It was elected to take the patient to surgery for posterior cervical fusion.   PROCEDURE: Patient was brought to the OR and following smooth and uncomplicated induction of GETA, patient was placed in 3 pin fixation and rolled into a prone position on the OR table in the cervical collar.  Posterior neck was  prepped and draped in the usual fashion with betadine scrub followed by Duraprep.  Area of planned incision was infiltrated with local lidocaine.  Incision was made from C 5-C 7 and carried through the avascular midline plane to expose these lavels and their lateral masses.  There was significant arthritis at each of these levels with facet arthropathy and bony excrescences.  Lateral mass screws were placed from C6 to C7 bilaterally according to standard landmarks and their  positioning was confirmed with fluoroscopy.  There was movement at this level with what appeared to be solid arthrodesis at the C 56 level. The facet joints and laminae were decorticated.  Screws and rods were locked down in situ.   Extra small BMP was placed C 5 through C 7 levels with 20 cc of moselized allograft.  Hemostasis was assured. Wound was closed with 0, 2-0, and 3-0 vicryl sutures were used to re approximate the skin edges. Sterile occlusive dressing with Dermabond and Opsite Visible was placed.  Patient was taken out of pins and turned back onto the OR table.  Patient was extubated in the operating room and taken to recovery in stable and satisfactory condition.  Counts were correct at the end of the case.  PLAN OF CARE: Admit to inpatient   PATIENT DISPOSITION:  PACU - hemodynamically stable.   Delay start of Pharmacological VTE agent (>24hrs) due to surgical blood loss or risk of bleeding: yes

## 2016-01-23 NOTE — Anesthesia Procedure Notes (Signed)
Procedure Name: Intubation Performed by: Judeth Cornfield T Pre-anesthesia Checklist: Patient identified, Timeout performed, Emergency Drugs available, Suction available and Patient being monitored Patient Re-evaluated:Patient Re-evaluated prior to inductionOxygen Delivery Method: Circle system utilized Preoxygenation: Pre-oxygenation with 100% oxygen Intubation Type: IV induction Laryngoscope Size: Mac and 4 Grade View: Grade II Tube type: Oral Number of attempts: 1 Airway Equipment and Method: Stylet Placement Confirmation: ETT inserted through vocal cords under direct vision,  breath sounds checked- equal and bilateral,  positive ETCO2 and CO2 detector Secured at: 22 cm Tube secured with: Tape Dental Injury: Teeth and Oropharynx as per pre-operative assessment

## 2016-01-23 NOTE — Telephone Encounter (Signed)
Please clarify: do you want the pt over booked into your schedule and how soon or do you want a PA-C to see soon?

## 2016-01-23 NOTE — Anesthesia Postprocedure Evaluation (Signed)
Anesthesia Post Note  Patient: [name]  Procedure(s) Performed: Procedure(s) (LRB): Cervical six-seven Posterior cervical fusion (N/A)  Patient location during evaluation: PACU Anesthesia Type: General Level of consciousness: awake and alert and oriented Pain management: satisfactory to patient Vital Signs Assessment: post-procedure vital signs reviewed and stable Respiratory status: spontaneous breathing, nonlabored ventilation, respiratory function stable and patient connected to nasal cannula oxygen Cardiovascular status: blood pressure returned to baseline and stable Postop Assessment: no signs of nausea or vomiting Anesthetic complications: no    Last Vitals:  Filed Vitals:   01/23/16 1315 01/23/16 1330  BP: 116/88 137/102  Pulse: 56 63  Temp:    Resp: 22 19    Last Pain:  Filed Vitals:   01/23/16 1343  PainSc: 6                  Shamiah Kahler A.

## 2016-01-23 NOTE — Transfer of Care (Signed)
Immediate Anesthesia Transfer of Care Note  Patient: [name]  Procedure(s) Performed: Procedure(s) with comments: Cervical six-seven Posterior cervical fusion (N/A) - C6-7 Posterior cervical fusion/possible decompression  Patient Location: PACU  Anesthesia Type:General  Level of Consciousness: awake, oriented, patient cooperative and responds to stimulation  Airway & Oxygen Therapy: Patient Spontanous Breathing and Patient connected to nasal cannula oxygen  Post-op Assessment: Report given to RN, Post -op Vital signs reviewed and stable and Patient moving all extremities X 4  Post vital signs: Reviewed and stable  Last Vitals:  Filed Vitals:   01/23/16 0758 01/23/16 1230  BP: 129/88 108/77  Pulse: 65 61  Temp: 36.8 C 35.8 C  Resp: 20 25    Complications: No apparent anesthesia complications

## 2016-01-23 NOTE — Anesthesia Preprocedure Evaluation (Addendum)
Anesthesia Evaluation  Patient identified by MRN, date of birth, ID band Patient awake    Reviewed: Allergy & Precautions, NPO status , Patient's Chart, lab work & pertinent test results, reviewed documented beta blocker date and time   Airway Mallampati: I  TM Distance: >3 FB Neck ROM: Full    Dental no notable dental hx. (+) Caps, Teeth Intact   Pulmonary sleep apnea and Continuous Positive Airway Pressure Ventilation , former smoker,    Pulmonary exam normal breath sounds clear to auscultation       Cardiovascular Exercise Tolerance: Good hypertension, Pt. on medications and Pt. 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 2003 4/6 grafts patent at [facility] 6/11 LAD proximal-mid 40%, mid to distal 30-40%, tiny D1 proximal 90%, small D2 ostial 70-80% LCx proximal 40%, mid AV groove 50%, distal AV groove 80%, RI occluded, OM1 proximally occluded RCA proximal occlusion LIMA-LAD atretic SVG-RI patent with 30% mid SVG-OM1/OM2 with first limb chronically occluded, 50% in proximal part of graft SVG-RCA/PDA patent  LVEF 67% mild inferior hypokinesis 02/2011   Neuro/Psych  Headaches, Anxiety Facial tic - takes neurontin  Neuromuscular disease    GI/Hepatic Neg liver ROS, GERD  Medicated and Controlled,Hx/o colon polyps Hx/o anal fissure   Endo/Other  Hyperlipidemia  Renal/GU negative Renal ROS  negative genitourinary   Musculoskeletal  (+) Arthritis , Osteoarthritis,  Cervicalgia   Abdominal Normal abdominal exam  (+)  Abdomen: soft. Bowel sounds: normal.  Peds  Hematology   Anesthesia Other Findings   Reproductive/Obstetrics                        Anesthesia Physical Anesthesia Plan  ASA: III  Anesthesia Plan: General   Post-op Pain  Management:    Induction: Intravenous  Airway Management Planned: Oral ETT  Additional Equipment: Arterial line  Intra-op Plan:   Post-operative Plan: Extubation in OR  Informed Consent: I have reviewed the patients History and Physical, chart, labs and discussed the procedure including the risks, benefits and alternatives for the proposed anesthesia with the patient or authorized representative who has indicated his/her understanding and acceptance.   Dental advisory given  Plan Discussed with: CRNA, Anesthesiologist and Surgeon  Anesthesia Plan Comments: (Will insert A-line for surgery for hemodynamic monitoring.)       Anesthesia Quick Evaluation

## 2016-01-23 NOTE — Progress Notes (Signed)
Please clarify: do you want the pt to be over booked into your schedule and how soon or do you want a PA-C to see soon?

## 2016-01-23 NOTE — Interval H&P Note (Signed)
History and Physical Interval Note:  01/23/2016 7:23 AM  [name]  has presented today for surgery, with the diagnosis of Cervicalgia  The various methods of treatment have been discussed with the patient and family. After consideration of risks, benefits and other options for treatment, the patient has consented to  Procedure(s) with comments: C6-7 Posterior cervical fusion/possible decompression (N/A) - C6-7 Posterior cervical fusion/possible decompression as a surgical intervention .  The patient's history has been reviewed, patient examined, no change in status, stable for surgery.  I have reviewed the patient's chart and labs.  Questions were answered to the patient's satisfaction.     Juliet Vasbinder D

## 2016-01-23 NOTE — Brief Op Note (Signed)
01/23/2016  12:23 PM  PATIENT:  [name]  65 y.o. male  PRE-OPERATIVE DIAGNOSIS:  Cervicalgia with cervical pseudoarthrosis and fractured screws C 67 level with radiculopathy  POST-OPERATIVE DIAGNOSIS:  Cervicalgia with cervical pseudoarthrosis and fractured screws C 67 level with radiculopathy  PROCEDURE:  Procedure(s) with comments: Cervical six-seven Posterior cervical fusion (N/A) - C6-7 Posterior cervical fusion with lateral mass screws C 6 through C 7 and posterolateral  fusion C 5 - C 7 levels with BMP, allograft  SURGEON:  Surgeon(s) and Role:    * Erline Levine, MD - Primary  PHYSICIAN ASSISTANT:   ASSISTANTS: Poteat, RN   ANESTHESIA:   general  EBL:  Total I/O In: 1000 [I.V.:1000] Out: 100 [Blood:100]  BLOOD ADMINISTERED:none  DRAINS: none   LOCAL MEDICATIONS USED:  LIDOCAINE   SPECIMEN:  No Specimen  DISPOSITION OF SPECIMEN:  N/A  COUNTS:  YES  TOURNIQUET:  * No tourniquets in log *  DICTATION: DICTATION: Patient is 65 year old man with prior anterior cervical fusion with pseudoarthrosis (broken screws and incomplete bony fusion), and neck pain with bilateral upper extremity radiculopathy.  It was elected to take the patient to surgery for posterior cervical fusion.   PROCEDURE: Patient was brought to the OR and following smooth and uncomplicated induction of GETA, patient was placed in 3 pin fixation and rolled into a prone position on the OR table in the cervical collar.  Posterior neck was  prepped and draped in the usual fashion with betadine scrub followed by Duraprep.  Area of planned incision was infiltrated with local lidocaine.  Incision was made from C 5-C 7 and carried through the avascular midline plane to expose these lavels and their lateral masses.  There was significant arthritis at each of these levels with facet arthropathy and bony excrescences.  Lateral mass screws were placed from C6 to C7 bilaterally according to standard landmarks and their  positioning was confirmed with fluoroscopy.  There was movement at this level with what appeared to be solid arthrodesis at the C 56 level. The facet joints and laminae were decorticated.  Screws and rods were locked down in situ.   Extra small BMP was placed C 5 through C 7 levels with 20 cc of moselized allograft.  Hemostasis was assured. Wound was closed with 0, 2-0, and 3-0 vicryl sutures were used to re approximate the skin edges. Sterile occlusive dressing with Dermabond and Opsite Visible was placed.  Patient was taken out of pins and turned back onto the OR table.  Patient was extubated in the operating room and taken to recovery in stable and satisfactory condition.  Counts were correct at the end of the case.  PLAN OF CARE: Admit to inpatient   PATIENT DISPOSITION:  PACU - hemodynamically stable.   Delay start of Pharmacological VTE agent (>24hrs) due to surgical blood loss or risk of bleeding: yes

## 2016-01-23 NOTE — Progress Notes (Signed)
Awake, alert, conversant.  MAEW with good strength. 

## 2016-01-23 NOTE — Progress Notes (Signed)
Pt shibering on arrival iv med given

## 2016-01-23 NOTE — Telephone Encounter (Signed)
Dijion, [place] 01/21/16 >','<< Less Detail',event)" href="javascript:;"><< Less Detail   Larey Dresser, MD   Sent: Thu January 23, 2016 1:09 PM    To: Katrine Coho, RN; P Cv Div Ch St Triage        Message     Will need followup for pre-op cardiac evaluation soon.         ----- Message -----     From: Jacinta Shoe, PA-C     Sent: 01/21/2016 11:29 AM      To: Larey Dresser, MD        Dr. Aundra Dubin,     FYI: Dr. Melven Sartorius office will be contacting you regarding cardiac clearance on [name] scheduled for C6-7 posterior fusion scheduled for 01/23/16. Unfortunately, his pre-op visit was just this morning. You last saw him in 03/2014. He is not having active symptoms, but has known CAD/CABG with occluded PDA graft, atretic LIMA-LAD. I advised him typically when we request clearance, it is common for the physician to want office evaluation if last visit was > 1 year. I'm routing you a copy of my note.         [name]    [facility] Short Stay Center/Anesthesiology    Phone [phone]    01/21/2016 11:28 AM                    Patient Information     Patient Name Sex DOB SSN    [name], [name] Male Aug 09, 1951 [id number]        Progress Notes by Jacinta Shoe, PA-C at 01/21/2016 9:36 AM     Author: Jacinta Shoe, PA-C Service: Anesthesiology Author Type: Physician Assistant    Filed: 01/21/2016 11:21 AM Note Time: 01/21/2016 9:36 AM Status: Signed    Editor: Jacinta Shoe, PA-C (Physician Assistant)      Related Notes: Addendum by Jacinta Shoe, PA-C (Physician Assistant) filed at 01/22/2016 11:57 AM    Expand All Collapse All   Anesthesia PAT evaluation: Patient is a 65 year old male scheduled for C6-7 posterior cervical fusion, possible decompression on 01/23/2016 by Dr. Vertell Limber.  History includes CAD s/p CABG (LIMA to LAD, SVG to Ramus INT, SVG to OM1 and OM2, SVG to RCA and PDA by Dr. Prescott Gum) 09/17/01, AAA, remote history of syncope, former smoker, HLD, HTN, insomnia, headaches, OSA with CPAP use (Dr. Brett Fairy), GERD s/p Nissen fundoplication 02, facial tic, cervical fracture (s/p C2-7 fusion; five c-spine surgeries), L3-5 anterolateral fusion '13.  PCP is Dr. Etter Sjogren, last visit 04/18/15. Cardiologist is Dr. Aundra Dubin, last visit 04/01/14. Six month follow-up recommended.   Meds include Uroxatral, aspirin 81 mg, Lipitor, Klonopin, Pepcid, fenofibrate, Neurontin, Krill oil, Toprol-XL, Remeron, nitroglycerin, ramipril, Zanaflex, tramadol, turmeric, Valtrex PRN.  PAT vitals: BP 101/67, HR 57, RR 20, T 36.5 Celsius, O2 sat 99%. Exam shows a pleasant Caucasian male in NAD. BMI 23.80. Heart RRR, no murmur noted. No carotid bruits heard. Lungs clear. No ankle edema. He denied chest pain since 2012. No Nitro use. No new fatigue or SOB. No edema. He says he stays active. His wife has myotonic dystrophy, and her activity is limited. He does all of the yardwork (mowing and trimming takes about 3 hours) and a lot of the house chores. He is able to due mowing (riding lawnmower), trimming, vacuuming, laundry, dishes without CV symptoms.  01/21/16/ EKG: SB at 50 bpm, first degree AVB, abnormal QRS-T angle, consider primary  T wave abnormality (most pronounced in inferior leads, non-specific in lateral leads). Lateral T wave abnormality is less pronounced when compared to 04-26-2014 tracing.  Dr. Claris Gladden 03/2014 office note mentions a cardiac cath in 2012, which is also when patient thinks he had his last cardiac cath. I cannot locate a 2012 report, so request sent to [facility]. Per notes: LHC (5/12) with EF 50%; LIMA-LAD atretic, SVG-OM patent, SVG-ramus patent, seq SVG-RCA and PDA with RCA arm patent and PDA arm occluded.   Nuclear stress test on 02/16/11 (done for complaints of angina) showed: Overall Impression Comments: Inferior defect consistent with soft tissue attenuation and very mild ischemia. Does not  appear to be significant quantitatively. LVEF 67%. Following his stress test his chest pain improved, so Dr. Lia Foyer ultimately decided to treat him medically.   Cardiac cath on 06/06/10 showed: 1. Severe three-vessel coronary artery disease which is unchanged.  2. 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  3. The saphenous vein sequential graft to the first obtuse marginal artery and second obtuse marginal artery has some mild plaquing in it. The first limb is chronically occluded. There is a 50% lesion in the proximal part of the graft to moderate diffuse disease throughout the middle of the graft.  4. Saphenous vein Y-graft to the right coronary artery and posterior descending artery is patent. The posterior descending artery is tiny and has dye hang-up in the graft which is chronic. Saphenous vein graft to the ramus is patent.  5. Left ventricular functions with an ejection fraction of 45-50%.  Continued medical therapy was recommended.  05/10/08 Echo:SUMMARY - Overall left ventricular systolic function was normal. Left    ventricular ejection fraction was estimated , range being 60    % to 65 %. There was focal basal septal hypertrophy that    measures 24 mm then quickly tapers. Trivial MR/PR.  03/01/15 AAA U/S: Stable dimensions of infrarenal fusiform AAA at 3.6 cm x 3.7 cm. Normal caliber common and external iliac arteries bilaterally. Follow-up one year recommended.  Preoperative labs noted.   Above discussed with anesthesiologist Dr. Marcie Bal. His EKG appears stable. His reported activity seems to be at least 4 METS. However, with known CAD with occluded vein grafts, no functional study since 2012 and nearly two years since he saw cardiology he will need cardiac clearance. I told Mr. Strohmaier that since his last visit was in 2015 that I thought Dr. Aundra Dubin may want him re-evaluated in the office and if that could not be arranged then it was possible his surgery  may be delayed. We would defer decision for additional testing to cardiology. He is understandable upset because he has been dealing with left shoulder pain (present since shoulder surgery 4 years ago, but now worsening) for 18 months now. I have notified Jessica at Dr. Melven Sartorius office. Patient also contacted Dr. Claris Gladden office.   [name] [facility] Short Stay Center/Anesthesiology Phone [phone] 01/21/2016 11:18 AM

## 2016-01-24 ENCOUNTER — Encounter (HOSPITAL_COMMUNITY): Payer: Self-pay | Admitting: Neurosurgery

## 2016-01-24 MED ORDER — TIZANIDINE HCL 4 MG PO TABS: 4.0000 mg | ORAL_TABLET | Freq: Four times a day (QID) | ORAL | Status: DC | PRN

## 2016-01-24 MED ORDER — OXYCODONE-ACETAMINOPHEN 10-325 MG PO TABS: 1.0000 | ORAL_TABLET | ORAL | Status: DC | PRN

## 2016-01-24 NOTE — Discharge Instructions (Signed)

## 2016-01-24 NOTE — Progress Notes (Signed)
Pt doing well. Pt and wife given D/C instructions with Rx, verbal understanding was provided. Pt's IV was removed prior to D/C. Pt's incision is clean and dry with no sign of infection. Pt D/C'd home via wheelchair @ 1000 per MD order. Pt is stable @ D/C and has no other needs at this time. Holli Humbles, RN

## 2016-01-24 NOTE — Evaluation (Signed)
Occupational Therapy Evaluation Patient Details Name: [name] MRN: [id number] DOB: 23-Nov-1951 Today's Date: 01/24/2016    History of Present Illness Pt is a 65 y.o. male s/p Cervical six-seven Posterior cervical fusion.   Clinical Impression   Pt reports he was independent with ADLs and mobility PTA; was the primary caregiver for his wife. Pt currently overall supervision for safety with ADLs and functional mobility. All cervical, ADL, and safety education complete; pt with no further questions or concerns. Pt planning to d/c home with 24/7 supervision from his wife. Pt ready to d/c from OT standpoint; signing off at this time.     Follow Up Recommendations  No OT follow up;Supervision - Intermittent    Equipment Recommendations  None recommended by OT    Recommendations for Other Services       Precautions / Restrictions Precautions Precautions: Cervical;Fall Precaution Comments: Educated pt on cervical precautions and provided handout Required Braces or Orthoses: Cervical Brace Cervical Brace: Hard collar;At all times Restrictions Weight Bearing Restrictions: No      Mobility Bed Mobility Overal bed mobility: Modified Independent             General bed mobility comments: Observed with HOB raised. Pt able to verbalize technique for if bed was flat.  Transfers Overall transfer level: Needs assistance Equipment used: None Transfers: Sit to/from Stand Sit to Stand: Supervision         General transfer comment: Supervision for safety; no physical assist required. Good technique.    Balance Overall balance assessment: No apparent balance deficits (not formally assessed)                                          ADL Overall ADL's : Needs assistance/impaired Eating/Feeding: Set up;Sitting   Grooming: Supervision/safety;Standing Grooming Details (indicate cue type and reason): Educated on use of cup for oral care. Upper Body Bathing:  Set up;Sitting   Lower Body Bathing: Supervison/ safety;Sit to/from stand   Upper Body Dressing : Supervision/safety;Standing Upper Body Dressing Details (indicate cue type and reason): Pt able to don shirt in standing. Lower Body Dressing: Supervision/safety;Sit to/from stand Lower Body Dressing Details (indicate cue type and reason): Pt able to don underwear and pants. Educated on sitting to perform LB ADLs. Toilet Transfer: Supervision/safety;Ambulation;Comfort height toilet Toilet Transfer Details (indicate cue type and reason): Simulated by sit to stand from [place] and Hygiene: Supervision/safety;Sit to/from stand       Functional mobility during ADLs: Supervision/safety General ADL Comments: Pts wife present for OT eval. Educated on home safety, cervical precautions in relation to ADLs/IADLs. Pt reports he wants to get back to caregiving for his wife; discussed not overdoing it and giving neck time to heal; pt verbalized understanding. Pt able to perform functional mobility down hallway with supervision for safety and no use of AD.     Vision Vision Assessment?: No apparent visual deficits   Perception     Praxis      Pertinent Vitals/Pain Pain Assessment: 0-10 Pain Score: 7  Pain Location: neck Pain Descriptors / Indicators: Aching;Sore Pain Intervention(s): Limited activity within patient's tolerance;Monitored during session;Premedicated before session     Hand Dominance     Extremity/Trunk Assessment Upper Extremity Assessment Upper Extremity Assessment: Overall WFL for tasks assessed   Lower Extremity Assessment Lower Extremity Assessment: Overall WFL for tasks assessed   Cervical / Trunk  Assessment Cervical / Trunk Assessment: Other exceptions Cervical / Trunk Exceptions: pt s/p cervical sx   Communication Communication Communication: No difficulties   Cognition Arousal/Alertness: Awake/alert Behavior During Therapy: WFL for  tasks assessed/performed Overall Cognitive Status: Within Functional Limits for tasks assessed                     General Comments       Exercises       Shoulder Instructions      Home Living Family/patient expects to be discharged to:: Private residence Living Arrangements: Spouse/significant other Available Help at Discharge: Family;Available 24 hours/day Type of Home: House Home Access: Stairs to enter [employer] of Steps: 2 Entrance Stairs-Rails: Right Home Layout: One level     Bathroom Shower/Tub: Occupational psychologist: Handicapped height Bathroom Accessibility: Yes How Accessible: Accessible via walker Home Equipment: [place] - 2 wheels;Walker - 4 wheels;Bedside commode;Shower seat;Shower seat - built in          Prior Functioning/Environment Level of Independence: Independent             OT Diagnosis: Acute pain   OT Problem List:     OT Treatment/Interventions:      OT Goals(Current goals can be found in the care plan section) Acute Rehab OT Goals Patient Stated Goal: get breakfast then go home OT Goal Formulation: All assessment and education complete, DC therapy  OT Frequency:     Barriers to D/C:            Co-evaluation              End of Session Equipment Utilized During Treatment: Cervical collar  Activity Tolerance: Patient tolerated treatment well Patient left: in bed;with call bell/phone within reach;with family/visitor present   Time: [id number] OT Time Calculation (min): 19 min Charges:  OT General Charges $OT Visit: 1 Procedure OT Evaluation $OT Eval Low Complexity: 1 Procedure G-Codes:     Binnie Kand M.S., OTR/L Pager: [phone]  01/24/2016, 8:55 AM

## 2016-01-24 NOTE — Progress Notes (Signed)
PT Cancellation Note  Patient Details Name: [name] MRN: [id number] DOB: [birth year]   Cancelled Treatment:    Reason Eval/Treat Not Completed: PT screened, no needs identified, will sign off. Discussed pt case with OT who states that pt has ambulated and demonstrated the stairs during OT eval. It appears that pt is at a supervision level overall and is appropriate for d/c from a mobility standpoint. If needs change, please reconsult.    Rolinda Roan 01/24/2016, 10:14 AM   Rolinda Roan, PT, DPT Acute Rehabilitation Services Pager: [phone]

## 2016-01-24 NOTE — Discharge Summary (Signed)
Physician Discharge Summary  Patient ID: [name] MRN: [id number] DOB/AGE: [birth year] 65 y.o.  Admit date: 01/23/2016 Discharge date: 01/24/2016  Admission Diagnoses: Cervicalgia with cervical pseudoarthrosis and fractured screws C 67 level with radiculopathy   Discharge Diagnoses: Cervicalgia with cervical pseudoarthrosis and fractured screws C 67 level with radiculopathy s/p Cervical six-seven Posterior cervical fusion (N/A) - C6-7 Posterior cervical fusion with lateral mass screws C 6 through C 7 and posterolateral  fusion C 5 - C 7 levels with BMP, allograft  Active Problems:   Pseudoarthrosis of cervical spine [facility])   Discharged Condition: good  Hospital Course: [name] was admitted for surgery with dx pseudoarthrosis and fractured screws C6-7 level. Following uncomplicated posterior cervical fusion, he recovered nicely & transferred to [facility] for nursing care. He is doing well.   Consults: None  Significant Diagnostic Studies: radiology: X-Ray: intra-op  Treatments: surgery: Cervical six-seven Posterior cervical fusion (N/A) - C6-7 Posterior cervical fusion with lateral mass screws C 6 through C 7 and posterolateral  fusion C 5 - C 7 levels with BMP, allograft   Discharge Exam: Blood pressure 145/82, pulse 75, temperature 98.6 F (37 C), temperature source Oral, resp. rate 18, height 6' 3.5" (1.918 m), weight 88.451 kg (195 lb), SpO2 97 %. Alert, conversant. Wife at bedside. Pleased with abscence of left trap/shoulder pain. Good strength BUE. Incision without erythema, swelling, or drainage, beneath honeycomb drsg & Dermabond.    Disposition: 01-Home or Self Care  Rx's Tizanidine & Percocet to chart for home use. Pt verbalizes understanding of d/c instructions and agrees to call office to schedule appt.       Medication List    TAKE these medications        alfuzosin 10 MG 24 hr tablet  Commonly known as:  UROXATRAL  Take 1 tablet by mouth daily.     aspirin 81 MG  EC tablet  Take 81 mg by mouth at bedtime.     atorvastatin 20 MG tablet  Commonly known as:  LIPITOR  TAKE 1 TABLET (20 MG TOTAL) BY MOUTH DAILY.     clonazePAM 0.5 MG tablet  Commonly known as:  KLONOPIN  Take 1-4 tablets (0.5-2 mg total) by mouth every evening. At bedtime as directed     Co Q 10 100 MG Caps  Take 100 mg by mouth daily.     famotidine 20 MG tablet  Commonly known as:  PEPCID  Take 20 mg by mouth daily.     fenofibrate 160 MG tablet  Take 1 tablet (160 mg total) by mouth at bedtime.     gabapentin 100 MG capsule  Commonly known as:  NEURONTIN  Take 100-300 mg by mouth 3 (three) times daily. Take 1 capsule every morning & at noon Take 3 capsules at bedtime     KRILL OIL PO  Take 500 mg by mouth daily.     metoprolol succinate 25 MG 24 hr tablet  Commonly known as:  TOPROL-XL  Take 1 tablet (25 mg total) by mouth every morning.     mirtazapine 45 MG tablet  Commonly known as:  REMERON  Take 1 tablet (45 mg total) by mouth at bedtime.     mupirocin ointment 2 %  Commonly known as:  BACTROBAN  Apply 1 application topically 2 (two) times daily.     nitroGLYCERIN 0.4 MG SL tablet  Commonly known as:  NITROSTAT  Place 1 tablet (0.4 mg total) under the tongue every 5 (five) minutes  as needed. for chest pain     oxyCODONE-acetaminophen 10-325 MG tablet  Commonly known as:  PERCOCET  Take 1 tablet by mouth every 4 (four) hours as needed for pain.     ramipril 5 MG capsule  Commonly known as:  ALTACE  Take 1 capsule (5 mg total) by mouth 2 (two) times daily.     tiZANidine 4 MG tablet  Commonly known as:  ZANAFLEX  PATIENT TAKES 1 1/2 TABLET BY MOUTH TWO TIMES DAILY AS NEEDED FOR MUSCLE SPASMS     tiZANidine 4 MG tablet  Commonly known as:  ZANAFLEX  Take 1 tablet (4 mg total) by mouth every 6 (six) hours as needed for muscle spasms.     traMADol 50 MG tablet  Commonly known as:  ULTRAM  Take 50 mg by mouth every 8 (eight) hours as needed for  severe pain.     triamcinolone cream 0.1 %  Commonly known as:  KENALOG  Apply 1 application topically daily as needed (AS NEEDED FOR CONTACT DERMATIIS).     Turmeric 500 MG Caps  Take 500 mg by mouth daily.     valACYclovir 500 MG tablet  Commonly known as:  VALTREX  Take 1 tablet (500 mg total) by mouth 2 (two) times daily as needed. For flare up  Of cold sore         Signed: Verdis Prime 01/24/2016, 7:59 AM

## 2016-01-24 NOTE — Progress Notes (Signed)
Subjective: Patient reports "I don't have to brace anymore to control the pain"  Objective: Vital signs in last 24 hours: Temp:  [96.4 F (35.8 C)-98.7 F (37.1 C)] 98.6 F (37 C) (02/10 0747) Pulse Rate:  [56-103] 75 (02/10 0400) Resp:  [11-65] 18 (02/10 0747) BP: (108-154)/(70-102) 145/82 mmHg (02/10 0747) SpO2:  [94 %-100 %] 97 % (02/10 0747) Arterial Line BP: (150-167)/(70-81) 167/81 mmHg (02/09 1315) Weight:  [88.451 kg (195 lb)] 88.451 kg (195 lb) (02/09 0758)  Intake/Output from previous day: 02/09 0701 - 02/10 0700 In: 1300 [P.O.:100; I.V.:1200] Out: 100 [Blood:100] Intake/Output this shift:    Alert, conversant. Wife at bedside. Pleased with abscence of left trap/shoulder pain. Good strength BUE. Incision without erythema, swelling, or drainage, beneath honeycomb drsg & Dermabond.   Lab Results:  Recent Labs  01/21/16 0920  WBC 4.4  HGB 12.3*  HCT 35.4*  PLT 170   BMET  Recent Labs  01/21/16 0920  NA 141  K 3.9  CL 103  CO2 27  GLUCOSE 92  BUN 15  CREATININE 1.27*  CALCIUM 9.6    Studies/Results: Dg Cervical Spine 2-3 Views  01/23/2016  CLINICAL DATA:  C6-7 posterior cervical fusion for cervicalgia. EXAM: DG C-ARM 61-120 MIN; CERVICAL SPINE - 2-3 VIEW COMPARISON:  10/28/2015 FINDINGS: 2 fluoroscopic spot images of the cervical spine demonstrate the 3 level anterior plate and screw fixator (believed to be at the C3-C4-C5 level) and a 2 level anterior plate and screw fixator (believed to be at C6-7). One of the 2 lower screws in the C7 vertebral body from the anterior plate fixator might be discontinuous. There are tissue spreaders in place at C6-7 and facet screws posteriorly at the C6-7 level faintly seen. IMPRESSION: 1. Intraoperative images showing the posterior screw placement at C6-7. Stable appearance of the anterior plate and screw fixators. Electronically Signed   By: Van Clines M.D.   On: 01/23/2016 12:59   Dg C-arm 1-60 Min  01/23/2016   CLINICAL DATA:  C6-7 posterior cervical fusion for cervicalgia. EXAM: DG C-ARM 61-120 MIN; CERVICAL SPINE - 2-3 VIEW COMPARISON:  10/28/2015 FINDINGS: 2 fluoroscopic spot images of the cervical spine demonstrate the 3 level anterior plate and screw fixator (believed to be at the C3-C4-C5 level) and a 2 level anterior plate and screw fixator (believed to be at C6-7). One of the 2 lower screws in the C7 vertebral body from the anterior plate fixator might be discontinuous. There are tissue spreaders in place at C6-7 and facet screws posteriorly at the C6-7 level faintly seen. IMPRESSION: 1. Intraoperative images showing the posterior screw placement at C6-7. Stable appearance of the anterior plate and screw fixators. Electronically Signed   By: Van Clines M.D.   On: 01/23/2016 12:59    Assessment/Plan: Improved    LOS: 1 day  Per DrStern, d/c IV, d/c to home. Rx's Tizanidine & Percocet to chart for home use. Pt verbalizes understanding of d/c instructions and agrees to call office to schedule appt.    Verdis Prime 01/24/2016, 7:48 AM

## 2016-02-11 ENCOUNTER — Telehealth: Payer: Self-pay | Admitting: Family Medicine

## 2016-02-11 NOTE — Telephone Encounter (Signed)
Patient has been made aware to call the insurance company and see if they cover the prevnar, he stated he would check and call back and let us know.     KP 

## 2016-02-11 NOTE — Telephone Encounter (Signed)
Probably pneumococcal ---- if they are requesting prevnar they should check with there ins

## 2016-02-11 NOTE — Telephone Encounter (Signed)
Which pneumonia?    KP

## 2016-02-11 NOTE — Telephone Encounter (Signed)
Pt scheduled for flu shot and wants pneumonia shot. Scheduled for 02/13/16. Please enter orders as needed. ° °

## 2016-02-13 ENCOUNTER — Ambulatory Visit: Payer: Federal, State, Local not specified - PPO

## 2016-02-18 ENCOUNTER — Encounter: Payer: Self-pay | Admitting: Family Medicine

## 2016-03-01 ENCOUNTER — Other Ambulatory Visit: Payer: Self-pay | Admitting: Family Medicine

## 2016-03-02 NOTE — Telephone Encounter (Signed)
Medication filled to pharmacy as requested.   

## 2016-04-03 ENCOUNTER — Encounter: Payer: Self-pay | Admitting: Family Medicine

## 2016-04-18 ENCOUNTER — Other Ambulatory Visit: Payer: Self-pay | Admitting: Family Medicine

## 2016-04-23 ENCOUNTER — Other Ambulatory Visit: Payer: Self-pay

## 2016-04-24 MED ORDER — GABAPENTIN 100 MG PO CAPS: 100.0000 mg | ORAL_CAPSULE | Freq: Three times a day (TID) | ORAL | Status: DC

## 2016-04-27 ENCOUNTER — Telehealth: Payer: Self-pay | Admitting: Neurology

## 2016-04-27 ENCOUNTER — Other Ambulatory Visit: Payer: Self-pay | Admitting: Cardiology

## 2016-04-27 DIAGNOSIS — I714 Abdominal aortic aneurysm, without rupture, unspecified: Secondary | ICD-10-CM

## 2016-04-27 MED ORDER — GABAPENTIN 100 MG PO CAPS: ORAL_CAPSULE | ORAL | Status: DC

## 2016-04-27 NOTE — Telephone Encounter (Signed)
Your understanding is correct, 1 - AM and 1 - Lunch , and 3 at night

## 2016-04-27 NOTE — Telephone Encounter (Signed)
I spoke with Beth at [facility] and clarified that Dr. Brett Fairy wants the pt to take gabapentin 100mg  capsule by mouth 1 capsule in the morning, 1 capsule at lunch, and 3 capsules at bedtime. The current sig reads "Take 1-3 capsules (100-300 mg total) by mouth 3 (three) times daily. Take 1 capsule every morning & at noon Take 3 capsules at bedtime". Beth reports that the current sig is confusing.  If this is incorrect, please let me know.

## 2016-04-27 NOTE — Telephone Encounter (Signed)
B[facility] needs to clarify medication directions.  [phone]

## 2016-04-27 NOTE — Telephone Encounter (Signed)
RX for gabapentin faxed to [facility]. Received a receipt of confirmation.

## 2016-04-27 NOTE — Addendum Note (Signed)
Addended by: Larey Seat on: 04/27/2016 11:21 AM   Modules accepted: Orders

## 2016-04-28 ENCOUNTER — Encounter: Payer: Self-pay | Admitting: Internal Medicine

## 2016-04-28 ENCOUNTER — Other Ambulatory Visit: Payer: Self-pay

## 2016-04-28 MED ORDER — GABAPENTIN 100 MG PO CAPS: ORAL_CAPSULE | ORAL | Status: DC

## 2016-04-28 NOTE — Telephone Encounter (Signed)
Pt requesting 90 day supply. Fax from [facility].

## 2016-04-28 NOTE — Telephone Encounter (Signed)
RX for gabapentin faxed to [facility]. Received a receipt of confirmation.

## 2016-05-07 ENCOUNTER — Ambulatory Visit (HOSPITAL_COMMUNITY)
Admission: RE | Admit: 2016-05-07 | Discharge: 2016-05-07 | Disposition: A | Discharge status: Home / Self Care | Payer: Federal, State, Local not specified - PPO | Source: Ambulatory Visit | Attending: Physician Assistant | Admitting: Physician Assistant

## 2016-05-07 DIAGNOSIS — I10 Essential (primary) hypertension: Secondary | ICD-10-CM | POA: Insufficient documentation

## 2016-05-07 DIAGNOSIS — I714 Abdominal aortic aneurysm, without rupture, unspecified: Secondary | ICD-10-CM

## 2016-05-07 DIAGNOSIS — G4733 Obstructive sleep apnea (adult) (pediatric): Secondary | ICD-10-CM | POA: Diagnosis not present

## 2016-05-07 DIAGNOSIS — I25810 Atherosclerosis of coronary artery bypass graft(s) without angina pectoris: Secondary | ICD-10-CM | POA: Diagnosis not present

## 2016-05-07 DIAGNOSIS — I708 Atherosclerosis of other arteries: Secondary | ICD-10-CM | POA: Diagnosis not present

## 2016-05-07 DIAGNOSIS — I2510 Atherosclerotic heart disease of native coronary artery without angina pectoris: Secondary | ICD-10-CM | POA: Insufficient documentation

## 2016-05-07 DIAGNOSIS — I700 Atherosclerosis of aorta: Secondary | ICD-10-CM | POA: Insufficient documentation

## 2016-05-07 DIAGNOSIS — E785 Hyperlipidemia, unspecified: Secondary | ICD-10-CM | POA: Diagnosis not present

## 2016-05-08 ENCOUNTER — Ambulatory Visit (INDEPENDENT_AMBULATORY_CARE_PROVIDER_SITE_OTHER): Payer: Federal, State, Local not specified - PPO | Admitting: Family Medicine

## 2016-05-08 ENCOUNTER — Telehealth: Payer: Self-pay | Admitting: Cardiology

## 2016-05-08 ENCOUNTER — Encounter: Payer: Self-pay | Admitting: Family Medicine

## 2016-05-08 VITALS — BP 118/80 | HR 61 | Temp 97.8°F | Ht 76.0 in | Wt 190.2 lb

## 2016-05-08 DIAGNOSIS — I10 Essential (primary) hypertension: Secondary | ICD-10-CM

## 2016-05-08 DIAGNOSIS — Z1159 Encounter for screening for other viral diseases: Secondary | ICD-10-CM | POA: Diagnosis not present

## 2016-05-08 DIAGNOSIS — N138 Other obstructive and reflux uropathy: Secondary | ICD-10-CM

## 2016-05-08 DIAGNOSIS — E785 Hyperlipidemia, unspecified: Secondary | ICD-10-CM | POA: Diagnosis not present

## 2016-05-08 DIAGNOSIS — Z635 Disruption of family by separation and divorce: Secondary | ICD-10-CM

## 2016-05-08 DIAGNOSIS — N401 Enlarged prostate with lower urinary tract symptoms: Secondary | ICD-10-CM | POA: Diagnosis not present

## 2016-05-08 LAB — CBC WITH DIFFERENTIAL/PLATELET
Basophils Absolute: 0 10*3/uL (ref 0.0–0.1)
Basophils Relative: 0.7 % (ref 0.0–3.0)
Eosinophils Absolute: 0.2 10*3/uL (ref 0.0–0.7)
Eosinophils Relative: 3.8 % (ref 0.0–5.0)
HCT: 36.3 % — ABNORMAL LOW (ref 39.0–52.0)
Hemoglobin: 12.1 g/dL — ABNORMAL LOW (ref 13.0–17.0)
Lymphocytes Relative: 33.8 % (ref 12.0–46.0)
Lymphs Abs: 1.4 10*3/uL (ref 0.7–4.0)
MCHC: 33.3 g/dL (ref 30.0–36.0)
MCV: 91.1 fl (ref 78.0–100.0)
Monocytes Absolute: 0.4 10*3/uL (ref 0.1–1.0)
Monocytes Relative: 8.6 % (ref 3.0–12.0)
Neutro Abs: 2.2 10*3/uL (ref 1.4–7.7)
Neutrophils Relative %: 53.1 % (ref 43.0–77.0)
Platelets: 190 10*3/uL (ref 150.0–400.0)
RBC: 3.98 Mil/uL — ABNORMAL LOW (ref 4.22–5.81)
RDW: 13.8 % (ref 11.5–15.5)
WBC: 4.2 10*3/uL (ref 4.0–10.5)

## 2016-05-08 LAB — POCT URINALYSIS DIPSTICK
Bilirubin, UA: NEGATIVE
Blood, UA: NEGATIVE
Glucose, UA: NEGATIVE
Ketones, UA: NEGATIVE
Leukocytes, UA: NEGATIVE
Nitrite, UA: NEGATIVE
Protein, UA: NEGATIVE
Spec Grav, UA: >=1.03
Urobilinogen, UA: 0.2
pH, UA: 6

## 2016-05-08 LAB — COMPREHENSIVE METABOLIC PANEL
ALT: 14 U/L (ref 0–53)
AST: 15 U/L (ref 0–37)
Albumin: 4.7 g/dL (ref 3.5–5.2)
Alkaline Phosphatase: 37 U/L — ABNORMAL LOW (ref 39–117)
BUN: 21 mg/dL (ref 6–23)
CO2: 31 mEq/L (ref 19–32)
Calcium: 9.9 mg/dL (ref 8.4–10.5)
Chloride: 108 mEq/L (ref 96–112)
Creatinine, Ser: 1.14 mg/dL (ref 0.40–1.50)
GFR: 68.68 mL/min (ref >60.00)
Glucose, Bld: 108 mg/dL — ABNORMAL HIGH (ref 70–99)
Potassium: 4.5 mEq/L (ref 3.5–5.1)
Sodium: 143 mEq/L (ref 135–145)
Total Bilirubin: 0.5 mg/dL (ref 0.2–1.2)
Total Protein: 7 g/dL (ref 6.0–8.3)

## 2016-05-08 LAB — PSA: PSA: 1.56 ng/mL (ref 0.10–4.00)

## 2016-05-08 LAB — LIPID PANEL
Cholesterol: 144 mg/dL (ref 0–200)
HDL: 37.4 mg/dL — ABNORMAL LOW (ref >39.00)
LDL Cholesterol: 87 mg/dL (ref 0–99)
NonHDL: 106.73
Total CHOL/HDL Ratio: 4
Triglycerides: 97 mg/dL (ref 0.0–149.0)
VLDL: 19.4 mg/dL (ref 0.0–40.0)

## 2016-05-08 MED ORDER — ATORVASTATIN CALCIUM 20 MG PO TABS: ORAL_TABLET | ORAL | Status: DC

## 2016-05-08 MED ORDER — FENOFIBRATE 160 MG PO TABS: 160.0000 mg | ORAL_TABLET | Freq: Every day | ORAL | Status: DC

## 2016-05-08 MED ORDER — METOPROLOL SUCCINATE ER 25 MG PO TB24: 25.0000 mg | ORAL_TABLET | Freq: Every morning | ORAL | Status: DC

## 2016-05-08 MED ORDER — RAMIPRIL 5 MG PO CAPS: 5.0000 mg | ORAL_CAPSULE | Freq: Two times a day (BID) | ORAL | Status: DC

## 2016-05-08 NOTE — Progress Notes (Signed)
Pre visit review using our clinic review tool, if applicable. No additional management support is needed unless otherwise documented below in the visit note. 
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
## 2016-05-08 NOTE — Telephone Encounter (Signed)
Follow-up      The pt is calling back to let the nurse know to leave the information in detail message on the phone that is fine.

## 2016-05-08 NOTE — Telephone Encounter (Signed)
Returned call to patient abd u/s results given.

## 2016-05-08 NOTE — Patient Instructions (Signed)
  Adjustment Disorder Adjustment disorder is an unusually severe reaction to a stressful life event, such as the loss of a job or physical illness. The event may be any stressful event other than the loss of a loved one. Adjustment disorder may affect your feelings, your thinking, how you act, or a combination of these. It may interfere with personal relationships or with the way you are at work, school, or home. People with this disorder are at risk for suicide and substance abuse. They may develop a more serious mental disorder, such as major depressive disorder or post-traumatic stress disorder. SIGNS AND SYMPTOMS  Symptoms may include:  Sadness, depressed mood, or crying spells.  Loss of enjoyment.  Change in appetite or weight.  Sense of loss or hopelessness.  Thoughts of suicide.  Anxiety, worry, or nervousness.  Trouble sleeping.  Avoiding family and friends.  Poor school performance.  Fighting or vandalism.  Reckless driving.  Skipping school.  Poor work performance.  Ignoring bills. Symptoms of adjustment disorder start within 3 months of the stressful life event. They do not last more than 6 months after the event has ended. DIAGNOSIS  To make a diagnosis, your health care provider will ask about what has happened in your life and how it has affected you. He or she may also ask about your medical history and use of medicines, alcohol, and other substances. Your health care provider may do a physical exam and order lab tests or other studies. You may be referred to a mental health specialist for evaluation. TREATMENT  Treatment options include:  Counseling or talk therapy. Talk therapy is usually provided by mental health specialists.  Medicine. Certain medicines may help with depression, anxiety, and sleep.  Support groups. Support groups offer emotional support, advice, and guidance. They are made up of people who have had similar experiences. HOME CARE  INSTRUCTIONS  Keep all follow-up visits as directed by your health care provider. This is important.  Take medicines only as directed by your health care provider. SEEK MEDICAL CARE IF:  Your symptoms get worse.  SEEK IMMEDIATE MEDICAL CARE IF: You have serious thoughts about hurting yourself or someone else. MAKE SURE YOU:  Understand these instructions.  Will watch your condition.  Will get help right away if you are not doing well or get worse.   This information is not intended to replace advice given to you by your health care provider. Make sure you discuss any questions you have with your health care provider.   Document Released: 08/04/2006 Document Revised: 12/21/2014 Document Reviewed: 04/24/2014 Elsevier Interactive Patient Education 2016 [employer]  

## 2016-05-09 LAB — HEPATITIS C ANTIBODY: HCV Ab: NEGATIVE

## 2016-05-11 ENCOUNTER — Other Ambulatory Visit: Payer: Self-pay | Admitting: Neurology

## 2016-05-12 NOTE — Telephone Encounter (Signed)
Looks like medication directions were changed by Dr. Vertell Limber. How would you like to refill medication?

## 2016-05-14 DIAGNOSIS — Z635 Disruption of family by separation and divorce: Secondary | ICD-10-CM | POA: Insufficient documentation

## 2016-05-14 NOTE — Assessment & Plan Note (Signed)
Not legally separated Advised pt go back to counseling Pt reluctant to go Pt in office with wife over 1 hour

## 2016-05-14 NOTE — Assessment & Plan Note (Signed)
con't metoprolol and altace F/u cardiology

## 2016-05-18 ENCOUNTER — Encounter: Payer: Self-pay | Admitting: Neurology

## 2016-05-19 ENCOUNTER — Other Ambulatory Visit: Payer: Self-pay

## 2016-05-19 DIAGNOSIS — F959 Tic disorder, unspecified: Secondary | ICD-10-CM

## 2016-05-19 DIAGNOSIS — G4700 Insomnia, unspecified: Secondary | ICD-10-CM

## 2016-05-19 DIAGNOSIS — Z9989 Dependence on other enabling machines and devices: Secondary | ICD-10-CM

## 2016-05-19 DIAGNOSIS — G4733 Obstructive sleep apnea (adult) (pediatric): Secondary | ICD-10-CM

## 2016-05-19 DIAGNOSIS — G4730 Sleep apnea, unspecified: Principal | ICD-10-CM

## 2016-05-19 MED ORDER — CLONAZEPAM 0.5 MG PO TABS: 0.5000 mg | ORAL_TABLET | Freq: Every evening | ORAL | Status: DC

## 2016-05-19 NOTE — Telephone Encounter (Signed)
RX for clonazepam faxed to [facility]. Received a receipt of confirmation.

## 2016-06-26 ENCOUNTER — Encounter: Payer: Self-pay | Admitting: Internal Medicine

## 2016-08-01 ENCOUNTER — Encounter: Payer: Self-pay | Admitting: Family Medicine

## 2016-08-01 DIAGNOSIS — E785 Hyperlipidemia, unspecified: Secondary | ICD-10-CM

## 2016-08-03 MED ORDER — ATORVASTATIN CALCIUM 20 MG PO TABS
ORAL_TABLET | ORAL | 3 refills | Status: DC
Start: 2016-08-03 — End: 2016-08-21

## 2016-08-21 ENCOUNTER — Encounter: Payer: Self-pay | Admitting: Cardiology

## 2016-08-21 ENCOUNTER — Ambulatory Visit (INDEPENDENT_AMBULATORY_CARE_PROVIDER_SITE_OTHER): Payer: Federal, State, Local not specified - PPO | Admitting: Cardiology

## 2016-08-21 VITALS — BP 122/76 | HR 76 | Ht 76.0 in | Wt 198.0 lb

## 2016-08-21 DIAGNOSIS — I2510 Atherosclerotic heart disease of native coronary artery without angina pectoris: Secondary | ICD-10-CM | POA: Diagnosis not present

## 2016-08-21 DIAGNOSIS — Z79899 Other long term (current) drug therapy: Secondary | ICD-10-CM

## 2016-08-21 DIAGNOSIS — I714 Abdominal aortic aneurysm, without rupture, unspecified: Secondary | ICD-10-CM

## 2016-08-21 DIAGNOSIS — E785 Hyperlipidemia, unspecified: Secondary | ICD-10-CM | POA: Diagnosis not present

## 2016-08-21 MED ORDER — ATORVASTATIN CALCIUM 40 MG PO TABS: ORAL_TABLET | ORAL | 3 refills | Status: DC

## 2016-08-21 NOTE — Patient Instructions (Addendum)
Medication Instructions:  Your physician has recommended you make the following change in your medication:  1) INCREASE Atorvastatin to 40 mg daily  Labwork: Your physician recommends that you return for lab work in: 2 months for fasting blood work.  Lipid profile and Liver function tests.   Testing/Procedures: None ordered  Follow-Up: Your physician wants you to follow-up in: 6 months with [name], PA. You will receive a reminder letter in the mail two months in advance. If you don't receive a letter, please call our office to schedule the follow-up appointment.   If you need a refill on your cardiac medications before your next appointment, please call your pharmacy.  Thank you for choosing [facility]!!

## 2016-08-23 NOTE — Progress Notes (Signed)
Patient ID: [name], male   DOB: 1951/02/18, 65 y.o.   MRN: [id number] PCP: Dr. Etter Sjogren  65 yo with history of CAD s/p CABG and AAA presents for cardiology followup.  He had CABG in 2002. Last cath was in 5/12.  EF was 50% on LV-gram, and 3/5 grafts were patent.   He has had problems with neck pain, had cervical spine fusion in 2/17.  Wife has a number of medical problems, he cares for her. No chest pain.  No exertional dyspnea.    ECG: NSR, inferior and anterolateral T wave inversions  Labs (4/14): K 3.6, creatinine 1.2, LDL 82, HDL 35 Labs (3/15): LDL 93, HDL 41 Labs (4/15): K 3.8, creatinine 1.1 Labs (5/17): LDL 87, HDL 37, K 4.5, creatinine 1.14  PMH: 1. CAD: s/p CABG 2002. LHC (5/12) with EF 50%; LIMA-LAD atretic, SVG-OM patent, SVG-ramus patent, seq SVG-RCA and PDA with RCA arm patent and PDA arm occluded.   2. HTN 3. AAA: 3.7 cm on [place] in 3/14.  3.7 cm on [place] in 3/15.  3.7 cm on [place] in 5/17.  4. OSA: On CPAP 5. GERD 6. Low back pain s/p surgery 7. Hyperlipidemia 8. Cervical fusion 2/17.  SH: Married, 5 kids, retired from the [employer], lives [place].   FH: CAD  Current Outpatient Prescriptions  Medication Sig Dispense Refill  . alfuzosin (UROXATRAL) 10 MG 24 hr tablet Take 1 tablet by mouth daily.  11  . aspirin 81 MG EC tablet Take 81 mg by mouth at bedtime.     Marland Kitchen atorvastatin (LIPITOR) 40 MG tablet TAKE 1 TABLET (20 MG TOTAL) BY MOUTH DAILY. 90 tablet 3  . clonazePAM (KLONOPIN) 0.5 MG tablet Take 1-4 tablets (0.5-2 mg total) by mouth every evening. At bedtime as directed 360 tablet 3  . Coenzyme Q10 (CO Q 10) 100 MG CAPS Take 100 mg by mouth daily.    . famotidine (PEPCID) 20 MG tablet Take 20 mg by mouth daily.    . fenofibrate 160 MG tablet Take 1 tablet (160 mg total) by mouth at bedtime. 90 tablet 3  . gabapentin (NEURONTIN) 100 MG capsule 1 in AM, one at mid day, and 3 in PM. 450 capsule 1  . KRILL OIL PO Take 500 mg by mouth daily.    . metoprolol  succinate (TOPROL-XL) 25 MG 24 hr tablet Take 1 tablet (25 mg total) by mouth every morning. 90 tablet 3  . mirtazapine (REMERON) 45 MG tablet Take 1 tablet (45 mg total) by mouth at bedtime. 90 tablet 3  . mupirocin ointment (BACTROBAN) 2 % Apply 1 application topically 2 (two) times daily.     . nitroGLYCERIN (NITROSTAT) 0.4 MG SL tablet Place 1 tablet (0.4 mg total) under the tongue every 5 (five) minutes as needed. for chest pain 25 tablet 3  . oxyCODONE-acetaminophen (PERCOCET) 10-325 MG tablet Take 1 tablet by mouth every 4 (four) hours as needed for pain. 60 tablet 0  . ramipril (ALTACE) 5 MG capsule Take 1 capsule (5 mg total) by mouth 2 (two) times daily. 180 capsule 3  . tiZANidine (ZANAFLEX) 4 MG tablet TAKE 1&1/2 TABLETS TWICE A DAY AS NEEDED 270 tablet 1  . traMADol (ULTRAM) 50 MG tablet Take 50 mg by mouth every 8 (eight) hours as needed for severe pain.    Marland Kitchen triamcinolone cream (KENALOG) 0.1 % Apply 1 application topically daily as needed (AS NEEDED FOR CONTACT DERMATIIS).     Marland Kitchen  Turmeric 500 MG CAPS Take 500 mg by mouth daily.     . valACYclovir (VALTREX) 500 MG tablet TAKE 1 TABLET (500 MG TOTAL) BY MOUTH 2 (TWO) TIMES DAILY AS NEEDED. FOR FLARE UP OF COLD SORE 60 tablet 0   No current facility-administered medications for this visit.     BP 122/76   Pulse 76   Ht 6\' 4"  (1.93 m)   Wt 198 lb (89.8 kg)   BMI 24.10 kg/m  General: NAD Neck: No JVD, no thyromegaly or thyroid nodule.  Lungs: Clear to auscultation bilaterally with normal respiratory effort. CV: Nondisplaced PMI.  Heart regular S1/S2, no S3/S4, no murmur.  No peripheral edema.  No carotid bruit.  Normal pedal pulses.  Abdomen: Soft, nontender, no hepatosplenomegaly, no distention.  Skin: Intact without lesions or rashes.  Neurologic: Alert and oriented x 3.  Psych: Normal affect. Extremities: No clubbing or cyanosis.  HEENT: Facial tic  Assessment/Plan:  1. CAD: s/p CABG.  Last cath in 5/12 showed 3/5 patent  grafts. No ischemic symptoms.  Continue ASA 81, statin, ramipril, and Toprol XL.  2. AAA: Stable abdominal US.  Repeat in 5/18.   3. Hyperlipidemia:  LDL mildly elevated in 5/17.  Goal LDL < 70.  Increase atorvastatin to 40 mg daily with lipids/LFTs in 2 months.   Followup in 6 months with [name].   Loralie Champagne 08/23/2016

## 2016-08-24 ENCOUNTER — Ambulatory Visit (AMBULATORY_SURGERY_CENTER): Payer: Self-pay | Admitting: *Deleted

## 2016-08-24 VITALS — Ht 75.5 in | Wt 198.0 lb

## 2016-08-24 DIAGNOSIS — Z86010 Personal history of colonic polyps: Secondary | ICD-10-CM

## 2016-08-24 MED ORDER — NA SULFATE-K SULFATE-MG SULF 17.5-3.13-1.6 GM/177ML PO SOLN: 1.0000 | Freq: Once | ORAL | 0 refills | Status: AC

## 2016-08-24 NOTE — Progress Notes (Signed)
No egg or soy allergy known to patient  No issues with past sedation with any surgeries  or procedures, no intubation problems  No diet pills per patient No home 02 use per patient  No blood thinners per patient  Pt denies issues with constipation  No A fib or A flutter   

## 2016-08-31 ENCOUNTER — Encounter: Payer: Self-pay | Admitting: Internal Medicine

## 2016-08-31 ENCOUNTER — Encounter: Payer: Self-pay | Admitting: Family Medicine

## 2016-08-31 NOTE — Telephone Encounter (Signed)
I don't know about the paperwork

## 2016-09-02 ENCOUNTER — Other Ambulatory Visit: Payer: Self-pay | Admitting: Neurology

## 2016-09-07 ENCOUNTER — Encounter: Payer: Federal, State, Local not specified - PPO | Admitting: Internal Medicine

## 2016-09-07 ENCOUNTER — Telehealth: Payer: Self-pay | Admitting: Internal Medicine

## 2016-09-07 NOTE — Telephone Encounter (Signed)
No charge. 

## 2016-09-26 ENCOUNTER — Other Ambulatory Visit: Payer: Self-pay | Admitting: Family Medicine

## 2016-10-01 ENCOUNTER — Other Ambulatory Visit: Payer: Self-pay | Admitting: Neurology

## 2016-10-01 MED ORDER — GABAPENTIN 300 MG PO CAPS: 300.0000 mg | ORAL_CAPSULE | Freq: Every day | ORAL | 0 refills | Status: DC

## 2016-10-01 MED ORDER — GABAPENTIN 100 MG PO CAPS: 100.0000 mg | ORAL_CAPSULE | Freq: Two times a day (BID) | ORAL | 0 refills | Status: DC

## 2016-10-01 NOTE — Telephone Encounter (Signed)
RXs for gabapentin sent electronically to [facility].

## 2016-10-01 NOTE — Telephone Encounter (Signed)
Pt called asking a new rx be sent to his pharmacy for gabapentin (NEURONTIN) 100 MG capsule. Pt states the pharmacy will not fill his rx because his insurance will only allow so many capsules per month.  He was told to ask for new rx : 90 day supply 180 capsules at 100mg  capsules - will cover taking one in the morning and one in the afternoon.   Also needs 90 day supply of 90 capsules at 300 mg capsules for taking once at bedtime. Pt is out of medication.   [id number] [place], [place] - [address]  May call pt :

## 2016-10-13 ENCOUNTER — Other Ambulatory Visit: Payer: Self-pay

## 2016-10-13 DIAGNOSIS — G4700 Insomnia, unspecified: Secondary | ICD-10-CM

## 2016-10-13 DIAGNOSIS — G4730 Sleep apnea, unspecified: Principal | ICD-10-CM

## 2016-10-13 DIAGNOSIS — Z9989 Dependence on other enabling machines and devices: Secondary | ICD-10-CM

## 2016-10-13 DIAGNOSIS — G4733 Obstructive sleep apnea (adult) (pediatric): Secondary | ICD-10-CM

## 2016-10-13 DIAGNOSIS — F959 Tic disorder, unspecified: Secondary | ICD-10-CM

## 2016-10-13 NOTE — Telephone Encounter (Signed)
I spoke to [name] ,who stated that he was refilling a whole bunch of medications online yesterday and that he is not short of Klonopin.   Refill in November as planned. He also asked about his CPAP, and if he is getting a new one. Water collects in his tube. I questioned at what humidity level he uses CPAp and he answered " 6 or  7 " . That's maximum - he was instructed to reduce humidification to level 4 and let us know if the water still collects in the tube.   CD

## 2016-10-13 NOTE — Telephone Encounter (Signed)
I received a refill request from [facility] for pt's klonopin. I called [facility]. We sent in an RX on 05/19/2016 for a 90 day supply with 3 refills. Pt's was shipped a refill on 06/03/16 and on 08/10/2016. They need a new RX because it expires after 6 months. I asked Trish, pharmacy tech, at [facility], why the klonopin was refilled in August of 2017, only 60 days after the first shipment in June? She says that pt's insurance will allow him to receive his klonopin early. I advised her that Dr. Brett Fairy is prescribing 90 day supply at a time. Wannetta Sender says that pt is requesting a refill, and that is why I received this fax, even though pt should not run out until end of November. Will send to Dr. Brett Fairy for review.

## 2016-10-15 ENCOUNTER — Other Ambulatory Visit: Payer: Federal, State, Local not specified - PPO | Admitting: *Deleted

## 2016-10-15 DIAGNOSIS — Z79899 Other long term (current) drug therapy: Secondary | ICD-10-CM

## 2016-10-15 DIAGNOSIS — E785 Hyperlipidemia, unspecified: Secondary | ICD-10-CM

## 2016-10-16 LAB — LIPID PANEL
Cholesterol: 129 mg/dL (ref 125–200)
HDL: 41 mg/dL (ref >=40)
LDL Cholesterol: 69 mg/dL (ref <130)
Total CHOL/HDL Ratio: 3.1 Ratio (ref <=5.0)
Triglycerides: 94 mg/dL (ref <150)
VLDL: 19 mg/dL (ref <30)

## 2016-10-16 LAB — HEPATIC FUNCTION PANEL
ALT: 15 U/L (ref 9–46)
AST: 17 U/L (ref 10–35)
Albumin: 4.3 g/dL (ref 3.6–5.1)
Alkaline Phosphatase: 32 U/L — ABNORMAL LOW (ref 40–115)
Bilirubin, Direct: 0.1 mg/dL (ref <=0.2)
Indirect Bilirubin: 0.4 mg/dL (ref 0.2–1.2)
Total Bilirubin: 0.5 mg/dL (ref 0.2–1.2)
Total Protein: 6.9 g/dL (ref 6.1–8.1)

## 2016-10-21 ENCOUNTER — Other Ambulatory Visit: Payer: Federal, State, Local not specified - PPO

## 2016-10-30 ENCOUNTER — Other Ambulatory Visit: Payer: Self-pay | Admitting: Neurology

## 2016-11-09 ENCOUNTER — Encounter: Payer: Self-pay | Admitting: Neurology

## 2016-11-09 ENCOUNTER — Other Ambulatory Visit: Payer: Self-pay

## 2016-11-09 DIAGNOSIS — Z9989 Dependence on other enabling machines and devices: Secondary | ICD-10-CM

## 2016-11-09 DIAGNOSIS — G4733 Obstructive sleep apnea (adult) (pediatric): Secondary | ICD-10-CM

## 2016-11-09 DIAGNOSIS — G4730 Sleep apnea, unspecified: Principal | ICD-10-CM

## 2016-11-09 DIAGNOSIS — G4700 Insomnia, unspecified: Secondary | ICD-10-CM

## 2016-11-09 DIAGNOSIS — F959 Tic disorder, unspecified: Secondary | ICD-10-CM

## 2016-11-09 MED ORDER — CLONAZEPAM 0.5 MG PO TABS: 0.5000 mg | ORAL_TABLET | Freq: Every evening | ORAL | 1 refills | Status: DC

## 2016-11-09 NOTE — Telephone Encounter (Signed)
RX for clonazepam faxed to [facility]. Received a receipt of confirmation.

## 2016-11-18 ENCOUNTER — Ambulatory Visit (INDEPENDENT_AMBULATORY_CARE_PROVIDER_SITE_OTHER): Payer: Federal, State, Local not specified - PPO | Admitting: Neurology

## 2016-11-18 ENCOUNTER — Encounter: Payer: Self-pay | Admitting: Neurology

## 2016-11-18 DIAGNOSIS — G4730 Sleep apnea, unspecified: Secondary | ICD-10-CM

## 2016-11-18 DIAGNOSIS — F959 Tic disorder, unspecified: Secondary | ICD-10-CM | POA: Diagnosis not present

## 2016-11-18 DIAGNOSIS — Z9989 Dependence on other enabling machines and devices: Secondary | ICD-10-CM | POA: Diagnosis not present

## 2016-11-18 DIAGNOSIS — G4733 Obstructive sleep apnea (adult) (pediatric): Secondary | ICD-10-CM

## 2016-11-18 DIAGNOSIS — G4700 Insomnia, unspecified: Secondary | ICD-10-CM

## 2016-11-18 MED ORDER — CLONAZEPAM 0.5 MG PO TABS: 0.5000 mg | ORAL_TABLET | Freq: Every evening | ORAL | 1 refills | Status: DC

## 2016-11-18 MED ORDER — GABAPENTIN 300 MG PO CAPS: 300.0000 mg | ORAL_CAPSULE | Freq: Every day | ORAL | 0 refills | Status: DC

## 2016-11-18 MED ORDER — MIRTAZAPINE 45 MG PO TABS: 45.0000 mg | ORAL_TABLET | Freq: Every day | ORAL | 3 refills | Status: DC

## 2016-11-18 MED ORDER — GABAPENTIN 100 MG PO CAPS: 100.0000 mg | ORAL_CAPSULE | Freq: Two times a day (BID) | ORAL | 0 refills | Status: DC
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
## 2016-11-18 NOTE — Patient Instructions (Signed)
Clonazepam tablets What is this medicine? CLONAZEPAM (kloe NA ze pam) is a benzodiazepine. It is used to treat certain types of seizures. It is also used to treat panic disorder. This medicine may be used for other purposes; ask your health care provider or pharmacist if you have questions. COMMON BRAND NAME(S): Ceberclon, Klonopin What should I tell my health care provider before I take this medicine? They need to know if you have any of these conditions: -an alcohol or drug abuse problem -bipolar disorder, depression, psychosis or other mental health condition -glaucoma -kidney or liver disease -lung or breathing disease -myasthenia gravis -Parkinson's disease -porphyria -seizures or a history of seizures -suicidal thoughts -an unusual or allergic reaction to clonazepam, other benzodiazepines, foods, dyes, or preservatives -pregnant or trying to get pregnant -breast-feeding How should I use this medicine? Take this medicine by mouth with a glass of water. Follow the directions on the prescription label. If it upsets your stomach, take it with food or milk. Take your medicine at regular intervals. Do not take it more often than directed. Do not stop taking or change the dose except on the advice of your doctor or health care professional. A special MedGuide will be given to you by the pharmacist with each prescription and refill. Be sure to read this information carefully each time. Talk to your pediatrician regarding the use of this medicine in children. Special care may be needed. Overdosage: If you think you have taken too much of this medicine contact a poison control center or emergency room at once. NOTE: This medicine is only for you. Do not share this medicine with others. What if I miss a dose? If you miss a dose, take it as soon as you can. If it is almost time for your next dose, take only that dose. Do not take double or extra doses. What may interact with this medicine? Do  not take this medication with any of the following medicines: -narcotic medicines for cough -sodium oxybate This medicine may also interact with the following medications: -alcohol -antihistamines for allergy, cough and cold -antiviral medicines for HIV or AIDS -certain medicines for anxiety or sleep -certain medicines for depression, like amitriptyline, fluoxetine, sertraline -certain medicines for fungal infections like ketoconazole and itraconazole -certain medicines for seizures like carbamazepine, phenobarbital, phenytoin, primidone -general anesthetics like halothane, isoflurane, methoxyflurane, propofol -local anesthetics like lidocaine, pramoxine, tetracaine -medicines that relax muscles for surgery -narcotic medicines for pain -phenothiazines like chlorpromazine, mesoridazine, prochlorperazine, thioridazine This list may not describe all possible interactions. Give your health care provider a list of all the medicines, herbs, non-prescription drugs, or dietary supplements you use. Also tell them if you smoke, drink alcohol, or use illegal drugs. Some items may interact with your medicine. What should I watch for while using this medicine? Tell your doctor or health care professional if your symptoms do not start to get better or if they get worse. Do not stop taking except on your doctor's advice. You may develop a severe reaction. Your doctor will tell you how much medicine to take. You may get drowsy or dizzy. Do not drive, use machinery, or do anything that needs mental alertness until you know how this medicine affects you. To reduce the risk of dizzy and fainting spells, do not stand or sit up quickly, especially if you are an older patient. Alcohol may increase dizziness and drowsiness. Avoid alcoholic drinks. If you are taking another medicine that also causes drowsiness, you may have more side   effects. Give your health care provider a list of all medicines you use. Your doctor  will tell you how much medicine to take. Do not take more medicine than directed. Call emergency for help if you have problems breathing or unusual sleepiness. The use of this medicine may increase the chance of suicidal thoughts or actions. Pay special attention to how you are responding while on this medicine. Any worsening of mood, or thoughts of suicide or dying should be reported to your health care professional right away. What side effects may I notice from receiving this medicine? Side effects that you should report to your doctor or health care professional as soon as possible: -allergic reactions like skin rash, itching or hives, swelling of the face, lips, or tongue -breathing problems -confusion -loss of balance or coordination -signs and symptoms of low blood pressure like dizziness; feeling faint or lightheaded, falls; unusually weak or tired -suicidal thoughts or mood changes Side effects that usually do not require medical attention (report to your doctor or health care professional if they continue or are bothersome): -dizziness -headache -tiredness -upset stomach This list may not describe all possible side effects. Call your doctor for medical advice about side effects. You may report side effects to FDA at 1-800-FDA-1088. Where should I keep my medicine? Keep out of the reach of children. This medicine can be abused. Keep your medicine in a safe place to protect it from theft. Do not share this medicine with anyone. Selling or giving away this medicine is dangerous and against the law. This medicine may cause accidental overdose and death if taken by other adults, children, or pets. Mix any unused medicine with a substance like cat litter or coffee grounds. Then throw the medicine away in a sealed container like a sealed bag or a coffee can with a lid. Do not use the medicine after the expiration date. Store at room temperature between 15 and 30 degrees C (59 and 86 degrees F).  Protect from light. Keep container tightly closed. NOTE: This sheet is a summary. It may not cover all possible information. If you have questions about this medicine, talk to your doctor, pharmacist, or health care provider.  2017 Elsevier/Gold Standard (2016-05-08 18:46:32)

## 2016-12-21 ENCOUNTER — Telehealth: Payer: Self-pay | Admitting: Neurology

## 2016-12-21 DIAGNOSIS — G4733 Obstructive sleep apnea (adult) (pediatric): Secondary | ICD-10-CM

## 2016-12-21 DIAGNOSIS — Z9989 Dependence on other enabling machines and devices: Principal | ICD-10-CM

## 2016-12-21 NOTE — Telephone Encounter (Signed)
I called pt. He reports that his cpap is not working any longer and he would like to switch to [employer]. He was using [facility] but they do not accept Medicare patients. I will send the referral to [facility] and ask them to find out what the pt needs to replace his cpap.   I called pt and advise him of this. Pt will call me if he needs a follow up within 30-60 days of new/replaced cpap. Pt verbalized understanding.

## 2016-12-21 NOTE — Telephone Encounter (Signed)
Patient called wanting the date of his last sleep study.  I couldn't find it in the chart.  Will you call the patient and let him know the date of last sleep study.

## 2016-12-21 NOTE — Addendum Note (Signed)
Addended by: Lester Thomaston A on: 12/21/2016 02:27 PM   Modules accepted: Orders

## 2016-12-21 NOTE — Telephone Encounter (Signed)
Patient is calling stating his CPAP says motor has exceeded life cycle. Please call and discuss.

## 2017-01-06 ENCOUNTER — Encounter: Payer: Self-pay | Admitting: Cardiology

## 2017-01-23 ENCOUNTER — Encounter: Payer: Self-pay | Admitting: Family Medicine

## 2017-01-25 ENCOUNTER — Other Ambulatory Visit: Payer: Self-pay | Admitting: Family Medicine

## 2017-01-25 DIAGNOSIS — E785 Hyperlipidemia, unspecified: Secondary | ICD-10-CM

## 2017-01-25 MED ORDER — ATORVASTATIN CALCIUM 40 MG PO TABS: ORAL_TABLET | ORAL | 0 refills | Status: DC

## 2017-02-01 ENCOUNTER — Telehealth: Payer: Self-pay | Admitting: Family Medicine

## 2017-02-01 NOTE — Telephone Encounter (Signed)
Pt was seen today. He said that he would like to have his prevnar shot. Pt scheduled for Monday 2/26

## 2017-02-05 ENCOUNTER — Encounter: Payer: Self-pay | Admitting: Internal Medicine

## 2017-02-05 NOTE — Telephone Encounter (Signed)
Please advise if patient can be scheduled for a nurse visit only on Monday 02/08/17 at 2pm for prevnar shot, please advise.

## 2017-02-05 NOTE — Telephone Encounter (Signed)
Pt is due for Prevnar-13 per chart review. Dr. Etter Sjogren, please advise if okay to give at nurse visit.  Pt is also overdue for CPE.  Tiffany, please reach out to him and schedule IPPE/Welcome to Medicare visit w/ PCP. Medicare effective 01/14/17.

## 2017-02-05 NOTE — Telephone Encounter (Signed)
Patient scheduled with PCP due to patient feeling tired, patient will schedule [facility] on Monday after acute appointment.

## 2017-02-08 ENCOUNTER — Ambulatory Visit (INDEPENDENT_AMBULATORY_CARE_PROVIDER_SITE_OTHER): Payer: Medicare Other | Admitting: Family Medicine

## 2017-02-08 ENCOUNTER — Ambulatory Visit: Payer: Federal, State, Local not specified - PPO | Admitting: Family Medicine

## 2017-02-08 ENCOUNTER — Encounter: Payer: Self-pay | Admitting: Family Medicine

## 2017-02-08 VITALS — BP 122/62 | HR 82 | Temp 98.0°F | Resp 16 | Ht 72.0 in | Wt 205.2 lb

## 2017-02-08 DIAGNOSIS — G4700 Insomnia, unspecified: Secondary | ICD-10-CM | POA: Diagnosis not present

## 2017-02-08 DIAGNOSIS — R5383 Other fatigue: Secondary | ICD-10-CM | POA: Diagnosis not present

## 2017-02-08 DIAGNOSIS — Z23 Encounter for immunization: Secondary | ICD-10-CM | POA: Diagnosis not present

## 2017-02-08 DIAGNOSIS — G4730 Sleep apnea, unspecified: Secondary | ICD-10-CM | POA: Diagnosis not present

## 2017-02-08 LAB — POCT URINALYSIS DIPSTICK
Bilirubin, UA: NEGATIVE
Blood, UA: NEGATIVE
Glucose, UA: NEGATIVE
Ketones, UA: NEGATIVE
Leukocytes, UA: NEGATIVE
Nitrite, UA: NEGATIVE
Protein, UA: NEGATIVE
Spec Grav, UA: 1.015
Urobilinogen, UA: 0.2
pH, UA: 6

## 2017-02-08 NOTE — Patient Instructions (Signed)
Insomnia Insomnia is a sleep disorder that makes it difficult to fall asleep or to stay asleep. Insomnia can cause tiredness (fatigue), low energy, difficulty concentrating, mood swings, and poor performance at work or school. There are three different ways to classify insomnia:  Difficulty falling asleep.  Difficulty staying asleep.  Waking up too early in the morning. Any type of insomnia can be long-term (chronic) or short-term (acute). Both are common. Short-term insomnia usually lasts for three months or less. Chronic insomnia occurs at least three times a week for longer than three months. What are the causes? Insomnia may be caused by another condition, situation, or substance, such as:  Anxiety.  Certain medicines.  Gastroesophageal reflux disease (GERD) or other gastrointestinal conditions.  Asthma or other breathing conditions.  Restless legs syndrome, sleep apnea, or other sleep disorders.  Chronic pain.  Menopause. This may include hot flashes.  Stroke.  Abuse of alcohol, tobacco, or illegal drugs.  Depression.  Caffeine.  Neurological disorders, such as Alzheimer disease.  An overactive thyroid (hyperthyroidism). The cause of insomnia may not be known. What increases the risk? Risk factors for insomnia include:  Gender. Women are more commonly affected than men.  Age. Insomnia is more common as you get older.  Stress. This may involve your professional or personal life.  Income. Insomnia is more common in people with lower income.  Lack of exercise.  Irregular work schedule or night shifts.  Traveling between different time zones. What are the signs or symptoms? If you have insomnia, trouble falling asleep or trouble staying asleep is the main symptom. This may lead to other symptoms, such as:  Feeling fatigued.  Feeling nervous about going to sleep.  Not feeling rested in the morning.  Having trouble concentrating.  Feeling irritable,  anxious, or depressed. How is this treated? Treatment for insomnia depends on the cause. If your insomnia is caused by an underlying condition, treatment will focus on addressing the condition. Treatment may also include:  Medicines to help you sleep.  Counseling or therapy.  Lifestyle adjustments. Follow these instructions at home:  Take medicines only as directed by your health care provider.  Keep regular sleeping and waking hours. Avoid naps.  Keep a sleep diary to help you and your health care provider figure out what could be causing your insomnia. Include:  When you sleep.  When you wake up during the night.  How well you sleep.  How rested you feel the next day.  Any side effects of medicines you are taking.  What you eat and drink.  Make your bedroom a comfortable place where it is easy to fall asleep:  Put up shades or special blackout curtains to block light from outside.  Use a white noise machine to block noise.  Keep the temperature cool.  Exercise regularly as directed by your health care provider. Avoid exercising right before bedtime.  Use relaxation techniques to manage stress. Ask your health care provider to suggest some techniques that may work well for you. These may include:  Breathing exercises.  Routines to release muscle tension.  Visualizing peaceful scenes.  Cut back on alcohol, caffeinated beverages, and cigarettes, especially close to bedtime. These can disrupt your sleep.  Do not overeat or eat spicy foods right before bedtime. This can lead to digestive discomfort that can make it hard for you to sleep.  Limit screen use before bedtime. This includes:  Watching TV.  Using your smartphone, tablet, and computer.  Stick to a   routine. This can help you fall asleep faster. Try to do a quiet activity, brush your teeth, and go to bed at the same time each night.  Get out of bed if you are still awake after 15 minutes of trying to  sleep. Keep the lights down, but try reading or doing a quiet activity. When you feel sleepy, go back to bed.  Make sure that you drive carefully. Avoid driving if you feel very sleepy.  Keep all follow-up appointments as directed by your health care provider. This is important. Contact a health care provider if:  You are tired throughout the day or have trouble in your daily routine due to sleepiness.  You continue to have sleep problems or your sleep problems get worse. Get help right away if:  You have serious thoughts about hurting yourself or someone else. This information is not intended to replace advice given to you by your health care provider. Make sure you discuss any questions you have with your health care provider. Document Released: 11/27/2000 Document Revised: 05/01/2016 Document Reviewed: 08/31/2014 Elsevier Interactive Patient Education  2017 Elsevier Inc.  
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
## 2017-02-08 NOTE — Assessment & Plan Note (Signed)
Pt is not sleeping -- he needs to discuss with neuro but not convinced it is from cpap Pt is very stressed.  He has friends/ family he can talk to  Number given for counselors in case he changes his mind He will also discuss frequent urination 5due to prostate with urology Ov lasted > 45 min with greater than 50% face to face discussing his stress and fatigue Check labs

## 2017-02-08 NOTE — Assessment & Plan Note (Addendum)
Per neuro On klonopin and remeron Needs f/u with neuro for sleep---  Pt is not getting good sleep

## 2017-02-08 NOTE — Progress Notes (Signed)
Pre visit review using our clinic review tool, if applicable. No additional management support is needed unless otherwise documented below in the visit note. 

## 2017-02-09 LAB — COMPREHENSIVE METABOLIC PANEL
ALT: 15 U/L (ref 0–53)
AST: 15 U/L (ref 0–37)
Albumin: 4.4 g/dL (ref 3.5–5.2)
Alkaline Phosphatase: 32 U/L — ABNORMAL LOW (ref 39–117)
BUN: 17 mg/dL (ref 6–23)
CO2: 30 mEq/L (ref 19–32)
Calcium: 9.4 mg/dL (ref 8.4–10.5)
Chloride: 105 mEq/L (ref 96–112)
Creatinine, Ser: 1.21 mg/dL (ref 0.40–1.50)
GFR: 63.96 mL/min (ref >60.00)
Glucose, Bld: 132 mg/dL — ABNORMAL HIGH (ref 70–99)
Potassium: 3.8 mEq/L (ref 3.5–5.1)
Sodium: 141 mEq/L (ref 135–145)
Total Bilirubin: 0.4 mg/dL (ref 0.2–1.2)
Total Protein: 6.7 g/dL (ref 6.0–8.3)

## 2017-02-09 LAB — CBC WITH DIFFERENTIAL/PLATELET
Basophils Absolute: 0 10*3/uL (ref 0.0–0.1)
Basophils Relative: 0.7 % (ref 0.0–3.0)
Eosinophils Absolute: 0 10*3/uL (ref 0.0–0.7)
Eosinophils Relative: 0.7 % (ref 0.0–5.0)
HCT: 34.3 % — ABNORMAL LOW (ref 39.0–52.0)
Hemoglobin: 11.8 g/dL — ABNORMAL LOW (ref 13.0–17.0)
Lymphocytes Relative: 24.9 % (ref 12.0–46.0)
Lymphs Abs: 1 10*3/uL (ref 0.7–4.0)
MCHC: 34.5 g/dL (ref 30.0–36.0)
MCV: 93.3 fl (ref 78.0–100.0)
Monocytes Absolute: 0.2 10*3/uL (ref 0.1–1.0)
Monocytes Relative: 5 % (ref 3.0–12.0)
Neutro Abs: 2.9 10*3/uL (ref 1.4–7.7)
Neutrophils Relative %: 68.7 % (ref 43.0–77.0)
Platelets: 207 10*3/uL (ref 150.0–400.0)
RBC: 3.68 Mil/uL — ABNORMAL LOW (ref 4.22–5.81)
RDW: 13.9 % (ref 11.5–15.5)
WBC: 4.2 10*3/uL (ref 4.0–10.5)

## 2017-02-09 LAB — TSH: TSH: 1.1 u[IU]/mL (ref 0.35–4.50)

## 2017-02-09 LAB — VITAMIN B12: Vitamin B-12: 125 pg/mL — ABNORMAL LOW (ref 211–911)

## 2017-02-12 ENCOUNTER — Other Ambulatory Visit: Payer: Self-pay | Admitting: Family Medicine

## 2017-02-12 DIAGNOSIS — E119 Type 2 diabetes mellitus without complications: Secondary | ICD-10-CM

## 2017-02-12 DIAGNOSIS — E785 Hyperlipidemia, unspecified: Secondary | ICD-10-CM

## 2017-02-12 DIAGNOSIS — E538 Deficiency of other specified B group vitamins: Secondary | ICD-10-CM

## 2017-02-12 MED ORDER — VITAMIN B-12 1000 MCG SL SUBL: 1.0000 | SUBLINGUAL_TABLET | Freq: Every day | SUBLINGUAL | 1 refills | Status: AC

## 2017-02-14 IMAGING — RF DG CERVICAL SPINE 2 OR 3 VIEWS
1 series · 2 of 2 positions shown · non-contrast
Comparison: 10/28/2015

CLINICAL DATA: C6-7 posterior cervical fusion for cervicalgia.

EXAM:
DG C-ARM 61-120 MIN; CERVICAL SPINE - 2-3 VIEW

[Series 1: run · 2 of 2 slices shown]
[im 1/2]
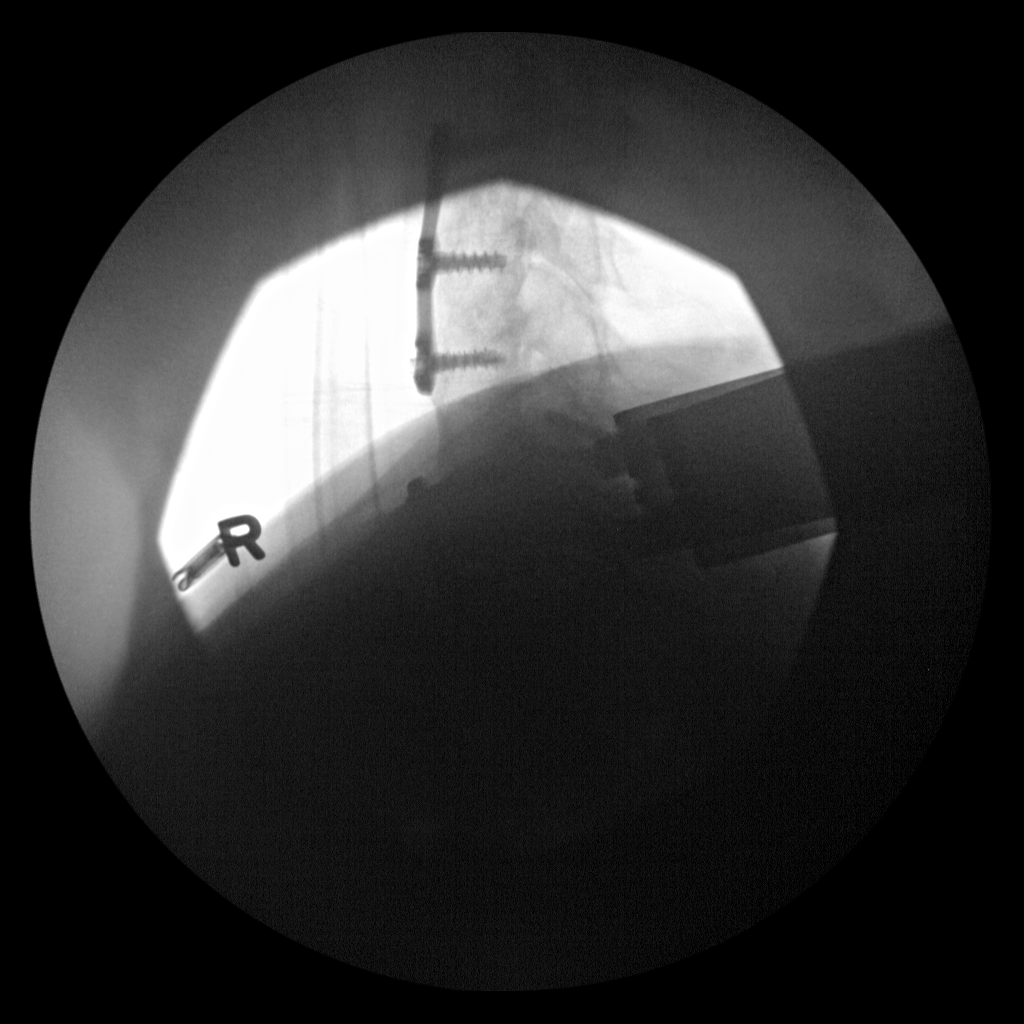
[im 2/2]
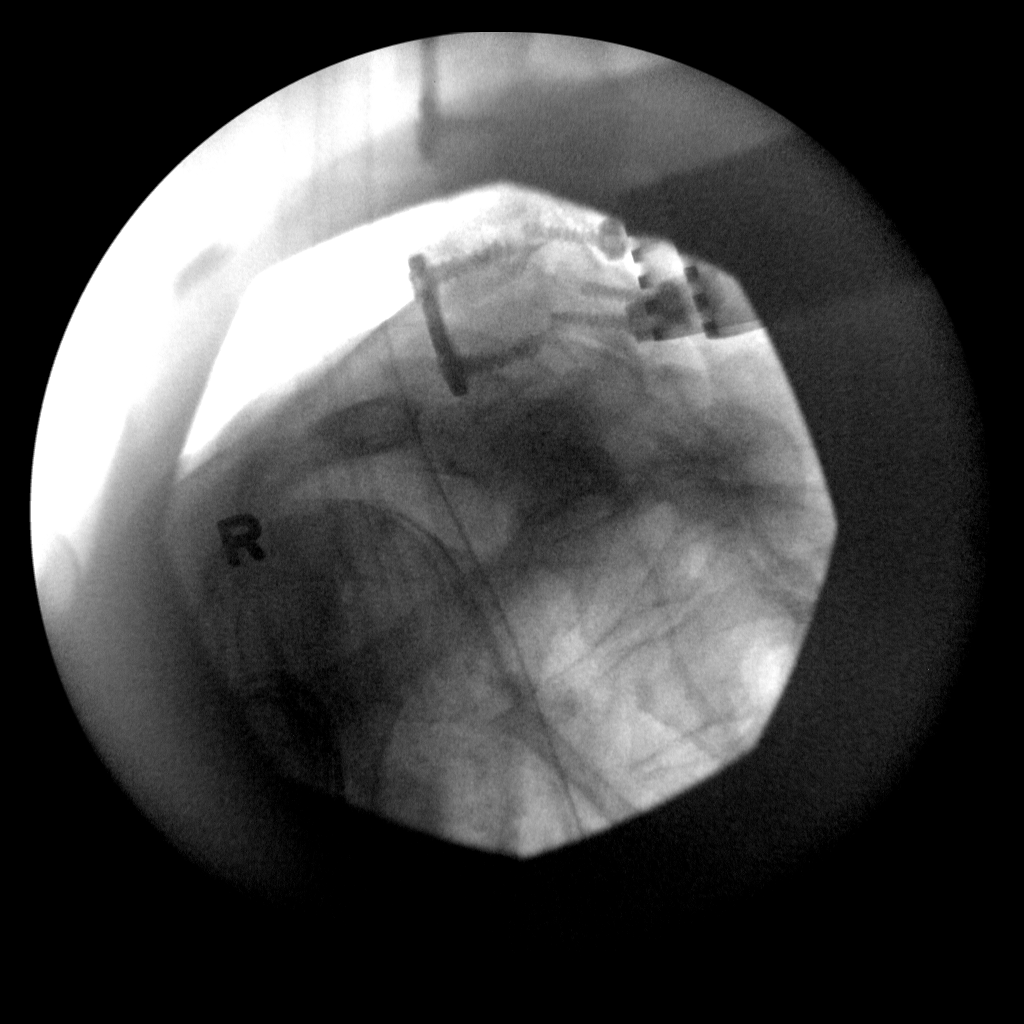

[2 of 2 positions shown; findings below may reference images not displayed]

FINDINGS: 2 fluoroscopic spot images of the cervical spine demonstrate the 3
level anterior plate and screw fixator (believed to be at the
C3-C4-C5 level) and a 2 level anterior plate and screw fixator
(believed to be at C6-7). One of the 2 lower screws in the C7
vertebral body from the anterior plate fixator might be
discontinuous.

There are tissue spreaders in place at C6-7 and facet screws
posteriorly at the C6-7 level faintly seen.
IMPRESSION: 1. Intraoperative images showing the posterior screw placement at
C6-7. Stable appearance of the anterior plate and screw fixators.

## 2017-02-15 ENCOUNTER — Other Ambulatory Visit: Payer: Self-pay | Admitting: Neurology

## 2017-02-15 DIAGNOSIS — F959 Tic disorder, unspecified: Secondary | ICD-10-CM

## 2017-02-15 DIAGNOSIS — G4730 Sleep apnea, unspecified: Principal | ICD-10-CM

## 2017-02-15 DIAGNOSIS — G4700 Insomnia, unspecified: Secondary | ICD-10-CM

## 2017-02-25 ENCOUNTER — Encounter: Payer: Self-pay | Admitting: Physician Assistant

## 2017-03-01 ENCOUNTER — Ambulatory Visit: Payer: Federal, State, Local not specified - PPO | Admitting: Physician Assistant

## 2017-03-08 ENCOUNTER — Ambulatory Visit (INDEPENDENT_AMBULATORY_CARE_PROVIDER_SITE_OTHER): Payer: Medicare Other | Admitting: Physician Assistant

## 2017-03-08 ENCOUNTER — Encounter: Payer: Self-pay | Admitting: Physician Assistant

## 2017-03-08 VITALS — BP 120/70 | HR 66 | Ht 75.5 in | Wt 202.0 lb

## 2017-03-08 DIAGNOSIS — E784 Other hyperlipidemia: Secondary | ICD-10-CM | POA: Diagnosis not present

## 2017-03-08 DIAGNOSIS — I714 Abdominal aortic aneurysm, without rupture, unspecified: Secondary | ICD-10-CM

## 2017-03-08 DIAGNOSIS — I2510 Atherosclerotic heart disease of native coronary artery without angina pectoris: Secondary | ICD-10-CM | POA: Diagnosis not present

## 2017-03-08 DIAGNOSIS — I10 Essential (primary) hypertension: Secondary | ICD-10-CM

## 2017-03-08 DIAGNOSIS — E7849 Other hyperlipidemia: Secondary | ICD-10-CM

## 2017-03-08 NOTE — Patient Instructions (Addendum)
Medication Instructions:  Your physician recommends that you continue on your current medications as directed. Please refer to the Current Medication list given to you today.  Labwork: NONE OREDERED  Testing/Procedures: Your physician has requested that you have an abdominal aorta duplex. During this test, an ultrasound is used to evaluate the aorta. Allow 30 minutes for this exam. Do not eat after midnight the day before and avoid carbonated beverages ; THIS IS TO BE DONE IN  MAY 2018  Follow-Up: Your physician wants you to follow-up in: [place]. You will receive a reminder letter in the mail two months in advance. If you don't receive a letter, please call our office to schedule the follow-up appointment.  Any Other Special Instructions Will Be Listed Below (If Applicable).  If you need a refill on your cardiac medications before your next appointment, please call your pharmacy.
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
## 2017-03-15 ENCOUNTER — Other Ambulatory Visit (INDEPENDENT_AMBULATORY_CARE_PROVIDER_SITE_OTHER): Payer: Medicare Other

## 2017-03-15 DIAGNOSIS — E785 Hyperlipidemia, unspecified: Secondary | ICD-10-CM

## 2017-03-15 DIAGNOSIS — E538 Deficiency of other specified B group vitamins: Secondary | ICD-10-CM | POA: Diagnosis not present

## 2017-03-15 DIAGNOSIS — E119 Type 2 diabetes mellitus without complications: Secondary | ICD-10-CM | POA: Diagnosis not present

## 2017-03-15 LAB — VITAMIN B12: Vitamin B-12: 1138 pg/mL — ABNORMAL HIGH (ref 211–911)

## 2017-03-15 LAB — COMPREHENSIVE METABOLIC PANEL
ALT: 14 U/L (ref 0–53)
AST: 15 U/L (ref 0–37)
Albumin: 4.5 g/dL (ref 3.5–5.2)
Alkaline Phosphatase: 33 U/L — ABNORMAL LOW (ref 39–117)
BUN: 19 mg/dL (ref 6–23)
CO2: 30 mEq/L (ref 19–32)
Calcium: 9.4 mg/dL (ref 8.4–10.5)
Chloride: 107 mEq/L (ref 96–112)
Creatinine, Ser: 1.28 mg/dL (ref 0.40–1.50)
GFR: 59.93 mL/min — ABNORMAL LOW (ref >60.00)
Glucose, Bld: 92 mg/dL (ref 70–99)
Potassium: 4 mEq/L (ref 3.5–5.1)
Sodium: 141 mEq/L (ref 135–145)
Total Bilirubin: 0.5 mg/dL (ref 0.2–1.2)
Total Protein: 7 g/dL (ref 6.0–8.3)

## 2017-03-15 LAB — HEMOGLOBIN A1C: Hgb A1c MFr Bld: 5.6 % (ref 4.6–6.5)

## 2017-03-18 ENCOUNTER — Ambulatory Visit (AMBULATORY_SURGERY_CENTER): Payer: Self-pay | Admitting: *Deleted

## 2017-03-18 VITALS — Ht 75.5 in | Wt 202.0 lb

## 2017-03-18 DIAGNOSIS — Z86010 Personal history of colonic polyps: Secondary | ICD-10-CM

## 2017-03-18 NOTE — Progress Notes (Signed)
No egg or soy allergy known to patient  No issues with past sedation with any surgeries  or procedures, no intubation problems  No diet pills per patient No home 02 use per patient  No blood thinners per patient  Pt denies issues with constipation  No A fib or A flutter   emmi video to e mail  Pt has a suprep kit at home  From a previous PV

## 2017-03-19 ENCOUNTER — Encounter: Payer: Self-pay | Admitting: Family Medicine

## 2017-03-19 NOTE — Telephone Encounter (Signed)
See labs 

## 2017-03-31 ENCOUNTER — Encounter: Payer: Self-pay | Admitting: Internal Medicine

## 2017-03-31 ENCOUNTER — Ambulatory Visit (AMBULATORY_SURGERY_CENTER): Payer: Medicare Other | Admitting: Internal Medicine

## 2017-03-31 VITALS — BP 122/71 | HR 60 | Temp 98.6°F | Resp 12 | Ht 75.0 in | Wt 202.0 lb

## 2017-03-31 DIAGNOSIS — Z86010 Personal history of colonic polyps: Secondary | ICD-10-CM | POA: Diagnosis present

## 2017-03-31 DIAGNOSIS — D123 Benign neoplasm of transverse colon: Secondary | ICD-10-CM | POA: Diagnosis not present

## 2017-03-31 MED ORDER — SODIUM CHLORIDE 0.9 % IV SOLN: 500.0000 mL | INTRAVENOUS | Status: DC

## 2017-03-31 NOTE — Op Note (Signed)
[facility] Patient Name: [name] Procedure Date: 03/31/2017 2:58 PM MRN: [id number] Endoscopist: Docia Chuck. Henrene Pastor , MD Age: 66 Referring MD:  Date of Birth: 29-Feb-1951 Gender: Male Account #: [id number] Procedure:                Colonoscopy with cold snare x 1 Indications:              High risk colon cancer surveillance: Personal                            history of multiple (3 or more) adenomas. Prior                            exams elsewhere and here. Last 2012 Medicines:                Monitored Anesthesia Care Procedure:                Pre-Anesthesia Assessment:                           - Prior to the procedure, a History and Physical                            was performed, and patient medications and                            allergies were reviewed. The patient's tolerance of                            previous anesthesia was also reviewed. The risks                            and benefits of the procedure and the sedation                            options and risks were discussed with the patient.                            All questions were answered, and informed consent                            was obtained. Prior Anticoagulants: The patient has                            taken no previous anticoagulant or antiplatelet                            agents. ASA Grade Assessment: II - A patient with                            mild systemic disease. After reviewing the risks                            and benefits, the patient was deemed in  satisfactory condition to undergo the procedure.                           After obtaining informed consent, the colonoscope                            was passed under direct vision. Throughout the                            procedure, the patient's blood pressure, pulse, and                            oxygen saturations were monitored continuously. The                            Model CF-HQ190L  [id number]) scope was introduced                            through the anus and advanced to the the cecum,                            identified by appendiceal orifice and ileocecal                            valve. The ileocecal valve, appendiceal orifice,                            and rectum were photographed. The quality of the                            bowel preparation was excellent. The colonoscopy                            was performed without difficulty. The patient                            tolerated the procedure well. The bowel preparation                            used was SUPREP. Scope In: 3:14:05 PM Scope Out: 3:24:36 PM Scope Withdrawal Time: 0 hours 8 minutes 47 seconds  Total Procedure Duration: 0 hours 10 minutes 31 seconds  Findings:                 A 6 mm polyp was found in the distal transverse                            colon. The polyp was sessile. The polyp was removed                            with a cold snare. Resection and retrieval were                            complete.  Multiple small and large-mouthed diverticula were                            found in the left colon.                           Internal hemorrhoids were found during retroflexion.                           The exam was otherwise without abnormality on                            direct and retroflexion views. Complications:            No immediate complications. Estimated blood loss:                            None. Estimated Blood Loss:     Estimated blood loss: none. Impression:               - One 6 mm polyp in the distal transverse colon,                            removed with a cold snare. Resected and retrieved.                           - Diverticulosis in the left colon.                           - Internal hemorrhoids.                           - The examination was otherwise normal on direct                            and retroflexion  views. Recommendation:           - Repeat colonoscopy in 5 years for surveillance.                           - Patient has a contact number available for                            emergencies. The signs and symptoms of potential                            delayed complications were discussed with the                            patient. Return to normal activities tomorrow.                            Written discharge instructions were provided to the                            patient.                           -  Resume previous diet.                           - Continue present medications.                           - Await pathology results. Docia Chuck. Henrene Pastor, MD 03/31/2017 3:28:19 PM This report has been signed electronically.

## 2017-03-31 NOTE — Progress Notes (Signed)
Called to room to assist during endoscopic procedure.  Patient ID and intended procedure confirmed with present staff. Received instructions for my participation in the procedure from the performing physician.  

## 2017-03-31 NOTE — Progress Notes (Signed)
A and O x3. Report to RN. Tolerated MAC anesthesia well.

## 2017-03-31 NOTE — Patient Instructions (Signed)
YOU HAD AN ENDOSCOPIC PROCEDURE TODAY AT THE [facility]:   Refer to the procedure report that was given to you for any specific questions about what was found during the examination.  If the procedure report does not answer your questions, please call your gastroenterologist to clarify.  If you requested that your care partner not be given the details of your procedure findings, then the procedure report has been included in a sealed envelope for you to review at your convenience later.  YOU SHOULD EXPECT: Some feelings of bloating in the abdomen. Passage of more gas than usual.  Walking can help get rid of the air that was put into your GI tract during the procedure and reduce the bloating. If you had a lower endoscopy (such as a colonoscopy or flexible sigmoidoscopy) you may notice spotting of blood in your stool or on the toilet paper. If you underwent a bowel prep for your procedure, you may not have a normal bowel movement for a few days.  Please Note:  You might notice some irritation and congestion in your nose or some drainage.  This is from the oxygen used during your procedure.  There is no need for concern and it should clear up in a day or so.  SYMPTOMS TO REPORT IMMEDIATELY:   Following lower endoscopy (colonoscopy or flexible sigmoidoscopy):  Excessive amounts of blood in the stool  Significant tenderness or worsening of abdominal pains  Swelling of the abdomen that is new, acute  Fever of 100F or higher  For urgent or emergent issues, a gastroenterologist can be reached at any hour by calling [phone].   DIET:  We do recommend a small meal at first, but then you may proceed to your regular diet.  Drink plenty of fluids but you should avoid alcoholic beverages for 24 hours.  MEDICATIONS: Continue present medications.  Please see handouts given to you by your recovery nurse.  ACTIVITY:  You should plan to take it easy for the rest of today and you should NOT  DRIVE or use heavy machinery until tomorrow (because of the sedation medicines used during the test).    FOLLOW UP: Our staff will call the number listed on your records the next business day following your procedure to check on you and address any questions or concerns that you may have regarding the information given to you following your procedure. If we do not reach you, we will leave a message.  However, if you are feeling well and you are not experiencing any problems, there is no need to return our call.  We will assume that you have returned to your regular daily activities without incident.  If any biopsies were taken you will be contacted by phone or by letter within the next 1-3 weeks.  Please call us at [phone] if you have not heard about the biopsies in 3 weeks.   Thank you for allowing us to provide for your healthcare needs today.   SIGNATURES/CONFIDENTIALITY: You and/or your care partner have signed paperwork which will be entered into your electronic medical record.  These signatures attest to the fact that that the information above on your After Visit Summary has been reviewed and is understood.  Full responsibility of the confidentiality of this discharge information lies with you and/or your care-partner. 

## 2017-04-01 ENCOUNTER — Telehealth: Payer: Self-pay | Admitting: *Deleted

## 2017-04-01 NOTE — Telephone Encounter (Signed)
  Follow up Call-  Call back number 03/31/2017  Post procedure Call Back phone  # [phone]  Permission to leave phone message Yes  Some recent data might be hidden    No answer at # given.  LM on VM that we would call back this afternoon.

## 2017-04-01 NOTE — Telephone Encounter (Signed)
  Follow up Call-  Call back number 03/31/2017  Post procedure Call Back phone  # [phone]  Permission to leave phone message Yes  Some recent data might be hidden    Attempted follow up call with no answer- LM on AM that identifies pt by first and last name to return call if has problems or concerns Stephen Galloway RN

## 2017-04-06 ENCOUNTER — Encounter: Payer: Self-pay | Admitting: Internal Medicine

## 2017-04-23 ENCOUNTER — Other Ambulatory Visit: Payer: Self-pay | Admitting: Neurology

## 2017-04-26 ENCOUNTER — Other Ambulatory Visit (HOSPITAL_COMMUNITY): Payer: Medicare Other

## 2017-04-26 ENCOUNTER — Ambulatory Visit (HOSPITAL_COMMUNITY)
Admission: RE | Admit: 2017-04-26 | Discharge: 2017-04-26 | Disposition: A | Discharge status: Home / Self Care | Payer: Medicare Other | Source: Ambulatory Visit | Attending: Internal Medicine | Admitting: Internal Medicine

## 2017-04-26 DIAGNOSIS — I700 Atherosclerosis of aorta: Secondary | ICD-10-CM | POA: Insufficient documentation

## 2017-04-26 DIAGNOSIS — I714 Abdominal aortic aneurysm, without rupture, unspecified: Secondary | ICD-10-CM

## 2017-04-26 DIAGNOSIS — I771 Stricture of artery: Secondary | ICD-10-CM | POA: Insufficient documentation

## 2017-04-28 ENCOUNTER — Telehealth: Payer: Self-pay | Admitting: *Deleted

## 2017-04-28 ENCOUNTER — Encounter: Payer: Self-pay | Admitting: Physician Assistant

## 2017-04-28 DIAGNOSIS — I714 Abdominal aortic aneurysm, without rupture, unspecified: Secondary | ICD-10-CM

## 2017-04-28 NOTE — Telephone Encounter (Signed)
Pt notified of AAA results and findings by phone with verbal understanding. Pt denies any left leg/buttock pain when walking. Pt states only some knee pain that is more orthopedic. Pt stats he will let us know if he notices any left leg/buttock pain when walking that goes away with rest, if so pt aware we will schedule LEA w/ABI's.. Pt agreeable to repeat AAA Korea in 1 year. Pt also has recall in Epic for Dr. Erin Sons, PA. I advised pt when scheduling appts for both the AAA [place] and f/u appt to try and have the US done about a week or so the f/u, that way we will have the results for at the f/u appt with the provider. Pt said that is a good idea. Pt thanked me for my call tonight and the concern about any left leg/buttock pain.

## 2017-04-28 NOTE — Telephone Encounter (Signed)
-----   Message from Liliane Shi, [place] sent at 04/28/2017  3:41 PM EDT ----- Please call the patient. The abdominal aortic aneurysm measurement is stable without significant change. Continue current management and arrange a follow up abdominal aortic aneurysm ultrasound in 1 year. He does have some plaque build up in the L common iliac artery. Ask if he has any L leg/buttocks pain when he walks that goes away with rest.  If so, arrange ABIs/arterial dopplers to assess for significant peripheral vascular disease. Please fax a copy of this study result to his PCP:  Ann Held, DO  Thanks! [name], PA-C    04/28/2017 3:29 PM

## 2017-05-06 ENCOUNTER — Other Ambulatory Visit: Payer: Self-pay | Admitting: Neurology

## 2017-05-06 DIAGNOSIS — F959 Tic disorder, unspecified: Secondary | ICD-10-CM

## 2017-05-06 DIAGNOSIS — G4700 Insomnia, unspecified: Secondary | ICD-10-CM

## 2017-05-06 DIAGNOSIS — G4730 Sleep apnea, unspecified: Principal | ICD-10-CM

## 2017-05-17 ENCOUNTER — Telehealth: Payer: Self-pay | Admitting: *Deleted

## 2017-05-17 NOTE — Telephone Encounter (Signed)
Received request for Medical records from [facility], forwarded to [place] for email/scan/SLS 06/04

## 2017-05-20 ENCOUNTER — Ambulatory Visit (INDEPENDENT_AMBULATORY_CARE_PROVIDER_SITE_OTHER): Payer: Medicare Other | Admitting: Neurology

## 2017-05-20 ENCOUNTER — Encounter: Payer: Self-pay | Admitting: Neurology

## 2017-05-20 DIAGNOSIS — Z9989 Dependence on other enabling machines and devices: Secondary | ICD-10-CM

## 2017-05-20 DIAGNOSIS — I2510 Atherosclerotic heart disease of native coronary artery without angina pectoris: Secondary | ICD-10-CM

## 2017-05-20 DIAGNOSIS — G4733 Obstructive sleep apnea (adult) (pediatric): Secondary | ICD-10-CM | POA: Diagnosis not present

## 2017-05-20 DIAGNOSIS — G4700 Insomnia, unspecified: Secondary | ICD-10-CM | POA: Diagnosis not present

## 2017-05-20 DIAGNOSIS — F959 Tic disorder, unspecified: Secondary | ICD-10-CM

## 2017-05-20 DIAGNOSIS — G4730 Sleep apnea, unspecified: Secondary | ICD-10-CM

## 2017-05-20 MED ORDER — CLONAZEPAM 0.5 MG PO TABS: 1.0000 mg | ORAL_TABLET | Freq: Every evening | ORAL | 1 refills | Status: DC

## 2017-05-20 MED ORDER — MIRTAZAPINE 45 MG PO TABS: 45.0000 mg | ORAL_TABLET | Freq: Every day | ORAL | 3 refills | Status: DC

## 2017-05-20 MED ORDER — TRAZODONE HCL 100 MG PO TABS: 100.0000 mg | ORAL_TABLET | Freq: Every day | ORAL | 3 refills | Status: DC

## 2017-05-20 NOTE — Addendum Note (Signed)
Addended by: Larey Seat on: 05/20/2017 03:46 PM   Modules accepted: Orders
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
## 2017-05-20 NOTE — Addendum Note (Signed)
Addended by: Larey Seat on: 05/20/2017 03:53 PM   Modules accepted: Orders

## 2017-05-20 NOTE — Patient Instructions (Signed)
Trazodone tablets What is this medicine? TRAZODONE (TRAZ oh done) is used to treat depression. This medicine may be used for other purposes; ask your health care provider or pharmacist if you have questions. COMMON BRAND NAME(S): Desyrel What should I tell my health care provider before I take this medicine? They need to know if you have any of these conditions: -attempted suicide or thinking about it -bipolar disorder -bleeding problems -glaucoma -heart disease, or previous heart attack -irregular heart beat -kidney or liver disease -low levels of sodium in the blood -an unusual or allergic reaction to trazodone, other medicines, foods, dyes or preservatives -pregnant or trying to get pregnant -breast-feeding How should I use this medicine? Take this medicine by mouth with a glass of water. Follow the directions on the prescription label. Take this medicine shortly after a meal or a light snack. Take your medicine at regular intervals. Do not take your medicine more often than directed. Do not stop taking this medicine suddenly except upon the advice of your doctor. Stopping this medicine too quickly may cause serious side effects or your condition may worsen. A special MedGuide will be given to you by the pharmacist with each prescription and refill. Be sure to read this information carefully each time. Talk to your pediatrician regarding the use of this medicine in children. Special care may be needed. Overdosage: If you think you have taken too much of this medicine contact a poison control center or emergency room at once. NOTE: This medicine is only for you. Do not share this medicine with others. What if I miss a dose? If you miss a dose, take it as soon as you can. If it is almost time for your next dose, take only that dose. Do not take double or extra doses. What may interact with this medicine? Do not take this medicine with any of the following medications: -certain medicines  for fungal infections like fluconazole, itraconazole, ketoconazole, posaconazole, voriconazole -cisapride -dofetilide -dronedarone -linezolid -MAOIs like Carbex, Eldepryl, Marplan, Nardil, and Parnate -mesoridazine -methylene blue (injected into a vein) -pimozide -saquinavir -thioridazine -ziprasidone This medicine may also interact with the following medications: -alcohol -antiviral medicines for HIV or AIDS -aspirin and aspirin-like medicines -barbiturates like phenobarbital -certain medicines for blood pressure, heart disease, irregular heart beat -certain medicines for depression, anxiety, or psychotic disturbances -certain medicines for migraine headache like almotriptan, eletriptan, frovatriptan, naratriptan, rizatriptan, sumatriptan, zolmitriptan -certain medicines for seizures like carbamazepine and phenytoin -certain medicines for sleep -certain medicines that treat or prevent blood clots like dalteparin, enoxaparin, warfarin -digoxin -fentanyl -lithium -NSAIDS, medicines for pain and inflammation, like ibuprofen or naproxen -other medicines that prolong the QT interval (cause an abnormal heart rhythm) -rasagiline -supplements like St. John's wort, kava kava, valerian -tramadol -tryptophan This list may not describe all possible interactions. Give your health care provider a list of all the medicines, herbs, non-prescription drugs, or dietary supplements you use. Also tell them if you smoke, drink alcohol, or use illegal drugs. Some items may interact with your medicine. What should I watch for while using this medicine? Tell your doctor if your symptoms do not get better or if they get worse. Visit your doctor or health care professional for regular checks on your progress. Because it may take several weeks to see the full effects of this medicine, it is important to continue your treatment as prescribed by your doctor. Patients and their families should watch out for new  or worsening thoughts of suicide or depression. Also   watch out for sudden changes in feelings such as feeling anxious, agitated, panicky, irritable, hostile, aggressive, impulsive, severely restless, overly excited and hyperactive, or not being able to sleep. If this happens, especially at the beginning of treatment or after a change in dose, call your health care professional. You may get drowsy or dizzy. Do not drive, use machinery, or do anything that needs mental alertness until you know how this medicine affects you. Do not stand or sit up quickly, especially if you are an older patient. This reduces the risk of dizzy or fainting spells. Alcohol may interfere with the effect of this medicine. Avoid alcoholic drinks. This medicine may cause dry eyes and blurred vision. If you wear contact lenses you may feel some discomfort. Lubricating drops may help. See your eye doctor if the problem does not go away or is severe. Your mouth may get dry. Chewing sugarless gum, sucking hard candy and drinking plenty of water may help. Contact your doctor if the problem does not go away or is severe. What side effects may I notice from receiving this medicine? Side effects that you should report to your doctor or health care professional as soon as possible: -allergic reactions like skin rash, itching or hives, swelling of the face, lips, or tongue -elevated mood, decreased need for sleep, racing thoughts, impulsive behavior -confusion -fast, irregular heartbeat -feeling faint or lightheaded, falls -feeling agitated, angry, or irritable -loss of balance or coordination -painful or prolonged erections -restlessness, pacing, inability to keep still -suicidal thoughts or other mood changes -tremors -trouble sleeping -seizures -unusual bleeding or bruising Side effects that usually do not require medical attention (report to your doctor or health care professional if they continue or are bothersome): -change in  sex drive or performance -change in appetite or weight -constipation -headache -muscle aches or pains -nausea This list may not describe all possible side effects. Call your doctor for medical advice about side effects. You may report side effects to FDA at 1-800-FDA-1088. Where should I keep my medicine? Keep out of the reach of children. Store at room temperature between 15 and 30 degrees C (59 to 86 degrees F). Protect from light. Keep container tightly closed. Throw away any unused medicine after the expiration date. NOTE: This sheet is a summary. It may not cover all possible information. If you have questions about this medicine, talk to your doctor, pharmacist, or health care provider.  2018 Elsevier/Gold Standard (2016-04-30 16:57:05)  

## 2017-05-25 ENCOUNTER — Encounter: Payer: Self-pay | Admitting: Family Medicine

## 2017-05-27 ENCOUNTER — Encounter: Payer: Self-pay | Admitting: Neurology

## 2017-05-30 ENCOUNTER — Other Ambulatory Visit: Payer: Self-pay | Admitting: Family Medicine

## 2017-05-30 DIAGNOSIS — I10 Essential (primary) hypertension: Secondary | ICD-10-CM

## 2017-06-01 ENCOUNTER — Other Ambulatory Visit: Payer: Self-pay

## 2017-06-01 NOTE — Progress Notes (Signed)
I called [facility] and asked them to d/c the trazodone, per pt request, and to continue mirtazapine and clonazepam. Spoke with Vaughan Basta, pharmacist.

## 2017-07-15 ENCOUNTER — Encounter: Payer: Self-pay | Admitting: Family Medicine

## 2017-07-15 DIAGNOSIS — I10 Essential (primary) hypertension: Secondary | ICD-10-CM

## 2017-07-16 MED ORDER — RAMIPRIL 5 MG PO CAPS: 5.0000 mg | ORAL_CAPSULE | Freq: Two times a day (BID) | ORAL | 0 refills | Status: DC

## 2017-08-02 ENCOUNTER — Encounter: Payer: Self-pay | Admitting: Neurology

## 2017-08-02 ENCOUNTER — Other Ambulatory Visit: Payer: Self-pay | Admitting: Neurology

## 2017-08-02 DIAGNOSIS — G4730 Sleep apnea, unspecified: Principal | ICD-10-CM

## 2017-08-02 DIAGNOSIS — G4700 Insomnia, unspecified: Secondary | ICD-10-CM

## 2017-08-02 DIAGNOSIS — F959 Tic disorder, unspecified: Secondary | ICD-10-CM

## 2017-08-02 MED ORDER — CLONAZEPAM 0.5 MG PO TABS: 0.5000 mg | ORAL_TABLET | Freq: Every day | ORAL | 0 refills | Status: DC

## 2017-08-03 ENCOUNTER — Other Ambulatory Visit: Payer: Self-pay | Admitting: Neurology

## 2017-08-03 ENCOUNTER — Encounter: Payer: Self-pay | Admitting: Neurology

## 2017-08-03 DIAGNOSIS — G4730 Sleep apnea, unspecified: Principal | ICD-10-CM

## 2017-08-03 DIAGNOSIS — F959 Tic disorder, unspecified: Secondary | ICD-10-CM

## 2017-08-03 DIAGNOSIS — G4700 Insomnia, unspecified: Secondary | ICD-10-CM

## 2017-08-03 MED ORDER — GABAPENTIN 100 MG PO CAPS: 100.0000 mg | ORAL_CAPSULE | Freq: Two times a day (BID) | ORAL | 0 refills | Status: DC

## 2017-08-03 MED ORDER — GABAPENTIN 300 MG PO CAPS: 300.0000 mg | ORAL_CAPSULE | Freq: Every day | ORAL | 0 refills | Status: DC

## 2017-08-04 ENCOUNTER — Encounter: Payer: Self-pay | Admitting: Neurology

## 2017-08-11 ENCOUNTER — Telehealth: Payer: Self-pay | Admitting: Neurology

## 2017-08-11 NOTE — Telephone Encounter (Signed)
Both gabapentin prescriptions a re for 90 days and 3 refills. I can not refill for more than 90 days at a time. CD

## 2017-08-11 NOTE — Telephone Encounter (Signed)
I can write for 4 times 100 mg daily- is that acceptable to him> ?

## 2017-08-11 NOTE — Telephone Encounter (Signed)
Patient send a you tube message - wanted 90 days gabapentin at 300 and 100 mg to be increased to longer coverage interval. I cannot fill for more than 90 days at a time.  That's a regulation, I cannot change.

## 2017-08-12 ENCOUNTER — Telehealth: Payer: Self-pay | Admitting: Neurology

## 2017-08-12 ENCOUNTER — Other Ambulatory Visit: Payer: Self-pay | Admitting: Neurology

## 2017-08-12 ENCOUNTER — Encounter: Payer: Self-pay | Admitting: Neurology

## 2017-08-12 MED ORDER — GABAPENTIN 100 MG PO CAPS: 100.0000 mg | ORAL_CAPSULE | Freq: Four times a day (QID) | ORAL | 0 refills | Status: DC

## 2017-08-12 NOTE — Telephone Encounter (Signed)
Called and spoke with him regarding our my chart message and explained that Dr Brett Fairy would be fine with prescribing the Neurontin 100 mg 4 times a day. [name] was receptive to that recommendation and I have told him that I would send it to the [facility]. Stephen Jaggi states that the 1) 0.mg Clonazepam is not helping at bedtime. He mentioned what he had been on in the past. I explained to the patient that his orders are written for that as we just restarted him back on the medication. Dr Dohmeier was hesitant about restarting him on that medication inially anyway and so she only started him at that dose. Pt states I just know my history and the 1 pill "aint gonna cut it" I informed the patient that I would address his concern with Dr Brett Fairy and that I would see I she had any other recommendations.  After speaking with Dr Brett Fairy she is not wanting to increase his dosage and she recommends the patient see a sleep psychologist. I will offer that to the patient and see if he is willing to start this. If so we will order it.

## 2017-08-25 ENCOUNTER — Encounter: Payer: Self-pay | Admitting: Neurology

## 2017-08-31 NOTE — Telephone Encounter (Signed)
Klonopin should only be taken once a day for sleep- other anxiety treatment needs to come form  Psychiatry , not sleep neurology. These are policy changes. They also serve the safety of our patients, as we get older- and more sensitive to klonopin. CD

## 2017-09-20 ENCOUNTER — Other Ambulatory Visit: Payer: Self-pay | Admitting: Neurology

## 2017-09-21 DIAGNOSIS — R972 Elevated prostate specific antigen [PSA]: Secondary | ICD-10-CM | POA: Diagnosis not present

## 2017-09-27 DIAGNOSIS — R351 Nocturia: Secondary | ICD-10-CM | POA: Diagnosis not present

## 2017-09-27 DIAGNOSIS — N401 Enlarged prostate with lower urinary tract symptoms: Secondary | ICD-10-CM | POA: Diagnosis not present

## 2017-09-27 DIAGNOSIS — N503 Cyst of epididymis: Secondary | ICD-10-CM | POA: Diagnosis not present

## 2017-10-24 ENCOUNTER — Encounter: Payer: Self-pay | Admitting: Family Medicine

## 2017-10-25 MED ORDER — VALACYCLOVIR HCL 500 MG PO TABS: ORAL_TABLET | ORAL | 1 refills | Status: DC

## 2017-10-29 ENCOUNTER — Encounter: Payer: Self-pay | Admitting: Family Medicine

## 2017-10-29 ENCOUNTER — Ambulatory Visit (INDEPENDENT_AMBULATORY_CARE_PROVIDER_SITE_OTHER): Payer: Medicare Other | Admitting: Family Medicine

## 2017-10-29 VITALS — BP 116/64 | HR 71 | Temp 98.1°F | Wt 175.4 lb

## 2017-10-29 DIAGNOSIS — E785 Hyperlipidemia, unspecified: Secondary | ICD-10-CM | POA: Diagnosis not present

## 2017-10-29 DIAGNOSIS — I10 Essential (primary) hypertension: Secondary | ICD-10-CM

## 2017-10-29 DIAGNOSIS — I2510 Atherosclerotic heart disease of native coronary artery without angina pectoris: Secondary | ICD-10-CM | POA: Diagnosis not present

## 2017-10-29 DIAGNOSIS — I25118 Atherosclerotic heart disease of native coronary artery with other forms of angina pectoris: Secondary | ICD-10-CM | POA: Diagnosis not present

## 2017-10-29 LAB — CBC WITH DIFFERENTIAL/PLATELET
Basophils Absolute: 0 10*3/uL (ref 0.0–0.1)
Basophils Relative: 0.5 % (ref 0.0–3.0)
Eosinophils Absolute: 0 10*3/uL (ref 0.0–0.7)
Eosinophils Relative: 0.8 % (ref 0.0–5.0)
HCT: 40.9 % (ref 39.0–52.0)
Hemoglobin: 14 g/dL (ref 13.0–17.0)
Lymphocytes Relative: 19 % (ref 12.0–46.0)
Lymphs Abs: 0.8 10*3/uL (ref 0.7–4.0)
MCHC: 34.4 g/dL (ref 30.0–36.0)
MCV: 93.2 fl (ref 78.0–100.0)
Monocytes Absolute: 0.3 10*3/uL (ref 0.1–1.0)
Monocytes Relative: 7.4 % (ref 3.0–12.0)
Neutro Abs: 2.9 10*3/uL (ref 1.4–7.7)
Neutrophils Relative %: 72.3 % (ref 43.0–77.0)
Platelets: 181 10*3/uL (ref 150.0–400.0)
RBC: 4.38 Mil/uL (ref 4.22–5.81)
RDW: 13.3 % (ref 11.5–15.5)
WBC: 4.1 10*3/uL (ref 4.0–10.5)

## 2017-10-29 LAB — COMPREHENSIVE METABOLIC PANEL
ALT: 16 U/L (ref 0–53)
AST: 16 U/L (ref 0–37)
Albumin: 4.5 g/dL (ref 3.5–5.2)
Alkaline Phosphatase: 52 U/L (ref 39–117)
BUN: 9 mg/dL (ref 6–23)
CO2: 30 mEq/L (ref 19–32)
Calcium: 9.9 mg/dL (ref 8.4–10.5)
Chloride: 101 mEq/L (ref 96–112)
Creatinine, Ser: 0.92 mg/dL (ref 0.40–1.50)
GFR: 87.56 mL/min (ref >60.00)
Glucose, Bld: 118 mg/dL — ABNORMAL HIGH (ref 70–99)
Potassium: 4.7 mEq/L (ref 3.5–5.1)
Sodium: 136 mEq/L (ref 135–145)
Total Bilirubin: 0.5 mg/dL (ref 0.2–1.2)
Total Protein: 7.1 g/dL (ref 6.0–8.3)

## 2017-10-29 LAB — LIPID PANEL
Cholesterol: 174 mg/dL (ref 0–200)
HDL: 44.8 mg/dL (ref >39.00)
LDL Cholesterol: 111 mg/dL — ABNORMAL HIGH (ref 0–99)
NonHDL: 128.85
Total CHOL/HDL Ratio: 4
Triglycerides: 88 mg/dL (ref 0.0–149.0)
VLDL: 17.6 mg/dL (ref 0.0–40.0)
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
## 2017-10-29 NOTE — Patient Instructions (Signed)

## 2017-10-31 NOTE — Assessment & Plan Note (Signed)
Well controlled, no changes to meds. Encouraged heart healthy diet such as the DASH diet and exercise as tolerated.  °

## 2017-10-31 NOTE — Assessment & Plan Note (Signed)
Tolerating statin, encouraged heart healthy diet, avoid trans fats, minimize simple carbs and saturated fats. Increase exercise as tolerated 

## 2017-11-08 ENCOUNTER — Telehealth: Payer: Self-pay | Admitting: *Deleted

## 2017-11-08 ENCOUNTER — Other Ambulatory Visit: Payer: Self-pay | Admitting: *Deleted

## 2017-11-08 DIAGNOSIS — E785 Hyperlipidemia, unspecified: Secondary | ICD-10-CM

## 2017-11-08 NOTE — Telephone Encounter (Signed)
See my chart message

## 2017-11-18 NOTE — Telephone Encounter (Signed)
noted 

## 2017-11-23 ENCOUNTER — Ambulatory Visit: Payer: Federal, State, Local not specified - PPO | Admitting: Neurology

## 2017-11-25 ENCOUNTER — Encounter: Payer: Self-pay | Admitting: Neurology

## 2017-12-01 ENCOUNTER — Ambulatory Visit (INDEPENDENT_AMBULATORY_CARE_PROVIDER_SITE_OTHER): Payer: Medicare Other | Admitting: Neurology

## 2017-12-01 ENCOUNTER — Encounter: Payer: Self-pay | Admitting: Neurology

## 2017-12-01 VITALS — BP 144/71 | HR 75 | Ht 75.0 in | Wt 177.0 lb

## 2017-12-01 DIAGNOSIS — I2510 Atherosclerotic heart disease of native coronary artery without angina pectoris: Secondary | ICD-10-CM | POA: Diagnosis not present

## 2017-12-01 DIAGNOSIS — Z9989 Dependence on other enabling machines and devices: Secondary | ICD-10-CM

## 2017-12-01 DIAGNOSIS — F959 Tic disorder, unspecified: Secondary | ICD-10-CM | POA: Diagnosis not present

## 2017-12-01 DIAGNOSIS — G4730 Sleep apnea, unspecified: Secondary | ICD-10-CM | POA: Diagnosis not present

## 2017-12-01 DIAGNOSIS — G4700 Insomnia, unspecified: Secondary | ICD-10-CM

## 2017-12-01 DIAGNOSIS — G4733 Obstructive sleep apnea (adult) (pediatric): Secondary | ICD-10-CM

## 2017-12-01 MED ORDER — CLONAZEPAM 0.5 MG PO TABS: 0.5000 mg | ORAL_TABLET | Freq: Two times a day (BID) | ORAL | 1 refills | Status: DC

## 2017-12-01 MED ORDER — MIRTAZAPINE 45 MG PO TABS: 45.0000 mg | ORAL_TABLET | Freq: Every day | ORAL | 3 refills | Status: DC

## 2017-12-01 MED ORDER — TIZANIDINE HCL 4 MG PO TABS: ORAL_TABLET | ORAL | 3 refills | Status: DC

## 2017-12-01 MED ORDER — GABAPENTIN 100 MG PO CAPS: 100.0000 mg | ORAL_CAPSULE | Freq: Four times a day (QID) | ORAL | 0 refills | Status: DC
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
## 2017-12-01 NOTE — Addendum Note (Signed)
Addended by: Larey Seat on: 12/01/2017 02:44 PM   Modules accepted: Orders

## 2017-12-20 ENCOUNTER — Other Ambulatory Visit: Payer: Self-pay | Admitting: Neurology

## 2018-01-12 ENCOUNTER — Encounter: Payer: Self-pay | Admitting: Neurology

## 2018-01-13 ENCOUNTER — Telehealth: Payer: Self-pay | Admitting: Neurology
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
## 2018-01-13 NOTE — Telephone Encounter (Signed)
Received notification of this patient having new insurance   "This patient has gone Medicare and in order to bill Medicare we will need the Face to face prior to his Sleep Study Dated 04/10/2012. Otherwise he will have to have a new Face to Face discussing a new Sleep Study and a New PSG. He is still renting his Cpap but has been on hold with no payment since May. I will need this documentation as soon as possible, I can let him keep his Cpap still while we get him qualified."  I have reached out to our medical records dept and received all that we have and there is no office visit prior to the sleep study that was completed on 04/10/2012. The patient unfortunately will have to come in for an office visit to state that he needs a sleep study completed and we order a sleep study to be completed so that he can get everything authorized to go through under medicare.  Pt has called and scheduled an apt with Cecille Rubin so that we can start the process.

## 2018-01-17 ENCOUNTER — Ambulatory Visit (INDEPENDENT_AMBULATORY_CARE_PROVIDER_SITE_OTHER): Payer: Medicare Other | Admitting: Nurse Practitioner

## 2018-01-17 ENCOUNTER — Encounter: Payer: Self-pay | Admitting: Nurse Practitioner

## 2018-01-17 VITALS — BP 145/83 | HR 70 | Ht 75.5 in | Wt 178.2 lb

## 2018-01-17 DIAGNOSIS — G4733 Obstructive sleep apnea (adult) (pediatric): Secondary | ICD-10-CM | POA: Diagnosis not present

## 2018-01-17 NOTE — Patient Instructions (Signed)
Due to insurance change patient needs new sleep study per Medicare guidelines.   He has a long history of obstructive sleep apnea and has been compliant with CPAP in the past He will follow-up after the study for compliance check

## 2018-01-17 NOTE — Progress Notes (Signed)
I agree with the assessment and plan as directed by NP .The patient is known to me .   Anaissa Macfadden, MD  

## 2018-01-18 ENCOUNTER — Other Ambulatory Visit: Payer: Self-pay | Admitting: Neurology

## 2018-02-10 DIAGNOSIS — M25561 Pain in right knee: Secondary | ICD-10-CM | POA: Diagnosis not present

## 2018-02-15 ENCOUNTER — Other Ambulatory Visit

## 2018-03-07 DIAGNOSIS — N401 Enlarged prostate with lower urinary tract symptoms: Secondary | ICD-10-CM | POA: Diagnosis not present

## 2018-03-12 ENCOUNTER — Other Ambulatory Visit: Payer: Self-pay | Admitting: Neurology

## 2018-03-14 DIAGNOSIS — N401 Enlarged prostate with lower urinary tract symptoms: Secondary | ICD-10-CM | POA: Diagnosis not present

## 2018-03-14 DIAGNOSIS — R351 Nocturia: Secondary | ICD-10-CM | POA: Diagnosis not present

## 2018-04-15 ENCOUNTER — Ambulatory Visit (INDEPENDENT_AMBULATORY_CARE_PROVIDER_SITE_OTHER): Payer: Medicare Other | Admitting: Neurology

## 2018-04-15 DIAGNOSIS — G4733 Obstructive sleep apnea (adult) (pediatric): Secondary | ICD-10-CM

## 2018-04-21 DIAGNOSIS — K122 Cellulitis and abscess of mouth: Secondary | ICD-10-CM | POA: Diagnosis not present

## 2018-04-24 ENCOUNTER — Telehealth: Payer: Self-pay | Admitting: Neurology

## 2018-04-24 ENCOUNTER — Other Ambulatory Visit: Payer: Self-pay | Admitting: Neurology

## 2018-04-24 DIAGNOSIS — G4733 Obstructive sleep apnea (adult) (pediatric): Secondary | ICD-10-CM
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
## 2018-04-26 ENCOUNTER — Encounter: Payer: Self-pay | Admitting: Neurology

## 2018-05-17 ENCOUNTER — Encounter: Payer: Self-pay | Admitting: Family Medicine

## 2018-05-17 MED ORDER — VALACYCLOVIR HCL 500 MG PO TABS: ORAL_TABLET | ORAL | 1 refills | Status: DC

## 2018-05-19 ENCOUNTER — Ambulatory Visit (HOSPITAL_COMMUNITY)
Admission: RE | Admit: 2018-05-19 | Discharge: 2018-05-19 | Disposition: A | Discharge status: Home / Self Care | Payer: Medicare Other | Source: Ambulatory Visit | Attending: Cardiology | Admitting: Cardiology

## 2018-05-19 DIAGNOSIS — Z87891 Personal history of nicotine dependence: Secondary | ICD-10-CM | POA: Insufficient documentation

## 2018-05-19 DIAGNOSIS — E785 Hyperlipidemia, unspecified: Secondary | ICD-10-CM | POA: Diagnosis not present

## 2018-05-19 DIAGNOSIS — I10 Essential (primary) hypertension: Secondary | ICD-10-CM | POA: Insufficient documentation

## 2018-05-19 DIAGNOSIS — I714 Abdominal aortic aneurysm, without rupture, unspecified: Secondary | ICD-10-CM

## 2018-05-19 DIAGNOSIS — I708 Atherosclerosis of other arteries: Secondary | ICD-10-CM | POA: Diagnosis not present

## 2018-05-20 ENCOUNTER — Encounter: Payer: Self-pay | Admitting: Physician Assistant

## 2018-05-20 ENCOUNTER — Telehealth: Payer: Self-pay | Admitting: *Deleted

## 2018-05-20 NOTE — Telephone Encounter (Signed)
Left message to go over AAA U/S results.

## 2018-05-20 NOTE — Telephone Encounter (Signed)
-----   Message from Liliane Shi, [place] sent at 05/20/2018 12:29 PM EDT ----- Please call the patient. The AAA ultrasound shows no change in the abdominal aortic aneurysm - it still measures 3.8 cm.  There is moderate stenosis in the L common iliac artery (in the L lower abdomen/groin).  If he is having any pain in his L leg or exertional L leg pain, please arrange LEA dopplers/ABIs.  Repeat in 1 year.  Please fax a copy to PCP:  Carollee Herter, Alferd Apa, DO  [name], PA-C    05/20/2018 12:21 PM

## 2018-05-23 ENCOUNTER — Telehealth: Payer: Self-pay | Admitting: *Deleted

## 2018-05-23 NOTE — Telephone Encounter (Signed)
-----   Message from Liliane Shi, [place] sent at 05/20/2018 12:29 PM EDT ----- Please call the patient. The AAA ultrasound shows no change in the abdominal aortic aneurysm - it still measures 3.8 cm.  There is moderate stenosis in the L common iliac artery (in the L lower abdomen/groin).  If he is having any pain in his L leg or exertional L leg pain, please arrange LEA dopplers/ABIs.  Repeat in 1 year.  Please fax a copy to PCP:  Carollee Herter, Alferd Apa, DO  [name], PA-C    05/20/2018 12:21 PM

## 2018-05-23 NOTE — Telephone Encounter (Signed)
While I was speaking with the pt about his test results he asked me if Dr. Caryl Comes appt could be moved up sooner for his wife. See phone note below.  Pt then states to me that his wife who is a pt of Dr. Olin Pia has recently had a number of falls, TIA's and a concussion. Due to the falls he states she has a blood clot on her hip that is not going away like it should. He states his wife has Myotonic Dystrophy. He states she has an appt with Dr. Caryl Comes 06/07/18 however he feels that his wife really should be seen sooner for someone to look at the blood clot. I explained to him that I can d/w PA in the morning for further recommendation as to appt for pt's wife. Pt thanked me for any help that we can give.

## 2018-05-23 NOTE — Telephone Encounter (Signed)
Pt aware of results. Will d/w PA further at appt 05/24/18 appt.

## 2018-05-24 ENCOUNTER — Ambulatory Visit (INDEPENDENT_AMBULATORY_CARE_PROVIDER_SITE_OTHER): Payer: Medicare Other | Admitting: Physician Assistant

## 2018-05-24 ENCOUNTER — Encounter: Payer: Self-pay | Admitting: Physician Assistant

## 2018-05-24 VITALS — BP 152/80 | HR 56 | Ht 75.5 in | Wt 183.8 lb

## 2018-05-24 DIAGNOSIS — I739 Peripheral vascular disease, unspecified: Secondary | ICD-10-CM

## 2018-05-24 DIAGNOSIS — I2510 Atherosclerotic heart disease of native coronary artery without angina pectoris: Secondary | ICD-10-CM

## 2018-05-24 DIAGNOSIS — E785 Hyperlipidemia, unspecified: Secondary | ICD-10-CM | POA: Diagnosis not present

## 2018-05-24 DIAGNOSIS — I10 Essential (primary) hypertension: Secondary | ICD-10-CM

## 2018-05-24 DIAGNOSIS — I714 Abdominal aortic aneurysm, without rupture, unspecified: Secondary | ICD-10-CM

## 2018-05-24 MED ORDER — ASPIRIN EC 81 MG PO TBEC: 81.0000 mg | DELAYED_RELEASE_TABLET | Freq: Every day | ORAL | Status: AC

## 2018-05-24 MED ORDER — EZETIMIBE 10 MG PO TABS: 10.0000 mg | ORAL_TABLET | Freq: Every day | ORAL | 3 refills | Status: DC
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
## 2018-05-24 NOTE — Patient Instructions (Signed)
Medication Instructions:  1. START ZETIA 10 MG DAILY   Labwork: FASTING LIPID AND LIVER PANEL TO BE DONE IN 3 MONTHS  Testing/Procedures: Your physician has requested that you have a lower extremity arterial exercise duplex TO BE DONE WITH ABI'S.  During this test, exercise and ultrasound are used to evaluate arterial blood flow in the legs. Allow one hour for this exam. There are no restrictions or special instructions.   Follow-Up: Your physician wants you to follow-up in: [place], [facility]  You will receive a reminder letter in the mail two months in advance. If you don't receive a letter, please call our office to schedule the follow-up appointment.   Any Other Special Instructions Will Be Listed Below (If Applicable).  MONITOR BLOOD PRESSURE A FEW TIMES A WEEK AND CALL OR SEND READINGS TO [place], [place]   If you need a refill on your cardiac medications before your next appointment, please call your pharmacy.

## 2018-05-26 ENCOUNTER — Other Ambulatory Visit: Payer: Self-pay | Admitting: Physician Assistant

## 2018-05-26 ENCOUNTER — Other Ambulatory Visit: Payer: Self-pay | Admitting: Neurology

## 2018-05-26 ENCOUNTER — Other Ambulatory Visit: Payer: Self-pay | Admitting: *Deleted

## 2018-05-26 DIAGNOSIS — I739 Peripheral vascular disease, unspecified: Secondary | ICD-10-CM

## 2018-05-26 MED ORDER — EZETIMIBE 10 MG PO TABS: 10.0000 mg | ORAL_TABLET | Freq: Every day | ORAL | 3 refills | Status: DC

## 2018-05-31 ENCOUNTER — Ambulatory Visit (HOSPITAL_COMMUNITY)
Admission: RE | Admit: 2018-05-31 | Discharge: 2018-05-31 | Disposition: A | Discharge status: Home / Self Care | Payer: Medicare Other | Source: Ambulatory Visit | Attending: Cardiovascular Disease | Admitting: Cardiovascular Disease

## 2018-05-31 ENCOUNTER — Telehealth: Payer: Self-pay | Admitting: Neurology

## 2018-05-31 DIAGNOSIS — I714 Abdominal aortic aneurysm, without rupture: Secondary | ICD-10-CM | POA: Diagnosis not present

## 2018-05-31 DIAGNOSIS — I739 Peripheral vascular disease, unspecified: Secondary | ICD-10-CM | POA: Insufficient documentation

## 2018-05-31 DIAGNOSIS — I2510 Atherosclerotic heart disease of native coronary artery without angina pectoris: Secondary | ICD-10-CM | POA: Insufficient documentation

## 2018-05-31 DIAGNOSIS — Z87891 Personal history of nicotine dependence: Secondary | ICD-10-CM | POA: Insufficient documentation

## 2018-05-31 DIAGNOSIS — I10 Essential (primary) hypertension: Secondary | ICD-10-CM | POA: Diagnosis not present

## 2018-05-31 NOTE — Telephone Encounter (Signed)
Called the pt to inform him I received an notification from [employer] informing us that we would have to push his apt out. When they obtained authorization for the new insurance it was all processed and so 31 days -90 days after 6/17 is when he needs to be seen. I have called the patient and there was no answer. I LVM for the pt to call back to discuss.

## 2018-05-31 NOTE — Telephone Encounter (Signed)
Called the pt and explained everything to him about the insurance requirements and how per [employer] because they didn't obtain reauthorization for the equipment he would have to push his apt out. Pt verbalized understanding. We scheduled follow up on July 24,2019 at 8:30 am. Cancelled the patients apt for tomorrow since we are having to push his out.

## 2018-05-31 NOTE — Telephone Encounter (Signed)
Pt returned RN's call. He has an appt this afternoon and will be at home until 1:30. He may be back home around 4 or 4:30 today.

## 2018-06-01 ENCOUNTER — Encounter: Payer: Self-pay | Admitting: Physician Assistant

## 2018-06-01 ENCOUNTER — Ambulatory Visit: Payer: Federal, State, Local not specified - PPO | Admitting: Neurology

## 2018-06-01 DIAGNOSIS — I739 Peripheral vascular disease, unspecified: Secondary | ICD-10-CM

## 2018-06-01 HISTORY — DX: Peripheral vascular disease, unspecified: I73.9

## 2018-06-03 ENCOUNTER — Telehealth: Payer: Self-pay | Admitting: *Deleted

## 2018-06-03 NOTE — Telephone Encounter (Signed)
-----   Message from Liliane Shi, [place] sent at 06/01/2018  4:42 PM EDT ----- Please call the patient. The lower extremity arterial Doppler study demonstrates normal blood flow in both legs.  Therefore, the iliac stenosis noted on his abdominal aortic aneurysm ultrasound study is not significant.  Follow-up with primary care or orthopedic surgeon for knee pain. Please fax a copy to PCP:  Carollee Herter, Alferd Apa, DO  [name], PA-C    06/01/2018 4:39 PM

## 2018-06-03 NOTE — Telephone Encounter (Signed)
Left message to go over LEA results and recommendations.

## 2018-06-03 NOTE — Telephone Encounter (Signed)
Tried to reach pt x 2 to go over LEA results. I left message results have been sent to [place] for pt review. If any questions please call [phone].

## 2018-06-11 DIAGNOSIS — M25562 Pain in left knee: Secondary | ICD-10-CM | POA: Diagnosis not present

## 2018-07-05 ENCOUNTER — Encounter: Payer: Self-pay | Admitting: Neurology

## 2018-07-06 ENCOUNTER — Ambulatory Visit (INDEPENDENT_AMBULATORY_CARE_PROVIDER_SITE_OTHER): Payer: Medicare Other | Admitting: Neurology

## 2018-07-06 ENCOUNTER — Encounter: Payer: Self-pay | Admitting: Neurology

## 2018-07-06 VITALS — BP 124/88 | HR 64 | Ht 75.5 in | Wt 184.0 lb

## 2018-07-06 DIAGNOSIS — I2510 Atherosclerotic heart disease of native coronary artery without angina pectoris: Secondary | ICD-10-CM

## 2018-07-06 DIAGNOSIS — F959 Tic disorder, unspecified: Secondary | ICD-10-CM

## 2018-07-06 DIAGNOSIS — Z9989 Dependence on other enabling machines and devices: Secondary | ICD-10-CM | POA: Diagnosis not present

## 2018-07-06 DIAGNOSIS — G4733 Obstructive sleep apnea (adult) (pediatric): Secondary | ICD-10-CM

## 2018-07-06 DIAGNOSIS — G4730 Sleep apnea, unspecified: Secondary | ICD-10-CM

## 2018-07-06 DIAGNOSIS — G4700 Insomnia, unspecified: Secondary | ICD-10-CM

## 2018-07-06 DIAGNOSIS — F5104 Psychophysiologic insomnia: Secondary | ICD-10-CM | POA: Diagnosis not present

## 2018-07-06 MED ORDER — CLONAZEPAM 0.5 MG PO TABS: 0.5000 mg | ORAL_TABLET | Freq: Two times a day (BID) | ORAL | 1 refills | Status: DC

## 2018-07-06 MED ORDER — MIRTAZAPINE 45 MG PO TABS: 45.0000 mg | ORAL_TABLET | Freq: Every day | ORAL | 3 refills | Status: DC

## 2018-07-06 MED ORDER — GABAPENTIN 100 MG PO CAPS: ORAL_CAPSULE | ORAL | 2 refills | Status: DC
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
## 2018-07-06 NOTE — Patient Instructions (Signed)
Mirtazapine tablets What is this medicine? MIRTAZAPINE (mir TAZ a peen) is used to treat depression. This medicine may be used for other purposes; ask your health care provider or pharmacist if you have questions. COMMON BRAND NAME(S): Remeron What should I tell my health care provider before I take this medicine? They need to know if you have any of these conditions: -bipolar disorder -glaucoma -kidney disease -liver disease -suicidal thoughts -an unusual or allergic reaction to mirtazapine, other medicines, foods, dyes, or preservatives -pregnant or trying to get pregnant -breast-feeding How should I use this medicine? Take this medicine by mouth with a glass of water. Follow the directions on the prescription label. Take your medicine at regular intervals. Do not take your medicine more often than directed. Do not stop taking this medicine suddenly except upon the advice of your doctor. Stopping this medicine too quickly may cause serious side effects or your condition may worsen. A special MedGuide will be given to you by the pharmacist with each prescription and refill. Be sure to read this information carefully each time. Talk to your pediatrician regarding the use of this medicine in children. Special care may be needed. Overdosage: If you think you have taken too much of this medicine contact a poison control center or emergency room at once. NOTE: This medicine is only for you. Do not share this medicine with others. What if I miss a dose? If you miss a dose, take it as soon as you can. If it is almost time for your next dose, take only that dose. Do not take double or extra doses. What may interact with this medicine? Do not take this medicine with any of the following medications: -linezolid -MAOIs like Carbex, Eldepryl, Marplan, Nardil, and Parnate -methylene blue (injected into a vein) This medicine may also interact with the following medications: -alcohol -antiviral  medicines for HIV or AIDS -certain medicines that treat or prevent blood clots like warfarin -certain medicines for depression, anxiety, or psychotic disturbances -certain medicines for fungal infections like ketoconazole and itraconazole -certain medicines for migraine headache like almotriptan, eletriptan, frovatriptan, naratriptan, rizatriptan, sumatriptan, zolmitriptan -certain medicines for seizures like carbamazepine or phenytoin -certain medicines for sleep -cimetidine -erythromycin -fentanyl -lithium -medicines for blood pressure -nefazodone -rasagiline -rifampin -supplements like St. John's wort, kava kava, valerian -tramadol -tryptophan This list may not describe all possible interactions. Give your health care provider a list of all the medicines, herbs, non-prescription drugs, or dietary supplements you use. Also tell them if you smoke, drink alcohol, or use illegal drugs. Some items may interact with your medicine. What should I watch for while using this medicine? Tell your doctor if your symptoms do not get better or if they get worse. Visit your doctor or health care professional for regular checks on your progress. Because it may take several weeks to see the full effects of this medicine, it is important to continue your treatment as prescribed by your doctor. Patients and their families should watch out for new or worsening thoughts of suicide or depression. Also watch out for sudden changes in feelings such as feeling anxious, agitated, panicky, irritable, hostile, aggressive, impulsive, severely restless, overly excited and hyperactive, or not being able to sleep. If this happens, especially at the beginning of treatment or after a change in dose, call your health care professional. You may get drowsy or dizzy. Do not drive, use machinery, or do anything that needs mental alertness until you know how this medicine affects you. Do not   stand or sit up quickly, especially if  you are an older patient. This reduces the risk of dizzy or fainting spells. Alcohol may interfere with the effect of this medicine. Avoid alcoholic drinks. This medicine may cause dry eyes and blurred vision. If you wear contact lenses you may feel some discomfort. Lubricating drops may help. See your eye doctor if the problem does not go away or is severe. Your mouth may get dry. Chewing sugarless gum or sucking hard candy, and drinking plenty of water may help. Contact your doctor if the problem does not go away or is severe. What side effects may I notice from receiving this medicine? Side effects that you should report to your doctor or health care professional as soon as possible: -allergic reactions like skin rash, itching or hives, swelling of the face, lips, or tongue -anxious -changes in vision -chest pain -confusion -elevated mood, decreased need for sleep, racing thoughts, impulsive behavior -eye pain -fast, irregular heartbeat -feeling faint or lightheaded, falls -feeling agitated, angry, or irritable -fever or chills, sore throat -hallucination, loss of contact with reality -loss of balance or coordination -mouth sores -redness, blistering, peeling or loosening of the skin, including inside the mouth -restlessness, pacing, inability to keep still -seizures -stiff muscles -suicidal thoughts or other mood changes -trouble passing urine or change in the amount of urine -trouble sleeping -unusual bleeding or bruising -unusually weak or tired -vomiting Side effects that usually do not require medical attention (report to your doctor or health care professional if they continue or are bothersome): -change in appetite -constipation -dizziness -dry mouth -muscle aches or pains -nausea -tired -weight gain This list may not describe all possible side effects. Call your doctor for medical advice about side effects. You may report side effects to FDA at 1-800-FDA-1088. Where  should I keep my medicine? Keep out of the reach of children. Store at room temperature between 15 and 30 degrees C (59 and 86 degrees F) Protect from light and moisture. Throw away any unused medicine after the expiration date. NOTE: This sheet is a summary. It may not cover all possible information. If you have questions about this medicine, talk to your doctor, pharmacist, or health care provider.  2018 Elsevier/Gold Standard (2016-04-30 17:30:45)  

## 2018-07-11 DIAGNOSIS — H04123 Dry eye syndrome of bilateral lacrimal glands: Secondary | ICD-10-CM | POA: Diagnosis not present

## 2018-08-20 ENCOUNTER — Encounter: Payer: Self-pay | Admitting: Neurology

## 2018-08-22 ENCOUNTER — Other Ambulatory Visit: Payer: Self-pay | Admitting: Neurology

## 2018-08-22 DIAGNOSIS — G4700 Insomnia, unspecified: Secondary | ICD-10-CM

## 2018-08-22 DIAGNOSIS — Z9989 Dependence on other enabling machines and devices: Secondary | ICD-10-CM

## 2018-08-22 DIAGNOSIS — M25512 Pain in left shoulder: Secondary | ICD-10-CM | POA: Diagnosis not present

## 2018-08-22 DIAGNOSIS — F959 Tic disorder, unspecified: Secondary | ICD-10-CM

## 2018-08-22 DIAGNOSIS — M25562 Pain in left knee: Secondary | ICD-10-CM | POA: Diagnosis not present

## 2018-08-22 DIAGNOSIS — G4733 Obstructive sleep apnea (adult) (pediatric): Secondary | ICD-10-CM

## 2018-08-22 DIAGNOSIS — G4730 Sleep apnea, unspecified: Principal | ICD-10-CM

## 2018-08-22 MED ORDER — MIRTAZAPINE 45 MG PO TABS: 45.0000 mg | ORAL_TABLET | Freq: Every day | ORAL | 1 refills | Status: DC

## 2018-08-22 MED ORDER — TIZANIDINE HCL 4 MG PO TABS: ORAL_TABLET | ORAL | 1 refills | Status: DC

## 2018-08-22 MED ORDER — GABAPENTIN 100 MG PO CAPS: ORAL_CAPSULE | ORAL | 1 refills | Status: DC

## 2018-08-22 MED ORDER — CLONAZEPAM 0.5 MG PO TABS: 0.5000 mg | ORAL_TABLET | Freq: Two times a day (BID) | ORAL | 1 refills | Status: DC

## 2018-08-29 ENCOUNTER — Other Ambulatory Visit: Payer: Medicare Other | Admitting: *Deleted

## 2018-08-29 DIAGNOSIS — E785 Hyperlipidemia, unspecified: Secondary | ICD-10-CM

## 2018-08-29 LAB — HEPATIC FUNCTION PANEL
ALT: 28 IU/L (ref 0–44)
AST: 20 IU/L (ref 0–40)
Albumin: 4.6 g/dL (ref 3.6–4.8)
Alkaline Phosphatase: 65 IU/L (ref 39–117)
Bilirubin Total: <0.2 mg/dL (ref 0.0–1.2)
Bilirubin, Direct: 0.08 mg/dL (ref 0.00–0.40)
Total Protein: 6.8 g/dL (ref 6.0–8.5)

## 2018-08-29 LAB — LIPID PANEL
Chol/HDL Ratio: 4 ratio (ref 0.0–5.0)
Cholesterol, Total: 241 mg/dL — ABNORMAL HIGH (ref 100–199)
HDL: 61 mg/dL (ref >39)
LDL Calculated: 160 mg/dL — ABNORMAL HIGH (ref 0–99)
Triglycerides: 100 mg/dL (ref 0–149)
VLDL Cholesterol Cal: 20 mg/dL (ref 5–40)

## 2018-08-31 ENCOUNTER — Telehealth: Payer: Self-pay | Admitting: *Deleted

## 2018-08-31 NOTE — Telephone Encounter (Signed)
-----   Message from Sarina Ill, RN sent at 08/31/2018  9:00 AM EDT -----   ----- Message ----- From: Liliane Shi, PA-C Sent: 08/30/2018   5:05 PM EDT To: [facility] Triage  The following abnormalities are noted:  LDL is too high.   The LFTs are normal.  All other values are normal, stable or within acceptable limits. Medication changes / Follow up labs / Other changes or recommendations:    - Refer to [facility] for alternative therapy for his cholesterol.  [name], PA-C 08/30/2018 5:03 PM

## 2018-08-31 NOTE — Telephone Encounter (Signed)
Left message to go over lab results and recommendations.  

## 2018-09-01 NOTE — Telephone Encounter (Signed)
Left message x 2 to go over labs and recommendations.

## 2018-09-01 NOTE — Telephone Encounter (Signed)
Pt called earlier today though I was not available. I returned call and left message to call back to go over results.

## 2018-09-01 NOTE — Telephone Encounter (Signed)
Follow up ° ° °Patient is returning your call about lab results. °

## 2018-09-01 NOTE — Telephone Encounter (Signed)
-----   Message from Sarina Ill, RN sent at 08/31/2018  9:00 AM EDT -----   ----- Message ----- From: Liliane Shi, PA-C Sent: 08/30/2018   5:05 PM EDT To: [facility] Triage  The following abnormalities are noted:  LDL is too high.   The LFTs are normal.  All other values are normal, stable or within acceptable limits. Medication changes / Follow up labs / Other changes or recommendations:    - Refer to [facility] for alternative therapy for his cholesterol.  [name], PA-C 08/30/2018 5:03 PM

## 2018-09-02 ENCOUNTER — Ambulatory Visit (INDEPENDENT_AMBULATORY_CARE_PROVIDER_SITE_OTHER): Payer: Medicare Other | Admitting: Physician Assistant

## 2018-09-02 ENCOUNTER — Encounter: Payer: Self-pay | Admitting: Physician Assistant

## 2018-09-02 ENCOUNTER — Encounter: Payer: Self-pay | Admitting: *Deleted

## 2018-09-02 VITALS — BP 148/88 | HR 68 | Ht 75.5 in | Wt 186.0 lb

## 2018-09-02 DIAGNOSIS — I10 Essential (primary) hypertension: Secondary | ICD-10-CM

## 2018-09-02 DIAGNOSIS — R0789 Other chest pain: Secondary | ICD-10-CM

## 2018-09-02 DIAGNOSIS — R55 Syncope and collapse: Secondary | ICD-10-CM | POA: Diagnosis not present

## 2018-09-02 DIAGNOSIS — I2510 Atherosclerotic heart disease of native coronary artery without angina pectoris: Secondary | ICD-10-CM

## 2018-09-02 DIAGNOSIS — E785 Hyperlipidemia, unspecified: Secondary | ICD-10-CM

## 2018-09-02 MED ORDER — ATORVASTATIN CALCIUM 40 MG PO TABS: 40.0000 mg | ORAL_TABLET | Freq: Every day | ORAL | 3 refills | Status: DC

## 2018-09-02 MED ORDER — RAMIPRIL 5 MG PO CAPS: 5.0000 mg | ORAL_CAPSULE | Freq: Every day | ORAL | 3 refills | Status: DC

## 2018-09-02 NOTE — Patient Instructions (Addendum)
Medication Instructions:  1. START RAMIPRIL 5 MG DAILY; RX HAS BEEN SENT IN   2. START LIPITOR 40 MG DAILY; RX HAS BEEN SENT IN   Labwork: 1. BMET TO BE DONE IN 1 WEEK  2. FASTING LIPID AND LIVER PANEL TO BE DONE IN 12 WEEKS   Testing/Procedures: 1. .Your physician has requested that you have an echocardiogram. Echocardiography is a painless test that uses sound waves to create images of your heart. It provides your doctor with information about the size and shape of your heart and how well your heart's chambers and valves are working. This procedure takes approximately one hour. There are no restrictions for this procedure.  2. Your physician has recommended that you wear an event monitor. Event monitors are medical devices that record the heart's electrical activity. Doctors most often [place] these monitors to diagnose arrhythmias. Arrhythmias are problems with the speed or rhythm of the heartbeat. The monitor is a small, portable device. You can wear one while you do your normal daily activities. This is usually used to diagnose what is causing palpitations/syncope (passing out).  3. Your physician has requested that you have an exercise stress myoview. For further information please visit [email or web address]. Please follow instruction sheet, as given.   Follow-Up: [facility] IN 3 MONTHS  Any Other Special Instructions Will Be Listed Below (If Applicable).  NO DRIVING FOR 6 MONTHS FROM TIME OF LAST EPISODE OF SYNCOPE (PASSING OUT)   If you need a refill on your cardiac medications before your next appointment, please call your pharmacy.

## 2018-09-02 NOTE — Telephone Encounter (Signed)
Pt has appt today with Brynda Rim. PA. Results to be reviewed at appt.
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
## 2018-09-02 NOTE — Telephone Encounter (Signed)
-----   Message from Sarina Ill, RN sent at 08/31/2018  9:00 AM EDT -----   ----- Message ----- From: Liliane Shi, PA-C Sent: 08/30/2018   5:05 PM EDT To: [facility] Triage  The following abnormalities are noted:  LDL is too high.   The LFTs are normal.  All other values are normal, stable or within acceptable limits. Medication changes / Follow up labs / Other changes or recommendations:    - Refer to [facility] for alternative therapy for his cholesterol.  [name], PA-C 08/30/2018 5:03 PM

## 2018-09-06 ENCOUNTER — Other Ambulatory Visit: Payer: Self-pay | Admitting: *Deleted

## 2018-09-06 DIAGNOSIS — I714 Abdominal aortic aneurysm, without rupture, unspecified: Secondary | ICD-10-CM

## 2018-09-09 ENCOUNTER — Other Ambulatory Visit: Payer: Medicare Other | Admitting: *Deleted

## 2018-09-09 DIAGNOSIS — R55 Syncope and collapse: Secondary | ICD-10-CM

## 2018-09-09 LAB — BASIC METABOLIC PANEL
BUN/Creatinine Ratio: 16 (ref 10–24)
BUN: 17 mg/dL (ref 8–27)
CO2: 23 mmol/L (ref 20–29)
Calcium: 9.4 mg/dL (ref 8.6–10.2)
Chloride: 100 mmol/L (ref 96–106)
Creatinine, Ser: 1.04 mg/dL (ref 0.76–1.27)
GFR calc Af Amer: 86 mL/min/{1.73_m2} (ref >59)
GFR calc non Af Amer: 74 mL/min/{1.73_m2} (ref >59)
Glucose: 136 mg/dL — ABNORMAL HIGH (ref 65–99)
Potassium: 3.8 mmol/L (ref 3.5–5.2)
Sodium: 139 mmol/L (ref 134–144)

## 2018-09-19 ENCOUNTER — Telehealth (HOSPITAL_COMMUNITY): Payer: Self-pay | Admitting: Radiology

## 2018-09-19 NOTE — Telephone Encounter (Signed)
Patient given detailed instructions per Myocardial Perfusion Study Information Sheet for the test on 09/28/2018 at 10:15. Patient notified to arrive 15 minutes early and that it is imperative to arrive on time for appointment to keep from having the test rescheduled.  If you need to cancel or reschedule your appointment, please call the office within 24 hours of your appointment. . Patient verbalized understanding.EHK

## 2018-09-20 ENCOUNTER — Telehealth: Payer: Self-pay | Admitting: *Deleted

## 2018-09-20 NOTE — Telephone Encounter (Signed)
Pt has been notified of lab results today by phone with verbal understanding. Pt aware to call Dr. Etter Sjogren office to schedule lab work per Dr. Etter Sjogren for CMP, Hgb A1c. Pt is agreeable to plan of care and thanked me for the call.

## 2018-09-20 NOTE — Telephone Encounter (Signed)
-----   Message from Liliane Shi, [place] sent at 09/09/2018  4:28 PM EDT ----- The following abnormalities are noted:  Glucose (sugar) is elevated. Kidney function (Creatinine) and potassium are normal.  All other values are normal, stable or within acceptable limits. Medication changes / Follow up labs / Other changes or recommendations:    - Follow up with PCP for elevated blood sugar.  - Otherwise, Continue current medications and follow up as planned.  [name], PA-C 09/09/2018 4:26 PM

## 2018-09-21 ENCOUNTER — Telehealth: Payer: Self-pay | Admitting: *Deleted

## 2018-09-21 NOTE — Telephone Encounter (Signed)
Copied from [place] [id number]. Topic: General - Other >> Sep 20, 2018  2:21 PM Sheran Luz wrote: Reason for CRM:  Pt called stating that he was instructed by cardiologist to follow up with PCP but was not instructed on how long to wait before fu appointment/fu labs-or when Dr. Carollee Herter thinks he should come in. Pt is requesting a call back from office to discuss/schedule. Please advise.

## 2018-09-22 NOTE — Telephone Encounter (Signed)
[facility] requesting call back. Last OV in 10/2017 was instructed to follow-up in 6 months- overdue for visit.

## 2018-09-23 NOTE — Telephone Encounter (Signed)
Appointment made for 09/27/18

## 2018-09-26 ENCOUNTER — Other Ambulatory Visit (INDEPENDENT_AMBULATORY_CARE_PROVIDER_SITE_OTHER): Payer: Medicare Other

## 2018-09-26 DIAGNOSIS — R739 Hyperglycemia, unspecified: Secondary | ICD-10-CM

## 2018-09-26 LAB — COMPREHENSIVE METABOLIC PANEL
ALT: 16 U/L (ref 0–53)
AST: 13 U/L (ref 0–37)
Albumin: 4.6 g/dL (ref 3.5–5.2)
Alkaline Phosphatase: 63 U/L (ref 39–117)
BUN: 14 mg/dL (ref 6–23)
CO2: 33 mEq/L — ABNORMAL HIGH (ref 19–32)
Calcium: 9.8 mg/dL (ref 8.4–10.5)
Chloride: 102 mEq/L (ref 96–112)
Creatinine, Ser: 0.9 mg/dL (ref 0.40–1.50)
GFR: 89.56 mL/min (ref >60.00)
Glucose, Bld: 109 mg/dL — ABNORMAL HIGH (ref 70–99)
Potassium: 4.3 mEq/L (ref 3.5–5.1)
Sodium: 140 mEq/L (ref 135–145)
Total Bilirubin: 0.6 mg/dL (ref 0.2–1.2)
Total Protein: 7.1 g/dL (ref 6.0–8.3)

## 2018-09-26 LAB — HEMOGLOBIN A1C: Hgb A1c MFr Bld: 5.6 % (ref 4.6–6.5)

## 2018-09-26 NOTE — Addendum Note (Signed)
Addended by: Harl Bowie on: 09/26/2018 11:59 AM   Modules accepted: Orders

## 2018-09-27 ENCOUNTER — Encounter: Payer: Self-pay | Admitting: Family Medicine

## 2018-09-27 ENCOUNTER — Ambulatory Visit (INDEPENDENT_AMBULATORY_CARE_PROVIDER_SITE_OTHER): Payer: Medicare Other | Admitting: Family Medicine

## 2018-09-27 VITALS — BP 127/78 | HR 78 | Temp 98.0°F | Resp 16 | Ht 75.5 in | Wt 182.2 lb

## 2018-09-27 DIAGNOSIS — E785 Hyperlipidemia, unspecified: Secondary | ICD-10-CM | POA: Diagnosis not present

## 2018-09-27 DIAGNOSIS — I2510 Atherosclerotic heart disease of native coronary artery without angina pectoris: Secondary | ICD-10-CM

## 2018-09-27 DIAGNOSIS — R739 Hyperglycemia, unspecified: Secondary | ICD-10-CM

## 2018-09-27 DIAGNOSIS — Z23 Encounter for immunization: Secondary | ICD-10-CM

## 2018-09-27 NOTE — Patient Instructions (Signed)
Hyperglycemia Hyperglycemia occurs when the level of sugar (glucose) in the blood is too high. Glucose is a type of sugar that provides the body's main source of energy. Certain hormones (insulin and glucagon) control the level of glucose in the blood. Insulin lowers blood glucose, and glucagon increases blood glucose. Hyperglycemia can result from having too little insulin in the bloodstream, or from the body not responding normally to insulin. Hyperglycemia occurs most often in people who have diabetes (diabetes mellitus), but it can happen in people who do not have diabetes. It can develop quickly, and it can be life-threatening if it causes you to become severely dehydrated (diabetic ketoacidosis or hyperglycemic hyperosmolar state). Severe hyperglycemia is a medical emergency. What are the causes? If you have diabetes, hyperglycemia may be caused by:  Diabetes medicine.  Medicines that increase blood glucose or affect your diabetes control.  Not eating enough, or not eating often enough.  Changes in physical activity level.  Being sick or having an infection.  If you have prediabetes or undiagnosed diabetes:  Hyperglycemia may be caused by those conditions.  If you do not have diabetes, hyperglycemia may be caused by:  Certain medicines, including steroid medicines, beta-blockers, epinephrine, and thiazide diuretics.  Stress.  Serious illness.  Surgery.  Diseases of the pancreas.  Infection.  What increases the risk? Hyperglycemia is more likely to develop in people who have risk factors for diabetes, such as:  Having a family member with diabetes.  Having a gene for type 1 diabetes that is passed from parent to child (inherited).  Living in an area with cold weather conditions.  Exposure to certain viruses.  Certain conditions in which the body's disease-fighting (immune) system attacks itself (autoimmune disorders).  Being overweight or obese.  Having an  inactive (sedentary) lifestyle.  Having been diagnosed with insulin resistance.  Having a history of prediabetes, gestational diabetes, or polycystic ovarian syndrome (PCOS).  Being of American-Indian, African-American, Hispanic/Latino, or Asian/Pacific Islander descent.  What are the signs or symptoms? Hyperglycemia may not cause any symptoms. If you do have symptoms, they may include early warning signs, such as:  Increased thirst.  Hunger.  Feeling very tired.  Needing to urinate more often than usual.  Blurry vision.  Other symptoms may develop if hyperglycemia gets worse, such as:  Dry mouth.  Loss of appetite.  Fruity-smelling breath.  Weakness.  Unexpected or rapid weight gain or weight loss.  Tingling or numbness in the hands or feet.  Headache.  Skin that does not quickly return to normal after being lightly pinched and released (poor skin turgor).  Abdominal pain.  Cuts or bruises that are slow to heal.  How is this diagnosed? Hyperglycemia is diagnosed with a blood test to measure your blood glucose level. This blood test is usually done while you are having symptoms. Your health care provider may also do a physical exam and review your medical history. You may have more tests to determine the cause of your hyperglycemia, such as:  A fasting blood glucose (FBG) test. You will not be allowed to eat (you will fast) for at least 8 hours before a blood sample is taken.  An A1c (hemoglobin A1c) blood test. This provides information about blood glucose control over the previous 2-3 months.  An oral glucose tolerance test (OGTT). This measures your blood glucose at two times: ? After fasting. This is your baseline blood glucose level. ? Two hours after drinking a beverage that contains glucose.  How is   this treated? Treatment depends on the cause of your hyperglycemia. Treatment may include:  Taking medicine to regulate your blood glucose levels. If you  take insulin or other diabetes medicines, your medicine or dosage may be adjusted.  Lifestyle changes, such as exercising more, eating healthier foods, or losing weight.  Treating an illness or infection, if this caused your hyperglycemia.  Checking your blood glucose more often.  Stopping or reducing steroid medicines, if these caused your hyperglycemia.  If your hyperglycemia becomes severe and it results in hyperglycemic hyperosmolar state, you must be hospitalized and given IV fluids. Follow these instructions at home: General instructions  Take over-the-counter and prescription medicines only as told by your health care provider.  Do not use any products that contain nicotine or tobacco, such as cigarettes and e-cigarettes. If you need help quitting, ask your health care provider.  Limit alcohol intake to no more than 1 drink per day for nonpregnant women and 2 drinks per day for men. One drink equals 12 oz of beer, 5 oz of wine, or 1 oz of hard liquor.  Learn to manage stress. If you need help with this, ask your health care provider.  Keep all follow-up visits as told by your health care provider. This is important. Eating and drinking  Maintain a healthy weight.  Exercise regularly, as directed by your health care provider.  Stay hydrated, especially when you exercise, get sick, or spend time in hot temperatures.  Eat healthy foods, such as: ? Lean proteins. ? Complex carbohydrates. ? Fresh fruits and vegetables. ? Low-fat dairy products. ? Healthy fats.  Drink enough fluid to keep your urine clear or pale yellow. If you have diabetes:   Make sure you know the symptoms of hyperglycemia.  Follow your diabetes management plan, as told by your health care provider. Make sure you: ? Take your insulin and medicines as directed. ? Follow your exercise plan. ? Follow your meal plan. Eat on time, and do not skip meals. ? Check your blood glucose as often as directed.  Make sure to check your blood glucose before and after exercise. If you exercise longer or in a different way than usual, check your blood glucose more often. ? Follow your sick day plan whenever you cannot eat or drink normally. Make this plan in advance with your health care provider.  Share your diabetes management plan with people in your workplace, school, and household.  Check your urine for ketones when you are ill and as told by your health care provider.  Carry a medical alert card or wear medical alert jewelry. Contact a health care provider if:  Your blood glucose is at or above 240 mg/dL (13.3 mmol/L) for 2 days in a row.  You have problems keeping your blood glucose in your target range.  You have frequent episodes of hyperglycemia. Get help right away if:  You have difficulty breathing.  You have a change in how you think, feel, or act (mental status).  You have nausea or vomiting that does not go away. These symptoms may represent a serious problem that is an emergency. Do not wait to see if the symptoms will go away. Get medical help right away. Call your local emergency services (911 in the [place] Do not drive yourself to the hospital. Summary  Hyperglycemia occurs when the level of sugar (glucose) in the blood is too high.  Hyperglycemia is diagnosed with a blood test to measure your blood glucose level. This blood   test is usually done while you are having symptoms. Your health care provider may also do a physical exam and review your medical history.  If you have diabetes, follow your diabetes management plan as told by your health care provider.  Contact your health care provider if you have problems keeping your blood glucose in your target range. This information is not intended to replace advice given to you by your health care provider. Make sure you discuss any questions you have with your health care provider. Document Released: 05/26/2001 Document Revised:  08/17/2016 Document Reviewed: 08/17/2016 Elsevier Interactive Patient Education  2018 [employer]  
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
## 2018-09-28 ENCOUNTER — Other Ambulatory Visit: Payer: Self-pay

## 2018-09-28 ENCOUNTER — Ambulatory Visit (INDEPENDENT_AMBULATORY_CARE_PROVIDER_SITE_OTHER): Payer: Medicare Other

## 2018-09-28 ENCOUNTER — Telehealth: Payer: Self-pay | Admitting: *Deleted

## 2018-09-28 ENCOUNTER — Ambulatory Visit (HOSPITAL_BASED_OUTPATIENT_CLINIC_OR_DEPARTMENT_OTHER): Payer: Medicare Other

## 2018-09-28 ENCOUNTER — Ambulatory Visit (HOSPITAL_COMMUNITY): Payer: Medicare Other | Attending: Physician Assistant

## 2018-09-28 ENCOUNTER — Encounter: Payer: Self-pay | Admitting: Physician Assistant

## 2018-09-28 DIAGNOSIS — R55 Syncope and collapse: Secondary | ICD-10-CM

## 2018-09-28 DIAGNOSIS — R0789 Other chest pain: Secondary | ICD-10-CM | POA: Insufficient documentation

## 2018-09-28 LAB — MYOCARDIAL PERFUSION IMAGING
Estimated workload: 9.7 METS
Exercise duration (min): 7 min
Exercise duration (sec): 46 s
LV dias vol: 136 mL (ref 62–150)
LV sys vol: 53 mL
MPHR: 154 {beats}/min
Peak HR: 141 {beats}/min
Percent HR: 91 %
Rest HR: 61 {beats}/min
SDS: 1
SRS: 0
SSS: 1
TID: 0.79

## 2018-09-28 LAB — ECHOCARDIOGRAM COMPLETE
Height: 75.5 in
Weight: 2976 oz

## 2018-09-28 MED ORDER — TECHNETIUM TC 99M TETROFOSMIN IV KIT
32.3000 | PACK | Freq: Once | INTRAVENOUS | Status: AC | PRN
  Administered 2018-09-28: 32.3 via INTRAVENOUS
  Filled 2018-09-28: qty 33

## 2018-09-28 MED ORDER — TECHNETIUM TC 99M TETROFOSMIN IV KIT
10.7000 | PACK | Freq: Once | INTRAVENOUS | Status: AC | PRN
  Administered 2018-09-28: 10.7 via INTRAVENOUS
  Filled 2018-09-28: qty 11

## 2018-09-28 NOTE — Telephone Encounter (Signed)
Pt has been notified of Myoview results by phone with verbal understanding. Pt thanked me for the call.  

## 2018-09-28 NOTE — Telephone Encounter (Signed)
-----   Message from Liliane Shi, [place] sent at 09/28/2018  5:19 PM EDT ----- The stress test demonstrates normal blood flow to the heart (no ischemia).  Heart function is normal (ejection fraction). Recommendations:  - Continue current medications and follow up as planned.   - Send copy to PCP.  [name], PA-C    09/28/2018 5:17 PM

## 2018-09-29 ENCOUNTER — Encounter: Payer: Self-pay | Admitting: Physician Assistant

## 2018-09-29 ENCOUNTER — Telehealth: Payer: Self-pay

## 2018-09-29 DIAGNOSIS — R739 Hyperglycemia, unspecified: Secondary | ICD-10-CM | POA: Insufficient documentation

## 2018-09-29 NOTE — Assessment & Plan Note (Signed)
Check labs  Watch simple sugars and starches  

## 2018-09-29 NOTE — Telephone Encounter (Signed)
Another message left for the pt and on his wife's cell phone.

## 2018-09-29 NOTE — Telephone Encounter (Signed)
Left message for pt to call us to see if he had symptoms re: his monitor strip sent to [place] this morning from 8:39am... 6 beat run of [place].Marland Kitchen Per Dr. Meda Coffee to continue to monitor.

## 2018-09-29 NOTE — Assessment & Plan Note (Signed)
Tolerating statin, encouraged heart healthy diet, avoid trans fats, minimize simple carbs and saturated fats. Increase exercise as tolerated F/u cardiology

## 2018-10-03 ENCOUNTER — Telehealth: Payer: Self-pay

## 2018-10-03 NOTE — Addendum Note (Signed)
Addended by: Kem Boroughs D on: 10/03/2018 01:21 PM   Modules accepted: Orders

## 2018-10-03 NOTE — Telephone Encounter (Signed)
lpmtcb 10/21

## 2018-10-24 ENCOUNTER — Telehealth: Payer: Self-pay | Admitting: *Deleted

## 2018-10-24 NOTE — Telephone Encounter (Signed)
Received monitor report from [employer] of 5 beat run of V-tach which occurred 11/9 at 12:51 pm.  attempted to contact pt and call was sent directly to vm.  Please c/b to f/u. Report in Dr Darnelle Bos box if needed.

## 2018-10-25 NOTE — Telephone Encounter (Signed)
Follow up   Patient is returning call. He states that he is going to sleep and to leave a voicemail if he is not available.

## 2018-10-25 NOTE — Telephone Encounter (Signed)
Spoke with the pt and he reports that he was sleeping during the time of his 5 beat [place] run at 12:51pm on Saturday 10/22/18.Marland Kitchen He says that he had a very late night on 11/8.Marland Kitchen He denies any symptoms or remembers feeling anything the entire weekend that would indicate he was having arrhythmias.Marland Kitchen He was very upset about having to wear the monitor and feels it is time to take it off. He is just done with it as he reports. I advised him that if he feels the need to remove it 4 days early then I will document in there chart that ir is off but he agree to let me at least message [name] PA and let him make the decision if he feels he has enough info from the time that he has it on. Pt verbalized understanding and agrees and will wait for a call back after Nicki Reaper has a chance to review the message. Pt says to leave him a message on [phone] since he plans on going to sleep.

## 2018-10-25 NOTE — Telephone Encounter (Signed)
LMTCB on pt phone and wife's phone... Both went straight to voicemail

## 2018-10-25 NOTE — Telephone Encounter (Signed)
It would be ideal if he could wear it until the end.  But, if it is too much for him, he can stop it a couple days early. [name], PA-C    10/25/2018 5:21 PM

## 2018-10-25 NOTE — Telephone Encounter (Signed)
LM for pt that we have not heard back form [name] Pa since he is out of the office today as we talked about earlier today.. Will call him back after I hear back re: his recommendation on taking his monitor off a couple of days early. [name]

## 2018-10-26 NOTE — Telephone Encounter (Signed)
Left message on pt Voicemail per his request with Trinidad Curet recommendations re: his monitor.

## 2018-11-03 ENCOUNTER — Telehealth: Payer: Self-pay | Admitting: Physician Assistant

## 2018-11-03 NOTE — Telephone Encounter (Signed)
New message:   Patient calling to the let the doctor know that [facility] will be [facility] in December 29, 2018. Patient said to feel free to leave a message on his voice mail.

## 2018-11-07 ENCOUNTER — Encounter: Payer: Self-pay | Admitting: Physician Assistant

## 2018-11-16 ENCOUNTER — Ambulatory Visit: Payer: Medicare Other | Admitting: Physician Assistant

## 2018-11-17 ENCOUNTER — Ambulatory Visit: Payer: Medicare Other | Admitting: Physician Assistant

## 2018-11-25 ENCOUNTER — Other Ambulatory Visit: Payer: Medicare Other

## 2018-12-20 ENCOUNTER — Encounter: Payer: Self-pay | Admitting: Physician Assistant

## 2018-12-29 ENCOUNTER — Other Ambulatory Visit (INDEPENDENT_AMBULATORY_CARE_PROVIDER_SITE_OTHER): Payer: Medicare Other

## 2018-12-29 ENCOUNTER — Other Ambulatory Visit: Payer: Self-pay | Admitting: Family Medicine

## 2018-12-29 DIAGNOSIS — R739 Hyperglycemia, unspecified: Secondary | ICD-10-CM | POA: Diagnosis not present

## 2018-12-29 DIAGNOSIS — E875 Hyperkalemia: Secondary | ICD-10-CM

## 2018-12-29 LAB — COMPREHENSIVE METABOLIC PANEL
ALT: 38 U/L (ref 0–53)
AST: 27 U/L (ref 0–37)
Albumin: 4.7 g/dL (ref 3.5–5.2)
Alkaline Phosphatase: 61 U/L (ref 39–117)
BUN: 23 mg/dL (ref 6–23)
CO2: 31 mEq/L (ref 19–32)
Calcium: 10 mg/dL (ref 8.4–10.5)
Chloride: 102 mEq/L (ref 96–112)
Creatinine, Ser: 1.12 mg/dL (ref 0.40–1.50)
GFR: 69.53 mL/min (ref >60.00)
Glucose, Bld: 99 mg/dL (ref 70–99)
Potassium: 5.3 mEq/L — ABNORMAL HIGH (ref 3.5–5.1)
Sodium: 139 mEq/L (ref 135–145)
Total Bilirubin: 0.4 mg/dL (ref 0.2–1.2)
Total Protein: 7.1 g/dL (ref 6.0–8.3)

## 2018-12-29 LAB — HEMOGLOBIN A1C: Hgb A1c MFr Bld: 5.6 % (ref 4.6–6.5)

## 2019-01-02 ENCOUNTER — Other Ambulatory Visit: Payer: Medicare Other

## 2019-01-03 ENCOUNTER — Other Ambulatory Visit (INDEPENDENT_AMBULATORY_CARE_PROVIDER_SITE_OTHER): Payer: Medicare Other

## 2019-01-03 DIAGNOSIS — E785 Hyperlipidemia, unspecified: Secondary | ICD-10-CM

## 2019-01-03 DIAGNOSIS — E875 Hyperkalemia: Secondary | ICD-10-CM | POA: Diagnosis not present

## 2019-01-03 LAB — BASIC METABOLIC PANEL
BUN: 16 mg/dL (ref 6–23)
CO2: 27 mEq/L (ref 19–32)
Calcium: 9.6 mg/dL (ref 8.4–10.5)
Chloride: 102 mEq/L (ref 96–112)
Creatinine, Ser: 0.89 mg/dL (ref 0.40–1.50)
GFR: 85.28 mL/min (ref >60.00)
Glucose, Bld: 102 mg/dL — ABNORMAL HIGH (ref 70–99)
Potassium: 3.8 mEq/L (ref 3.5–5.1)
Sodium: 138 mEq/L (ref 135–145)

## 2019-01-03 LAB — LIPID PANEL
Cholesterol: 151 mg/dL (ref 0–200)
HDL: 47.1 mg/dL (ref >39.00)
LDL Cholesterol: 83 mg/dL (ref 0–99)
NonHDL: 104.29
Total CHOL/HDL Ratio: 3
Triglycerides: 104 mg/dL (ref 0.0–149.0)
VLDL: 20.8 mg/dL (ref 0.0–40.0)

## 2019-01-03 NOTE — Addendum Note (Signed)
Addended by: Caffie Pinto on: 01/03/2019 08:04 AM   Modules accepted: Orders

## 2019-01-04 ENCOUNTER — Encounter: Payer: Self-pay | Admitting: Neurology

## 2019-01-09 ENCOUNTER — Encounter: Payer: Self-pay | Admitting: Neurology

## 2019-01-09 ENCOUNTER — Ambulatory Visit (INDEPENDENT_AMBULATORY_CARE_PROVIDER_SITE_OTHER): Payer: Medicare Other | Admitting: Neurology

## 2019-01-09 DIAGNOSIS — Z9989 Dependence on other enabling machines and devices: Secondary | ICD-10-CM

## 2019-01-09 DIAGNOSIS — G4733 Obstructive sleep apnea (adult) (pediatric): Secondary | ICD-10-CM | POA: Diagnosis not present

## 2019-01-09 DIAGNOSIS — I739 Peripheral vascular disease, unspecified: Secondary | ICD-10-CM | POA: Diagnosis not present

## 2019-01-09 DIAGNOSIS — F959 Tic disorder, unspecified: Secondary | ICD-10-CM | POA: Diagnosis not present

## 2019-01-09 DIAGNOSIS — G4700 Insomnia, unspecified: Secondary | ICD-10-CM

## 2019-01-09 DIAGNOSIS — G4730 Sleep apnea, unspecified: Secondary | ICD-10-CM | POA: Diagnosis not present

## 2019-01-09 MED ORDER — CLONAZEPAM 1 MG PO TABS: 0.5000 mg | ORAL_TABLET | Freq: Two times a day (BID) | ORAL | 5 refills | Status: DC | PRN

## 2019-01-09 MED ORDER — MIRTAZAPINE 45 MG PO TABS: 45.0000 mg | ORAL_TABLET | Freq: Every day | ORAL | 1 refills | Status: DC

## 2019-01-09 MED ORDER — CLONAZEPAM 0.5 MG PO TABS: 0.5000 mg | ORAL_TABLET | Freq: Two times a day (BID) | ORAL | 1 refills | Status: DC
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
## 2019-01-09 NOTE — Addendum Note (Signed)
Addended by: Larey Seat on: 01/09/2019 02:31 PM   Modules accepted: Orders

## 2019-01-11 NOTE — Addendum Note (Signed)
Addended by: Larey Seat on: 01/11/2019 10:42 AM   Modules accepted: Orders

## 2019-01-24 DIAGNOSIS — M25512 Pain in left shoulder: Secondary | ICD-10-CM | POA: Diagnosis not present

## 2019-01-31 ENCOUNTER — Other Ambulatory Visit: Payer: Self-pay | Admitting: Neurology

## 2019-02-02 DIAGNOSIS — M25512 Pain in left shoulder: Secondary | ICD-10-CM | POA: Diagnosis not present

## 2019-02-07 DIAGNOSIS — M25512 Pain in left shoulder: Secondary | ICD-10-CM | POA: Diagnosis not present

## 2019-02-24 DIAGNOSIS — M25512 Pain in left shoulder: Secondary | ICD-10-CM | POA: Diagnosis not present

## 2019-03-02 DIAGNOSIS — M25512 Pain in left shoulder: Secondary | ICD-10-CM | POA: Diagnosis not present

## 2019-03-07 DIAGNOSIS — M25512 Pain in left shoulder: Secondary | ICD-10-CM | POA: Diagnosis not present

## 2019-03-09 ENCOUNTER — Other Ambulatory Visit: Payer: Self-pay | Admitting: Family Medicine

## 2019-03-09 ENCOUNTER — Encounter: Payer: Self-pay | Admitting: Family Medicine

## 2019-03-09 DIAGNOSIS — M25512 Pain in left shoulder: Secondary | ICD-10-CM | POA: Diagnosis not present

## 2019-03-09 MED ORDER — OFLOXACIN 0.3 % OT SOLN: 10.0000 [drp] | Freq: Every day | OTIC | 0 refills | Status: AC

## 2019-03-15 DIAGNOSIS — N401 Enlarged prostate with lower urinary tract symptoms: Secondary | ICD-10-CM | POA: Diagnosis not present

## 2019-03-20 DIAGNOSIS — R351 Nocturia: Secondary | ICD-10-CM | POA: Diagnosis not present

## 2019-03-20 DIAGNOSIS — R972 Elevated prostate specific antigen [PSA]: Secondary | ICD-10-CM | POA: Diagnosis not present

## 2019-03-20 DIAGNOSIS — N401 Enlarged prostate with lower urinary tract symptoms: Secondary | ICD-10-CM | POA: Diagnosis not present

## 2019-04-10 DIAGNOSIS — M25512 Pain in left shoulder: Secondary | ICD-10-CM | POA: Diagnosis not present

## 2019-04-17 DIAGNOSIS — M25512 Pain in left shoulder: Secondary | ICD-10-CM | POA: Diagnosis not present

## 2019-04-17 DIAGNOSIS — M545 Low back pain: Secondary | ICD-10-CM | POA: Diagnosis not present

## 2019-04-21 DIAGNOSIS — M5410 Radiculopathy, site unspecified: Secondary | ICD-10-CM | POA: Diagnosis not present

## 2019-04-26 DIAGNOSIS — S339XXA Sprain of unspecified parts of lumbar spine and pelvis, initial encounter: Secondary | ICD-10-CM | POA: Diagnosis not present

## 2019-04-26 DIAGNOSIS — M545 Low back pain: Secondary | ICD-10-CM | POA: Diagnosis not present

## 2019-04-26 DIAGNOSIS — M5410 Radiculopathy, site unspecified: Secondary | ICD-10-CM | POA: Diagnosis not present

## 2019-05-02 ENCOUNTER — Other Ambulatory Visit: Payer: Self-pay | Admitting: Family Medicine

## 2019-05-25 ENCOUNTER — Other Ambulatory Visit: Payer: Self-pay | Admitting: Family Medicine

## 2019-05-25 ENCOUNTER — Telehealth: Payer: Self-pay | Admitting: *Deleted

## 2019-05-25 DIAGNOSIS — M545 Low back pain: Secondary | ICD-10-CM | POA: Diagnosis not present

## 2019-05-25 NOTE — Telephone Encounter (Signed)
Lvm to contact clinic back for virtual appointment

## 2019-05-29 ENCOUNTER — Ambulatory Visit: Payer: Federal, State, Local not specified - PPO | Admitting: Physician Assistant

## 2019-05-30 ENCOUNTER — Ambulatory Visit: Payer: Federal, State, Local not specified - PPO | Admitting: Physician Assistant

## 2019-06-01 ENCOUNTER — Other Ambulatory Visit: Payer: Self-pay | Admitting: Family Medicine

## 2019-06-02 DIAGNOSIS — M545 Low back pain: Secondary | ICD-10-CM | POA: Diagnosis not present

## 2019-06-05 ENCOUNTER — Other Ambulatory Visit: Payer: Self-pay

## 2019-06-05 ENCOUNTER — Encounter: Payer: Self-pay | Admitting: Physician Assistant

## 2019-06-05 ENCOUNTER — Telehealth (INDEPENDENT_AMBULATORY_CARE_PROVIDER_SITE_OTHER): Payer: Medicare Other | Admitting: Physician Assistant

## 2019-06-05 ENCOUNTER — Telehealth: Payer: Self-pay

## 2019-06-05 VITALS — BP 130/85 | HR 68 | Ht 75.0 in | Wt 186.0 lb

## 2019-06-05 DIAGNOSIS — I2510 Atherosclerotic heart disease of native coronary artery without angina pectoris: Secondary | ICD-10-CM

## 2019-06-05 DIAGNOSIS — Z7189 Other specified counseling: Secondary | ICD-10-CM

## 2019-06-05 DIAGNOSIS — I739 Peripheral vascular disease, unspecified: Secondary | ICD-10-CM

## 2019-06-05 DIAGNOSIS — E785 Hyperlipidemia, unspecified: Secondary | ICD-10-CM

## 2019-06-05 DIAGNOSIS — I714 Abdominal aortic aneurysm, without rupture, unspecified: Secondary | ICD-10-CM

## 2019-06-05 DIAGNOSIS — I10 Essential (primary) hypertension: Secondary | ICD-10-CM
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
## 2019-06-05 NOTE — Telephone Encounter (Signed)
This is a duplicate note.  

## 2019-06-05 NOTE — Telephone Encounter (Signed)
Virtual Visit Pre-Appointment Phone Call  "(Name), I am calling you today to discuss your upcoming appointment. We are currently trying to limit exposure to the virus that causes COVID-19 by seeing patients at home rather than in the office."  1. "What is the BEST phone number to call the day of the visit?" - include this in appointment notes  2. "Do you have or have access to (through a family member/friend) a smartphone with video capability that we can use for your visit?" a. If yes - list this number in appt notes as "cell" (if different from BEST phone #) and list the appointment type as a VIDEO visit in appointment notes b. If no - list the appointment type as a PHONE visit in appointment notes  Confirm consent - "In the setting of the current Covid19 crisis, you are scheduled for a (phone or video) visit with your provider on (date) at (time).  Just as we do with many in-office visits, in order for you to participate in this visit, we must obtain consent.  If you'd like, I can send this to your mychart (if signed up) or email for you to review.  Otherwise, I can obtain your verbal consent now.  All virtual visits are billed to your insurance company just like a normal visit would be.  By agreeing to a virtual visit, we'd like you to understand that the technology does not allow for your provider to perform an examination, and thus may limit your provider's ability to fully assess your condition. If your provider identifies any concerns that need to be evaluated in person, we will make arrangements to do so.  Finally, though the technology is pretty good, we cannot assure that it will always work on either your or our end, and in the setting of a video visit, we may have to convert it to a phone-only visit.  In either situation, we cannot ensure that we have a secure connection.  Are you willing to proceed?" Yes 3. Advise patient to be prepared - "Two hours prior to your appointment, go ahead  and check your blood pressure, pulse, oxygen saturation, and your weight (if you have the equipment to check those) and write them all down. When your visit starts, your provider will ask you for this information. If you have an Apple Watch or Kardia device, please plan to have heart rate information ready on the day of your appointment. Please have a pen and paper handy nearby the day of the visit as well."  4. Give patient instructions for MyChart download to smartphone OR Doximity/Doxy.me as below if video visit (depending on what platform provider is using)  5. Inform patient they will receive a phone call 15 minutes prior to their appointment time (may be from unknown caller ID) so they should be prepared to answer    TELEPHONE CALL NOTE  [name] has been deemed a candidate for a follow-up tele-health visit to limit community exposure during the Covid-19 pandemic. I spoke with the patient via phone to ensure availability of phone/video source, confirm preferred email & phone number, and discuss instructions and expectations.  I reminded [name] to be prepared with any vital sign and/or heart rhythm information that could potentially be obtained via home monitoring, at the time of his visit. I reminded [name] to expect a phone call prior to his visit.  Jeremy Johann, [place] 06/05/2019 3:22 PM   INSTRUCTIONS FOR DOWNLOADING  THE MYCHART APP TO SMARTPHONE  - The patient must first make sure to have activated MyChart and know their login information - If Apple, go to [employer] and type in MyChart in the search bar and download the app. If Android, ask patient to go to [employer] and type in [place] in the search bar and download the app. The app is free but as with any other app downloads, their phone may require them to verify saved payment information or Apple/Android password.  - The patient will need to then log into the app with their MyChart username and password,  and select [facility] as their healthcare provider to link the account. When it is time for your visit, go to the MyChart app, find appointments, and click Begin Video Visit. Be sure to Select Allow for your device to access the Microphone and Camera for your visit. You will then be connected, and your provider will be with you shortly.  **If they have any issues connecting, or need assistance please contact MyChart service desk [phone])**  **If using a computer, in order to ensure the best quality for their visit they will need to use either of the following Internet Browsers: [employer], or Google Chrome**  IF USING DOXIMITY or DOXY.ME - The patient will receive a link just prior to their visit by text.     FULL LENGTH CONSENT FOR TELE-HEALTH VISIT   I hereby voluntarily request, consent and authorize [facility] and its employed or contracted physicians, physician assistants, nurse practitioners or other licensed health care professionals (the Practitioner), to provide me with telemedicine health care services (the "Services") as deemed necessary by the treating Practitioner. I acknowledge and consent to receive the Services by the Practitioner via telemedicine. I understand that the telemedicine visit will involve communicating with the Practitioner through live audiovisual communication technology and the disclosure of certain medical information by electronic transmission. I acknowledge that I have been given the opportunity to request an in-person assessment or other available alternative prior to the telemedicine visit and am voluntarily participating in the telemedicine visit.  I understand that I have the right to withhold or withdraw my consent to the use of telemedicine in the course of my care at any time, without affecting my right to future care or treatment, and that the Practitioner or I may terminate the telemedicine visit at any time. I understand that I  have the right to inspect all information obtained and/or recorded in the course of the telemedicine visit and may receive copies of available information for a reasonable fee.  I understand that some of the potential risks of receiving the Services via telemedicine include:  Marland Kitchen Delay or interruption in medical evaluation due to technological equipment failure or disruption; . Information transmitted may not be sufficient (e.g. poor resolution of images) to allow for appropriate medical decision making by the Practitioner; and/or  . In rare instances, security protocols could fail, causing a breach of personal health information.  Furthermore, I acknowledge that it is my responsibility to provide information about my medical history, conditions and care that is complete and accurate to the best of my ability. I acknowledge that Practitioner's advice, recommendations, and/or decision may be based on factors not within their control, such as incomplete or inaccurate data provided by me or distortions of diagnostic images or specimens that may result from electronic transmissions. I understand that the practice of medicine is not an exact science and that Practitioner  makes no warranties or guarantees regarding treatment outcomes. I acknowledge that I will receive a copy of this consent concurrently upon execution via email to the email address I last provided but may also request a printed copy by calling the office of [facility].    I understand that my insurance will be billed for this visit.   I have read or had this consent read to me. . I understand the contents of this consent, which adequately explains the benefits and risks of the Services being provided via telemedicine.  . I have been provided ample opportunity to ask questions regarding this consent and the Services and have had my questions answered to my satisfaction. . I give my informed consent for the services to be provided through the  use of telemedicine in my medical care  By participating in this telemedicine visit I agree to the above.

## 2019-06-05 NOTE — Patient Instructions (Signed)
Medication Instructions:  Your physician recommends that you continue on your current medications as directed. Please refer to the Current Medication list given to you today.  If you need a refill on your cardiac medications before your next appointment, please call your pharmacy.   Lab work: NONE ORDERED  TODAY '  If you have labs (blood work) drawn today and your tests are completely normal, you will receive your results only by: Marland Kitchen MyChart Message (if you have MyChart) OR . A paper copy in the mail If you have any lab test that is abnormal or we need to change your treatment, we will call you to review the results.  Testing/Procedures: Your physician has requested that you have an abdominal aorta duplex. During this test, an ultrasound is used to evaluate the aorta. Allow 30 minutes for this exam. Do not eat after midnight the day before and avoid carbonated beverages  Follow-Up: At [facility], you and your health needs are our priority.  As part of our continuing mission to provide you with exceptional heart care, we have created designated Provider Care Teams.  These Care Teams include your primary Cardiologist (physician) and Advanced Practice Providers (APPs -  Physician Assistants and Nurse Practitioners) who all work together to provide you with the care you need, when you need it. You will need a follow up appointment in:  6 months.  Please call our office 2 months in advance to schedule this appointment. You may seeScott Kathlen Mody, PA-C  Any Other Special Instructions Will Be Listed Below (If Applicable).

## 2019-06-14 ENCOUNTER — Other Ambulatory Visit: Payer: Self-pay | Admitting: Neurology

## 2019-07-10 ENCOUNTER — Encounter: Payer: Self-pay | Admitting: Neurology

## 2019-07-11 ENCOUNTER — Encounter: Payer: Self-pay | Admitting: Neurology

## 2019-07-11 ENCOUNTER — Ambulatory Visit (INDEPENDENT_AMBULATORY_CARE_PROVIDER_SITE_OTHER): Payer: Medicare Other | Admitting: Neurology

## 2019-07-11 ENCOUNTER — Other Ambulatory Visit: Payer: Self-pay

## 2019-07-11 DIAGNOSIS — G4733 Obstructive sleep apnea (adult) (pediatric): Secondary | ICD-10-CM | POA: Diagnosis not present

## 2019-07-11 DIAGNOSIS — G4730 Sleep apnea, unspecified: Secondary | ICD-10-CM | POA: Diagnosis not present

## 2019-07-11 DIAGNOSIS — F959 Tic disorder, unspecified: Secondary | ICD-10-CM

## 2019-07-11 DIAGNOSIS — I2510 Atherosclerotic heart disease of native coronary artery without angina pectoris: Secondary | ICD-10-CM | POA: Diagnosis not present

## 2019-07-11 DIAGNOSIS — G4700 Insomnia, unspecified: Secondary | ICD-10-CM | POA: Diagnosis not present

## 2019-07-11 DIAGNOSIS — Z9989 Dependence on other enabling machines and devices: Secondary | ICD-10-CM

## 2019-07-11 MED ORDER — GABAPENTIN 100 MG PO CAPS: ORAL_CAPSULE | ORAL | 1 refills | Status: DC

## 2019-07-11 MED ORDER — CLONAZEPAM 0.5 MG PO TABS: 0.5000 mg | ORAL_TABLET | Freq: Two times a day (BID) | ORAL | 0 refills | Status: DC

## 2019-07-11 MED ORDER — TIZANIDINE HCL 4 MG PO TABS: ORAL_TABLET | ORAL | 1 refills | Status: DC

## 2019-07-11 MED ORDER — MIRTAZAPINE 45 MG PO TABS: 45.0000 mg | ORAL_TABLET | Freq: Every day | ORAL | 1 refills | Status: DC

## 2019-07-11 NOTE — Patient Instructions (Signed)
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
## 2019-08-07 ENCOUNTER — Encounter: Payer: Self-pay | Admitting: Neurology

## 2019-08-07 ENCOUNTER — Other Ambulatory Visit: Payer: Self-pay | Admitting: Neurology

## 2019-08-08 ENCOUNTER — Other Ambulatory Visit: Payer: Self-pay | Admitting: Neurology

## 2019-08-09 MED ORDER — CLONAZEPAM 0.5 MG PO TABS: 0.5000 mg | ORAL_TABLET | Freq: Two times a day (BID) | ORAL | 1 refills | Status: DC | PRN

## 2019-08-22 ENCOUNTER — Ambulatory Visit (HOSPITAL_COMMUNITY)
Admission: RE | Admit: 2019-08-22 | Discharge: 2019-08-22 | Disposition: A | Discharge status: Home / Self Care | Payer: Medicare Other | Source: Ambulatory Visit | Attending: Cardiovascular Disease | Admitting: Cardiovascular Disease

## 2019-08-22 ENCOUNTER — Other Ambulatory Visit: Payer: Self-pay

## 2019-08-22 DIAGNOSIS — I714 Abdominal aortic aneurysm, without rupture, unspecified: Secondary | ICD-10-CM

## 2019-08-23 ENCOUNTER — Encounter: Payer: Self-pay | Admitting: Physician Assistant

## 2019-08-24 NOTE — Telephone Encounter (Signed)
Pt called following up on his Mychart Question In regares to his Medication, pt stated if he does not answer the phone please leave a message. He wants to know whether an additional medication was prescribed to him?

## 2019-08-28 DIAGNOSIS — M5136 Other intervertebral disc degeneration, lumbar region: Secondary | ICD-10-CM | POA: Diagnosis not present

## 2019-08-28 DIAGNOSIS — M5416 Radiculopathy, lumbar region: Secondary | ICD-10-CM | POA: Diagnosis not present

## 2019-08-28 DIAGNOSIS — M545 Low back pain: Secondary | ICD-10-CM | POA: Diagnosis not present

## 2019-08-28 DIAGNOSIS — M47816 Spondylosis without myelopathy or radiculopathy, lumbar region: Secondary | ICD-10-CM | POA: Diagnosis not present

## 2019-09-08 ENCOUNTER — Other Ambulatory Visit: Payer: Self-pay

## 2019-09-08 DIAGNOSIS — R6889 Other general symptoms and signs: Secondary | ICD-10-CM | POA: Diagnosis not present

## 2019-09-08 DIAGNOSIS — Z20822 Contact with and (suspected) exposure to covid-19: Secondary | ICD-10-CM

## 2019-09-09 LAB — NOVEL CORONAVIRUS, NAA: SARS-CoV-2, NAA: NOT DETECTED

## 2019-09-18 DIAGNOSIS — N5201 Erectile dysfunction due to arterial insufficiency: Secondary | ICD-10-CM | POA: Diagnosis not present

## 2019-09-18 DIAGNOSIS — R3912 Poor urinary stream: Secondary | ICD-10-CM | POA: Diagnosis not present

## 2019-09-18 DIAGNOSIS — N401 Enlarged prostate with lower urinary tract symptoms: Secondary | ICD-10-CM | POA: Diagnosis not present

## 2019-09-19 ENCOUNTER — Encounter: Payer: Self-pay | Admitting: *Deleted

## 2019-09-26 ENCOUNTER — Encounter: Payer: Self-pay | Admitting: Physician Assistant

## 2019-09-26 NOTE — Telephone Encounter (Signed)
°  No note needed at this time

## 2019-10-09 ENCOUNTER — Encounter: Payer: Self-pay | Admitting: Neurology

## 2019-10-09 ENCOUNTER — Other Ambulatory Visit: Payer: Self-pay

## 2019-10-09 MED ORDER — CLONAZEPAM 0.5 MG PO TABS: 0.5000 mg | ORAL_TABLET | Freq: Two times a day (BID) | ORAL | 1 refills | Status: DC | PRN

## 2019-10-20 ENCOUNTER — Other Ambulatory Visit: Payer: Self-pay | Admitting: Physician Assistant

## 2019-11-25 ENCOUNTER — Encounter: Payer: Self-pay | Admitting: Neurology

## 2019-11-27 ENCOUNTER — Other Ambulatory Visit: Payer: Self-pay | Admitting: Neurology

## 2019-11-27 MED ORDER — CLONAZEPAM 0.5 MG PO TABS: 0.5000 mg | ORAL_TABLET | Freq: Two times a day (BID) | ORAL | 5 refills | Status: DC | PRN

## 2019-12-18 ENCOUNTER — Other Ambulatory Visit: Payer: Self-pay

## 2019-12-18 MED ORDER — RAMIPRIL 5 MG PO CAPS: 5.0000 mg | ORAL_CAPSULE | Freq: Every day | ORAL | 1 refills | Status: DC

## 2020-01-01 MED ORDER — RAMIPRIL 5 MG PO CAPS: 5.0000 mg | ORAL_CAPSULE | Freq: Every day | ORAL | 2 refills | Status: DC

## 2020-01-22 ENCOUNTER — Other Ambulatory Visit: Payer: Self-pay | Admitting: Neurology

## 2020-01-22 DIAGNOSIS — G4700 Insomnia, unspecified: Secondary | ICD-10-CM

## 2020-01-22 DIAGNOSIS — F959 Tic disorder, unspecified: Secondary | ICD-10-CM

## 2020-01-22 DIAGNOSIS — G4733 Obstructive sleep apnea (adult) (pediatric): Secondary | ICD-10-CM

## 2020-01-22 DIAGNOSIS — Z9989 Dependence on other enabling machines and devices: Secondary | ICD-10-CM

## 2020-01-24 ENCOUNTER — Ambulatory Visit (INDEPENDENT_AMBULATORY_CARE_PROVIDER_SITE_OTHER): Payer: Medicare Other | Admitting: Neurology

## 2020-01-24 ENCOUNTER — Other Ambulatory Visit: Payer: Self-pay

## 2020-01-24 ENCOUNTER — Encounter: Payer: Self-pay | Admitting: Neurology

## 2020-01-24 ENCOUNTER — Ambulatory Visit: Payer: Federal, State, Local not specified - PPO | Admitting: Adult Health

## 2020-01-24 VITALS — BP 114/72 | HR 71 | Temp 97.3°F | Ht 75.5 in | Wt 183.0 lb

## 2020-01-24 DIAGNOSIS — Z9989 Dependence on other enabling machines and devices: Secondary | ICD-10-CM | POA: Diagnosis not present

## 2020-01-24 DIAGNOSIS — G4733 Obstructive sleep apnea (adult) (pediatric): Secondary | ICD-10-CM

## 2020-01-24 DIAGNOSIS — F959 Tic disorder, unspecified: Secondary | ICD-10-CM | POA: Diagnosis not present

## 2020-01-24 MED ORDER — TRAZODONE HCL 50 MG PO TABS: ORAL_TABLET | ORAL | 5 refills | Status: DC

## 2020-01-24 MED ORDER — CLONAZEPAM 0.5 MG PO TABS: 0.5000 mg | ORAL_TABLET | Freq: Two times a day (BID) | ORAL | 5 refills | Status: DC | PRN
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
## 2020-02-15 ENCOUNTER — Other Ambulatory Visit: Payer: Self-pay | Admitting: Neurology

## 2020-02-19 NOTE — Telephone Encounter (Signed)
Pt is requesting 90 day supply of trazodone. Will send to Dr. Brett Fairy for approval.

## 2020-03-14 ENCOUNTER — Other Ambulatory Visit: Payer: Self-pay | Admitting: Neurology

## 2020-04-03 DIAGNOSIS — N5201 Erectile dysfunction due to arterial insufficiency: Secondary | ICD-10-CM | POA: Diagnosis not present

## 2020-04-03 DIAGNOSIS — N403 Nodular prostate with lower urinary tract symptoms: Secondary | ICD-10-CM | POA: Diagnosis not present

## 2020-04-03 DIAGNOSIS — R972 Elevated prostate specific antigen [PSA]: Secondary | ICD-10-CM | POA: Diagnosis not present

## 2020-04-03 DIAGNOSIS — R351 Nocturia: Secondary | ICD-10-CM | POA: Diagnosis not present

## 2020-05-25 ENCOUNTER — Other Ambulatory Visit: Payer: Self-pay | Admitting: Neurology

## 2020-06-12 DIAGNOSIS — R972 Elevated prostate specific antigen [PSA]: Secondary | ICD-10-CM | POA: Diagnosis not present

## 2020-07-18 ENCOUNTER — Other Ambulatory Visit: Payer: Self-pay | Admitting: Physician Assistant

## 2020-07-18 ENCOUNTER — Other Ambulatory Visit: Payer: Self-pay | Admitting: Neurology

## 2020-07-19 ENCOUNTER — Other Ambulatory Visit: Payer: Self-pay | Admitting: Neurology

## 2020-07-23 ENCOUNTER — Other Ambulatory Visit: Payer: Self-pay | Admitting: Neurology

## 2020-07-23 ENCOUNTER — Encounter: Payer: Self-pay | Admitting: Neurology

## 2020-07-23 ENCOUNTER — Ambulatory Visit: Payer: Federal, State, Local not specified - PPO | Admitting: Neurology

## 2020-07-23 MED ORDER — GABAPENTIN 100 MG PO CAPS: ORAL_CAPSULE | ORAL | 1 refills | Status: DC

## 2020-07-29 ENCOUNTER — Telehealth: Payer: Self-pay | Admitting: Neurology

## 2020-07-29 NOTE — Telephone Encounter (Signed)
..   Pt understands that although there may be some limitations with this type of visit, we will take all precautions to reduce any security or privacy concerns.  Pt understands that this will be treated like an in office visit and we will file with pt's insurance, and there may be a patient responsible charge related to this service. ? ?

## 2020-08-05 ENCOUNTER — Other Ambulatory Visit: Payer: Self-pay | Admitting: Neurology

## 2020-08-05 ENCOUNTER — Encounter: Payer: Self-pay | Admitting: Neurology

## 2020-08-31 ENCOUNTER — Encounter: Payer: Self-pay | Admitting: Neurology

## 2020-08-31 ENCOUNTER — Other Ambulatory Visit: Payer: Self-pay | Admitting: Neurology

## 2020-09-02 ENCOUNTER — Other Ambulatory Visit: Payer: Self-pay | Admitting: Neurology

## 2020-09-02 MED ORDER — CLONAZEPAM 0.5 MG PO TABS: ORAL_TABLET | ORAL | 0 refills | Status: DC

## 2020-09-18 ENCOUNTER — Telehealth (INDEPENDENT_AMBULATORY_CARE_PROVIDER_SITE_OTHER): Payer: Medicare Other | Admitting: Neurology

## 2020-09-18 DIAGNOSIS — Z9989 Dependence on other enabling machines and devices: Secondary | ICD-10-CM | POA: Diagnosis not present

## 2020-09-18 DIAGNOSIS — G4700 Insomnia, unspecified: Secondary | ICD-10-CM | POA: Diagnosis not present

## 2020-09-18 DIAGNOSIS — G5139 Clonic hemifacial spasm, unspecified: Secondary | ICD-10-CM

## 2020-09-18 DIAGNOSIS — G4730 Sleep apnea, unspecified: Secondary | ICD-10-CM | POA: Diagnosis not present

## 2020-09-18 DIAGNOSIS — I2510 Atherosclerotic heart disease of native coronary artery without angina pectoris: Secondary | ICD-10-CM | POA: Diagnosis not present

## 2020-09-18 DIAGNOSIS — G4733 Obstructive sleep apnea (adult) (pediatric): Secondary | ICD-10-CM

## 2020-09-18 MED ORDER — CLONAZEPAM 0.5 MG PO TABS
ORAL_TABLET | ORAL | 1 refills | Status: DC
Start: 2020-09-18 — End: 2020-10-23

## 2020-09-18 MED ORDER — TRAZODONE HCL 50 MG PO TABS
ORAL_TABLET | ORAL | 1 refills | Status: DC
Start: 2020-09-18 — End: 2020-12-18

## 2020-09-18 NOTE — Progress Notes (Signed)
Virtual Visit via Video Note  I connected with [name] on 09/18/20 at  8:30 AM EDT by a video enabled telemedicine application and verified that I am speaking with the correct person using two identifiers.  Location: Patient: at his home  Provider: at [facility] office    I discussed the limitations of evaluation and management by telemedicine and the availability of in person appointments. The patient expressed understanding and agreed to proceed.  History of Present Illness: [name] is a long-term established patient with a history of chronic insomnia, right facial tic, and user of CPAP in the treatment of obstructive sleep apnea.  He is taking tizanidine and Klonopin which need to be refilled today.  He is currently using a ResMed CPAP machine 10 AutoSet with a serial #[id number].  This is an auto titration machine and set at a pressure of 7 cmH2O with 3 cm EPR.    Observations/Objective: The patient CPAP compliance is excellent he is 69 years of age he is using the machine 100% of days and 100% of these days over 4 hours nightly.  He has actually a very high user time of 14 hours and 47 minutes which I will explain later.  Again CPAP is set at 7 cmH2O with 3 cm EPR, his residual AHI is 3.9/h he does not have significant air leakage in spite of using a nasal pillow.  Of his residual 1.4 central apneas and 2.1 obstructive apneas constitute the residual apnea index.  There are no central apneas arising.  He is not using a so clean machine another issue we will have to discuss today in case his machine would be on recall.  There is clearly hypersomnia present if somebody uses 14 hours of CPAP daily yet he describes that he barely ever sleeps?  He seems to retreat to bed in order to find comfort from abdominal pain, distention and bloating.  He denies aerophagia.  He does not use alcohol, he drinks about 2 mugs of coffee in the morning a day which is much less than he used to drink 1 or 2 years  ago.  And his right facial tic does not interfere with the use of a nasal pillow.  It seems that he does not sleep 14 hours but rests in supine.  His Epworth Sleepiness Scale was endorsed at only 2 points he actually stated that he has difficulties taking a nap and he may lay down for 1-1/2 hours without falling asleep in a quiet cool and dark room feeling comfortable otherwise.    Assessment and Plan:  1)This patient does still have insomnia not hypersomnia as reflected by Epworth score.  He is using his CPAP machine  14-15 hours a day to find relief from abdominal discomfort. His rsidual AHI is fine, no airleak and he tops all compliance charts.  2) GI pain-  I asked him to contact his primary care physician Dr. Ronalee Red, to see if this is a somatization problem or an organic problem.    I refilled tizanidine and Klonopin today.  A revisit once a year should be face-to-face, if the patient would need to have another video visit visit in 6 months I will defer to our nurse practitioner for that revisit.   Follow Up Instructions: RV in 6 month for refills of scheduled medications     I discussed the assessment and treatment plan with the patient. The patient was provided an opportunity to ask questions and  all were answered. The patient agreed with the plan and demonstrated an understanding of the instructions.   The patient was advised to call back or seek an in-person evaluation if the symptoms worsen or if the condition fails to improve as anticipated.  I provided 20 minutes of non-face-to-face time during this encounter.   Larey Seat, MD

## 2020-09-18 NOTE — Patient Instructions (Signed)

## 2020-09-22 ENCOUNTER — Other Ambulatory Visit: Payer: Self-pay | Admitting: Physician Assistant

## 2020-10-09 ENCOUNTER — Other Ambulatory Visit: Payer: Self-pay | Admitting: Neurology

## 2020-10-09 ENCOUNTER — Other Ambulatory Visit: Payer: Self-pay | Admitting: Physician Assistant

## 2020-10-10 ENCOUNTER — Other Ambulatory Visit: Payer: Self-pay | Admitting: Physician Assistant

## 2020-10-23 ENCOUNTER — Encounter: Payer: Self-pay | Admitting: Neurology

## 2020-10-23 MED ORDER — CLONAZEPAM 0.5 MG PO TABS
ORAL_TABLET | ORAL | 1 refills | Status: DC
Start: 2020-10-23 — End: 2020-11-21

## 2020-10-23 MED ORDER — MIRTAZAPINE 45 MG PO TABS
45.0000 mg | ORAL_TABLET | Freq: Every day | ORAL | 3 refills | Status: DC
Start: 2020-10-23 — End: 2021-09-26

## 2020-11-02 ENCOUNTER — Other Ambulatory Visit: Payer: Self-pay | Admitting: Neurology

## 2020-11-03 ENCOUNTER — Encounter: Payer: Self-pay | Admitting: Neurology

## 2020-11-04 ENCOUNTER — Telehealth: Payer: Self-pay | Admitting: Neurology

## 2020-11-04 ENCOUNTER — Other Ambulatory Visit: Payer: Self-pay | Admitting: Neurology

## 2020-11-04 NOTE — Telephone Encounter (Signed)
I have already fowarded the refill for the patient to the pharmacy as requested by the pharmacy

## 2020-11-04 NOTE — Telephone Encounter (Signed)
Pt. states the prescription that has been sent in is for the wrong medication. He states he needs it for tiZANidine (ZANAFLEX) 4 MG tablet. He states by this point it should have been for a 405 tablet refill. He asks for RN to touch base with him via mychart or a telephone call.  Pharmacy: [id number]

## 2020-11-05 ENCOUNTER — Ambulatory Visit: Payer: Medicare Other | Admitting: Family Medicine

## 2020-11-06 ENCOUNTER — Telehealth (INDEPENDENT_AMBULATORY_CARE_PROVIDER_SITE_OTHER): Payer: Medicare Other | Admitting: Family Medicine

## 2020-11-06 ENCOUNTER — Encounter: Payer: Self-pay | Admitting: Family Medicine

## 2020-11-06 ENCOUNTER — Other Ambulatory Visit: Payer: Medicare Other

## 2020-11-06 DIAGNOSIS — E785 Hyperlipidemia, unspecified: Secondary | ICD-10-CM

## 2020-11-06 DIAGNOSIS — I2510 Atherosclerotic heart disease of native coronary artery without angina pectoris: Secondary | ICD-10-CM | POA: Diagnosis not present

## 2020-11-06 DIAGNOSIS — R972 Elevated prostate specific antigen [PSA]: Secondary | ICD-10-CM | POA: Diagnosis not present

## 2020-11-06 DIAGNOSIS — M2550 Pain in unspecified joint: Secondary | ICD-10-CM

## 2020-11-06 DIAGNOSIS — E559 Vitamin D deficiency, unspecified: Secondary | ICD-10-CM | POA: Diagnosis not present

## 2020-11-06 DIAGNOSIS — R634 Abnormal weight loss: Secondary | ICD-10-CM

## 2020-11-06 LAB — COMPREHENSIVE METABOLIC PANEL
ALT: 10 U/L (ref 0–53)
AST: 12 U/L (ref 0–37)
Albumin: 4.1 g/dL (ref 3.5–5.2)
Alkaline Phosphatase: 49 U/L (ref 39–117)
BUN: 9 mg/dL (ref 6–23)
CO2: 29 mEq/L (ref 19–32)
Calcium: 9.4 mg/dL (ref 8.4–10.5)
Chloride: 98 mEq/L (ref 96–112)
Creatinine, Ser: 0.76 mg/dL (ref 0.40–1.50)
GFR: 92.2 mL/min (ref >60.00)
Glucose, Bld: 108 mg/dL — ABNORMAL HIGH (ref 70–99)
Potassium: 4.1 mEq/L (ref 3.5–5.1)
Sodium: 136 mEq/L (ref 135–145)
Total Bilirubin: 0.4 mg/dL (ref 0.2–1.2)
Total Protein: 6.9 g/dL (ref 6.0–8.3)

## 2020-11-06 LAB — VITAMIN D 25 HYDROXY (VIT D DEFICIENCY, FRACTURES): VITD: 85.72 ng/mL (ref 30.00–100.00)

## 2020-11-06 LAB — LIPID PANEL
Cholesterol: 134 mg/dL (ref 0–200)
HDL: 46.8 mg/dL (ref >39.00)
LDL Cholesterol: 68 mg/dL (ref 0–99)
NonHDL: 87.05
Total CHOL/HDL Ratio: 3
Triglycerides: 95 mg/dL (ref 0.0–149.0)
VLDL: 19 mg/dL (ref 0.0–40.0)

## 2020-11-06 NOTE — Progress Notes (Signed)
Virtual Visit via Video Note  I connected with [name] on 11/06/20 at  8:00 AM EST by a video enabled telemedicine application and verified that I am speaking with the correct person using two identifiers.  Location:/ persons in visit  Patient: home  Provider: home with wife in the other room    I discussed the limitations of evaluation and management by telemedicine and the availability of in person appointments. The patient expressed understanding and agreed to proceed.  History of Present Illness: Pt is home c/o pain in all his joints.   He takes tylenol arthritis with some relief but it wears off quick.  He has lost 25lbs in the last 2 months  He was not eating well so he has been working on his diet and eat more carbs and lean meat He is also c/o night sweats  He needs to make f/u app with cardiology and has an app with urology next week.  Dr Jeffie Pollock is concerned about prostate cancer    Observations/Objective:  There were no vitals filed for this visit. Pt is in nad   Assessment and Plan: 1. Hyperlipidemia, unspecified hyperlipidemia type Check labs F/u cardiology - Lipid panel; Future - Comprehensive metabolic panel; Future  2. Weight loss Pt will work on eating better and 3 meals a day F/u 2-3 weeks  - Lipid panel; Future - CBC with Differential/Platelet; Future - Thyroid Panel With TSH; Future - Comprehensive metabolic panel; Future - Rheumatoid Factor; Future - Antinuclear Antib (ANA); Future - Sedimentation rate; Future  3. Pain in joint, multiple sites Check labs  con't tylenol arthritis  - Rheumatoid Factor; Future - Antinuclear Antib (ANA); Future - Sedimentation rate; Future   Follow Up Instructions:    I discussed the assessment and treatment plan with the patient. The patient was provided an opportunity to ask questions and all were answered. The patient agreed with the plan and demonstrated an understanding of the instructions.   The patient was  advised to call back or seek an in-person evaluation if the symptoms worsen or if the condition fails to improve as anticipated.  I provided 25 minutes of non-face-to-face time during this encounter.   Ann Held, DO

## 2020-11-06 NOTE — Addendum Note (Signed)
Addended by: Kelle Darting A on: 11/06/2020 02:55 PM   Modules accepted: Orders

## 2020-11-06 NOTE — Addendum Note (Signed)
Addended by: Kelle Darting A on: 11/06/2020 01:28 PM   Modules accepted: Orders

## 2020-11-07 LAB — CBC WITH DIFFERENTIAL/PLATELET
Absolute Monocytes: 396 cells/uL (ref 200–950)
Basophils Absolute: 22 cells/uL (ref 0–200)
Basophils Relative: 0.4 %
Eosinophils Absolute: 22 cells/uL (ref 15–500)
Eosinophils Relative: 0.4 %
HCT: 33.9 % — ABNORMAL LOW (ref 38.5–50.0)
Hemoglobin: 11.2 g/dL — ABNORMAL LOW (ref 13.2–17.1)
Lymphs Abs: 836 cells/uL — ABNORMAL LOW (ref 850–3900)
MCH: 29.4 pg (ref 27.0–33.0)
MCHC: 33 g/dL (ref 32.0–36.0)
MCV: 89 fL (ref 80.0–100.0)
MPV: 10.2 fL (ref 7.5–12.5)
Monocytes Relative: 7.2 %
Neutro Abs: 4224 cells/uL (ref 1500–7800)
Neutrophils Relative %: 76.8 %
Platelets: 221 10*3/uL (ref 140–400)
RBC: 3.81 10*6/uL — ABNORMAL LOW (ref 4.20–5.80)
RDW: 12.6 % (ref 11.0–15.0)
Total Lymphocyte: 15.2 %
WBC: 5.5 10*3/uL (ref 3.8–10.8)

## 2020-11-07 LAB — SEDIMENTATION RATE: Sed Rate: 34 mm/h — ABNORMAL HIGH (ref 0–20)

## 2020-11-09 LAB — ANA: Anti Nuclear Antibody (ANA): NEGATIVE

## 2020-11-09 LAB — THYROID PANEL WITH TSH
Free Thyroxine Index: 1.7 (ref 1.4–3.8)
T3 Uptake: 33 % (ref 22–35)
T4, Total: 5.3 ug/dL (ref 4.9–10.5)
TSH: 1 mIU/L (ref 0.40–4.50)

## 2020-11-09 LAB — RHEUMATOID FACTOR: Rheumatoid fact SerPl-aCnc: <14 IU/mL (ref <14)
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
## 2020-11-12 ENCOUNTER — Other Ambulatory Visit: Payer: Self-pay

## 2020-11-12 ENCOUNTER — Telehealth (INDEPENDENT_AMBULATORY_CARE_PROVIDER_SITE_OTHER): Payer: Medicare Other | Admitting: Physician Assistant

## 2020-11-12 ENCOUNTER — Encounter: Payer: Self-pay | Admitting: Physician Assistant

## 2020-11-12 VITALS — BP 140/73 | HR 84 | Temp 98.9°F | Ht 75.5 in | Wt 160.0 lb

## 2020-11-12 DIAGNOSIS — E785 Hyperlipidemia, unspecified: Secondary | ICD-10-CM

## 2020-11-12 DIAGNOSIS — I714 Abdominal aortic aneurysm, without rupture, unspecified: Secondary | ICD-10-CM

## 2020-11-12 DIAGNOSIS — I2510 Atherosclerotic heart disease of native coronary artery without angina pectoris: Secondary | ICD-10-CM

## 2020-11-12 DIAGNOSIS — I77810 Thoracic aortic ectasia: Secondary | ICD-10-CM

## 2020-11-12 DIAGNOSIS — I253 Aneurysm of heart: Secondary | ICD-10-CM

## 2020-11-12 DIAGNOSIS — I10 Essential (primary) hypertension: Secondary | ICD-10-CM

## 2020-11-12 NOTE — Patient Instructions (Addendum)
Medication Instructions:  Your physician recommends that you continue on your current medications as directed. Please refer to the Current Medication list given to you today.  *If you need a refill on your cardiac medications before your next appointment, please call your pharmacy*  Lab Work: None ordered today  Testing/Procedures: Your physician has requested that you have an echocardiogram. Echocardiography is a painless test that uses sound waves to create images of your heart. It provides your doctor with information about the size and shape of your heart and how well your heart's chambers and valves are working. This procedure takes approximately one hour. There are no restrictions for this procedure.  Your physician has requested that you have an abdominal aorta duplex. During this test, an ultrasound is used to evaluate the aorta. Allow 30 minutes for this exam. Do not eat after midnight the day before and avoid carbonated beverages  Follow-Up: At [facility], you and your health needs are our priority.  As part of our continuing mission to provide you with exceptional heart care, we have created designated Provider Care Teams.  These Care Teams include your primary Cardiologist (physician) and Advanced Practice Providers (APPs -  Physician Assistants and Nurse Practitioners) who all work together to provide you with the care you need, when you need it.  Your next appointment:   12 month(s)  The format for your next appointment:   In Person  Provider:   [name], PA-C

## 2020-11-13 DIAGNOSIS — N403 Nodular prostate with lower urinary tract symptoms: Secondary | ICD-10-CM | POA: Diagnosis not present

## 2020-11-13 DIAGNOSIS — N5201 Erectile dysfunction due to arterial insufficiency: Secondary | ICD-10-CM | POA: Diagnosis not present

## 2020-11-13 DIAGNOSIS — R972 Elevated prostate specific antigen [PSA]: Secondary | ICD-10-CM | POA: Diagnosis not present

## 2020-11-13 DIAGNOSIS — R351 Nocturia: Secondary | ICD-10-CM | POA: Diagnosis not present

## 2020-11-14 ENCOUNTER — Other Ambulatory Visit: Payer: Self-pay | Admitting: Internal Medicine

## 2020-11-14 ENCOUNTER — Other Ambulatory Visit: Payer: Self-pay

## 2020-11-14 ENCOUNTER — Ambulatory Visit (HOSPITAL_COMMUNITY): Payer: Medicare Other | Attending: Cardiovascular Disease

## 2020-11-14 DIAGNOSIS — I77810 Thoracic aortic ectasia: Secondary | ICD-10-CM | POA: Diagnosis not present

## 2020-11-14 DIAGNOSIS — I253 Aneurysm of heart: Secondary | ICD-10-CM | POA: Insufficient documentation

## 2020-11-14 LAB — ECHOCARDIOGRAM COMPLETE
Area-P 1/2: 2.24 cm2
S' Lateral: 3.4 cm

## 2020-11-14 MED ORDER — PERFLUTREN LIPID MICROSPHERE
1.0000 mL | INTRAVENOUS | Status: AC | PRN
  Administered 2020-11-14: 2 mL via INTRAVENOUS

## 2020-11-15 ENCOUNTER — Other Ambulatory Visit (HOSPITAL_COMMUNITY): Payer: Self-pay | Admitting: Physician Assistant

## 2020-11-15 ENCOUNTER — Ambulatory Visit (HOSPITAL_COMMUNITY)
Admission: RE | Admit: 2020-11-15 | Discharge: 2020-11-15 | Disposition: A | Discharge status: Home / Self Care | Payer: Medicare Other | Source: Ambulatory Visit | Attending: Cardiovascular Disease | Admitting: Cardiovascular Disease

## 2020-11-15 DIAGNOSIS — I714 Abdominal aortic aneurysm, without rupture, unspecified: Secondary | ICD-10-CM

## 2020-11-15 NOTE — Telephone Encounter (Signed)
Refill request

## 2020-11-18 ENCOUNTER — Telehealth: Payer: Self-pay | Admitting: Physician Assistant

## 2020-11-18 ENCOUNTER — Encounter: Payer: Self-pay | Admitting: Physician Assistant

## 2020-11-18 DIAGNOSIS — I714 Abdominal aortic aneurysm, without rupture, unspecified: Secondary | ICD-10-CM

## 2020-11-18 DIAGNOSIS — I420 Dilated cardiomyopathy: Secondary | ICD-10-CM

## 2020-11-18 DIAGNOSIS — I2510 Atherosclerotic heart disease of native coronary artery without angina pectoris: Secondary | ICD-10-CM

## 2020-11-18 MED ORDER — CARVEDILOL 3.125 MG PO TABS: 3.1250 mg | ORAL_TABLET | Freq: Two times a day (BID) | ORAL | 11 refills | Status: DC

## 2020-11-18 NOTE — Telephone Encounter (Signed)
    Pt is returning [employer] call about his echo and AAA duplex result

## 2020-11-18 NOTE — Telephone Encounter (Addendum)
Please see result note from his echocardiogram. He needs a Lexiscan Myoview arranged.  I have place the order. He cannot do the test on 12/20, 12/28, 1/2 or 1/11.   The risks [chest pain, shortness of breath, cardiac arrhythmias, dizziness, blood pressure fluctuations, myocardial infarction, stroke/transient ischemic attack, nausea, vomiting, allergic reaction, radiation exposure, metallic taste sensation and life-threatening complications (estimated to be 1 in 10,000)], benefits (risk stratification, diagnosing coronary artery disease, treatment guidance) and alternatives of a nuclear stress test were discussed in detail with Mr. Shellhammer and he agrees to proceed.  I have sent Carvedilol 3.125 mg twice daily to his pharmacy.  I have ordered a f/u AAA u/s that needs to be scheduled 6 mos from now.  [name], PA-C    11/18/2020 5:13 PM

## 2020-11-19 ENCOUNTER — Other Ambulatory Visit: Payer: Self-pay

## 2020-11-19 ENCOUNTER — Ambulatory Visit (INDEPENDENT_AMBULATORY_CARE_PROVIDER_SITE_OTHER): Payer: Medicare Other | Admitting: Family Medicine

## 2020-11-19 ENCOUNTER — Encounter: Payer: Self-pay | Admitting: Family Medicine

## 2020-11-19 ENCOUNTER — Telehealth: Payer: Self-pay | Admitting: *Deleted

## 2020-11-19 VITALS — BP 116/68 | HR 70 | Temp 97.8°F | Resp 18 | Ht 75.5 in | Wt 165.4 lb

## 2020-11-19 DIAGNOSIS — D649 Anemia, unspecified: Secondary | ICD-10-CM | POA: Diagnosis not present

## 2020-11-19 DIAGNOSIS — M791 Myalgia, unspecified site: Secondary | ICD-10-CM

## 2020-11-19 DIAGNOSIS — R351 Nocturia: Secondary | ICD-10-CM

## 2020-11-19 DIAGNOSIS — R634 Abnormal weight loss: Secondary | ICD-10-CM

## 2020-11-19 DIAGNOSIS — I2510 Atherosclerotic heart disease of native coronary artery without angina pectoris: Secondary | ICD-10-CM | POA: Diagnosis not present

## 2020-11-19 NOTE — Assessment & Plan Note (Signed)
Check ifob Recheck cbc today

## 2020-11-19 NOTE — Patient Instructions (Signed)
Anemia  Anemia is a condition in which you do not have enough red blood cells or hemoglobin. Hemoglobin is a substance in red blood cells that carries oxygen. When you do not have enough red blood cells or hemoglobin (are anemic), your body cannot get enough oxygen and your organs may not work properly. As a result, you may feel very tired or have other problems. What are the causes? Common causes of anemia include:  Excessive bleeding. Anemia can be caused by excessive bleeding inside or outside the body, including bleeding from the intestine or from periods in women.  Poor nutrition.  Long-lasting (chronic) kidney, thyroid, and liver disease.  Bone marrow disorders.  Cancer and treatments for cancer.  HIV (human immunodeficiency virus) and AIDS (acquired immunodeficiency syndrome).  Treatments for HIV and AIDS.  Spleen problems.  Blood disorders.  Infections, medicines, and autoimmune disorders that destroy red blood cells. What are the signs or symptoms? Symptoms of this condition include:  Minor weakness.  Dizziness.  Headache.  Feeling heartbeats that are irregular or faster than normal (palpitations).  Shortness of breath, especially with exercise.  Paleness.  Cold sensitivity.  Indigestion.  Nausea.  Difficulty sleeping.  Difficulty concentrating. Symptoms may occur suddenly or develop slowly. If your anemia is mild, you may not have symptoms. How is this diagnosed? This condition is diagnosed based on:  Blood tests.  Your medical history.  A physical exam.  Bone marrow biopsy. Your health care provider may also check your stool (feces) for blood and may do additional testing to look for the cause of your bleeding. You may also have other tests, including:  Imaging tests, such as a CT scan or MRI.  Endoscopy.  Colonoscopy. How is this treated? Treatment for this condition depends on the cause. If you continue to lose a lot of blood, you may  need to be treated at a hospital. Treatment may include:  Taking supplements of iron, vitamin S31, or folic acid.  Taking a hormone medicine (erythropoietin) that can help to stimulate red blood cell growth.  Having a blood transfusion. This may be needed if you lose a lot of blood.  Making changes to your diet.  Having surgery to remove your spleen. Follow these instructions at home:  Take over-the-counter and prescription medicines only as told by your health care provider.  Take supplements only as told by your health care provider.  Follow any diet instructions that you were given.  Keep all follow-up visits as told by your health care provider. This is important. Contact a health care provider if:  You develop new bleeding anywhere in the body. Get help right away if:  You are very weak.  You are short of breath.  You have pain in your abdomen or chest.  You are dizzy or feel faint.  You have trouble concentrating.  You have bloody or black, tarry stools.  You vomit repeatedly or you vomit up blood. Summary  Anemia is a condition in which you do not have enough red blood cells or enough of a substance in your red blood cells that carries oxygen (hemoglobin).  Symptoms may occur suddenly or develop slowly.  If your anemia is mild, you may not have symptoms.  This condition is diagnosed with blood tests as well as a medical history and physical exam. Other tests may be needed.  Treatment for this condition depends on the cause of the anemia. This information is not intended to replace advice given to you by  your health care provider. Make sure you discuss any questions you have with your health care provider. Document Revised: 11/12/2017 Document Reviewed: 01/01/2017 Elsevier Patient Education  [place].

## 2020-11-19 NOTE — Telephone Encounter (Signed)
Pt returned an IFOB sample but I do not see any future orders in Epic.  Please place order as future, [facility] collect and [facility] as resulting agency. Thank you!
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
## 2020-11-19 NOTE — Telephone Encounter (Signed)
Done

## 2020-11-19 NOTE — Assessment & Plan Note (Signed)
Improved with tylenol but he needs to take regularly He is refusing rheum at this time

## 2020-11-20 ENCOUNTER — Other Ambulatory Visit (INDEPENDENT_AMBULATORY_CARE_PROVIDER_SITE_OTHER): Payer: Medicare Other

## 2020-11-20 ENCOUNTER — Encounter: Payer: Self-pay | Admitting: Neurology

## 2020-11-20 DIAGNOSIS — D649 Anemia, unspecified: Secondary | ICD-10-CM | POA: Diagnosis not present

## 2020-11-20 LAB — CBC WITH DIFFERENTIAL/PLATELET
Basophils Absolute: 0.2 10*3/uL — ABNORMAL HIGH (ref 0.0–0.1)
Basophils Relative: 3.5 % — ABNORMAL HIGH (ref 0.0–3.0)
Eosinophils Absolute: 0.1 10*3/uL (ref 0.0–0.7)
Eosinophils Relative: 1 % (ref 0.0–5.0)
HCT: 34.4 % — ABNORMAL LOW (ref 39.0–52.0)
Hemoglobin: 11.1 g/dL — ABNORMAL LOW (ref 13.0–17.0)
Lymphocytes Relative: 15.8 % (ref 12.0–46.0)
Lymphs Abs: 0.8 10*3/uL (ref 0.7–4.0)
MCHC: 32.4 g/dL (ref 30.0–36.0)
MCV: 90.4 fl (ref 78.0–100.0)
Monocytes Absolute: 0.4 10*3/uL (ref 0.1–1.0)
Monocytes Relative: 7.8 % (ref 3.0–12.0)
Neutro Abs: 3.7 10*3/uL (ref 1.4–7.7)
Neutrophils Relative %: 71.9 % (ref 43.0–77.0)
Platelets: 203 10*3/uL (ref 150.0–400.0)
RBC: 3.8 Mil/uL — ABNORMAL LOW (ref 4.22–5.81)
RDW: 14.6 % (ref 11.5–15.5)
WBC: 5.1 10*3/uL (ref 4.0–10.5)

## 2020-11-20 LAB — IBC + FERRITIN
Ferritin: 66.8 ng/mL (ref 22.0–322.0)
Iron: 70 ug/dL (ref 42–165)
Saturation Ratios: 20.9 % (ref 20.0–50.0)
Transferrin: 239 mg/dL (ref 212.0–360.0)

## 2020-11-20 LAB — PSA: PSA: 3.08 ng/mL (ref 0.10–4.00)

## 2020-11-20 LAB — SEDIMENTATION RATE: Sed Rate: 27 mm/hr — ABNORMAL HIGH (ref 0–20)

## 2020-11-20 LAB — FECAL OCCULT BLOOD, IMMUNOCHEMICAL: Fecal Occult Bld: NEGATIVE

## 2020-11-21 ENCOUNTER — Other Ambulatory Visit: Payer: Self-pay | Admitting: Neurology

## 2020-11-21 MED ORDER — CLONAZEPAM 0.5 MG PO TABS: ORAL_TABLET | ORAL | 1 refills | Status: DC

## 2020-11-22 ENCOUNTER — Other Ambulatory Visit: Payer: Self-pay | Admitting: Family Medicine

## 2020-11-22 DIAGNOSIS — D509 Iron deficiency anemia, unspecified: Secondary | ICD-10-CM

## 2020-11-27 ENCOUNTER — Telehealth: Payer: Self-pay

## 2020-11-27 NOTE — Telephone Encounter (Signed)
Spoke with the patient, detailed instructions given. He stated that he understood and would be here for his test. S.Catalena Stanhope EMTP

## 2020-12-03 ENCOUNTER — Ambulatory Visit (HOSPITAL_COMMUNITY): Payer: Medicare Other | Attending: Cardiology

## 2020-12-03 ENCOUNTER — Other Ambulatory Visit: Payer: Self-pay

## 2020-12-03 DIAGNOSIS — I420 Dilated cardiomyopathy: Secondary | ICD-10-CM

## 2020-12-03 DIAGNOSIS — I2510 Atherosclerotic heart disease of native coronary artery without angina pectoris: Secondary | ICD-10-CM | POA: Diagnosis not present

## 2020-12-03 LAB — MYOCARDIAL PERFUSION IMAGING
LV dias vol: 219 mL (ref 62–150)
LV sys vol: 147 mL
Peak HR: 83 {beats}/min
Rest HR: 57 {beats}/min
SDS: 12
SRS: 6
SSS: 19
TID: 0.97

## 2020-12-03 MED ORDER — REGADENOSON 0.4 MG/5ML IV SOLN
0.4000 mg | Freq: Once | INTRAVENOUS | Status: AC
  Administered 2020-12-03: 0.4 mg via INTRAVENOUS

## 2020-12-03 MED ORDER — TECHNETIUM TC 99M TETROFOSMIN IV KIT
30.6000 | PACK | Freq: Once | INTRAVENOUS | Status: AC | PRN
  Administered 2020-12-03: 30.6 via INTRAVENOUS
  Filled 2020-12-03: qty 31

## 2020-12-03 MED ORDER — TECHNETIUM TC 99M TETROFOSMIN IV KIT
10.3000 | PACK | Freq: Once | INTRAVENOUS | Status: AC | PRN
  Administered 2020-12-03: 10.3 via INTRAVENOUS
  Filled 2020-12-03: qty 11

## 2020-12-05 ENCOUNTER — Telehealth: Payer: Self-pay | Admitting: Cardiovascular Disease

## 2020-12-05 MED ORDER — NITROGLYCERIN 0.4 MG SL SUBL
0.4000 mg | SUBLINGUAL_TABLET | SUBLINGUAL | 3 refills | Status: DC | PRN
Start: 2020-12-05 — End: 2021-10-08

## 2020-12-05 NOTE — Telephone Encounter (Signed)
-----   Message from Liliane Shi, [place] sent at 12/05/2020  1:57 PM EST ----- I called patient and left a message for him to call back. Please let him know that his stress test is abnormal. There is a lot of scar in the anterior and inferior walls. Some if this is related to his known disease.  But, there may be some new disease (native arteries or bypass grafts) as well.  I have asked Dr. Burt Knack to take a look at his scan.  I would like him to be set up for an office visit with me in the next couple of weeks.  I will review with Dr. Burt Knack between now and then to decide if medical Rx vs cardiac catheterization is the right thing to do.  Make sure his NTG Rx is up to date.  He has not been having chest pain.  If he develops chest pain or shortness of breath, he should contact us right away or go to the ED if the symptoms are worrisome.  PLAN:  1. Schedule appt with me in the next 2 weeks in person 2. Make sure NTG Rx up to date.  3. Call or go to ED if he develops chest pain or shortness of breath. [name], PA-C    12/05/2020 1:52 PM

## 2020-12-05 NOTE — Telephone Encounter (Signed)
Pt called in and would like someone to call him about this test results .  He has seen the results on my chart but would like to talk to a nurse about them    Best number [phone]

## 2020-12-05 NOTE — Telephone Encounter (Signed)
Discussed results with patient.  NTG refilled and instructions reviewed. Scheduled the patient for visit with [facility] 12/18/2020. ER precautions reviewed. The patient was grateful for call and agrees with plan.

## 2020-12-16 DIAGNOSIS — R972 Elevated prostate specific antigen [PSA]: Secondary | ICD-10-CM | POA: Diagnosis not present
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
## 2020-12-18 ENCOUNTER — Other Ambulatory Visit (HOSPITAL_COMMUNITY)
Admission: RE | Admit: 2020-12-18 | Discharge: 2020-12-18 | Disposition: A | Discharge status: Home / Self Care | Payer: Medicare Other | Source: Ambulatory Visit | Attending: Interventional Cardiology | Admitting: Interventional Cardiology

## 2020-12-18 ENCOUNTER — Ambulatory Visit (INDEPENDENT_AMBULATORY_CARE_PROVIDER_SITE_OTHER): Payer: Medicare Other | Admitting: Physician Assistant

## 2020-12-18 ENCOUNTER — Encounter: Payer: Self-pay | Admitting: Physician Assistant

## 2020-12-18 ENCOUNTER — Other Ambulatory Visit: Payer: Self-pay

## 2020-12-18 VITALS — BP 120/76 | HR 67 | Ht 75.0 in | Wt 157.8 lb

## 2020-12-18 DIAGNOSIS — I2510 Atherosclerotic heart disease of native coronary artery without angina pectoris: Secondary | ICD-10-CM | POA: Diagnosis not present

## 2020-12-18 DIAGNOSIS — E785 Hyperlipidemia, unspecified: Secondary | ICD-10-CM | POA: Diagnosis not present

## 2020-12-18 DIAGNOSIS — I5020 Unspecified systolic (congestive) heart failure: Secondary | ICD-10-CM | POA: Diagnosis not present

## 2020-12-18 DIAGNOSIS — I714 Abdominal aortic aneurysm, without rupture, unspecified: Secondary | ICD-10-CM

## 2020-12-18 DIAGNOSIS — Z01812 Encounter for preprocedural laboratory examination: Secondary | ICD-10-CM | POA: Insufficient documentation

## 2020-12-18 DIAGNOSIS — I10 Essential (primary) hypertension: Secondary | ICD-10-CM

## 2020-12-18 DIAGNOSIS — I77810 Thoracic aortic ectasia: Secondary | ICD-10-CM | POA: Diagnosis not present

## 2020-12-18 DIAGNOSIS — Z20822 Contact with and (suspected) exposure to covid-19: Secondary | ICD-10-CM | POA: Diagnosis not present

## 2020-12-18 MED ORDER — ATORVASTATIN CALCIUM 20 MG PO TABS: 20.0000 mg | ORAL_TABLET | Freq: Every day | ORAL | 3 refills | Status: DC

## 2020-12-18 NOTE — Patient Instructions (Addendum)
Medication Instructions:  Your physician has recommended you make the following change in your medication:   1) Start Atorvastatin 20 mg, 1 tablet by mouth once a day  *If you need a refill on your cardiac medications before your next appointment, please call your pharmacy*  Lab Work: You will have labs drawn today: BMET/CBC  Testing/Procedures: Due to recent COVID-19 restrictions implemented by our local and state authorities and in an effort to keep both patients and staff as safe as possible, our hospital system requires COVID-19 testing prior to certain scheduled hospital procedures. Please go to [address] [facility]. [place], [place] [address] on 12/18/20 at 2:10PM. This is a drive up testing site. You will not need to exit your vehicle.  You will not be billed at the time of testing but may receive a bill later depending on your insurance. You must agree to self-quarantine from the time of your testing until the procedure date on 12/20/20. This should included staying home with ONLY the people you live with.  Avoid take-out, grocery store shopping or leaving the house for any non-emergent reason. Failure to have your COVID-19 test done on the date and time you have been scheduled will result in cancellation of your procedure. Please call our office at [phone] if you have any questions.  You are scheduled for a Cardiac Catheterization on Friday, January 7 with Dr. Everette Rank.  1. Please arrive at the [facility] (Main Entrance A) at [facility]: [address] [place], [place] [address] at 7:00 AM (This time is two hours before your procedure to ensure your preparation). Free valet parking service is available.   Special note: Every effort is made to have your procedure done on time. Please understand that emergencies sometimes delay scheduled procedures.  2. Diet: Do not eat solid foods after midnight.  The patient may have clear liquids until 5am upon the day of the procedure.  3.  Medication instructions in preparation for your procedure:  On the morning of your procedure, take your Aspirin and any morning medicines. You may use sips of water.  5. Plan for one night stay--bring personal belongings. 6. Bring a current list of your medications and current insurance cards. 7. You MUST have a responsible person to drive you home. 8. Someone MUST be with you the first 24 hours after you arrive home or your discharge will be delayed. 9. Please wear clothes that are easy to get on and off and wear slip-on shoes.  Thank you for allowing [place] to care for you!   -- [facility]  Follow-Up: On 01/10/21 at 11:45AM with Tereso Newcomer, PA-C

## 2020-12-19 ENCOUNTER — Telehealth: Payer: Self-pay | Admitting: Physician Assistant

## 2020-12-19 ENCOUNTER — Telehealth: Payer: Self-pay | Admitting: *Deleted

## 2020-12-19 LAB — CBC
Hematocrit: 35.6 % — ABNORMAL LOW (ref 37.5–51.0)
Hemoglobin: 11.9 g/dL — ABNORMAL LOW (ref 13.0–17.7)
MCH: 29.7 pg (ref 26.6–33.0)
MCHC: 33.4 g/dL (ref 31.5–35.7)
MCV: 89 fL (ref 79–97)
Platelets: 208 10*3/uL (ref 150–450)
RBC: 4.01 x10E6/uL — ABNORMAL LOW (ref 4.14–5.80)
RDW: 13.5 % (ref 11.6–15.4)
WBC: 7.8 10*3/uL (ref 3.4–10.8)

## 2020-12-19 LAB — BASIC METABOLIC PANEL
BUN/Creatinine Ratio: 16 (ref 10–24)
BUN: 13 mg/dL (ref 8–27)
CO2: 26 mmol/L (ref 20–29)
Calcium: 10 mg/dL (ref 8.6–10.2)
Chloride: 97 mmol/L (ref 96–106)
Creatinine, Ser: 0.82 mg/dL (ref 0.76–1.27)
GFR calc Af Amer: 105 mL/min/{1.73_m2} (ref >59)
GFR calc non Af Amer: 91 mL/min/{1.73_m2} (ref >59)
Glucose: 99 mg/dL (ref 65–99)
Potassium: 4.4 mmol/L (ref 3.5–5.2)
Sodium: 137 mmol/L (ref 134–144)

## 2020-12-19 LAB — SARS CORONAVIRUS 2 (TAT 6-24 HRS): SARS Coronavirus 2: NEGATIVE

## 2020-12-19 NOTE — Telephone Encounter (Signed)
    Pt is calling back, he said he made a mistake. His wife's appt is actually at 9:40 am on the 28th. He would like to know if he can get a different date.

## 2020-12-19 NOTE — Telephone Encounter (Signed)
I called and spoke with patient, his appointment has been moved from 11:45 to 8:45.

## 2020-12-19 NOTE — Telephone Encounter (Signed)
Pt contacted pre-catheterization scheduled at [facility] for: Friday December 20, 2020 9 AM Verified arrival time and place: [facility] Main Entrance A [facility]) at: 7 AM   No solid food after midnight prior to cath, clear liquids until 5 AM day of procedure.  AM meds can be  taken pre-cath with sips of water including: ASA 81 mg   Confirmed patient has responsible adult to drive home post procedure and be with patient first 24 hours after arriving home:   You are allowed ONE visitor in the waiting room during the time you are at the hospital for your procedure. Both you and your visitor must wear a mask once you enter the hospital.   Left detailed message with procedure instructions (DPR), call office if any questions.

## 2020-12-19 NOTE — Telephone Encounter (Signed)
° ° °  Pt said he have a conflict on 01/28, his wife also have a f/u appt in [place] and can't go back to  on time for his appt. He only wants to see Tereso Newcomer and next available appt is on 02/19/21. He would like to ask Lorin Picket if he can get an appt before or after the 28th.

## 2020-12-19 NOTE — Telephone Encounter (Signed)
I called and spoke with patient, he states that he called his wife's doctor and moved her appointment to [facility] on 01/10/21. He would like to leave his appointment at 8:45.

## 2020-12-20 ENCOUNTER — Ambulatory Visit (HOSPITAL_COMMUNITY)
Admission: RE | Admit: 2020-12-20 | Discharge: 2020-12-20 | Disposition: A | Discharge status: Home / Self Care | Payer: Medicare Other | Attending: Interventional Cardiology | Admitting: Interventional Cardiology

## 2020-12-20 ENCOUNTER — Encounter (HOSPITAL_COMMUNITY)
Admission: RE | Disposition: A | Discharge status: Home / Self Care | Payer: Self-pay | Source: Home / Self Care | Attending: Interventional Cardiology

## 2020-12-20 ENCOUNTER — Other Ambulatory Visit: Payer: Self-pay

## 2020-12-20 DIAGNOSIS — Z7982 Long term (current) use of aspirin: Secondary | ICD-10-CM | POA: Diagnosis not present

## 2020-12-20 DIAGNOSIS — Z880 Allergy status to penicillin: Secondary | ICD-10-CM | POA: Insufficient documentation

## 2020-12-20 DIAGNOSIS — I25119 Atherosclerotic heart disease of native coronary artery with unspecified angina pectoris: Secondary | ICD-10-CM | POA: Insufficient documentation

## 2020-12-20 DIAGNOSIS — E785 Hyperlipidemia, unspecified: Secondary | ICD-10-CM | POA: Diagnosis not present

## 2020-12-20 DIAGNOSIS — Z95820 Peripheral vascular angioplasty status with implants and grafts: Secondary | ICD-10-CM

## 2020-12-20 DIAGNOSIS — I2510 Atherosclerotic heart disease of native coronary artery without angina pectoris: Secondary | ICD-10-CM | POA: Diagnosis present

## 2020-12-20 DIAGNOSIS — I255 Ischemic cardiomyopathy: Secondary | ICD-10-CM | POA: Diagnosis not present

## 2020-12-20 DIAGNOSIS — I714 Abdominal aortic aneurysm, without rupture: Secondary | ICD-10-CM | POA: Diagnosis not present

## 2020-12-20 DIAGNOSIS — Z888 Allergy status to other drugs, medicaments and biological substances status: Secondary | ICD-10-CM | POA: Insufficient documentation

## 2020-12-20 DIAGNOSIS — I25118 Atherosclerotic heart disease of native coronary artery with other forms of angina pectoris: Secondary | ICD-10-CM

## 2020-12-20 DIAGNOSIS — Z87891 Personal history of nicotine dependence: Secondary | ICD-10-CM | POA: Insufficient documentation

## 2020-12-20 DIAGNOSIS — Z951 Presence of aortocoronary bypass graft: Secondary | ICD-10-CM | POA: Insufficient documentation

## 2020-12-20 DIAGNOSIS — I110 Hypertensive heart disease with heart failure: Secondary | ICD-10-CM | POA: Diagnosis not present

## 2020-12-20 DIAGNOSIS — Z885 Allergy status to narcotic agent status: Secondary | ICD-10-CM | POA: Diagnosis not present

## 2020-12-20 DIAGNOSIS — I10 Essential (primary) hypertension: Secondary | ICD-10-CM | POA: Diagnosis present

## 2020-12-20 DIAGNOSIS — M069 Rheumatoid arthritis, unspecified: Secondary | ICD-10-CM | POA: Diagnosis not present

## 2020-12-20 DIAGNOSIS — Z79899 Other long term (current) drug therapy: Secondary | ICD-10-CM | POA: Diagnosis not present

## 2020-12-20 DIAGNOSIS — Z86010 Personal history of colonic polyps: Secondary | ICD-10-CM

## 2020-12-20 DIAGNOSIS — I5020 Unspecified systolic (congestive) heart failure: Secondary | ICD-10-CM | POA: Insufficient documentation

## 2020-12-20 HISTORY — PX: LEFT HEART CATH AND CORS/GRAFTS ANGIOGRAPHY: CATH118250

## 2020-12-20 HISTORY — PX: CORONARY STENT INTERVENTION: CATH118234

## 2020-12-20 HISTORY — PX: ABDOMINAL AORTOGRAM: CATH118222

## 2020-12-20 HISTORY — PX: CORONARY ULTRASOUND/IVUS: CATH118244

## 2020-12-20 LAB — POCT ACTIVATED CLOTTING TIME
Activated Clotting Time: 285 seconds
Activated Clotting Time: 279 seconds

## 2020-12-20 SURGERY — LEFT HEART CATH AND CORS/GRAFTS ANGIOGRAPHY
Anesthesia: LOCAL

## 2020-12-20 MED ORDER — ALFUZOSIN HCL ER 10 MG PO TB24: 10.0000 mg | ORAL_TABLET | Freq: Every day | ORAL | Status: DC

## 2020-12-20 MED ORDER — CLOPIDOGREL BISULFATE 300 MG PO TABS
ORAL_TABLET | ORAL | Status: AC
  Filled 2020-12-20: qty 2

## 2020-12-20 MED ORDER — SODIUM CHLORIDE 0.9 % IV SOLN: 500.0000 mL | INTRAVENOUS | Status: DC

## 2020-12-20 MED ORDER — HEPARIN SODIUM (PORCINE) 1000 UNIT/ML IJ SOLN
INTRAMUSCULAR | Status: AC
  Filled 2020-12-20: qty 1

## 2020-12-20 MED ORDER — CLOPIDOGREL BISULFATE 75 MG PO TABS: 75.0000 mg | ORAL_TABLET | Freq: Every day | ORAL | 1 refills | Status: DC

## 2020-12-20 MED ORDER — VITAMIN B-12 1000 MCG SL SUBL: 1.0000 | SUBLINGUAL_TABLET | Freq: Every day | SUBLINGUAL | Status: DC

## 2020-12-20 MED ORDER — LABETALOL HCL 5 MG/ML IV SOLN: 10.0000 mg | INTRAVENOUS | Status: AC | PRN

## 2020-12-20 MED ORDER — HEPARIN SODIUM (PORCINE) 1000 UNIT/ML IJ SOLN
INTRAMUSCULAR | Status: DC | PRN
  Administered 2020-12-20: 7000 [IU] via INTRAVENOUS
  Administered 2020-12-20: 2000 [IU] via INTRAVENOUS
  Administered 2020-12-20: 3000 [IU] via INTRAVENOUS

## 2020-12-20 MED ORDER — IOHEXOL 350 MG/ML SOLN
INTRAVENOUS | Status: DC | PRN
  Administered 2020-12-20: 150 mL via INTRA_ARTERIAL

## 2020-12-20 MED ORDER — ATORVASTATIN CALCIUM 10 MG PO TABS: 20.0000 mg | ORAL_TABLET | Freq: Every day | ORAL | Status: DC

## 2020-12-20 MED ORDER — SODIUM CHLORIDE 0.9 % IV SOLN: 250.0000 mL | INTRAVENOUS | Status: DC | PRN

## 2020-12-20 MED ORDER — HEPARIN (PORCINE) IN NACL 1000-0.9 UT/500ML-% IV SOLN
INTRAVENOUS | Status: AC
  Filled 2020-12-20: qty 1000

## 2020-12-20 MED ORDER — MIDAZOLAM HCL 2 MG/2ML IJ SOLN
INTRAMUSCULAR | Status: AC
  Filled 2020-12-20: qty 2

## 2020-12-20 MED ORDER — FENTANYL CITRATE (PF) 100 MCG/2ML IJ SOLN
INTRAMUSCULAR | Status: DC | PRN
  Administered 2020-12-20 (×2): 25 ug via INTRAVENOUS

## 2020-12-20 MED ORDER — NITROGLYCERIN 0.4 MG SL SUBL: 0.4000 mg | SUBLINGUAL_TABLET | SUBLINGUAL | Status: DC | PRN

## 2020-12-20 MED ORDER — VERAPAMIL HCL 2.5 MG/ML IV SOLN
INTRAVENOUS | Status: DC | PRN
  Administered 2020-12-20 (×2): 10 mL via INTRA_ARTERIAL

## 2020-12-20 MED ORDER — SODIUM CHLORIDE 0.9 % IV SOLN: INTRAVENOUS | Status: AC

## 2020-12-20 MED ORDER — ONDANSETRON HCL 4 MG/2ML IJ SOLN: 4.0000 mg | Freq: Four times a day (QID) | INTRAMUSCULAR | Status: DC | PRN

## 2020-12-20 MED ORDER — GABAPENTIN 100 MG PO CAPS: 200.0000 mg | ORAL_CAPSULE | ORAL | Status: DC

## 2020-12-20 MED ORDER — HEPARIN (PORCINE) IN NACL 1000-0.9 UT/500ML-% IV SOLN
INTRAVENOUS | Status: DC | PRN
  Administered 2020-12-20 (×2): 500 mL

## 2020-12-20 MED ORDER — SODIUM CHLORIDE 0.9 % WEIGHT BASED INFUSION: 1.0000 mL/kg/h | INTRAVENOUS | Status: DC

## 2020-12-20 MED ORDER — CLONAZEPAM 1 MG PO TABS: 1.0000 mg | ORAL_TABLET | Freq: Two times a day (BID) | ORAL | Status: DC

## 2020-12-20 MED ORDER — VERAPAMIL HCL 2.5 MG/ML IV SOLN
INTRAVENOUS | Status: AC
  Filled 2020-12-20: qty 2

## 2020-12-20 MED ORDER — HYDRALAZINE HCL 20 MG/ML IJ SOLN: 10.0000 mg | INTRAMUSCULAR | Status: AC | PRN

## 2020-12-20 MED ORDER — MIDAZOLAM HCL 2 MG/2ML IJ SOLN
INTRAMUSCULAR | Status: DC | PRN
  Administered 2020-12-20: 1 mg via INTRAVENOUS
  Administered 2020-12-20: 2 mg via INTRAVENOUS

## 2020-12-20 MED ORDER — CLOPIDOGREL BISULFATE 300 MG PO TABS
ORAL_TABLET | ORAL | Status: DC | PRN
  Administered 2020-12-20: 600 mg via ORAL

## 2020-12-20 MED ORDER — LIDOCAINE HCL (PF) 1 % IJ SOLN
INTRAMUSCULAR | Status: AC
  Filled 2020-12-20: qty 30

## 2020-12-20 MED ORDER — CO Q 10 100 MG PO CAPS: 100.0000 mg | ORAL_CAPSULE | Freq: Every day | ORAL | Status: DC

## 2020-12-20 MED ORDER — MIRTAZAPINE 30 MG PO TABS: 45.0000 mg | ORAL_TABLET | Freq: Every day | ORAL | Status: DC

## 2020-12-20 MED ORDER — FENTANYL CITRATE (PF) 100 MCG/2ML IJ SOLN
INTRAMUSCULAR | Status: AC
  Filled 2020-12-20: qty 2

## 2020-12-20 MED ORDER — ASPIRIN 81 MG PO CHEW
81.0000 mg | CHEWABLE_TABLET | ORAL | Status: AC
  Administered 2020-12-20: 81 mg via ORAL
  Filled 2020-12-20: qty 1

## 2020-12-20 MED ORDER — SODIUM CHLORIDE 0.9 % WEIGHT BASED INFUSION
3.0000 mL/kg/h | INTRAVENOUS | Status: AC
  Administered 2020-12-20: 3 mL/kg/h via INTRAVENOUS

## 2020-12-20 MED ORDER — ANGIOPLASTY BOOK
Status: AC
  Filled 2020-12-20: qty 1

## 2020-12-20 MED ORDER — VALACYCLOVIR HCL 500 MG PO TABS: 500.0000 mg | ORAL_TABLET | Freq: Two times a day (BID) | ORAL | Status: DC | PRN

## 2020-12-20 MED ORDER — LIDOCAINE HCL (PF) 1 % IJ SOLN
INTRAMUSCULAR | Status: DC | PRN
  Administered 2020-12-20: 2 mL via INTRADERMAL

## 2020-12-20 MED ORDER — CARVEDILOL 3.125 MG PO TABS: 3.1250 mg | ORAL_TABLET | Freq: Two times a day (BID) | ORAL | Status: DC

## 2020-12-20 MED ORDER — SODIUM CHLORIDE 0.9% FLUSH: 3.0000 mL | INTRAVENOUS | Status: DC | PRN

## 2020-12-20 MED ORDER — ASPIRIN EC 81 MG PO TBEC: 81.0000 mg | DELAYED_RELEASE_TABLET | Freq: Every day | ORAL | Status: DC

## 2020-12-20 MED ORDER — SODIUM CHLORIDE 0.9% FLUSH: 3.0000 mL | Freq: Two times a day (BID) | INTRAVENOUS | Status: DC

## 2020-12-20 MED ORDER — ATORVASTATIN CALCIUM 40 MG PO TABS: 40.0000 mg | ORAL_TABLET | Freq: Every day | ORAL | 1 refills | Status: DC

## 2020-12-20 MED ORDER — CYCLOBENZAPRINE HCL 10 MG PO TABS: 10.0000 mg | ORAL_TABLET | Freq: Every day | ORAL | Status: DC

## 2020-12-20 MED ORDER — MAGNESIUM OXIDE -MG SUPPLEMENT 420 (252 MG) MG PO TABS: 420.0000 mg | ORAL_TABLET | Freq: Every day | ORAL | Status: DC

## 2020-12-20 MED ORDER — CLOPIDOGREL BISULFATE 75 MG PO TABS: 75.0000 mg | ORAL_TABLET | Freq: Every day | ORAL | Status: DC

## 2020-12-20 MED ORDER — ACETAMINOPHEN 325 MG PO TABS: 650.0000 mg | ORAL_TABLET | ORAL | Status: DC | PRN

## 2020-12-20 MED ORDER — TIZANIDINE HCL 4 MG PO TABS: 4.0000 mg | ORAL_TABLET | ORAL | Status: DC

## 2020-12-20 MED ORDER — RAMIPRIL 5 MG PO CAPS: 5.0000 mg | ORAL_CAPSULE | Freq: Every day | ORAL | Status: DC

## 2020-12-20 MED ORDER — ASPIRIN 81 MG PO CHEW: 81.0000 mg | CHEWABLE_TABLET | Freq: Every day | ORAL | Status: DC

## 2020-12-20 MED FILL — CLOPIDOGREL 75 MG TABLET: 75 | 90 days supply | Qty: 90 | Fill #0

## 2020-12-20 MED FILL — ATORVASTATIN CALCIUM 40 MG: 40 | 30 days supply | Qty: 30 | Fill #0

## 2020-12-20 SURGICAL SUPPLY — 20 items
BALLN SAPPHIRE 3.0X15 (BALLOONS) ×2
BALLN SAPPHIRE ~~LOC~~ 4.0X12 (BALLOONS) ×1 IMPLANT
BALLOON SAPPHIRE 3.0X15 (BALLOONS) IMPLANT
CATH INFINITI 5FR AL1 (CATHETERS) ×1 IMPLANT
CATH INFINITI 5FR MULTPACK ANG (CATHETERS) ×1 IMPLANT
CATH LAUNCHER 6FR EBU3.5 (CATHETERS) ×1 IMPLANT
CATH OPTICROSS HD (CATHETERS) ×1 IMPLANT
GLIDESHEATH SLEND SS 6F .021 (SHEATH) ×1 IMPLANT
GUIDEWIRE INQWIRE 1.5J.035X260 (WIRE) IMPLANT
INQWIRE 1.5J .035X260CM (WIRE) ×2
KIT ENCORE 26 ADVANTAGE (KITS) ×1 IMPLANT
KIT HEART LEFT (KITS) ×2 IMPLANT
KIT HEMO VALVE WATCHDOG (MISCELLANEOUS) ×1 IMPLANT
PACK CARDIAC CATHETERIZATION (CUSTOM PROCEDURE TRAY) ×2 IMPLANT
SLED PULL BACK IVUS (MISCELLANEOUS) ×1 IMPLANT
STENT RESOLUTE ONYX 3.5X15 (Permanent Stent) ×1 IMPLANT
TRANSDUCER W/STOPCOCK (MISCELLANEOUS) ×2 IMPLANT
TUBING CIL FLEX 10 FLL-RA (TUBING) ×2 IMPLANT
WIRE ASAHI PROWATER 180CM (WIRE) ×1 IMPLANT
WIRE HI TORQ BMW 190CM (WIRE) IMPLANT

## 2020-12-20 NOTE — Discharge Summary (Addendum)
Discharge Summary for Same Day PCI   Patient ID: [name] MRN: [id number]; DOB: [birth year]  Admit date: 12/20/2020 Discharge date: 12/20/2020  Primary Care Provider: Ann Held, DO  Primary Cardiologist: Sherren Mocha, MD  Primary Electrophysiologist:  None   Discharge Diagnoses    Principal Problem:   CAD (coronary artery disease) Active Problems:   Hyperlipidemia LDL goal <100   Essential hypertension   HFrEF (heart failure with reduced ejection fraction) [facility])    Diagnostic Studies/Procedures    Cardiac Catheterization 12/20/2020:   Ost RCA to Prox RCA lesion is 100% stenosed. SVG to RCA/PDA present. First limb to distal RCA patent. Second part to PDA is occluded.  RPDA lesion is 95% stenosed. RPAV lesion is 95% stenosed. These are small vessels.  Prox Graft lesion between Dist RCA and RPDA is 100% stenosed.  Ramus lesion is 100% stenosed. SVG to ramus is patent.  1st Mrg lesion is 100% stenosed. SVG to OM1/OM2 is occluded.  Mid Cx lesion is 50% stenosed. Circumflex has recanalized since the 2011 cath and there is flow to the OM2 through the native circuflex.  Origin to Prox Graft lesion before 1st Mrg is 100% stenosed.  Prox LAD lesion is 99% stenosed. LIMA to LAD is atretic.  A drug-eluting stent was successfully placed using a STENT RESOLUTE ONYX 3.5X15, postdilated to > 4 mm, optimized with IVUS.  Post intervention, there is a 0% residual stenosis.  There is moderate to severe left ventricular systolic dysfunction.  The left ventricular ejection fraction is 25-35% by visual estimate.  LV end diastolic pressure is normal.  There is no aortic valve stenosis.  Moderate sized calcified infrarenal AAA.   Short left main.  Longer catheter needed for the circumflex.   Consider clopidogrel monotherapy after 6 months of DAPT.   Continue aggressive secondary prevention.   Diagnostic Dominance: Right    Intervention     _____________    History of Present Illness     [name] is a 70 y.o. male with PMH of CAD s/p CABG 2002, cath 2012 3/5 patent grafts, CHF ( EF 40-45%), AAA, OSA, syncope, HTN, HLD who was recently seen in the office on 1/5 for follow up after having an abnormal myoview in the setting of weight loss. Myoview demonstrated an EF of 33 as well as inferior and anterior infarcts with minimal peri-infarct ischemia.  This was a high risk study.  [name] reviewed with [name] and it was felt that cardiac catheterization is reasonable given the change in his ejection fraction and large area of scar.  He returned to the office on 1/5 to discuss.   Cardiac catheterization was arranged for further evaluation.  Hospital Course     The patient underwent cardiac cath as noted above with PCI/DES of the pLAD in the setting of an atretic LIMA-LAD. Occluded SVG between dRCA and RPDA, SVG-OM1/2, along with origin to pSVG occluded at 1st OM. Patent SVG-Ramus. Plan for DAPT with ASA/plavix for at least 6 months, likely longer if tolerated. The patient was seen by cardiac rehab while in short stay. There were no observed complications post cath. Radial cath site was re-evaluated prior to discharge and found to be stable without any complications. Instructions/precautions regarding cath site care were given prior to discharge.  [name] was seen by [name] and determined stable for discharge home. Follow up with our office has been arranged. Medications are listed below. Pertinent changes include addition of  plavix, increased atorvastatin to 40mg  daily.  _____________  Cath/PCI Registry Performance & Quality Measures: 1. Aspirin prescribed? - Yes 2. ADP Receptor Inhibitor (Plavix/Clopidogrel, Brilinta/Ticagrelor or Effient/Prasugrel) prescribed (includes medically managed patients)? - Yes 3. High Intensity Statin (Lipitor 40-80mg  or Crestor 20-40mg ) prescribed? - Yes 4. For EF <40%, was ACEI/ARB prescribed? - Not  Applicable (EF >/= [id number]) 5. For EF <40%, Aldosterone Antagonist (Spironolactone or Eplerenone) prescribed? - Not Applicable (EF >/= [id number]) 6. Cardiac Rehab Phase II ordered (Included Medically managed Patients)? - Yes  _____________   Discharge Vitals Blood pressure (!) 151/79, pulse 67, temperature 97.8 F (36.6 C), temperature source Oral, resp. rate 10, height 6' 3.5" (1.918 m), weight 69.4 kg, SpO2 100 %.  Filed Weights   12/20/20 0656  Weight: 69.4 kg    Last Labs & Radiologic Studies    CBC Recent Labs    12/18/20 1336  WBC 7.8  HGB 11.9*  HCT 35.6*  MCV 89  PLT 123XX123   Basic Metabolic Panel Recent Labs    12/18/20 1336  NA 137  K 4.4  CL 97  CO2 26  GLUCOSE 99  BUN 13  CREATININE 0.82  CALCIUM 10.0   Liver Function Tests No results for input(s): AST, ALT, ALKPHOS, BILITOT, PROT, ALBUMIN in the last 72 hours. No results for input(s): LIPASE, AMYLASE in the last 72 hours. High Sensitivity Troponin:   No results for input(s): TROPONINIHS in the last 720 hours.  BNP Invalid input(s): POCBNP D-Dimer No results for input(s): DDIMER in the last 72 hours. Hemoglobin A1C No results for input(s): HGBA1C in the last 72 hours. Fasting Lipid Panel No results for input(s): CHOL, HDL, LDLCALC, TRIG, CHOLHDL, LDLDIRECT in the last 72 hours. Thyroid Function Tests No results for input(s): TSH, T4TOTAL, T3FREE, THYROIDAB in the last 72 hours.  Invalid input(s): FREET3 _____________  CARDIAC CATHETERIZATION  Result Date: 12/20/2020  Ost RCA to Prox RCA lesion is 100% stenosed. SVG to RCA/PDA present. First limb to distal RCA patent. Second part to PDA is occluded.  RPDA lesion is 95% stenosed. RPAV lesion is 95% stenosed. These are small vessels.  Prox Graft lesion between Dist RCA and RPDA is 100% stenosed.  Ramus lesion is 100% stenosed. SVG to ramus is patent.  1st Mrg lesion is 100% stenosed. SVG to OM1/OM2 is occluded.  Mid Cx lesion is 50% stenosed. Circumflex  has recanalized since the 2011 cath and there is flow to the OM2 through the native circuflex.  Origin to Prox Graft lesion before 1st Mrg is 100% stenosed.  Prox LAD lesion is 99% stenosed. LIMA to LAD is atretic.  A drug-eluting stent was successfully placed using a STENT RESOLUTE ONYX 3.5X15, postdilated to > 4 mm, optimized with IVUS.  Post intervention, there is a 0% residual stenosis.  There is moderate to severe left ventricular systolic dysfunction.  The left ventricular ejection fraction is 25-35% by visual estimate.  LV end diastolic pressure is normal.  There is no aortic valve stenosis.  Moderate sized calcified infrarenal AAA.  Short left main.  Longer catheter needed for the circumflex. Consider clopidogrel monotherapy after 6 months of DAPT. Continue aggressive secondary prevention.   PERIPHERAL VASCULAR CATHETERIZATION  Result Date: 12/20/2020  Ost RCA to Prox RCA lesion is 100% stenosed. SVG to RCA/PDA present. First limb to distal RCA patent. Second part to PDA is occluded.  RPDA lesion is 95% stenosed. RPAV lesion is 95% stenosed. These are small vessels.  Prox Graft lesion between Dist  RCA and RPDA is 100% stenosed.  Ramus lesion is 100% stenosed. SVG to ramus is patent.  1st Mrg lesion is 100% stenosed. SVG to OM1/OM2 is occluded.  Mid Cx lesion is 50% stenosed. Circumflex has recanalized since the 2011 cath and there is flow to the OM2 through the native circuflex.  Origin to Prox Graft lesion before 1st Mrg is 100% stenosed.  Prox LAD lesion is 99% stenosed. LIMA to LAD is atretic.  A drug-eluting stent was successfully placed using a STENT RESOLUTE ONYX 3.5X15, postdilated to > 4 mm, optimized with IVUS.  Post intervention, there is a 0% residual stenosis.  There is moderate to severe left ventricular systolic dysfunction.  The left ventricular ejection fraction is 25-35% by visual estimate.  LV end diastolic pressure is normal.  There is no aortic valve stenosis.   Moderate sized calcified infrarenal AAA.  Short left main.  Longer catheter needed for the circumflex. Consider clopidogrel monotherapy after 6 months of DAPT. Continue aggressive secondary prevention.   MYOCARDIAL PERFUSION IMAGING  Result Date: 12/03/2020  The left ventricular ejection fraction is moderately decreased (30-44%).  Nuclear stress EF: 33%.  There was no ST segment deviation noted during stress.  There is a large defect of severe severity present in the basal inferior, mid inferior and apical inferior location.  There is a medium defect of severe severity present in the mid anterior, apical anterior and apex location.  There is a large defect of severe severity present in the basal inferoseptal, mid anteroseptal, mid inferoseptal and apical septal location.  Findings consistent with prior myocardial infarction with minimal peri-infarct ishemia.  This is a high risk study.  Gwyndolyn Kaufman, MD    Disposition   Pt is being discharged home today in good condition.  Follow-up Plans & Appointments     Follow-up Information    Liliane Shi, PA-C Follow up on 12/20/2020.   Specialties: Cardiology, Physician Assistant Why: at 8:45am for your follow up appt Contact information: [address] [place] [address] [phone]              Discharge Instructions    Amb Referral to Cardiac Rehabilitation   Complete by: As directed    Diagnosis: Coronary Stents   After initial evaluation and assessments completed: Virtual Based Care may be provided alone or in conjunction with Phase 2 Cardiac Rehab based on patient barriers.: Yes       Discharge Medications   Allergies as of 12/20/2020      Reactions   Xifaxan [rifaximin] Nausea And Vomiting   Very strong antibotic (* Dr Henrene Pastor) made pt very sick   Celexa [citalopram Hydrobromide]    Suffer diaherra   Chlorhexidine    USED FOR LAST SURGERY 08/2012 CAUSED SKIN IRRITATION- soap used pre  surgical    Dilaudid [hydromorphone Hcl] Other (See Comments)   Goes bonkers, fell, hyper, thinking he was doing things that he wasn't doing. Didn't help with pain.   Doxycycline    Upset stomach   Isosorbide Mononitrate Other (See Comments)   migraine type headache with sustained release form   Penicillins Other (See Comments)         Medication List    STOP taking these medications   POLICOSANOL PO     TAKE these medications   acetaminophen 650 MG CR tablet Commonly known as: TYLENOL Take 650 mg by mouth in the morning, at noon, and at bedtime.   alfuzosin 10 MG 24  hr tablet Commonly known as: UROXATRAL Take 10 mg by mouth daily.   aspirin EC 81 MG tablet Take 1 tablet (81 mg total) by mouth daily. What changed: when to take this   atorvastatin 40 MG tablet Commonly known as: LIPITOR Take 1 tablet (40 mg total) by mouth daily. What changed:   medication strength  how much to take   carvedilol 3.125 MG tablet Commonly known as: COREG Take 1 tablet (3.125 mg total) by mouth 2 (two) times daily.   clonazePAM 0.5 MG tablet Commonly known as: KLONOPIN TAKE 1-2 TABLETS BY MOUTH 2 (TWO) TIMES DAILY AS NEEDED FOR ANXIETY. What changed:   how much to take  how to take this  when to take this  additional instructions   clopidogrel 75 MG tablet Commonly known as: PLAVIX Take 1 tablet (75 mg total) by mouth daily with breakfast. Start taking on: December 21, 2020   Co Q 10 100 MG Caps Take 100 mg by mouth daily.   cyclobenzaprine 10 MG tablet Commonly known as: FLEXERIL Take 10 mg by mouth at bedtime.   Fish Oil 1000 MG Caps Take 2,000 mg by mouth daily.   gabapentin 100 MG capsule Commonly known as: NEURONTIN TAKE 1 CAPSULE BY MOUTH FOUR TIMES A DAY What changed:   how much to take  how to take this  when to take this  additional instructions   Magnesium Oxide 420 (252 Mg) MG Tabs Take 420 mg by mouth daily. Chelated Magnesium   mirtazapine  45 MG tablet Commonly known as: REMERON Take 1 tablet (45 mg total) by mouth at bedtime.   multivitamin tablet Take 2 tablets by mouth daily. Mega mv   nitroGLYCERIN 0.4 MG SL tablet Commonly known as: Nitrostat Place 1 tablet (0.4 mg total) under the tongue every 5 (five) minutes as needed. for chest pain What changed:   when to take this  reasons to take this  additional instructions   ofloxacin 0.3 % OTIC solution Commonly known as: Floxin Otic Place 10 drops into both ears daily. What changed:   when to take this  reasons to take this   ramipril 5 MG capsule Commonly known as: ALTACE TAKE 1 CAPSULE BY MOUTH DAILY. *MAKE OVERDUE APPT WITH DR. What changed: See the new instructions.   tiZANidine 4 MG tablet Commonly known as: ZANAFLEX TAKE 1-1&1/2 TABLETS BY MOUTH 2 TIMES DAILY AS NEEDED FOR MUSCLE SPASMS AND 1-1&1/2 TABS AT NIGHT What changed: See the new instructions.   TURMERIC PO Take 1,000 mg by mouth 2 (two) times daily.   valACYclovir 500 MG tablet Commonly known as: VALTREX TAKE 1 TABLET BY MOUTH 2 (TWO) TIMES DAILY AS NEEDED. FOR FLARE UP OF COLD SORE What changed: See the new instructions.   Vitamin B-12 1000 MCG Subl Place 1 tablet (1,000 mcg total) under the tongue daily.          Allergies Allergies  Allergen Reactions  . Xifaxan [Rifaximin] Nausea And Vomiting    Very strong antibotic (* Dr Henrene Pastor) made pt very sick  . Celexa [Citalopram Hydrobromide]     Suffer diaherra  . Chlorhexidine     USED FOR LAST SURGERY 08/2012 CAUSED SKIN IRRITATION- soap used pre surgical   . Dilaudid [Hydromorphone Hcl] Other (See Comments)    Goes bonkers, fell, hyper, thinking he was doing things that he wasn't doing. Didn't help with pain.  . Doxycycline     Upset stomach  . Isosorbide Mononitrate Other (See  Comments)    migraine type headache with sustained release form  . Penicillins Other (See Comments)        Outstanding Labs/Studies   FLP/LFTs  in 8 weeks  Duration of Discharge Encounter   Greater than 30 minutes including physician time.  Signed, Reino Bellis, NP 12/20/2020, 2:11 PM   I have examined the patient and reviewed assessment and plan and discussed with patient.  Agree with above as stated.    No evidence of bleeding at the left wrist.    Complex coronary anatomy after CABG, ectatic native vessels.  Small vessel disease in the PDA, PLA.  SVG to PDA occluded.  SVG to RCA patent.  SVG to ramus patent.  SVG to OM1/OM2 occluded.  Native circumflex patent with moderate disease and ectasia.   LIMA to LAD atretic.  Success IVUS guided DES to the proximal LAD. No chest pain with balloon inflations.  Low EF, normal LVEDP.    I stressed the importance of DAPT for 6 months.  Would recommend clopidogrel monotherapy going forward.   Recheck echo in 6-8 weeks.  Larae Grooms

## 2020-12-20 NOTE — Progress Notes (Signed)
Small quarter size hematoma noted left radial site on arrival from cath lab; pressure held x 15 min and air added to trb to 15cc

## 2020-12-20 NOTE — Progress Notes (Signed)
Ok to d/c home per [employer]

## 2020-12-20 NOTE — Interval H&P Note (Signed)
Cath Lab Visit (complete for each Cath Lab visit)  Clinical Evaluation Leading to the Procedure:   ACS: No.  Non-ACS:    Anginal Classification: CCS II  Anti-ischemic medical therapy: Maximal Therapy (2 or more classes of medications)  Non-Invasive Test Results: High-risk stress test findings: cardiac mortality >3%/year  Prior CABG: Previous CABG      History and Physical Interval Note:  12/20/2020 8:11 AM  [name]  has presented today for surgery, with the diagnosis of CAD and Heart Faliure.  The various methods of treatment have been discussed with the patient and family. After consideration of risks, benefits and other options for treatment, the patient has consented to  Procedure(s): LEFT HEART CATH AND CORS/GRAFTS ANGIOGRAPHY (N/A) as a surgical intervention.  The patient's history has been reviewed, patient examined, no change in status, stable for surgery.  I have reviewed the patient's chart and labs.  Questions were answered to the patient's satisfaction.     Larae Grooms

## 2020-12-20 NOTE — Progress Notes (Signed)
[id number] Education completed with pt who voiced understanding. Stressed importance of plavix with stent. Reviewed NTG use, walking for exercise, and gave heart healthy and low sodium diets. Discussed CRP 2 and referred to [place]. Pt stated his wife is going to have surgery and need to have care for at least a month . He would not be able to attend until after this time. Encouraged pt to discuss with CR staff if he needs extra time. Pt stated he attended program in 2002 after CABG. Graylon Good RN BSN 12/20/2020 11:41 AM

## 2020-12-20 NOTE — Discharge Instructions (Signed)
Coronary Angiogram With Stent, Care After This sheet gives you information about how to care for yourself after your procedure. Your health care provider may also give you more specific instructions. If you have problems or questions, contact your health care provider. What can I expect after the procedure? After the procedure, it is common to have:  Bruising and tenderness at the insertion site. This usually fades within 1-2 weeks.  A collection of blood under the skin (hematoma). This usually decreases within 1-2 weeks. Follow these instructions at home: Medicines  Take over-the-counter and prescription medicines only as told by your health care provider.  If you were prescribed an antibiotic medicine, take it as told by your health care provider. Do not stop using the antibiotic even if you start to feel better.  If you take medicines for diabetes, your health care provider may need to change how much you take. Ask your health care provider for specific directions about taking your diabetes medicines.  If you are taking blood thinners: ? Talk with your health care provider before you take any medicines that contain aspirin or NSAIDs, such as ibuprofen. These medicines increase your risk for dangerous bleeding. ? Take your medicine exactly as told, at the same time every day. ? Avoid activities that could cause injury or bruising, and follow instructions about how to prevent falls. ? Wear a medical alert bracelet or carry a card that lists what medicines you take. Eating and drinking   Follow instructions from your health care provider about eating or drinking restrictions.  Eat a heart-healthy diet that includes plenty of fresh fruits and vegetables.  Avoid foods that are high in salt, sugar, or saturated fat. Avoid fried foods or canned or highly processed food.  Drink enough fluid to keep your urine pale yellow. Alcohol use  Do not drink alcohol if: ? Your health care provider  tells you not to. ? You are pregnant, may be pregnant, or plan to become pregnant.  If you drink alcohol: ? Limit how much you use to:  0-1 drink a day for women.  0-2 drinks a day for men. ? Be aware of how much alcohol is in your drink. In the [place] one drink equals one 12 oz bottle of beer (355 mL), one 5 oz glass of wine (148 mL), or one 1 oz glass of hard liquor (44 mL). Bathing  Do not take baths, swim, or use a hot tub until your health care provider approves. Ask your health care provider if you may take showers. You may only be allowed to take sponge baths.  Gently wash the insertion site with plain soap and water.  Pat the area dry with a clean towel. Do not rub. This may cause bleeding. Incision care  Follow instructions from your health care provider about how to take care of your insertion area. Make sure you: ? Wash your hands with soap and water before and after you change your bandage (dressing). If soap and water are not available, use hand sanitizer. ? Change your dressing as told by your health care provider. ? Leave stitches (sutures) or adhesive strips in place. These skin closures may need to stay in place for 2 weeks or longer. If adhesive strip edges start to loosen and curl up, you may trim the loose edges. Do not remove adhesive strips completely unless your health care provider tells you to do that.  Do not apply powder or lotion on the insertion area.  Check  your insertion area every day for signs of infection. Check for: ? Redness, swelling, or pain. ? Fluid or blood. ? Warmth. ? Pus or a bad smell. Activity  Do not drive for 24 hours if you were given a sedative during your procedure.  Rest as told by your health care provider. ? Avoid sitting for a long time without moving. Get up to take short walks every 1-2 hours. This is important to improve blood flow and breathing. Ask for help if you feel weak or unsteady.  Do not lift anything that is  heavier than 10 lb (4.5 kg), or the limit that you are told, until your health care provider says that it is safe.  Return to your normal activities as told by your health care provider. Ask your health care provider what activities are safe for you. Lifestyle   Do not use any products that contain nicotine or tobacco, such as cigarettes, e-cigarettes, and chewing tobacco. If you need help quitting, ask your health care provider.  If needed, work with your health care provider to treat other problems, such as being overweight, or having high blood pressure or diabetes.  Get regular exercise. Do exercises as told by your health care provider. General instructions  Tell all your health care providers that you have a stent. This is especially important if you are going to get imaging studies, such as MRI.  Wear compression stockings as told by your health care provider. These stockings help to prevent blood clots and reduce swelling in your legs.  Do not strain during a bowel movement if the procedure was done through your leg. Straining may cause bleeding from the insertion site.  Keep all follow-up visits as directed by your health care provider. This is important. Contact a health care provider if you:  Have a fever.  Have chills.  Have redness, swelling, or pain around your insertion area.  Have fluid or blood (other than a little blood on the dressing) coming from your insertion area.  Notice that your insertion area feels warm to the touch.  Have pus or a bad smell coming from your insertion area.  Have more bleeding from the insertion area. Hold pressure on the area. Get help right away if:  You develop chest pain or shortness of breath.  You feel like fainting or you faint.  Your leg or arm becomes cool, numb, or tingly.  You have unusual pain.  Your insertion area is bleeding, and bleeding continues after 30 minutes of steadily held pressure.  You develop bleeding  anywhere else, including from your rectum. There may be bright red blood in your urine or stool, or you may have black, tarry stool. These symptoms may represent a serious problem that is an emergency. Do not wait to see if the symptoms will go away. Get medical help right away. Call your local emergency services (911 in the [place] Do not drive yourself to the hospital. Summary  After this procedure, it is common to have bruising and tenderness around the catheter insertion site. This will go away in a few weeks.  Follow your health care provider's instructions about caring for your insertion site. Change dressing and clean the area as instructed.  Eat a heart-healthy diet. Limit alcohol use. Do not use tobacco or nicotine.  Contact a health care provider if you have fever or chills, or if you have pus or a bad smell coming from the site.  Get help right away if you   develop chest pain, you faint, or have bleeding at the insertion site. This information is not intended to replace advice given to you by your health care provider. Make sure you discuss any questions you have with your health care provider. Document Revised: 06/21/2019 Document Reviewed: 06/21/2019 Elsevier Patient Education  Person  This sheet gives you information about how to care for yourself after your procedure. Your health care provider may also give you more specific instructions. If you have problems or questions, contact your health care provider. What can I expect after the procedure? After the procedure, it is common to have:  Bruising and tenderness at the catheter insertion area. Follow these instructions at home: Medicines  Take over-the-counter and prescription medicines only as told by your health care provider. Insertion site care  Follow instructions from your health care provider about how to take care of your insertion site. Make sure you: ? Wash your hands with soap and  water before you change your bandage (dressing). If soap and water are not available, use hand sanitizer. ? Change your dressing as told by your health care provider. ? Leave stitches (sutures), skin glue, or adhesive strips in place. These skin closures may need to stay in place for 2 weeks or longer. If adhesive strip edges start to loosen and curl up, you may trim the loose edges. Do not remove adhesive strips completely unless your health care provider tells you to do that.  Check your insertion site every day for signs of infection. Check for: ? Redness, swelling, or pain. ? Fluid or blood. ? Pus or a bad smell. ? Warmth.  Do not take baths, swim, or use a hot tub until your health care provider approves.  You may shower 24-48 hours after the procedure, or as directed by your health care provider. ? Remove the dressing and gently wash the site with plain soap and water. ? Pat the area dry with a clean towel. ? Do not rub the site. That could cause bleeding.  Do not apply powder or lotion to the site. Activity   For 24 hours after the procedure, or as directed by your health care provider: ? Do not flex or bend the affected arm. ? Do not push or pull heavy objects with the affected arm. ? Do not drive yourself home from the hospital or clinic. You may drive 24 hours after the procedure unless your health care provider tells you not to. ? Do not operate machinery or power tools.  Do not lift anything that is heavier than 10 lb (4.5 kg), or the limit that you are told, until your health care provider says that it is safe.  Ask your health care provider when it is okay to: ? Return to work or school. ? Resume usual physical activities or sports. ? Resume sexual activity. General instructions  If the catheter site starts to bleed, raise your arm and put firm pressure on the site. If the bleeding does not stop, get help right away. This is a medical emergency.  If you went home on  the same day as your procedure, a responsible adult should be with you for the first 24 hours after you arrive home.  Keep all follow-up visits as told by your health care provider. This is important. Contact a health care provider if:  You have a fever.  You have redness, swelling, or yellow drainage around your insertion site. Get help right away if:  You have unusual pain at the radial site.  The catheter insertion area swells very fast.  The insertion area is bleeding, and the bleeding does not stop when you hold steady pressure on the area.  Your arm or hand becomes pale, cool, tingly, or numb. These symptoms may represent a serious problem that is an emergency. Do not wait to see if the symptoms will go away. Get medical help right away. Call your local emergency services (911 in the [place] Do not drive yourself to the hospital. Summary  After the procedure, it is common to have bruising and tenderness at the site.  Follow instructions from your health care provider about how to take care of your radial site wound. Check the wound every day for signs of infection.  Do not lift anything that is heavier than 10 lb (4.5 kg), or the limit that you are told, until your health care provider says that it is safe. This information is not intended to replace advice given to you by your health care provider. Make sure you discuss any questions you have with your health care provider. Document Revised: 01/05/2018 Document Reviewed: 01/05/2018 Elsevier Patient Education  2020 [employer].

## 2020-12-23 ENCOUNTER — Encounter (HOSPITAL_COMMUNITY): Payer: Self-pay | Admitting: Interventional Cardiology

## 2020-12-31 ENCOUNTER — Other Ambulatory Visit: Payer: Self-pay | Admitting: Neurology

## 2021-01-07 ENCOUNTER — Other Ambulatory Visit: Payer: Self-pay | Admitting: Neurology

## 2021-01-07 ENCOUNTER — Encounter: Payer: Self-pay | Admitting: Neurology

## 2021-01-08 MED ORDER — CLONAZEPAM 0.5 MG PO TABS: ORAL_TABLET | ORAL | 5 refills | Status: DC
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
## 2021-01-10 ENCOUNTER — Other Ambulatory Visit: Payer: Self-pay

## 2021-01-10 ENCOUNTER — Ambulatory Visit (INDEPENDENT_AMBULATORY_CARE_PROVIDER_SITE_OTHER): Payer: Medicare Other | Admitting: Physician Assistant

## 2021-01-10 ENCOUNTER — Encounter: Payer: Self-pay | Admitting: Physician Assistant

## 2021-01-10 ENCOUNTER — Ambulatory Visit: Payer: Medicare Other | Admitting: Physician Assistant

## 2021-01-10 VITALS — BP 138/70 | HR 59 | Ht 75.5 in | Wt 168.0 lb

## 2021-01-10 DIAGNOSIS — I714 Abdominal aortic aneurysm, without rupture, unspecified: Secondary | ICD-10-CM

## 2021-01-10 DIAGNOSIS — I10 Essential (primary) hypertension: Secondary | ICD-10-CM | POA: Diagnosis not present

## 2021-01-10 DIAGNOSIS — E785 Hyperlipidemia, unspecified: Secondary | ICD-10-CM | POA: Diagnosis not present

## 2021-01-10 DIAGNOSIS — I2510 Atherosclerotic heart disease of native coronary artery without angina pectoris: Secondary | ICD-10-CM | POA: Diagnosis not present

## 2021-01-10 DIAGNOSIS — I5020 Unspecified systolic (congestive) heart failure: Secondary | ICD-10-CM | POA: Diagnosis not present

## 2021-01-10 DIAGNOSIS — I77810 Thoracic aortic ectasia: Secondary | ICD-10-CM

## 2021-01-10 NOTE — Patient Instructions (Signed)
Medication Instructions:  Your physician recommends that you continue on your current medications as directed. Please refer to the Current Medication list given to you today.  *If you need a refill on your cardiac medications before your next appointment, please call your pharmacy*  Lab Work: None ordered today  Testing/Procedures: None ordered today  Follow-Up: At [facility], you and your health needs are our priority.  As part of our continuing mission to provide you with exceptional heart care, we have created designated Provider Care Teams.  These Care Teams include your primary Cardiologist (physician) and Advanced Practice Providers (APPs -  Physician Assistants and Nurse Practitioners) who all work together to provide you with the care you need, when you need it.  Your next appointment:   3 month(s) on 04/11/21 at 9:15AM  The format for your next appointment:   In Person  Provider:   [name], PA-C  Other Instructions Check your blood pressure at home for 2 weeks and send readings through my chart.

## 2021-01-14 ENCOUNTER — Telehealth (HOSPITAL_COMMUNITY): Payer: Self-pay | Admitting: *Deleted

## 2021-01-14 NOTE — Telephone Encounter (Signed)
-----   Message from Liliane Shi, [place] sent at 01/13/2021  5:19 PM EST ----- Regarding: RE: Madaline Brilliant to schedule for cardiac rehab Thanks for reaching out. I have never been able to find information that says definitively "this is the BP you should avoid, etc".  So, I would say he should avoid exercise if BP is >150/95 (either pre exercise or during exercise). He should probably avoid lifting > 40 lbs. Thanks Event organiser   ----- Message ----- From: Rowe Pavy, RN Sent: 01/10/2021   4:02 PM EST To: Liliane Shi, PA-C Subject: Ok to schedule for cardiac rehab                Nicki Reaper,   The above pt referred to cardiac rehab s/p DES to LAD on 12/15/20.  Noted in your progress note, AAA 4.5 cm.  Any Blood Pressure parameters for resting and exertional readings?  Any activity restrictions or lifting restrictions?  Thanks for your valued input  Psychologist, clinical, BSN Cardiac and Pulmonary Rehab Nurse Navigator

## 2021-01-20 ENCOUNTER — Telehealth (HOSPITAL_COMMUNITY): Payer: Self-pay

## 2021-01-20 ENCOUNTER — Encounter (HOSPITAL_COMMUNITY): Payer: Self-pay

## 2021-01-20 NOTE — Telephone Encounter (Signed)
Attempted to call patient in regards to Cardiac Rehab - LM on VM Mailed letter 

## 2021-01-24 DIAGNOSIS — Z79899 Other long term (current) drug therapy: Secondary | ICD-10-CM

## 2021-01-24 DIAGNOSIS — I10 Essential (primary) hypertension: Secondary | ICD-10-CM

## 2021-01-24 NOTE — Telephone Encounter (Signed)
Increase Ramipril to 5 mg twice daily BMET in 2 weeks. [name], PA-C    01/24/2021 6:29 PM

## 2021-01-27 MED ORDER — RAMIPRIL 5 MG PO CAPS: 5.0000 mg | ORAL_CAPSULE | Freq: Two times a day (BID) | ORAL | 3 refills | Status: DC

## 2021-01-27 NOTE — Telephone Encounter (Signed)
Placed order for Ramipril 5 mg BID and BMET in 2 weeks.

## 2021-02-03 ENCOUNTER — Telehealth (HOSPITAL_COMMUNITY): Payer: Self-pay

## 2021-02-03 NOTE — Telephone Encounter (Signed)
No response from pt.  Closed referral  

## 2021-02-10 ENCOUNTER — Other Ambulatory Visit: Payer: Self-pay

## 2021-02-10 ENCOUNTER — Other Ambulatory Visit: Payer: Medicare Other | Admitting: *Deleted

## 2021-02-10 DIAGNOSIS — Z79899 Other long term (current) drug therapy: Secondary | ICD-10-CM | POA: Diagnosis not present

## 2021-02-10 DIAGNOSIS — I10 Essential (primary) hypertension: Secondary | ICD-10-CM

## 2021-02-11 LAB — BASIC METABOLIC PANEL
BUN/Creatinine Ratio: 13 (ref 10–24)
BUN: 12 mg/dL (ref 8–27)
CO2: 23 mmol/L (ref 20–29)
Calcium: 9.8 mg/dL (ref 8.6–10.2)
Chloride: 100 mmol/L (ref 96–106)
Creatinine, Ser: 0.96 mg/dL (ref 0.76–1.27)
Glucose: 100 mg/dL — ABNORMAL HIGH (ref 65–99)
Potassium: 4 mmol/L (ref 3.5–5.2)
Sodium: 139 mmol/L (ref 134–144)
eGFR: 86 mL/min/{1.73_m2} (ref >59)

## 2021-03-10 MED ORDER — CLOPIDOGREL BISULFATE 75 MG PO TABS: 75.0000 mg | ORAL_TABLET | Freq: Every day | ORAL | 3 refills | Status: DC

## 2021-03-26 ENCOUNTER — Other Ambulatory Visit: Payer: Self-pay | Admitting: Neurology
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
## 2021-04-11 ENCOUNTER — Encounter: Payer: Self-pay | Admitting: Physician Assistant

## 2021-04-11 ENCOUNTER — Ambulatory Visit (INDEPENDENT_AMBULATORY_CARE_PROVIDER_SITE_OTHER): Payer: Medicare Other | Admitting: Physician Assistant

## 2021-04-11 ENCOUNTER — Other Ambulatory Visit: Payer: Self-pay

## 2021-04-11 VITALS — BP 124/56 | HR 62 | Ht 75.0 in | Wt 174.4 lb

## 2021-04-11 DIAGNOSIS — I2510 Atherosclerotic heart disease of native coronary artery without angina pectoris: Secondary | ICD-10-CM

## 2021-04-11 DIAGNOSIS — I10 Essential (primary) hypertension: Secondary | ICD-10-CM | POA: Diagnosis not present

## 2021-04-11 DIAGNOSIS — I714 Abdominal aortic aneurysm, without rupture, unspecified: Secondary | ICD-10-CM

## 2021-04-11 DIAGNOSIS — I5020 Unspecified systolic (congestive) heart failure: Secondary | ICD-10-CM | POA: Diagnosis not present

## 2021-04-11 NOTE — Patient Instructions (Signed)
Medication Instructions:  Your physician recommends that you continue on your current medications as directed. Please refer to the Current Medication list given to you today.  *If you need a refill on your cardiac medications before your next appointment, please call your pharmacy*   Lab Work: NONE   If you have labs (blood work) drawn today and your tests are completely normal, you will receive your results only by: Marland Kitchen MyChart Message (if you have MyChart) OR . A paper copy in the mail If you have any lab test that is abnormal or we need to change your treatment, we will call you to review the results.   Testing/Procedures: Your physician has requested that you have an [place], June 7 @ 10:35 AM AT [place]!!   Echocardiography is a painless test that uses sound waves to create images of your heart. It provides your doctor with information about the size and shape of your heart and how well your heart's chambers and valves are working. This procedure takes approximately one hour. There are no restrictions for this procedure.   AAA DUPLEX ON Tuesday, June 7 @ 9:00 AM AT [place].  Follow-Up: At [facility], you and your health needs are our priority.  As part of our continuing mission to provide you with exceptional heart care, we have created designated Provider Care Teams.  These Care Teams include your primary Cardiologist (physician) and Advanced Practice Providers (APPs -  Physician Assistants and Nurse Practitioners) who all work together to provide you with the care you need, when you need it.  We recommend signing up for the patient portal called "MyChart".  Sign up information is provided on this After Visit Summary.  MyChart is used to connect with patients for Virtual Visits (Telemedicine).  Patients are able to view lab/test results, encounter notes, upcoming appointments, etc.  Non-urgent messages can be sent to your provider as  well.   To learn more about what you can do with MyChart, go to [email or web address].    Your next appointment:   6 month(s)  The format for your next appointment:   In Person  Provider:   [name], PA-C   Other Instructions Your physician wants you to follow-up in: 6 months with [name], PA.  You will receive a reminder letter in the mail two months in advance. If you don't receive a letter, please call our office to schedule the follow-up appointment.

## 2021-05-06 DIAGNOSIS — R972 Elevated prostate specific antigen [PSA]: Secondary | ICD-10-CM | POA: Diagnosis not present

## 2021-05-14 DIAGNOSIS — N5201 Erectile dysfunction due to arterial insufficiency: Secondary | ICD-10-CM | POA: Diagnosis not present

## 2021-05-14 DIAGNOSIS — R351 Nocturia: Secondary | ICD-10-CM | POA: Diagnosis not present

## 2021-05-14 DIAGNOSIS — N403 Nodular prostate with lower urinary tract symptoms: Secondary | ICD-10-CM | POA: Diagnosis not present

## 2021-05-14 DIAGNOSIS — R972 Elevated prostate specific antigen [PSA]: Secondary | ICD-10-CM | POA: Diagnosis not present

## 2021-05-14 LAB — PSA: PSA: 1.87

## 2021-05-20 ENCOUNTER — Ambulatory Visit (HOSPITAL_COMMUNITY)
Admission: RE | Admit: 2021-05-20 | Discharge: 2021-05-20 | Disposition: A | Discharge status: Home / Self Care | Payer: Medicare Other | Source: Ambulatory Visit | Attending: Internal Medicine | Admitting: Internal Medicine

## 2021-05-20 ENCOUNTER — Other Ambulatory Visit: Payer: Self-pay | Admitting: Physician Assistant

## 2021-05-20 ENCOUNTER — Encounter: Payer: Self-pay | Admitting: Physician Assistant

## 2021-05-20 ENCOUNTER — Ambulatory Visit (HOSPITAL_BASED_OUTPATIENT_CLINIC_OR_DEPARTMENT_OTHER): Payer: Medicare Other

## 2021-05-20 ENCOUNTER — Other Ambulatory Visit: Payer: Self-pay

## 2021-05-20 DIAGNOSIS — I714 Abdominal aortic aneurysm, without rupture, unspecified: Secondary | ICD-10-CM

## 2021-05-20 DIAGNOSIS — I5020 Unspecified systolic (congestive) heart failure: Secondary | ICD-10-CM

## 2021-05-20 DIAGNOSIS — I10 Essential (primary) hypertension: Secondary | ICD-10-CM

## 2021-05-20 DIAGNOSIS — I2510 Atherosclerotic heart disease of native coronary artery without angina pectoris: Secondary | ICD-10-CM | POA: Insufficient documentation

## 2021-05-20 LAB — ECHOCARDIOGRAM COMPLETE
Area-P 1/2: 2.66 cm2
S' Lateral: 3.8 cm

## 2021-05-21 ENCOUNTER — Telehealth: Payer: Self-pay | Admitting: Physician Assistant

## 2021-05-21 ENCOUNTER — Other Ambulatory Visit: Payer: Self-pay | Admitting: *Deleted

## 2021-05-21 DIAGNOSIS — I5020 Unspecified systolic (congestive) heart failure: Secondary | ICD-10-CM

## 2021-05-21 MED ORDER — ENTRESTO 49-51 MG PO TABS: 1.0000 | ORAL_TABLET | Freq: Two times a day (BID) | ORAL | 3 refills | Status: DC

## 2021-05-21 NOTE — Addendum Note (Signed)
Addended by: Carter Kitten D on: 05/21/2021 04:03 PM   Modules accepted: Orders

## 2021-05-21 NOTE — Telephone Encounter (Signed)
I s/w pt, pt stated went to pharmacy and was told needs a PA for new medication.  Sent in a new script with the PA on it.  Also left pt 10 copay card up front. Pt was very appreciative.

## 2021-05-21 NOTE — Telephone Encounter (Signed)
    Pt c/o medication issue:  1. Name of Medication: sacubitril-valsartan (ENTRESTO) 49-51 MG  2. How are you currently taking this medication (dosage and times per day)? Take 1 tablet by mouth 2 (two) times daily. Patient will start first dose 36 hours after Ramipril  3. Are you having a reaction (difficulty breathing--STAT)?   4. What is your medication issue? Pt is calling back regarding his new prescription. When he tried to pick up his entresto pharmacy said he needs prior auth and now he thinks this is going to be an expensive drug. He said him and his wife is on a fix income and can't afford anything right now

## 2021-05-22 ENCOUNTER — Ambulatory Visit (INDEPENDENT_AMBULATORY_CARE_PROVIDER_SITE_OTHER): Payer: Medicare Other | Admitting: Family Medicine

## 2021-05-22 ENCOUNTER — Encounter: Payer: Self-pay | Admitting: Physician Assistant

## 2021-05-22 ENCOUNTER — Other Ambulatory Visit: Payer: Self-pay

## 2021-05-22 VITALS — BP 128/86 | HR 57 | Temp 98.3°F | Resp 18 | Ht 75.0 in | Wt 186.4 lb

## 2021-05-22 DIAGNOSIS — E785 Hyperlipidemia, unspecified: Secondary | ICD-10-CM | POA: Diagnosis not present

## 2021-05-22 DIAGNOSIS — D649 Anemia, unspecified: Secondary | ICD-10-CM

## 2021-05-22 DIAGNOSIS — I714 Abdominal aortic aneurysm, without rupture, unspecified: Secondary | ICD-10-CM

## 2021-05-22 DIAGNOSIS — I25118 Atherosclerotic heart disease of native coronary artery with other forms of angina pectoris: Secondary | ICD-10-CM | POA: Diagnosis not present

## 2021-05-22 DIAGNOSIS — I10 Essential (primary) hypertension: Secondary | ICD-10-CM | POA: Diagnosis not present

## 2021-05-22 DIAGNOSIS — I2510 Atherosclerotic heart disease of native coronary artery without angina pectoris: Secondary | ICD-10-CM | POA: Diagnosis not present

## 2021-05-22 DIAGNOSIS — I5020 Unspecified systolic (congestive) heart failure: Secondary | ICD-10-CM | POA: Diagnosis not present

## 2021-05-22 LAB — CBC WITH DIFFERENTIAL/PLATELET
Basophils Absolute: 0 10*3/uL (ref 0.0–0.1)
Basophils Relative: 0.4 % (ref 0.0–3.0)
Eosinophils Absolute: 0.1 10*3/uL (ref 0.0–0.7)
Eosinophils Relative: 1.9 % (ref 0.0–5.0)
HCT: 37.6 % — ABNORMAL LOW (ref 39.0–52.0)
Hemoglobin: 12.9 g/dL — ABNORMAL LOW (ref 13.0–17.0)
Lymphocytes Relative: 33 % (ref 12.0–46.0)
Lymphs Abs: 1.4 10*3/uL (ref 0.7–4.0)
MCHC: 34.3 g/dL (ref 30.0–36.0)
MCV: 93.1 fl (ref 78.0–100.0)
Monocytes Absolute: 0.3 10*3/uL (ref 0.1–1.0)
Monocytes Relative: 8 % (ref 3.0–12.0)
Neutro Abs: 2.5 10*3/uL (ref 1.4–7.7)
Neutrophils Relative %: 56.7 % (ref 43.0–77.0)
Platelets: 148 10*3/uL — ABNORMAL LOW (ref 150.0–400.0)
RBC: 4.04 Mil/uL — ABNORMAL LOW (ref 4.22–5.81)
RDW: 14.3 % (ref 11.5–15.5)
WBC: 4.4 10*3/uL (ref 4.0–10.5)

## 2021-05-22 LAB — COMPREHENSIVE METABOLIC PANEL
ALT: 35 U/L (ref 0–53)
AST: 24 U/L (ref 0–37)
Albumin: 4.8 g/dL (ref 3.5–5.2)
Alkaline Phosphatase: 64 U/L (ref 39–117)
BUN: 13 mg/dL (ref 6–23)
CO2: 29 mEq/L (ref 19–32)
Calcium: 9.8 mg/dL (ref 8.4–10.5)
Chloride: 101 mEq/L (ref 96–112)
Creatinine, Ser: 0.88 mg/dL (ref 0.40–1.50)
GFR: 87.87 mL/min (ref >60.00)
Glucose, Bld: 100 mg/dL — ABNORMAL HIGH (ref 70–99)
Potassium: 4.3 mEq/L (ref 3.5–5.1)
Sodium: 137 mEq/L (ref 135–145)
Total Bilirubin: 0.6 mg/dL (ref 0.2–1.2)
Total Protein: 7.4 g/dL (ref 6.0–8.3)

## 2021-05-22 LAB — LIPID PANEL
Cholesterol: 145 mg/dL (ref 0–200)
HDL: 56.9 mg/dL (ref >39.00)
LDL Cholesterol: 61 mg/dL (ref 0–99)
NonHDL: 87.94
Total CHOL/HDL Ratio: 3
Triglycerides: 134 mg/dL (ref 0.0–149.0)
VLDL: 26.8 mg/dL (ref 0.0–40.0)

## 2021-05-22 LAB — IBC + FERRITIN
Ferritin: 47.8 ng/mL (ref 22.0–322.0)
Iron: 139 ug/dL (ref 42–165)
Saturation Ratios: 30.6 % (ref 20.0–50.0)
Transferrin: 324 mg/dL (ref 212.0–360.0)

## 2021-05-22 NOTE — Patient Instructions (Signed)
[employer] medicine (25th ed., pp. 1059-1068). [place], [place]: Elsevier.">  Anemia  Anemia is a condition in which there is not enough red blood cells or hemoglobin in the blood. Hemoglobin is a substance in red blood cells that carries oxygen. When you do not have enough red blood cells or hemoglobin (are anemic), your body cannot get enough oxygen and your organs may not work properly. As a result, you may feel very tired or have other problems. What are the causes? Common causes of anemia include:  Excessive bleeding. Anemia can be caused by excessive bleeding inside or outside the body, including bleeding from the intestines or from heavy menstrual periods in females.  Poor nutrition.  Long-lasting (chronic) kidney, thyroid, and liver disease.  Bone marrow disorders, spleen problems, and blood disorders.  Cancer and treatments for cancer.  HIV (human immunodeficiency virus) and AIDS (acquired immunodeficiency syndrome).  Infections, medicines, and autoimmune disorders that destroy red blood cells. What are the signs or symptoms? Symptoms of this condition include:  Minor weakness.  Dizziness.  Headache, or difficulties concentrating and sleeping.  Heartbeats that feel irregular or faster than normal (palpitations).  Shortness of breath, especially with exercise.  Pale skin, lips, and nails, or cold hands and feet.  Indigestion and nausea. Symptoms may occur suddenly or develop slowly. If your anemia is mild, you may not have symptoms. How is this diagnosed? This condition is diagnosed based on blood tests, your medical history, and a physical exam. In some cases, a test may be needed in which cells are removed from the soft tissue inside of a bone and looked at under a microscope (bone marrow biopsy). Your health care provider may also check your stool (feces) for blood and may do additional testing to look for the cause of your bleeding. Other tests may  include:  Imaging tests, such as a CT scan or MRI.  A procedure to see inside your esophagus and stomach (endoscopy).  A procedure to see inside your colon and rectum (colonoscopy). How is this treated? Treatment for this condition depends on the cause. If you continue to lose a lot of blood, you may need to be treated at a hospital. Treatment may include:  Taking supplements of iron, vitamin B12, or folic acid.  Taking a hormone medicine (erythropoietin) that can help to stimulate red blood cell growth.  Having a blood transfusion. This may be needed if you lose a lot of blood.  Making changes to your diet.  Having surgery to remove your spleen. Follow these instructions at home:  Take over-the-counter and prescription medicines only as told by your health care provider.  Take supplements only as told by your health care provider.  Follow any diet instructions that you were given by your health care provider.  Keep all follow-up visits as told by your health care provider. This is important. Contact a health care provider if:  You develop new bleeding anywhere in the body. Get help right away if:  You are very weak.  You are short of breath.  You have pain in your abdomen or chest.  You are dizzy or feel faint.  You have trouble concentrating.  You have bloody stools, black stools, or tarry stools.  You vomit repeatedly or you vomit up blood. These symptoms may represent a serious problem that is an emergency. Do not wait to see if the symptoms will go away. Get medical help right away. Call your local emergency services (911 in the [place] Do not   drive yourself to the hospital. Summary  Anemia is a condition in which you do not have enough red blood cells or enough of a substance in your red blood cells that carries oxygen (hemoglobin).  Symptoms may occur suddenly or develop slowly.  If your anemia is mild, you may not have symptoms.  This condition is  diagnosed with blood tests, a medical history, and a physical exam. Other tests may be needed.  Treatment for this condition depends on the cause of the anemia. This information is not intended to replace advice given to you by your health care provider. Make sure you discuss any questions you have with your health care provider. Document Revised: 11/07/2019 Document Reviewed: 11/07/2019 Elsevier Patient Education  2021 Elsevier Inc.  

## 2021-05-22 NOTE — Assessment & Plan Note (Signed)
Recheck labs today. 
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
## 2021-05-22 NOTE — Assessment & Plan Note (Signed)
Per cardiology Pt on entresto

## 2021-05-22 NOTE — Assessment & Plan Note (Signed)
Encouraged heart healthy diet, increase exercise, avoid trans fats, consider a krill oil cap daily 

## 2021-05-22 NOTE — Assessment & Plan Note (Signed)
Stable

## 2021-05-22 NOTE — Assessment & Plan Note (Signed)
Well controlled, no changes to meds. Encouraged heart healthy diet such as the DASH diet and exercise as tolerated.  °

## 2021-05-22 NOTE — Telephone Encounter (Signed)
**Note De-Identified Wileen Duncanson Obfuscation** I started an [place] PA through covermymeds and received the following message:  SALIK GREWELL Key: [id number] - PA Case ID: [id number] - Rx #: [id number] Outcome: Approved today Your PA request has been approved. Additional information will be provided in the approval communication. (Message 1145) Drug: Delene Loll 49-51MG  tablets Form: Charity fundraiser PA Form (2017 NCP[employer])  I have notified [facility] of this approval.

## 2021-05-22 NOTE — Telephone Encounter (Addendum)
**Note De-Identified Humzah Harty Obfuscation** Letter from [facility] stating that the pts Entesto PA has been approved from 04/22/2021 until 05/22/2023.

## 2021-05-22 NOTE — Assessment & Plan Note (Addendum)
Per cardiology  Cath with stent in 12/20/20  . Ost RCA to Prox RCA lesion is 100% stenosed. SVG to RCA/PDA present. First limb to distal RCA patent. Second part to PDA is occluded. Marland Kitchen RPDA lesion is 95% stenosed. RPAV lesion is 95% stenosed. These are small vessels. . Prox Graft lesion between Dist RCA and RPDA is 100% stenosed. . Ramus lesion is 100% stenosed. SVG to ramus is patent. . 1st Mrg lesion is 100% stenosed. SVG to OM1/OM2 is occluded. . Mid Cx lesion is 50% stenosed. Circumflex has recanalized since the 2011 cath and there is flow to the OM2 through the native circuflex. . Origin to Prox Graft lesion before 1st Mrg is 100% stenosed. . Prox LAD lesion is 99% stenosed. LIMA to LAD is atretic. . A drug-eluting stent was successfully placed using a STENT RESOLUTE ONYX 3.5X15, postdilated to > 4 mm, optimized with IVUS. Marland Kitchen Post intervention, there is a 0% residual stenosis. . There is moderate to severe left ventricular systolic dysfunction. . The left ventricular ejection fraction is 25-35% by visual estimate. . LV end diastolic pressure is normal. . There is no aortic valve stenosis. . Moderate sized calcified infrarenal AAA.   Short left main.  Longer catheter needed for the circumflex.  Consider clopidogrel monotherapy after 6 months of DAPT.  Continue aggressive secondary prevention.

## 2021-05-23 ENCOUNTER — Other Ambulatory Visit: Payer: Self-pay | Admitting: *Deleted

## 2021-05-23 DIAGNOSIS — I714 Abdominal aortic aneurysm, without rupture, unspecified: Secondary | ICD-10-CM

## 2021-06-06 ENCOUNTER — Other Ambulatory Visit: Payer: Medicare Other | Admitting: *Deleted

## 2021-06-06 ENCOUNTER — Other Ambulatory Visit: Payer: Self-pay

## 2021-06-06 DIAGNOSIS — I5020 Unspecified systolic (congestive) heart failure: Secondary | ICD-10-CM | POA: Diagnosis not present

## 2021-06-06 LAB — BASIC METABOLIC PANEL
BUN/Creatinine Ratio: 11 (ref 10–24)
BUN: 10 mg/dL (ref 8–27)
CO2: 23 mmol/L (ref 20–29)
Calcium: 10.4 mg/dL — ABNORMAL HIGH (ref 8.6–10.2)
Chloride: 96 mmol/L (ref 96–106)
Creatinine, Ser: 0.89 mg/dL (ref 0.76–1.27)
Glucose: 108 mg/dL — ABNORMAL HIGH (ref 65–99)
Potassium: 4.1 mmol/L (ref 3.5–5.2)
Sodium: 135 mmol/L (ref 134–144)
eGFR: 93 mL/min/{1.73_m2} (ref >59)

## 2021-06-11 ENCOUNTER — Encounter: Payer: Self-pay | Admitting: Neurology

## 2021-06-11 ENCOUNTER — Other Ambulatory Visit: Payer: Self-pay | Admitting: Neurology

## 2021-06-11 MED ORDER — GABAPENTIN 100 MG PO CAPS: ORAL_CAPSULE | ORAL | 1 refills | Status: DC

## 2021-06-24 ENCOUNTER — Ambulatory Visit (INDEPENDENT_AMBULATORY_CARE_PROVIDER_SITE_OTHER): Payer: Medicare Other | Admitting: Neurology

## 2021-06-24 ENCOUNTER — Encounter: Payer: Self-pay | Admitting: Neurology

## 2021-06-24 ENCOUNTER — Ambulatory Visit: Payer: Federal, State, Local not specified - PPO | Admitting: Family Medicine

## 2021-06-24 VITALS — BP 122/77 | HR 70 | Ht 75.5 in | Wt 181.0 lb

## 2021-06-24 DIAGNOSIS — I208 Other forms of angina pectoris: Secondary | ICD-10-CM | POA: Diagnosis not present

## 2021-06-24 DIAGNOSIS — G4700 Insomnia, unspecified: Secondary | ICD-10-CM

## 2021-06-24 DIAGNOSIS — G4730 Sleep apnea, unspecified: Secondary | ICD-10-CM | POA: Diagnosis not present

## 2021-06-24 DIAGNOSIS — G4733 Obstructive sleep apnea (adult) (pediatric): Secondary | ICD-10-CM | POA: Diagnosis not present

## 2021-06-24 DIAGNOSIS — F959 Tic disorder, unspecified: Secondary | ICD-10-CM | POA: Diagnosis not present

## 2021-06-24 MED ORDER — CLONAZEPAM 0.5 MG PO TABS: ORAL_TABLET | ORAL | 5 refills | Status: DC

## 2021-06-24 MED ORDER — TIZANIDINE HCL 4 MG PO TABS: ORAL_TABLET | ORAL | 1 refills | Status: DC

## 2021-06-24 NOTE — Addendum Note (Signed)
Addended by: Larey Seat on: 06/24/2021 10:01 AM   Modules accepted: Orders
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
## 2021-06-24 NOTE — Patient Instructions (Signed)
Clonazepam Tablets What is this medication? CLONAZEPAM (kloe NA ze pam) treats seizures. It may also be used to treat panic disorder. It works by helping your nervous system calm down. It belongs to a group of medications called benzodiazepines. This medicine may be used for other purposes; ask your health care provider or pharmacist if you have questions. COMMON BRAND NAME(S): Ceberclon, Klonopin What should I tell my care team before I take this medication? They need to know if you have any of these conditions: An alcohol or drug abuse problem Bipolar disorder, depression, psychosis or other mental health condition Glaucoma Kidney or liver disease Lung or breathing disease Myasthenia gravis Parkinson disease Porphyria Seizures or a history of seizures Suicidal thoughts An unusual or allergic reaction to clonazepam, other benzodiazepines, foods, dyes, or preservatives Pregnant or trying to get pregnant Breast-feeding How should I use this medication? Take this medication by mouth with a glass of water. Follow the directions on the prescription label. If it upsets your stomach, take it with food or milk. Take your medication at regular intervals. Do not take it more often than directed. Do not stop taking or change the dose except on the advice of your care team. A special MedGuide will be given to you by the pharmacist with each prescription and refill. Be sure to read this information carefully each time. Talk to your care team regarding the use of this medication in children. Special care may be needed. Overdosage: If you think you have taken too much of this medicine contact a poison control center or emergency room at once. NOTE: This medicine is only for you. Do not share this medicine with others. What if I miss a dose? If you miss a dose, take it as soon as you can. If it is almost time for your next dose, take only that dose. Do not take double or extra doses. What may interact  with this medication? Do not take this medication with any of the following: Narcotic medications for cough Sodium oxybate This medication may also interact with the following: Alcohol Antihistamines for allergy, cough and cold Antiviral medications for HIV or AIDS Certain medications for anxiety or sleep Certain medications for depression, like amitriptyline, fluoxetine, sertraline Certain medications for fungal infections like ketoconazole and itraconazole Certain medications for seizures like carbamazepine, phenobarbital, phenytoin, primidone General anesthetics like halothane, isoflurane, methoxyflurane, propofol Local anesthetics like lidocaine, pramoxine, tetracaine Medications that relax muscles for surgery Narcotic medications for pain Phenothiazines like chlorpromazine, mesoridazine, prochlorperazine, thioridazine This list may not describe all possible interactions. Give your health care provider a list of all the medicines, herbs, non-prescription drugs, or dietary supplements you use. Also tell them if you smoke, drink alcohol, or use illegal drugs. Some items may interact with your medicine. What should I watch for while using this medication? Tell your care team if your symptoms do not start to get better or if they get worse. Do not stop taking except on your care team's advice. You may develop a severe reaction. Your care team will tell you how much medication to take. You may get drowsy or dizzy. Do not drive, use machinery, or do anything that needs mental alertness until you know how this medication affects you. To reduce the risk of dizzy and fainting spells, do not stand or sit up quickly, especially if you are an older patient. Alcohol may increase dizziness and drowsiness. Avoid alcoholic drinks. If you are taking another medication that also causes drowsiness, you may   have more side effects. Give your care team a list of all medications you use. Your care team will tell  you how much medication to take. Do not take more medication than directed. Call emergency services if you have problems breathing or unusual sleepiness. The use of this medication may increase the chance of suicidal thoughts or actions. Pay special attention to how you are responding while on this medication. Any worsening of mood, or thoughts of suicide or dying should be reported to your care team right away. What side effects may I notice from receiving this medication? Side effects that you should report to your care team as soon as possible: Allergic reactions-skin rash, itching, hives, swelling of the face, lips, tongue, or throat CNS depression-slow or shallow breathing, shortness of breath, feeling faint, dizziness, confusion, trouble staying awake Thoughts of suicide or self-harm, worsening mood, feelings of depression Side effects that usually do not require medical attention (report to your care team if they continue or are bothersome): Dizziness Drowsiness Headache This list may not describe all possible side effects. Call your doctor for medical advice about side effects. You may report side effects to FDA at 1-800-FDA-1088. Where should I keep my medication? Keep out of the reach of children and pets. This medication can be abused. Keep your medication in a safe place to protect it from theft. Do not share this medication with anyone. Selling or giving away this medication is dangerous and against the law. Store at room temperature between 15 and 30 degrees C (59 and 86 degrees F). Protect from light. Keep container tightly closed. This medication may cause accidental overdose and death if taken by other adults, children, or pets. Mix any unused medication with a substance like cat litter or coffee grounds. Then throw the medication away in a sealed container like a sealed bag or a coffee can with a lid. Do not use the medication after the expiration date. NOTE: This sheet is a  summary. It may not cover all possible information. If you have questions about this medicine, talk to your doctor, pharmacist, or health care provider.  2022 [employer] Standard (2020-12-27 12:20:04)  

## 2021-07-09 ENCOUNTER — Telehealth: Payer: Self-pay

## 2021-07-09 DIAGNOSIS — K625 Hemorrhage of anus and rectum: Secondary | ICD-10-CM | POA: Diagnosis not present

## 2021-07-09 DIAGNOSIS — D649 Anemia, unspecified: Secondary | ICD-10-CM | POA: Diagnosis not present

## 2021-07-09 NOTE — Telephone Encounter (Signed)
urse Assessment Nurse: Hassell Done, RN, Melanie Date/Time (Eastern Time): 07/09/2021 3:09:03 PM Confirm and document reason for call. If symptomatic, describe symptoms. ---Caller states he is having rectal bleeding andit has bled for about 4 days. When he sits down on the toilet the toilet is blood red. He can get the bleeding to stop with tissue and bacitracin. [facility]. he is on plavix and asa. Does the patient have any new or worsening symptoms? ---Yes Will a triage be completed? ---Yes Related visit to physician within the last 2 weeks? ---No Does the PT have any chronic conditions? (i.e. diabetes, asthma, this includes High risk factors for pregnancy, etc.) ---Yes List chronic conditions. ---heart pt with a stent Is this a behavioral health or substance abuse call? ---No Guidelines Guideline Title Affirmed Question Affirmed Notes Nurse Date/Time (Eastern Time) Rectal Bleeding SEVERE rectal bleeding (large blood clots; constant or on and off bleeding) Hassell Done, RN, Melanie 07/09/2021 3:11:31 PM Disp. Time Eilene Ghazi Time) Disposition Final User PLEASE NOTE: All timestamps contained within this report are represented as [place] Standard Time. CONFIDENTIALTY NOTICE: This fax transmission is intended only for the addressee. It contains information that is legally privileged, confidential or otherwise protected from use or disclosure. If you are not the intended recipient, you are strictly prohibited from reviewing, disclosing, copying using or disseminating any of this information or taking any action in reliance on or regarding this information. If you have received this fax in error, please notify us immediately by telephone so that we can arrange for its return to [place]. Phone: [phone], Toll-Free: [phone], Fax: [phone] Page: 2 of 2 Call Id: [id number] 07/09/2021 3:18:40 PM Go to ED Now Yes Hassell Done, RN, Donnajean Lopes Disagree/Comply  Comply Caller Understands Yes PreDisposition Call another nurse Care Advice Given Per Guideline GO TO ED NOW: * You need to be seen in the Emergency Department. * Go to the ED at ___________ [place]: * Another adult should drive. CARE ADVICE given per Rectal Bleeding (Adult) guideline. Referrals GO TO FACILITY OTHER - SPECIFY

## 2021-07-10 NOTE — Telephone Encounter (Signed)
Pt went to the ER yesterday.

## 2021-07-14 ENCOUNTER — Other Ambulatory Visit: Payer: Self-pay

## 2021-07-14 ENCOUNTER — Encounter: Payer: Self-pay | Admitting: Family Medicine

## 2021-07-14 ENCOUNTER — Ambulatory Visit (INDEPENDENT_AMBULATORY_CARE_PROVIDER_SITE_OTHER): Payer: Medicare Other | Admitting: Family Medicine

## 2021-07-14 VITALS — BP 112/72 | HR 67 | Temp 97.7°F | Resp 18 | Ht 75.5 in | Wt 177.8 lb

## 2021-07-14 DIAGNOSIS — K648 Other hemorrhoids: Secondary | ICD-10-CM | POA: Diagnosis not present

## 2021-07-14 DIAGNOSIS — Z20822 Contact with and (suspected) exposure to covid-19: Secondary | ICD-10-CM | POA: Diagnosis not present

## 2021-07-14 DIAGNOSIS — K625 Hemorrhage of anus and rectum: Secondary | ICD-10-CM | POA: Insufficient documentation

## 2021-07-14 DIAGNOSIS — I208 Other forms of angina pectoris: Secondary | ICD-10-CM

## 2021-07-14 MED ORDER — HYDROCORTISONE ACETATE 25 MG RE SUPP: 25.0000 mg | Freq: Two times a day (BID) | RECTAL | 0 refills | Status: DC
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
## 2021-07-14 NOTE — Patient Instructions (Signed)

## 2021-07-14 NOTE — Assessment & Plan Note (Signed)
anusol supp  Refer to GI  [facility] records reviewed

## 2021-07-14 NOTE — Assessment & Plan Note (Signed)
Check cbcd  Refer to gi

## 2021-07-15 LAB — CBC WITH DIFFERENTIAL/PLATELET
Basophils Absolute: 0 10*3/uL (ref 0.0–0.1)
Basophils Relative: 0.7 % (ref 0.0–3.0)
Eosinophils Absolute: 0.1 10*3/uL (ref 0.0–0.7)
Eosinophils Relative: 1.4 % (ref 0.0–5.0)
HCT: 36.8 % — ABNORMAL LOW (ref 39.0–52.0)
Hemoglobin: 12.6 g/dL — ABNORMAL LOW (ref 13.0–17.0)
Lymphocytes Relative: 33.2 % (ref 12.0–46.0)
Lymphs Abs: 1.5 10*3/uL (ref 0.7–4.0)
MCHC: 34.3 g/dL (ref 30.0–36.0)
MCV: 95 fl (ref 78.0–100.0)
Monocytes Absolute: 0.4 10*3/uL (ref 0.1–1.0)
Monocytes Relative: 7.7 % (ref 3.0–12.0)
Neutro Abs: 2.6 10*3/uL (ref 1.4–7.7)
Neutrophils Relative %: 57 % (ref 43.0–77.0)
Platelets: 168 10*3/uL (ref 150.0–400.0)
RBC: 3.88 Mil/uL — ABNORMAL LOW (ref 4.22–5.81)
RDW: 13.6 % (ref 11.5–15.5)
WBC: 4.6 10*3/uL (ref 4.0–10.5)

## 2021-07-16 ENCOUNTER — Encounter: Payer: Self-pay | Admitting: Nurse Practitioner

## 2021-07-21 ENCOUNTER — Encounter: Payer: Self-pay | Admitting: Nurse Practitioner

## 2021-07-30 MED ORDER — ATORVASTATIN CALCIUM 40 MG PO TABS: 40.0000 mg | ORAL_TABLET | Freq: Every day | ORAL | 3 refills | Status: DC

## 2021-07-31 ENCOUNTER — Other Ambulatory Visit: Payer: Self-pay | Admitting: *Deleted

## 2021-07-31 MED ORDER — ENTRESTO 49-51 MG PO TABS: 1.0000 | ORAL_TABLET | Freq: Two times a day (BID) | ORAL | 0 refills | Status: DC

## 2021-08-10 ENCOUNTER — Other Ambulatory Visit: Payer: Self-pay

## 2021-08-11 MED ORDER — ENTRESTO 49-51 MG PO TABS: 1.0000 | ORAL_TABLET | Freq: Two times a day (BID) | ORAL | 0 refills | Status: DC

## 2021-08-13 ENCOUNTER — Other Ambulatory Visit (INDEPENDENT_AMBULATORY_CARE_PROVIDER_SITE_OTHER): Payer: Medicare Other

## 2021-08-13 ENCOUNTER — Other Ambulatory Visit: Payer: Self-pay | Admitting: Family Medicine

## 2021-08-13 ENCOUNTER — Ambulatory Visit (INDEPENDENT_AMBULATORY_CARE_PROVIDER_SITE_OTHER): Payer: Medicare Other | Admitting: Nurse Practitioner

## 2021-08-13 ENCOUNTER — Encounter: Payer: Self-pay | Admitting: Nurse Practitioner

## 2021-08-13 VITALS — BP 110/60 | HR 80 | Ht 73.25 in | Wt 184.2 lb

## 2021-08-13 DIAGNOSIS — D649 Anemia, unspecified: Secondary | ICD-10-CM

## 2021-08-13 DIAGNOSIS — K648 Other hemorrhoids: Secondary | ICD-10-CM

## 2021-08-13 DIAGNOSIS — K625 Hemorrhage of anus and rectum: Secondary | ICD-10-CM | POA: Diagnosis not present

## 2021-08-13 DIAGNOSIS — Z86010 Personal history of colonic polyps: Secondary | ICD-10-CM

## 2021-08-13 DIAGNOSIS — I25118 Atherosclerotic heart disease of native coronary artery with other forms of angina pectoris: Secondary | ICD-10-CM | POA: Diagnosis not present

## 2021-08-13 LAB — CBC
HCT: 34.7 % — ABNORMAL LOW (ref 39.0–52.0)
Hemoglobin: 11.7 g/dL — ABNORMAL LOW (ref 13.0–17.0)
MCHC: 33.9 g/dL (ref 30.0–36.0)
MCV: 95.6 fl (ref 78.0–100.0)
Platelets: 162 10*3/uL (ref 150.0–400.0)
RBC: 3.63 Mil/uL — ABNORMAL LOW (ref 4.22–5.81)
RDW: 13.5 % (ref 11.5–15.5)
WBC: 4.6 10*3/uL (ref 4.0–10.5)
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
## 2021-08-13 NOTE — Patient Instructions (Addendum)
LABS:  Lab work has been ordered for you today. Our lab is located in the basement. Press "B" on the elevator. The lab is located at the first door on the left as you exit the elevator.  HEALTHCARE LAWS AND MY CHART RESULTS: Due to recent changes in healthcare laws, you may see the results of your imaging and laboratory studies on MyChart before your provider has had a chance to review them.   We understand that in some cases there may be results that are confusing or concerning to you. Not all laboratory results come back in the same time frame and the provider may be waiting for multiple results in order to interpret others.  Please give [place] 48 hours in order for your provider to thoroughly review all the results before contacting the office for clarification of your results.   RECOMMENDATIONS: Desitin: Apply a small amount to the external and internal anal area three times a day as needed. Benefiber- 1 tablespoon daily as tolerated. Miralax- Dissolve one capful in 8 ounces of water and drink before bed as needed to avoid straining.  Further recommendations to be determined after test results have been reviewed. Please call our office if your rectal bleeding recurs.  It was great seeing you today! Thank you for entrusting me with your care and choosing [facility].  Noralyn Pick, CRNP  The [place] [facility] providers would like to encourage you to use [facility] to communicate with providers for non-urgent requests or questions.  Due to long hold times on the telephone, sending your provider a message by [facility] may be faster and more efficient way to get a response. Please allow 48 business hours for a response.  Please remember that this is for non-urgent requests/questions.  If you are age 35 or older, your body mass index should be between 23-30. Your Body mass index is 24.14 kg/m. If this is out of the aforementioned range listed, please consider follow up with your Primary  Care Provider.  If you are age 63 or younger, your body mass index should be between 19-25. Your Body mass index is 24.14 kg/m. If this is out of the aformentioned range listed, please consider follow up with your Primary Care Provider.

## 2021-08-14 DIAGNOSIS — Z20822 Contact with and (suspected) exposure to covid-19: Secondary | ICD-10-CM | POA: Diagnosis not present

## 2021-08-14 LAB — IRON,TIBC AND FERRITIN PANEL
%SAT: 35 % (calc) (ref 20–48)
Ferritin: 73 ng/mL (ref 24–380)
Iron: 123 ug/dL (ref 50–180)
TIBC: 350 mcg/dL (calc) (ref 250–425)

## 2021-08-14 NOTE — Progress Notes (Signed)
Colleen, Given obvious, and resolved, hemorrhoidal bleeding, keep original surveillance date of around 03-2022. Thanks  Dr. Henrene Pastor

## 2021-08-15 NOTE — Telephone Encounter (Signed)
Do you want to refill?  He saw gastro 8/31 and this was plan:   ASSESSMENT AND PLAN:   84) 70 year old male with rectal bleeding which occurred after straining to pass a BM associated with a prolapsed internal hemorrhoid.  Rectal bleeding resolved after utilizing Anusol HC suppository for 3-4 nights.  Hg level 12.6 which is his baseline level.  -Benefiber as tolerated to avoid straining -MiraLAX nightly as needed -Apply a small amount of Desitin inside the anal opening and to the external anal area tid as needed for anal or hemorrhoidal irritation/bleeding.  -Patient to call our office if rectal bleeding recurs, consider future internal hemorrhoid banding if hemorrhoidal bleeding persists or worsens

## 2021-08-20 ENCOUNTER — Ambulatory Visit (INDEPENDENT_AMBULATORY_CARE_PROVIDER_SITE_OTHER): Payer: Medicare Other

## 2021-08-20 VITALS — Ht 75.5 in | Wt 184.0 lb

## 2021-08-20 DIAGNOSIS — Z0000 Encounter for general adult medical examination without abnormal findings: Secondary | ICD-10-CM

## 2021-08-20 NOTE — Patient Instructions (Signed)
[name] , Thank you for taking time to complete your Medicare Wellness Visit. I appreciate your ongoing commitment to your health goals. Please review the following plan we discussed and let me know if I can assist you in the future.   Screening recommendations/referrals: Colonoscopy: Completed 03/31/2017-Due 03/31/2022 Recommended yearly ophthalmology/optometry visit for glaucoma screening and checkup Recommended yearly dental visit for hygiene and checkup  Vaccinations: Influenza vaccine: Declined Pneumococcal vaccine: Up to date Tdap vaccine: Up to date-Due-06/10/2022 Shingles vaccine: Completed vaccines   Covid-19: Declined  Advanced directives: Information mailed today  Conditions/risks identified: See problem list  Next appointment: Follow up in one year for your annual wellness visit. 08/27/2022 @ 3:00  Preventive Care 69 Years and Older, Male Preventive care refers to lifestyle choices and visits with your health care provider that can promote health and wellness. What does preventive care include? A yearly physical exam. This is also called an annual well check. Dental exams once or twice a year. Routine eye exams. Ask your health care provider how often you should have your eyes checked. Personal lifestyle choices, including: Daily care of your teeth and gums. Regular physical activity. Eating a healthy diet. Avoiding tobacco and drug use. Limiting alcohol use. Practicing safe sex. Taking low doses of aspirin every day. Taking vitamin and mineral supplements as recommended by your health care provider. What happens during an annual well check? The services and screenings done by your health care provider during your annual well check will depend on your age, overall health, lifestyle risk factors, and family history of disease. Counseling  Your health care provider may ask you questions about your: Alcohol use. Tobacco use. Drug use. Emotional well-being. Home and  relationship well-being. Sexual activity. Eating habits. History of falls. Memory and ability to understand (cognition). Work and work Statistician. Screening  You may have the following tests or measurements: Height, weight, and BMI. Blood pressure. Lipid and cholesterol levels. These may be checked every 5 years, or more frequently if you are over 24 years old. Skin check. Lung cancer screening. You may have this screening every year starting at age 53 if you have a 30-pack-year history of smoking and currently smoke or have quit within the past 15 years. Fecal occult blood test (FOBT) of the stool. You may have this test every year starting at age 2. Flexible sigmoidoscopy or colonoscopy. You may have a sigmoidoscopy every 5 years or a colonoscopy every 10 years starting at age 27. Prostate cancer screening. Recommendations will vary depending on your family history and other risks. Hepatitis C blood test. Hepatitis B blood test. Sexually transmitted disease (STD) testing. Diabetes screening. This is done by checking your blood sugar (glucose) after you have not eaten for a while (fasting). You may have this done every 1-3 years. Abdominal aortic aneurysm (AAA) screening. You may need this if you are a current or former smoker. Osteoporosis. You may be screened starting at age 43 if you are at high risk. Talk with your health care provider about your test results, treatment options, and if necessary, the need for more tests. Vaccines  Your health care provider may recommend certain vaccines, such as: Influenza vaccine. This is recommended every year. Tetanus, diphtheria, and acellular pertussis (Tdap, Td) vaccine. You may need a Td booster every 10 years. Zoster vaccine. You may need this after age 30. Pneumococcal 13-valent conjugate (PCV13) vaccine. One dose is recommended after age 74. Pneumococcal polysaccharide (PPSV23) vaccine. One dose is recommended after age 59.  Talk to your  health care provider about which screenings and vaccines you need and how often you need them. This information is not intended to replace advice given to you by your health care provider. Make sure you discuss any questions you have with your health care provider. Document Released: 12/27/2015 Document Revised: 08/19/2016 Document Reviewed: 10/01/2015 Elsevier Interactive Patient Education  2017 [employer] Prevention in the Home Falls can cause injuries. They can happen to people of all ages. There are many things you can do to make your home safe and to help prevent falls. What can I do on the outside of my home? Regularly fix the edges of walkways and driveways and fix any cracks. Remove anything that might make you trip as you walk through a door, such as a raised step or threshold. Trim any bushes or trees on the path to your home. Use bright outdoor lighting. Clear any walking paths of anything that might make someone trip, such as rocks or tools. Regularly check to see if handrails are loose or broken. Make sure that both sides of any steps have handrails. Any raised decks and porches should have guardrails on the edges. Have any leaves, snow, or ice cleared regularly. Use sand or salt on walking paths during winter. Clean up any spills in your garage right away. This includes oil or grease spills. What can I do in the bathroom? Use night lights. Install grab bars by the toilet and in the tub and shower. Do not use towel bars as grab bars. Use non-skid mats or decals in the tub or shower. If you need to sit down in the shower, use a plastic, non-slip stool. Keep the floor dry. Clean up any water that spills on the floor as soon as it happens. Remove soap buildup in the tub or shower regularly. Attach bath mats securely with double-sided non-slip rug tape. Do not have throw rugs and other things on the floor that can make you trip. What can I do in the bedroom? Use night  lights. Make sure that you have a light by your bed that is easy to reach. Do not use any sheets or blankets that are too big for your bed. They should not hang down onto the floor. Have a firm chair that has side arms. You can use this for support while you get dressed. Do not have throw rugs and other things on the floor that can make you trip. What can I do in the kitchen? Clean up any spills right away. Avoid walking on wet floors. Keep items that you use a lot in easy-to-reach places. If you need to reach something above you, use a strong step stool that has a grab bar. Keep electrical cords out of the way. Do not use floor polish or wax that makes floors slippery. If you must use wax, use non-skid floor wax. Do not have throw rugs and other things on the floor that can make you trip. What can I do with my stairs? Do not leave any items on the stairs. Make sure that there are handrails on both sides of the stairs and use them. Fix handrails that are broken or loose. Make sure that handrails are as long as the stairways. Check any carpeting to make sure that it is firmly attached to the stairs. Fix any carpet that is loose or worn. Avoid having throw rugs at the top or bottom of the stairs. If you do have throw  rugs, attach them to the floor with carpet tape. Make sure that you have a light switch at the top of the stairs and the bottom of the stairs. If you do not have them, ask someone to add them for you. What else can I do to help prevent falls? Wear shoes that: Do not have high heels. Have rubber bottoms. Are comfortable and fit you well. Are closed at the toe. Do not wear sandals. If you use a stepladder: Make sure that it is fully opened. Do not climb a closed stepladder. Make sure that both sides of the stepladder are locked into place. Ask someone to hold it for you, if possible. Clearly mark and make sure that you can see: Any grab bars or handrails. First and last  steps. Where the [place] of each step is. Use tools that help you move around (mobility aids) if they are needed. These include: Canes. Walkers. Scooters. Crutches. Turn on the lights when you go into a dark area. Replace any light bulbs as soon as they burn out. Set up your furniture so you have a clear path. Avoid moving your furniture around. If any of your floors are uneven, fix them. If there are any pets around you, be aware of where they are. Review your medicines with your doctor. Some medicines can make you feel dizzy. This can increase your chance of falling. Ask your doctor what other things that you can do to help prevent falls. This information is not intended to replace advice given to you by your health care provider. Make sure you discuss any questions you have with your health care provider. Document Released: 09/26/2009 Document Revised: 05/07/2016 Document Reviewed: 01/04/2015 Elsevier Interactive Patient Education  2017 [employer].
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
## 2021-09-25 ENCOUNTER — Other Ambulatory Visit: Payer: Self-pay | Admitting: Neurology

## 2021-10-08 ENCOUNTER — Emergency Department (HOSPITAL_COMMUNITY): Payer: Medicare Other

## 2021-10-08 ENCOUNTER — Ambulatory Visit: Payer: Medicare Other | Admitting: Physician Assistant

## 2021-10-08 ENCOUNTER — Emergency Department (HOSPITAL_COMMUNITY)
Admission: EM | Admit: 2021-10-08 | Discharge: 2021-10-08 | Disposition: A | Discharge status: Home / Self Care | Payer: Medicare Other | Attending: Emergency Medicine | Admitting: Emergency Medicine

## 2021-10-08 ENCOUNTER — Encounter (HOSPITAL_COMMUNITY): Payer: Self-pay

## 2021-10-08 ENCOUNTER — Other Ambulatory Visit: Payer: Self-pay

## 2021-10-08 DIAGNOSIS — R202 Paresthesia of skin: Secondary | ICD-10-CM | POA: Insufficient documentation

## 2021-10-08 DIAGNOSIS — I2510 Atherosclerotic heart disease of native coronary artery without angina pectoris: Secondary | ICD-10-CM | POA: Insufficient documentation

## 2021-10-08 DIAGNOSIS — Z87891 Personal history of nicotine dependence: Secondary | ICD-10-CM | POA: Insufficient documentation

## 2021-10-08 DIAGNOSIS — I959 Hypotension, unspecified: Secondary | ICD-10-CM | POA: Diagnosis not present

## 2021-10-08 DIAGNOSIS — I509 Heart failure, unspecified: Secondary | ICD-10-CM | POA: Diagnosis not present

## 2021-10-08 DIAGNOSIS — I110 Hypertensive heart disease with heart failure: Secondary | ICD-10-CM | POA: Insufficient documentation

## 2021-10-08 DIAGNOSIS — R079 Chest pain, unspecified: Secondary | ICD-10-CM | POA: Diagnosis not present

## 2021-10-08 DIAGNOSIS — Z79899 Other long term (current) drug therapy: Secondary | ICD-10-CM | POA: Diagnosis not present

## 2021-10-08 DIAGNOSIS — Z7982 Long term (current) use of aspirin: Secondary | ICD-10-CM | POA: Diagnosis not present

## 2021-10-08 DIAGNOSIS — R001 Bradycardia, unspecified: Secondary | ICD-10-CM | POA: Diagnosis not present

## 2021-10-08 DIAGNOSIS — R072 Precordial pain: Secondary | ICD-10-CM | POA: Insufficient documentation

## 2021-10-08 DIAGNOSIS — Z955 Presence of coronary angioplasty implant and graft: Secondary | ICD-10-CM | POA: Insufficient documentation

## 2021-10-08 DIAGNOSIS — R0789 Other chest pain: Secondary | ICD-10-CM | POA: Diagnosis not present

## 2021-10-08 LAB — BASIC METABOLIC PANEL
Anion gap: 7 (ref 5–15)
BUN: 12 mg/dL (ref 8–23)
CO2: 25 mmol/L (ref 22–32)
Calcium: 8.8 mg/dL — ABNORMAL LOW (ref 8.9–10.3)
Chloride: 105 mmol/L (ref 98–111)
Creatinine, Ser: 0.89 mg/dL (ref 0.61–1.24)
GFR, Estimated: >60 mL/min (ref >60)
Glucose, Bld: 113 mg/dL — ABNORMAL HIGH (ref 70–99)
Potassium: 3.8 mmol/L (ref 3.5–5.1)
Sodium: 137 mmol/L (ref 135–145)

## 2021-10-08 LAB — TROPONIN I (HIGH SENSITIVITY)
Troponin I (High Sensitivity): 8 ng/L (ref <18)
Troponin I (High Sensitivity): 6 ng/L (ref <18)

## 2021-10-08 MED ORDER — NITROGLYCERIN 0.4 MG SL SUBL: 0.4000 mg | SUBLINGUAL_TABLET | SUBLINGUAL | 11 refills | Status: AC | PRN

## 2021-10-08 NOTE — ED Provider Notes (Signed)
I provided a substantive portion of the care of this patient.  I personally performed the entirety of the history, exam, and medical decision making for this encounter.  EKG Interpretation  Date/Time:  Wednesday October 08 2021 11:30:27 EDT Ventricular Rate:  50 PR Interval:  274 QRS Duration: 98 QT Interval:  504 QTC Calculation: 460 R Axis:   32 Text Interpretation: Sinus or ectopic atrial rhythm Prolonged PR interval Abnormal R-wave progression, early transition Borderline T abnormalities, anterior leads No significant change since last tracing Confirmed by Fredia Sorrow [phone]) on 10/08/2021 11:35:41 AM  Patient followed by cardiology.  Actually had follow-up appointment this afternoon.  Patient also had follow-up cardiac catheterization in January.  Patient's troponins x2 here today were 8 and then 6.  Still very stable.  Basic metabolic panel without any acute findings.  Chest x-ray without any acute call cardiopulmonary findings.  EKG without any significant changes compared to last EKG.  Patient came in by EMS from home chest pain that woke him from sleep.  Took 3 nitro at home EMS gave him 324 mg of aspirin.  Patient's had past MI coronary artery bypass surgery and stents in the past.  Patient with left-sided chest pain that radiated to the left arm is associated with numbness.  With the nitroglycerin pain resolved but the numbers did not.  Patient currently being seen by Dr. Burt Knack from cardiology.  He concurs that patient is stable for discharge home since to negative troponins.  And they will rearrange follow-up with cardiology  Physical exam significant for patient in no acute distress.     Fredia Sorrow, MD 10/08/21 1447
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
## 2021-10-08 NOTE — Discharge Instructions (Addendum)
Please read and follow all provided instructions.  Your diagnoses today include:  1. Precordial pain     Tests performed today include: Basic metabolic panel: No concerns  Cardiac enzymes (blood test looking for stress on the heart): No sign of heart attack EKG: Unchanged from previous Chest x-ray: No problems Vital signs. See below for your results today.   Medications prescribed:  None  Take any prescribed medications only as directed.  Follow-up instructions: Please follow-up with your cardiologist as directed today.   Return instructions:  SEEK IMMEDIATE MEDICAL ATTENTION IF: You have severe chest pain, especially if the pain is crushing or pressure-like and spreads to the arms, back, neck, or jaw, or if you have sweating, nausea (feeling sick to your stomach), or shortness of breath. THIS IS AN EMERGENCY. Don't wait to see if the pain will go away. Get medical help at once. Call 911 or [phone] (operator). DO NOT drive yourself to the hospital.  Your chest pain gets worse and does not go away with rest.  You have an attack of chest pain lasting longer than usual, despite rest and treatment with the medications your caregiver has prescribed.  You wake from sleep with chest pain or shortness of breath. You feel dizzy or faint. You have chest pain not typical of your usual pain for which you originally saw your caregiver.  You have any other emergent concerns regarding your health.  Additional Information: Chest pain comes from many different causes. Your caregiver has diagnosed you as having chest pain that is not specific for one problem, but does not require admission.  You are at low risk for an acute heart condition or other serious illness.   Your vital signs today were: BP 135/78   Pulse (!) 54   Temp 98.7 F (37.1 C) (Oral)   Resp 12   Ht 6' (1.829 m)   Wt 83.5 kg   SpO2 99%   BMI 24.95 kg/m  If your blood pressure (BP) was elevated above 135/85 this visit, please have  this repeated by your doctor within one month. --------------
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
## 2021-10-08 NOTE — ED Triage Notes (Signed)
[facility] reports pt coming from home c/o chest pain that woke him from sleep. Pt took 3 nitro at home. EMS gave 324mg  ASA. Pt w/MI, bypass,stents in past.

## 2021-11-14 ENCOUNTER — Other Ambulatory Visit: Payer: Self-pay | Admitting: Physician Assistant

## 2021-11-17 DIAGNOSIS — I77810 Thoracic aortic ectasia: Secondary | ICD-10-CM | POA: Insufficient documentation
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
## 2021-11-18 ENCOUNTER — Ambulatory Visit: Payer: Medicare Other | Admitting: Physician Assistant

## 2021-11-19 ENCOUNTER — Telehealth: Payer: Self-pay | Admitting: Family Medicine

## 2021-11-19 NOTE — Telephone Encounter (Signed)
Noted  

## 2021-11-19 NOTE — Telephone Encounter (Signed)
[facility] called stating they will be faxing over a form for a Wrist Brace.

## 2021-11-21 ENCOUNTER — Other Ambulatory Visit: Payer: Self-pay

## 2021-11-21 ENCOUNTER — Other Ambulatory Visit: Payer: Self-pay | Admitting: Family Medicine

## 2021-11-21 ENCOUNTER — Encounter: Payer: Self-pay | Admitting: Family Medicine

## 2021-11-21 ENCOUNTER — Telehealth (INDEPENDENT_AMBULATORY_CARE_PROVIDER_SITE_OTHER): Payer: Medicare Other | Admitting: Family Medicine

## 2021-11-21 VITALS — BP 143/73 | HR 64 | Ht 75.0 in | Wt 177.0 lb

## 2021-11-21 DIAGNOSIS — I10 Essential (primary) hypertension: Secondary | ICD-10-CM

## 2021-11-21 DIAGNOSIS — E785 Hyperlipidemia, unspecified: Secondary | ICD-10-CM | POA: Diagnosis not present

## 2021-11-21 DIAGNOSIS — G4733 Obstructive sleep apnea (adult) (pediatric): Secondary | ICD-10-CM

## 2021-11-21 DIAGNOSIS — K648 Other hemorrhoids: Secondary | ICD-10-CM

## 2021-11-21 DIAGNOSIS — Z9989 Dependence on other enabling machines and devices: Secondary | ICD-10-CM

## 2021-11-21 DIAGNOSIS — I2510 Atherosclerotic heart disease of native coronary artery without angina pectoris: Secondary | ICD-10-CM | POA: Diagnosis not present

## 2021-11-21 DIAGNOSIS — I208 Other forms of angina pectoris: Secondary | ICD-10-CM | POA: Diagnosis not present

## 2021-11-21 NOTE — Assessment & Plan Note (Signed)
Check labs  On statin

## 2021-11-21 NOTE — Assessment & Plan Note (Signed)
Per neuro 

## 2021-11-21 NOTE — Assessment & Plan Note (Signed)
Well controlled, no changes to meds. Encouraged heart healthy diet such as the DASH diet and exercise as tolerated.  °
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
## 2021-11-21 NOTE — Assessment & Plan Note (Signed)
Tolerating statin, encouraged heart healthy diet, avoid trans fats, minimize simple carbs and saturated fats. Increase exercise as tolerated 

## 2021-11-26 NOTE — Telephone Encounter (Signed)
[place] called again to see if form was filled out. Please advise. phone # [phone] fax# [phone]

## 2021-11-27 ENCOUNTER — Encounter: Payer: Self-pay | Admitting: Nurse Practitioner

## 2021-11-27 ENCOUNTER — Other Ambulatory Visit: Payer: Self-pay

## 2021-11-27 ENCOUNTER — Ambulatory Visit (INDEPENDENT_AMBULATORY_CARE_PROVIDER_SITE_OTHER): Payer: Medicare Other | Admitting: Nurse Practitioner

## 2021-11-27 ENCOUNTER — Telehealth: Payer: Self-pay | Admitting: Family Medicine

## 2021-11-27 VITALS — BP 138/68 | HR 62 | Ht 75.0 in | Wt 177.0 lb

## 2021-11-27 DIAGNOSIS — I7143 Infrarenal abdominal aortic aneurysm, without rupture: Secondary | ICD-10-CM | POA: Diagnosis not present

## 2021-11-27 DIAGNOSIS — I5020 Unspecified systolic (congestive) heart failure: Secondary | ICD-10-CM | POA: Diagnosis not present

## 2021-11-27 DIAGNOSIS — I2510 Atherosclerotic heart disease of native coronary artery without angina pectoris: Secondary | ICD-10-CM

## 2021-11-27 DIAGNOSIS — I208 Other forms of angina pectoris: Secondary | ICD-10-CM

## 2021-11-27 NOTE — Patient Instructions (Addendum)
Medication Instructions:  Your physician recommends that you continue on your current medications as directed. Please refer to the Current Medication list given to you today.  *If you need a refill on your cardiac medications before your next appointment, please call your pharmacy*   Lab Work: None ordered  If you have labs (blood work) drawn today and your tests are completely normal, you will receive your results only by: [place] (if you have MyChart) OR A paper copy in the mail If you have any lab test that is abnormal or we need to change your treatment, we will call you to review the results.   Testing/Procedures: None ordered   Follow-Up: At [facility], you and your health needs are our priority.  As part of our continuing mission to provide you with exceptional heart care, we have created designated Provider Care Teams.  These Care Teams include your primary Cardiologist (physician) and Advanced Practice Providers (APPs -  Physician Assistants and Nurse Practitioners) who all work together to provide you with the care you need, when you need it.  We recommend signing up for the patient portal called "MyChart".  Sign up information is provided on this After Visit Summary.  MyChart is used to connect with patients for Virtual Visits (Telemedicine).  Patients are able to view lab/test results, encounter notes, upcoming appointments, etc.  Non-urgent messages can be sent to your provider as well.   To learn more about what you can do with MyChart, go to [email or web address].    Your next appointment:   6 month(s)  The format for your next appointment:   In Person  Provider:   Sherren Mocha, MD  or [name], PA-C         Other Instructions

## 2021-11-27 NOTE — Telephone Encounter (Signed)
Called patient to get him scheduled for labs per lowne

## 2021-11-27 NOTE — Telephone Encounter (Signed)
[employer] called again to check on papers faxed over to [place], he was advised to fax it over again. Fax number was verified.
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
## 2021-11-27 NOTE — Telephone Encounter (Signed)
-----   Message from Ann Held, DO sent at 11/21/2021  1:47 PM EST ----- Needs lab app

## 2021-11-28 NOTE — Telephone Encounter (Signed)
[place] called to check on fax, they were informed fax was received and to call back in a few days.

## 2021-11-28 NOTE — Telephone Encounter (Signed)
Received

## 2021-11-29 DIAGNOSIS — Z20828 Contact with and (suspected) exposure to other viral communicable diseases: Secondary | ICD-10-CM | POA: Diagnosis not present

## 2021-12-03 ENCOUNTER — Other Ambulatory Visit (INDEPENDENT_AMBULATORY_CARE_PROVIDER_SITE_OTHER): Payer: Medicare Other

## 2021-12-03 DIAGNOSIS — E785 Hyperlipidemia, unspecified: Secondary | ICD-10-CM | POA: Diagnosis not present

## 2021-12-03 DIAGNOSIS — I10 Essential (primary) hypertension: Secondary | ICD-10-CM

## 2021-12-03 LAB — COMPREHENSIVE METABOLIC PANEL
ALT: 23 U/L (ref 0–53)
AST: 19 U/L (ref 0–37)
Albumin: 4.7 g/dL (ref 3.5–5.2)
Alkaline Phosphatase: 76 U/L (ref 39–117)
BUN: 12 mg/dL (ref 6–23)
CO2: 31 mEq/L (ref 19–32)
Calcium: 10.1 mg/dL (ref 8.4–10.5)
Chloride: 103 mEq/L (ref 96–112)
Creatinine, Ser: 0.93 mg/dL (ref 0.40–1.50)
GFR: 83.6 mL/min (ref >60.00)
Glucose, Bld: 83 mg/dL (ref 70–99)
Potassium: 4 mEq/L (ref 3.5–5.1)
Sodium: 141 mEq/L (ref 135–145)
Total Bilirubin: 0.7 mg/dL (ref 0.2–1.2)
Total Protein: 7.2 g/dL (ref 6.0–8.3)

## 2021-12-03 LAB — CBC WITH DIFFERENTIAL/PLATELET
Basophils Absolute: 0 10*3/uL (ref 0.0–0.1)
Basophils Relative: 0.9 % (ref 0.0–3.0)
Eosinophils Absolute: 0.1 10*3/uL (ref 0.0–0.7)
Eosinophils Relative: 2.1 % (ref 0.0–5.0)
HCT: 38.6 % — ABNORMAL LOW (ref 39.0–52.0)
Hemoglobin: 12.9 g/dL — ABNORMAL LOW (ref 13.0–17.0)
Lymphocytes Relative: 46.7 % — ABNORMAL HIGH (ref 12.0–46.0)
Lymphs Abs: 1.9 10*3/uL (ref 0.7–4.0)
MCHC: 33.4 g/dL (ref 30.0–36.0)
MCV: 95.7 fl (ref 78.0–100.0)
Monocytes Absolute: 0.4 10*3/uL (ref 0.1–1.0)
Monocytes Relative: 9.1 % (ref 3.0–12.0)
Neutro Abs: 1.7 10*3/uL (ref 1.4–7.7)
Neutrophils Relative %: 41.2 % — ABNORMAL LOW (ref 43.0–77.0)
Platelets: 185 10*3/uL (ref 150.0–400.0)
RBC: 4.03 Mil/uL — ABNORMAL LOW (ref 4.22–5.81)
RDW: 13.1 % (ref 11.5–15.5)
WBC: 4.1 10*3/uL (ref 4.0–10.5)

## 2021-12-03 LAB — LIPID PANEL
Cholesterol: 132 mg/dL (ref 0–200)
HDL: 54.8 mg/dL (ref >39.00)
LDL Cholesterol: 59 mg/dL (ref 0–99)
NonHDL: 77.27
Total CHOL/HDL Ratio: 2
Triglycerides: 89 mg/dL (ref 0.0–149.0)
VLDL: 17.8 mg/dL (ref 0.0–40.0)

## 2021-12-07 ENCOUNTER — Other Ambulatory Visit: Payer: Self-pay | Admitting: Neurology

## 2021-12-23 ENCOUNTER — Other Ambulatory Visit: Payer: Self-pay

## 2021-12-23 MED ORDER — TIZANIDINE HCL 4 MG PO TABS: ORAL_TABLET | ORAL | 1 refills | Status: DC

## 2021-12-25 ENCOUNTER — Telehealth (INDEPENDENT_AMBULATORY_CARE_PROVIDER_SITE_OTHER): Payer: Medicare Other | Admitting: Adult Health

## 2021-12-25 DIAGNOSIS — G4730 Sleep apnea, unspecified: Secondary | ICD-10-CM

## 2021-12-25 DIAGNOSIS — Z9989 Dependence on other enabling machines and devices: Secondary | ICD-10-CM

## 2021-12-25 DIAGNOSIS — G4733 Obstructive sleep apnea (adult) (pediatric): Secondary | ICD-10-CM | POA: Diagnosis not present

## 2021-12-25 DIAGNOSIS — G4700 Insomnia, unspecified: Secondary | ICD-10-CM | POA: Diagnosis not present

## 2021-12-25 MED ORDER — GABAPENTIN 100 MG PO CAPS: ORAL_CAPSULE | ORAL | 1 refills | Status: DC

## 2021-12-25 MED ORDER — TIZANIDINE HCL 4 MG PO TABS: ORAL_TABLET | ORAL | 1 refills | Status: DC

## 2021-12-25 MED ORDER — CLONAZEPAM 0.5 MG PO TABS: ORAL_TABLET | ORAL | 5 refills | Status: DC

## 2021-12-25 MED ORDER — MIRTAZAPINE 45 MG PO TABS: 45.0000 mg | ORAL_TABLET | Freq: Every day | ORAL | 3 refills | Status: DC
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
## 2022-01-06 NOTE — Telephone Encounter (Signed)
[facility] [facility] calling again and states they still have not received fax. F# [phone].

## 2022-01-06 NOTE — Telephone Encounter (Signed)
Still have not received any forms for this concern. Attempted to call patient to verify this was something he needed. LVM to return call

## 2022-01-30 ENCOUNTER — Other Ambulatory Visit: Payer: Self-pay | Admitting: Cardiovascular Disease

## 2022-03-19 ENCOUNTER — Encounter: Payer: Self-pay | Admitting: Adult Health

## 2022-03-24 MED ORDER — CLONAZEPAM 0.5 MG PO TABS: ORAL_TABLET | ORAL | 5 refills | Status: DC

## 2022-04-06 DIAGNOSIS — Z20822 Contact with and (suspected) exposure to covid-19: Secondary | ICD-10-CM | POA: Diagnosis not present

## 2022-04-21 ENCOUNTER — Telehealth: Payer: Self-pay

## 2022-04-21 NOTE — Telephone Encounter (Signed)
Spoke with patient and advised him that he will no longer be able to see Dr. Carollee Herter. Patient expressed his understanding and knows that he can receive services with another [facility] primary care provider, and that this was an exception being made for him.   ?

## 2022-05-01 ENCOUNTER — Encounter: Payer: Self-pay | Admitting: Internal Medicine

## 2022-05-11 ENCOUNTER — Other Ambulatory Visit: Payer: Self-pay | Admitting: Cardiovascular Disease

## 2022-05-15 DIAGNOSIS — R972 Elevated prostate specific antigen [PSA]: Secondary | ICD-10-CM | POA: Diagnosis not present

## 2022-05-16 ENCOUNTER — Other Ambulatory Visit: Payer: Self-pay | Admitting: Physician Assistant

## 2022-05-18 ENCOUNTER — Encounter: Payer: Self-pay | Admitting: Cardiovascular Disease

## 2022-05-18 ENCOUNTER — Other Ambulatory Visit: Payer: Self-pay | Admitting: Cardiovascular Disease

## 2022-05-18 ENCOUNTER — Ambulatory Visit (INDEPENDENT_AMBULATORY_CARE_PROVIDER_SITE_OTHER): Payer: Medicare Other | Admitting: Cardiovascular Disease

## 2022-05-18 ENCOUNTER — Other Ambulatory Visit: Payer: Self-pay | Admitting: Family Medicine

## 2022-05-18 VITALS — BP 100/66 | HR 56 | Ht 75.0 in | Wt 170.8 lb

## 2022-05-18 DIAGNOSIS — I5020 Unspecified systolic (congestive) heart failure: Secondary | ICD-10-CM | POA: Diagnosis not present

## 2022-05-18 DIAGNOSIS — K648 Other hemorrhoids: Secondary | ICD-10-CM

## 2022-05-18 DIAGNOSIS — I7143 Infrarenal abdominal aortic aneurysm, without rupture: Secondary | ICD-10-CM | POA: Diagnosis not present

## 2022-05-18 DIAGNOSIS — E782 Mixed hyperlipidemia: Secondary | ICD-10-CM | POA: Diagnosis not present

## 2022-05-18 DIAGNOSIS — I10 Essential (primary) hypertension: Secondary | ICD-10-CM | POA: Diagnosis not present

## 2022-05-18 DIAGNOSIS — I2510 Atherosclerotic heart disease of native coronary artery without angina pectoris: Secondary | ICD-10-CM | POA: Diagnosis not present

## 2022-05-18 MED ORDER — HYDROCORTISONE (PERIANAL) 2.5 % EX CREA: 1.0000 "application " | TOPICAL_CREAM | Freq: Two times a day (BID) | CUTANEOUS | 0 refills | Status: DC

## 2022-05-18 MED ORDER — ENTRESTO 49-51 MG PO TABS: 1.0000 | ORAL_TABLET | Freq: Two times a day (BID) | ORAL | 3 refills | Status: DC

## 2022-05-18 NOTE — Patient Instructions (Addendum)
Medication Instructions:  Refill Anusol HC cream Your physician recommends that you continue on your current medications as directed. Please refer to the Current Medication list given to you today.  *If you need a refill on your cardiac medications before your next appointment, please call your pharmacy*   Lab Work: NONE If you have labs (blood work) drawn today and your tests are completely normal, you will receive your results only by: [place] (if you have MyChart) OR A paper copy in the mail If you have any lab test that is abnormal or we need to change your treatment, we will call you to review the results.  Testing/Procedures: [place] Your physician has requested that you have a lexiscan myoview. For further information please visit [email or web address]. Please follow instruction sheet, as given.  Follow-Up: At [facility], you and your health needs are our priority.  As part of our continuing mission to provide you with exceptional heart care, we have created designated Provider Care Teams.  These Care Teams include your primary Cardiologist (physician) and Advanced Practice Providers (APPs -  Physician Assistants and Nurse Practitioners) who all work together to provide you with the care you need, when you need it.  Your next appointment:   6 month(s)  The format for your next appointment:   In Person  Provider:   Robbie Lis, PA-C, Christen Bame, NP, or [name], PA-C     Then, Sherren Mocha, MD will plan to see you again in 1 year(s).   Other Instructions See instruction sheet for stress test instructions  Important Information About Sugar
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
## 2022-05-19 DIAGNOSIS — R972 Elevated prostate specific antigen [PSA]: Secondary | ICD-10-CM | POA: Diagnosis not present

## 2022-05-19 DIAGNOSIS — N403 Nodular prostate with lower urinary tract symptoms: Secondary | ICD-10-CM | POA: Diagnosis not present

## 2022-05-19 DIAGNOSIS — R351 Nocturia: Secondary | ICD-10-CM | POA: Diagnosis not present

## 2022-05-25 ENCOUNTER — Other Ambulatory Visit (HOSPITAL_COMMUNITY): Payer: Self-pay | Admitting: Physician Assistant

## 2022-05-25 DIAGNOSIS — I714 Abdominal aortic aneurysm, without rupture, unspecified: Secondary | ICD-10-CM

## 2022-05-27 ENCOUNTER — Other Ambulatory Visit (HOSPITAL_COMMUNITY): Payer: Medicare Other

## 2022-05-28 ENCOUNTER — Ambulatory Visit (HOSPITAL_COMMUNITY)
Admission: RE | Admit: 2022-05-28 | Discharge: 2022-05-28 | Disposition: A | Discharge status: Home / Self Care | Payer: Medicare Other | Source: Ambulatory Visit | Attending: Internal Medicine | Admitting: Internal Medicine

## 2022-05-28 DIAGNOSIS — I714 Abdominal aortic aneurysm, without rupture, unspecified: Secondary | ICD-10-CM

## 2022-05-28 DIAGNOSIS — I7142 Juxtarenal abdominal aortic aneurysm, without rupture: Secondary | ICD-10-CM

## 2022-05-29 ENCOUNTER — Telehealth (HOSPITAL_COMMUNITY): Payer: Self-pay | Admitting: *Deleted

## 2022-05-29 ENCOUNTER — Encounter: Payer: Self-pay | Admitting: Physician Assistant

## 2022-05-29 ENCOUNTER — Other Ambulatory Visit: Payer: Self-pay | Admitting: *Deleted

## 2022-05-29 DIAGNOSIS — I714 Abdominal aortic aneurysm, without rupture, unspecified: Secondary | ICD-10-CM

## 2022-05-29 NOTE — Progress Notes (Signed)
AAA ultrasound to be done in December 2023

## 2022-05-29 NOTE — Telephone Encounter (Signed)
Patient given detailed instructions per Myocardial Perfusion Study Information Sheet for the test on 06/05/2022 at 10:00. Patient notified to arrive 15 minutes early and that it is imperative to arrive on time for appointment to keep from having the test rescheduled.  If you need to cancel or reschedule your appointment, please call the office within 24 hours of your appointment. . Patient verbalized understanding.[name]

## 2022-06-03 ENCOUNTER — Encounter (HOSPITAL_BASED_OUTPATIENT_CLINIC_OR_DEPARTMENT_OTHER): Payer: Self-pay | Admitting: Family Medicine

## 2022-06-03 ENCOUNTER — Ambulatory Visit (INDEPENDENT_AMBULATORY_CARE_PROVIDER_SITE_OTHER): Payer: Medicare Other | Admitting: Family Medicine

## 2022-06-03 VITALS — BP 124/82 | HR 71 | Ht 75.0 in | Wt 167.6 lb

## 2022-06-03 DIAGNOSIS — G8929 Other chronic pain: Secondary | ICD-10-CM | POA: Diagnosis not present

## 2022-06-03 DIAGNOSIS — M545 Low back pain, unspecified: Secondary | ICD-10-CM

## 2022-06-03 DIAGNOSIS — I10 Essential (primary) hypertension: Secondary | ICD-10-CM | POA: Diagnosis not present

## 2022-06-03 DIAGNOSIS — E782 Mixed hyperlipidemia: Secondary | ICD-10-CM | POA: Diagnosis not present

## 2022-06-03 DIAGNOSIS — M549 Dorsalgia, unspecified: Secondary | ICD-10-CM | POA: Insufficient documentation

## 2022-06-03 MED ORDER — VALACYCLOVIR HCL 500 MG PO TABS: 500.0000 mg | ORAL_TABLET | Freq: Two times a day (BID) | ORAL | 2 refills | Status: DC
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
## 2022-06-03 NOTE — Assessment & Plan Note (Addendum)
Chronic intermittent issue for patient.  He reports prior surgical interventions.  Was following with neurosurgery in the past, has not seen a provider in at least 2 years.  Discussed general measures in regards to management of chronic back pain.  Given his surgical intervention in the past, feel that follow-up with his neurosurgeon is reasonable.  In regards to pain control, would defer to specialist, another consideration would be evaluation and treatment with pain management specialist, such a provider may be available through his neurosurgeon office or with alternative provider Patient did have questions regarding opiates to help with control of more severe pain.  Discussed that generally this is not advisable in regards to pain management and we will generally not utilize opiates to help with pain control through our office

## 2022-06-03 NOTE — Assessment & Plan Note (Signed)
Tolerating atorvastatin, would continue with current dose, most recent lipid panel with good control

## 2022-06-03 NOTE — Assessment & Plan Note (Addendum)
Blood pressure slightly elevated on initial reading today, recheck completed while in office At this time can continue with current medications including carvedilol and Entresto Continue close follow-up with cardiology regarding blood pressure as well as management of AAA, heart failure Recommend DASH diet, intermittent monitoring of blood pressure at home

## 2022-06-05 ENCOUNTER — Ambulatory Visit (HOSPITAL_COMMUNITY): Payer: Medicare Other | Attending: Cardiology

## 2022-06-05 DIAGNOSIS — I2510 Atherosclerotic heart disease of native coronary artery without angina pectoris: Secondary | ICD-10-CM

## 2022-06-05 LAB — MYOCARDIAL PERFUSION IMAGING
LV dias vol: 189 mL (ref 62–150)
LV sys vol: 111 mL
Nuc Stress EF: 41 %
Peak HR: 67 {beats}/min
Rest HR: 51 {beats}/min
Rest Nuclear Isotope Dose: 10.7 mCi
SDS: 1
SRS: 1
SSS: 2
ST Depression (mm): 0 mm
Stress Nuclear Isotope Dose: 30.9 mCi
TID: 1.24

## 2022-06-05 MED ORDER — TECHNETIUM TC 99M TETROFOSMIN IV KIT
10.7000 | PACK | Freq: Once | INTRAVENOUS | Status: AC | PRN
  Administered 2022-06-05: 10.7 via INTRAVENOUS

## 2022-06-05 MED ORDER — REGADENOSON 0.4 MG/5ML IV SOLN
0.4000 mg | Freq: Once | INTRAVENOUS | Status: AC
  Administered 2022-06-05: 0.4 mg via INTRAVENOUS

## 2022-06-05 MED ORDER — TECHNETIUM TC 99M TETROFOSMIN IV KIT
30.9000 | PACK | Freq: Once | INTRAVENOUS | Status: AC | PRN
  Administered 2022-06-05: 30.9 via INTRAVENOUS

## 2022-06-08 ENCOUNTER — Telehealth: Payer: Self-pay | Admitting: Cardiovascular Disease

## 2022-06-12 ENCOUNTER — Telehealth: Payer: Self-pay | Admitting: Cardiovascular Disease

## 2022-06-12 DIAGNOSIS — I714 Abdominal aortic aneurysm, without rupture, unspecified: Secondary | ICD-10-CM

## 2022-06-12 NOTE — Telephone Encounter (Signed)
  Per MyChart scheduling message:  The size of my Aortic Aneurism. If the AA was at 4.9 at my last AAA when can I expect a surgical intervention? I was told in the recent past that Surgical intervention was warranted at 5.0 to install a stent device to prevent rupture.

## 2022-06-15 NOTE — Telephone Encounter (Signed)
Left message for patient on voice mail. Sent MyChart message to patient with Scott's recommendation.

## 2022-06-15 NOTE — Telephone Encounter (Signed)
Surgery is usually recommended once the abdominal aortic aneurysm is > 5.5 cm.  My plan was to repeat his [place] in 6 mos and if 5 cm or greater send him to vascular surgery.  I am perfectly fine if he would like to start seeing them now.  If he would like, please refer to [facility].  They will continue to monitor it from here. Thanks! [name], PA-C    06/15/2022 4:52 PM

## 2022-06-18 NOTE — Telephone Encounter (Signed)
Reviewed ultrasound findings.  Abdominal aortic ultrasound increased from 4.5 to 4.9 cm.  Surveillance is reasonable, but also reasonable to refer early to vascular surgery for evaluation.  Would do a CTA study of the abdomen and pelvis (AAA protocol) and refer to vascular surgery for evaluation.  They may ultimately opt to follow him but good to establish early with them.

## 2022-06-18 NOTE — Telephone Encounter (Signed)
Reached out to patient about Dr Antionette Char recommendations. Left detailed voicemail (per DPR). Orders placed at this time for CT and [facility] referral.

## 2022-06-18 NOTE — Addendum Note (Signed)
Addended by: Ma Hillock on: 06/18/2022 01:44 PM   Modules accepted: Orders

## 2022-06-19 NOTE — Telephone Encounter (Signed)
Thank you! [name], PA-C    06/19/2022 12:48 PM

## 2022-06-27 ENCOUNTER — Ambulatory Visit (HOSPITAL_BASED_OUTPATIENT_CLINIC_OR_DEPARTMENT_OTHER)
Admission: RE | Admit: 2022-06-27 | Discharge: 2022-06-27 | Disposition: A | Discharge status: Home / Self Care | Payer: Medicare Other | Source: Ambulatory Visit | Attending: Cardiovascular Disease | Admitting: Cardiovascular Disease

## 2022-06-27 DIAGNOSIS — I714 Abdominal aortic aneurysm, without rupture, unspecified: Secondary | ICD-10-CM | POA: Diagnosis not present

## 2022-06-27 DIAGNOSIS — I7143 Infrarenal abdominal aortic aneurysm, without rupture: Secondary | ICD-10-CM | POA: Diagnosis not present

## 2022-06-27 DIAGNOSIS — K5730 Diverticulosis of large intestine without perforation or abscess without bleeding: Secondary | ICD-10-CM | POA: Diagnosis not present

## 2022-06-27 LAB — POCT I-STAT CREATININE: Creatinine, Ser: 0.9 mg/dL (ref 0.61–1.24)

## 2022-06-27 MED ORDER — IOHEXOL 350 MG/ML SOLN
100.0000 mL | Freq: Once | INTRAVENOUS | Status: AC | PRN
Start: 2022-06-27 — End: 2022-06-27
  Administered 2022-06-27: 100 mL via INTRAVENOUS

## 2022-06-29 ENCOUNTER — Encounter: Payer: Self-pay | Admitting: *Deleted
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
## 2022-06-30 ENCOUNTER — Telehealth (INDEPENDENT_AMBULATORY_CARE_PROVIDER_SITE_OTHER): Payer: Medicare Other | Admitting: Adult Health

## 2022-06-30 VITALS — BP 97/66 | HR 57

## 2022-06-30 DIAGNOSIS — I2510 Atherosclerotic heart disease of native coronary artery without angina pectoris: Secondary | ICD-10-CM | POA: Diagnosis not present

## 2022-06-30 DIAGNOSIS — G4700 Insomnia, unspecified: Secondary | ICD-10-CM | POA: Diagnosis not present

## 2022-06-30 DIAGNOSIS — G4730 Sleep apnea, unspecified: Secondary | ICD-10-CM

## 2022-06-30 DIAGNOSIS — G4733 Obstructive sleep apnea (adult) (pediatric): Secondary | ICD-10-CM

## 2022-06-30 DIAGNOSIS — Z9989 Dependence on other enabling machines and devices: Secondary | ICD-10-CM

## 2022-06-30 MED ORDER — GABAPENTIN 100 MG PO CAPS: ORAL_CAPSULE | ORAL | 1 refills | Status: DC

## 2022-06-30 MED ORDER — TIZANIDINE HCL 4 MG PO TABS: ORAL_TABLET | ORAL | 1 refills | Status: DC

## 2022-07-03 ENCOUNTER — Telehealth: Payer: Self-pay

## 2022-07-03 DIAGNOSIS — I714 Abdominal aortic aneurysm, without rupture, unspecified: Secondary | ICD-10-CM

## 2022-07-03 NOTE — Telephone Encounter (Signed)
-----   Message from Sherren Mocha, MD sent at 06/28/2022  5:42 PM EDT ----- 4.7x4.7 cm AAA, infrarenal. Please refer to [facility] for eval - probably does not meet criteria for EVAR but will ask vascular surgery to follow him to best determine appropriate timing for aneurysm repair in the future. thx

## 2022-07-03 NOTE — Telephone Encounter (Signed)
Spoke to patient who understands results. Referral placed to [facility] at this time.
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
## 2022-07-10 ENCOUNTER — Encounter: Payer: Self-pay | Admitting: Vascular Surgery

## 2022-07-10 ENCOUNTER — Ambulatory Visit (INDEPENDENT_AMBULATORY_CARE_PROVIDER_SITE_OTHER): Payer: Medicare Other | Admitting: Vascular Surgery

## 2022-07-10 VITALS — BP 188/106 | HR 62 | Temp 97.9°F | Resp 20 | Ht 75.0 in | Wt 168.0 lb

## 2022-07-10 DIAGNOSIS — I7143 Infrarenal abdominal aortic aneurysm, without rupture: Secondary | ICD-10-CM | POA: Diagnosis not present

## 2022-07-14 ENCOUNTER — Other Ambulatory Visit: Payer: Self-pay

## 2022-07-14 DIAGNOSIS — I7143 Infrarenal abdominal aortic aneurysm, without rupture: Secondary | ICD-10-CM

## 2022-07-14 DIAGNOSIS — I739 Peripheral vascular disease, unspecified: Secondary | ICD-10-CM

## 2022-07-16 ENCOUNTER — Other Ambulatory Visit: Payer: Self-pay | Admitting: Physician Assistant

## 2022-08-27 ENCOUNTER — Ambulatory Visit: Payer: Medicare Other

## 2022-09-11 ENCOUNTER — Other Ambulatory Visit: Payer: Self-pay | Admitting: Neurology

## 2022-09-11 NOTE — Telephone Encounter (Signed)
Call through answering service - patient will run out of meds through this weekend. Office closed at 12.00 noon.  Call came in at 13.00. refilled clonazepam 120 tabs of 0.5 mg .

## 2022-10-15 ENCOUNTER — Other Ambulatory Visit (HOSPITAL_BASED_OUTPATIENT_CLINIC_OR_DEPARTMENT_OTHER): Payer: Self-pay | Admitting: Family Medicine

## 2022-10-21 ENCOUNTER — Other Ambulatory Visit: Payer: Self-pay | Admitting: Cardiovascular Disease

## 2022-10-27 ENCOUNTER — Telehealth: Payer: Self-pay | Admitting: Neurology

## 2022-10-27 DIAGNOSIS — G4733 Obstructive sleep apnea (adult) (pediatric): Secondary | ICD-10-CM

## 2022-10-27 NOTE — Telephone Encounter (Addendum)
Looking at  set up  last machine was 01-25-2017.  He was seen 06-2022 with MM/NP for cpap compliance, (excellent compliance). .   Last SS with 04-15-2018.

## 2022-10-27 NOTE — Telephone Encounter (Signed)
Pt left a vm at 11:59 asking that Dr Brett Fairy please fax over the Rx for his CPAP to [phone] for DME, he has been told his CPAP is on sale for $499 and he would like to go ahead and purchase it .

## 2022-10-27 NOTE — Telephone Encounter (Signed)
If he is paying out of pocket ok to send order

## 2022-10-28 NOTE — Telephone Encounter (Signed)
Called pt & LVM (ok per DPR) asking for call back or mychart message to let us know if he is planning to purchase his CPAP OOP or run through insurance.

## 2022-10-28 NOTE — Telephone Encounter (Signed)
Does he plan to run this through his insurance?

## 2022-10-29 NOTE — Telephone Encounter (Signed)
Pt is calling. Stated he would be paying CASH for his CPAP machine.

## 2022-10-29 NOTE — Addendum Note (Signed)
Addended by: Gildardo Griffes on: 10/29/2022 02:45 PM   Modules accepted: Orders

## 2022-10-29 NOTE — Addendum Note (Signed)
Addended by: Gildardo Griffes on: 10/29/2022 04:29 PM   Modules accepted: Orders

## 2022-10-29 NOTE — Telephone Encounter (Signed)
Canceled order at [facility]. Faxed order to [facility]. Dr Dohmeier signed new order. [phone]. Received a receipt of confirmation.

## 2022-10-29 NOTE — Telephone Encounter (Signed)
Order for new PAP machine sent to [facility].

## 2022-10-31 IMAGING — DX DG CHEST 2V
2 series · 2 of 2 positions shown · non-contrast
Comparison: Prior chest radiographs 08/17/2012 and earlier. MRI of
the left shoulder 12/04/2015.

CLINICAL DATA: Chest pain. Additional history provided: Chest
discomfort and tingling in left side of body this morning. History
of heart surgery.

EXAM:
CHEST - 2 VIEW

[x chest ap]
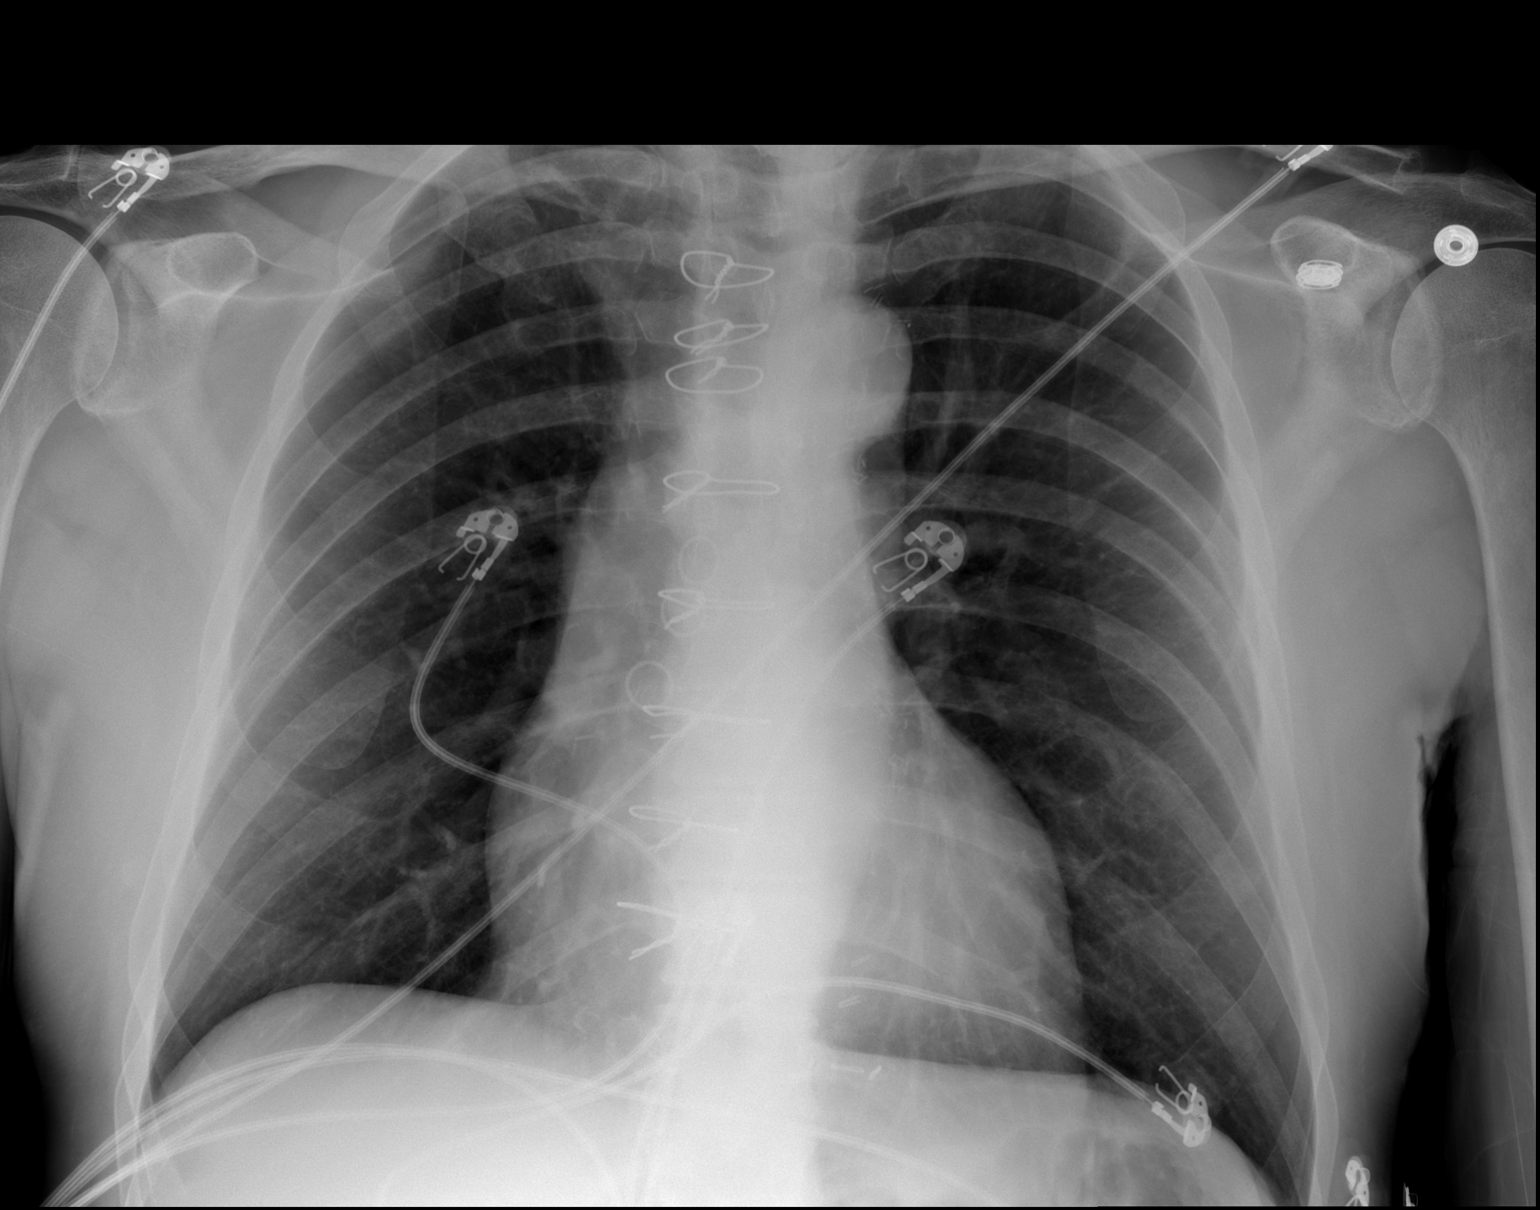

[w chest lat]
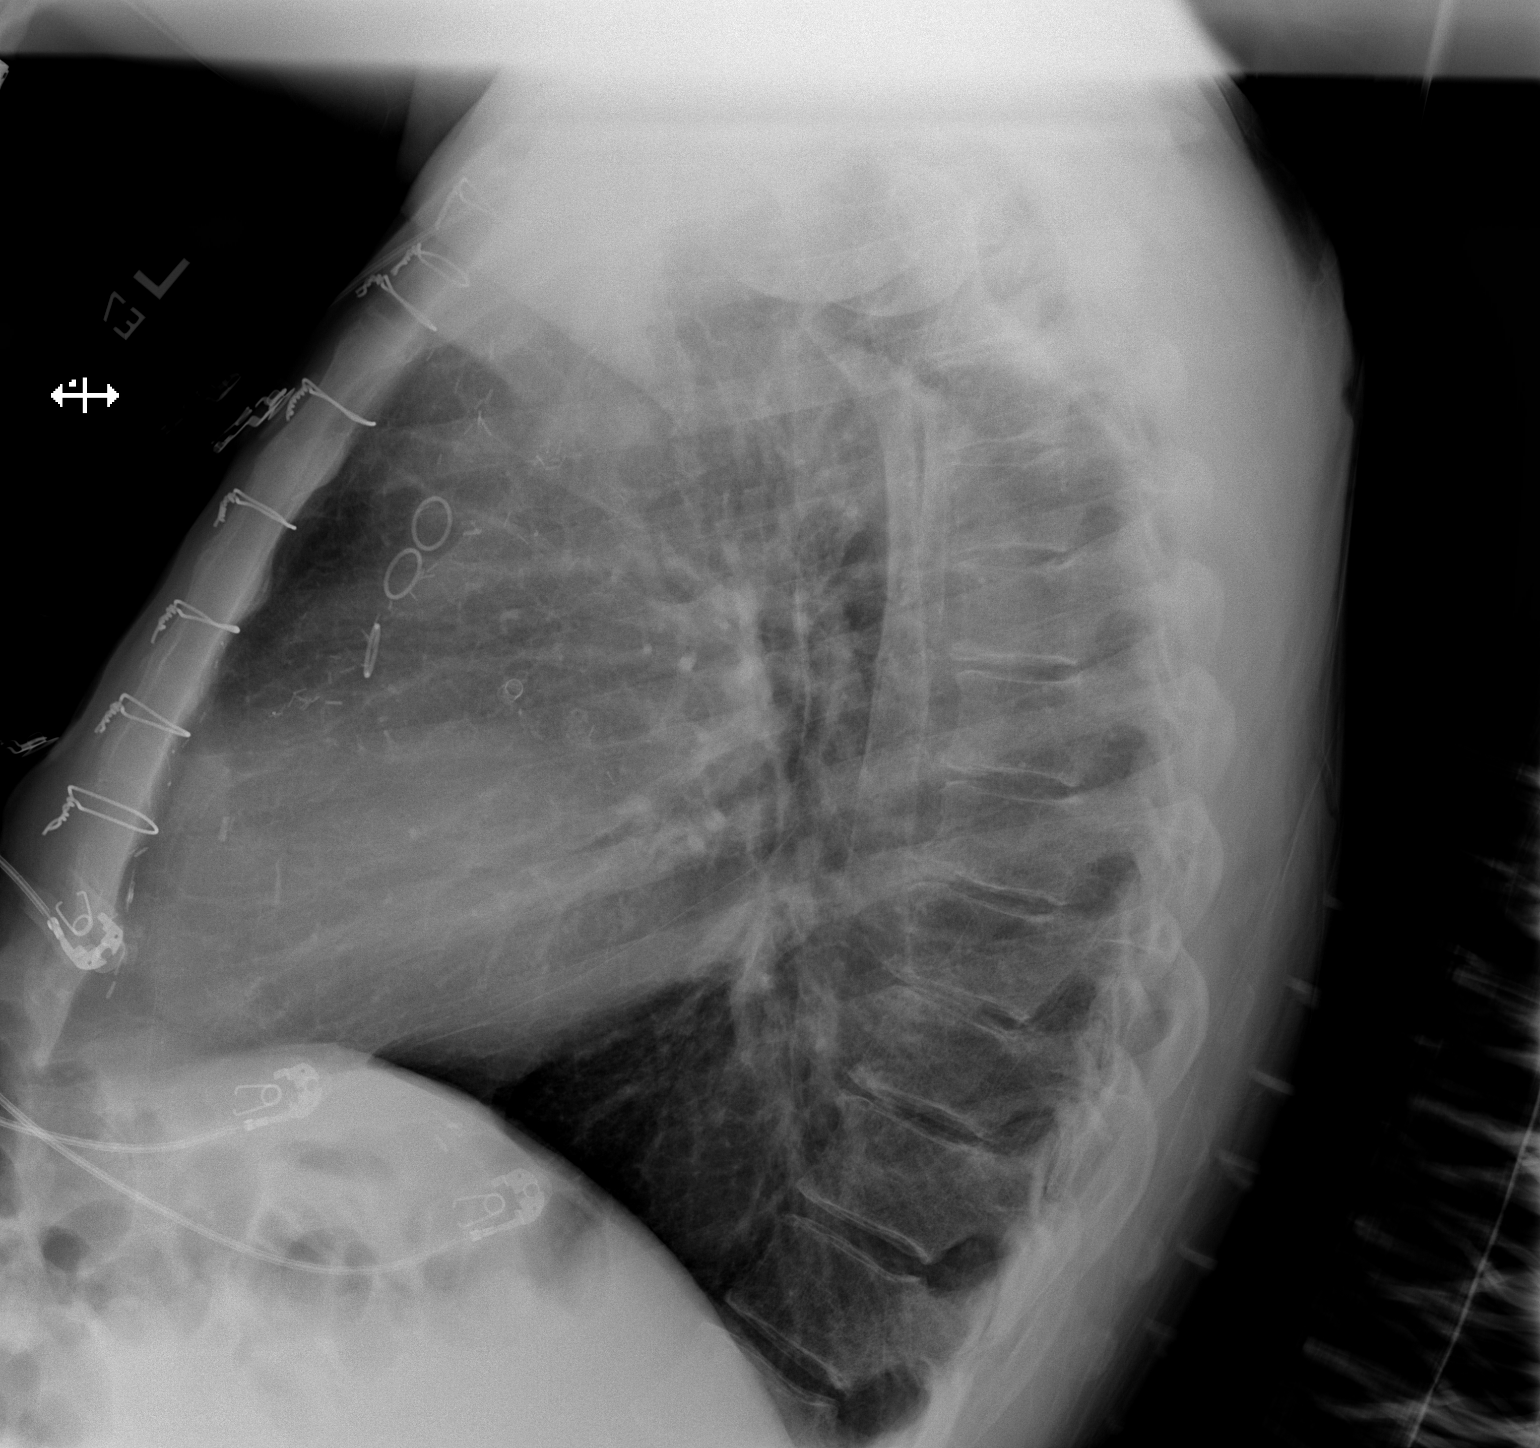

[2 of 2 positions shown; findings below may reference images not displayed]

FINDINGS: Prior median sternotomy/CABG. Heart size within normal limits.
Aortic atherosclerosis. No appreciable airspace consolidation or
pulmonary edema. No evidence of pleural effusion or pneumothorax. No
acute bony abnormality identified. Partially imaged ACDF hardware.
Redemonstrated sequela of prior left acromioclavicular joint
resection.
IMPRESSION: No evidence of acute cardiopulmonary abnormality.

Aortic Atherosclerosis ([id number]-138.8).

## 2022-11-03 ENCOUNTER — Encounter: Payer: Self-pay | Admitting: *Deleted
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
## 2022-11-04 ENCOUNTER — Ambulatory Visit: Payer: Medicare Other | Attending: Nurse Practitioner | Admitting: Student

## 2022-11-04 ENCOUNTER — Encounter: Payer: Self-pay | Admitting: Student

## 2022-11-04 VITALS — BP 120/70 | HR 63 | Ht 75.0 in | Wt 164.2 lb

## 2022-11-04 DIAGNOSIS — I10 Essential (primary) hypertension: Secondary | ICD-10-CM | POA: Diagnosis not present

## 2022-11-04 DIAGNOSIS — I5020 Unspecified systolic (congestive) heart failure: Secondary | ICD-10-CM | POA: Diagnosis not present

## 2022-11-04 DIAGNOSIS — I2510 Atherosclerotic heart disease of native coronary artery without angina pectoris: Secondary | ICD-10-CM | POA: Insufficient documentation

## 2022-11-04 NOTE — Patient Instructions (Signed)
Medication Instructions:  Your physician recommends that you continue on your current medications as directed. Please refer to the Current Medication list given to you today.  *If you need a refill on your cardiac medications before your next appointment, please call your pharmacy*   Lab Work: WHEN YOU COME FOR THE ECHOCARDIOGRAM, COME FASTING FOR:  LIPID & LFT IF YOUR PRIMARY CARE DR DRAWS THEM IN THE MEANTIME, YOU CAN CANCEL THE LAB APPOINTMENT  If you have labs (blood work) drawn today and your tests are completely normal, you will receive your results only by: [place] (if you have MyChart) OR A paper copy in the mail If you have any lab test that is abnormal or we need to change your treatment, we will call you to review the results.   Testing/Procedures: Your physician has requested that you have an echocardiogram. Echocardiography is a painless test that uses sound waves to create images of your heart. It provides your doctor with information about the size and shape of your heart and how well your heart's chambers and valves are working. This procedure takes approximately one hour. There are no restrictions for this procedure. Please do NOT wear cologne, perfume, aftershave, or lotions (deodorant is allowed). Please arrive 15 minutes prior to your appointment time.   Follow-Up: At [facility], you and your health needs are our priority.  As part of our continuing mission to provide you with exceptional heart care, we have created designated Provider Care Teams.  These Care Teams include your primary Cardiologist (physician) and Advanced Practice Providers (APPs -  Physician Assistants and Nurse Practitioners) who all work together to provide you with the care you need, when you need it.  We recommend signing up for the patient portal called "MyChart".  Sign up information is provided on this After Visit Summary.  MyChart is used to connect with patients for Virtual  Visits (Telemedicine).  Patients are able to view lab/test results, encounter notes, upcoming appointments, etc.  Non-urgent messages can be sent to your provider as well.   To learn more about what you can do with MyChart, go to [email or web address].    Your next appointment:   6 month(s)  The format for your next appointment:   In Person  Provider:   Sherren Mocha, MD  or [name], PA-C         Other Instructions   Important Information About Sugar

## 2022-11-11 ENCOUNTER — Ambulatory Visit: Payer: Medicare Other | Admitting: Nurse Practitioner

## 2022-11-11 ENCOUNTER — Ambulatory Visit: Payer: Medicare Other | Admitting: Physician Assistant

## 2022-11-27 ENCOUNTER — Other Ambulatory Visit: Payer: Self-pay | Admitting: *Deleted

## 2022-11-27 DIAGNOSIS — I7143 Infrarenal abdominal aortic aneurysm, without rupture: Secondary | ICD-10-CM

## 2022-11-27 DIAGNOSIS — I739 Peripheral vascular disease, unspecified: Secondary | ICD-10-CM

## 2022-11-30 ENCOUNTER — Ambulatory Visit: Payer: Medicare Other | Attending: Physician Assistant

## 2022-11-30 ENCOUNTER — Ambulatory Visit: Payer: Medicare Other

## 2022-11-30 DIAGNOSIS — I10 Essential (primary) hypertension: Secondary | ICD-10-CM | POA: Insufficient documentation

## 2022-11-30 DIAGNOSIS — I2510 Atherosclerotic heart disease of native coronary artery without angina pectoris: Secondary | ICD-10-CM | POA: Diagnosis not present

## 2022-11-30 DIAGNOSIS — I5020 Unspecified systolic (congestive) heart failure: Secondary | ICD-10-CM | POA: Diagnosis not present

## 2022-11-30 LAB — HEPATIC FUNCTION PANEL
ALT: 20 IU/L (ref 0–44)
AST: 16 IU/L (ref 0–40)
Albumin: 4.7 g/dL (ref 3.9–4.9)
Alkaline Phosphatase: 57 IU/L (ref 44–121)
Bilirubin Total: 0.3 mg/dL (ref 0.0–1.2)
Bilirubin, Direct: <0.1 mg/dL (ref 0.00–0.40)
Total Protein: 7 g/dL (ref 6.0–8.5)

## 2022-11-30 LAB — LIPID PANEL
Chol/HDL Ratio: 2.9 ratio (ref 0.0–5.0)
Cholesterol, Total: 153 mg/dL (ref 100–199)
HDL: 53 mg/dL (ref >39)
LDL Chol Calc (NIH): 80 mg/dL (ref 0–99)
Triglycerides: 111 mg/dL (ref 0–149)
VLDL Cholesterol Cal: 20 mg/dL (ref 5–40)

## 2022-11-30 LAB — ECHOCARDIOGRAM COMPLETE
Area-P 1/2: 2.14 cm2
S' Lateral: 3.9 cm

## 2022-12-01 ENCOUNTER — Telehealth: Payer: Self-pay | Admitting: *Deleted

## 2022-12-01 DIAGNOSIS — I77810 Thoracic aortic ectasia: Secondary | ICD-10-CM

## 2022-12-01 DIAGNOSIS — I5020 Unspecified systolic (congestive) heart failure: Secondary | ICD-10-CM

## 2022-12-01 NOTE — Telephone Encounter (Signed)
-----   Message from Liliane Shi, [place] sent at 11/30/2022  4:56 PM EST ----- Results sent to [name] via [place]. See MyChart comment. PLAN: -Repeat echo in 1 year (dilated ascending aorta) [name], PA-C    11/30/2022 4:42 PM

## 2022-12-03 ENCOUNTER — Encounter (HOSPITAL_BASED_OUTPATIENT_CLINIC_OR_DEPARTMENT_OTHER): Payer: Self-pay | Admitting: Family Medicine

## 2022-12-03 ENCOUNTER — Ambulatory Visit (INDEPENDENT_AMBULATORY_CARE_PROVIDER_SITE_OTHER): Payer: Medicare Other | Admitting: Family Medicine

## 2022-12-03 VITALS — BP 141/76 | HR 63 | Ht 75.0 in | Wt 166.1 lb

## 2022-12-03 DIAGNOSIS — E782 Mixed hyperlipidemia: Secondary | ICD-10-CM

## 2022-12-03 DIAGNOSIS — I10 Essential (primary) hypertension: Secondary | ICD-10-CM | POA: Diagnosis not present

## 2022-12-03 NOTE — Progress Notes (Signed)
    Procedures performed today:    None.  Independent interpretation of notes and tests performed by another provider:   None.  Brief History, Exam, Impression, and Recommendations:    BP (!) 141/76 (BP Location: Left Arm, Patient Position: Sitting, Cuff Size: Normal)   Pulse 63   Ht '6\' 3"'$  (1.905 m)   Wt 166 lb 1.6 oz (75.3 kg)   SpO2 100%   BMI 20.76 kg/m   Essential hypertension Blood pressure borderline controlled in office today.  No current issues related to chest pain, headaches, lightheadedness or dizziness.  Patient continues with carvedilol as well as Entresto.  No concerns reported today related to these medications No changes to be made to current medication regimen.  Can continue with regular follow-up with cardiology.  Recommend intermittent monitoring of blood pressure at home, DASH diet.  Hyperlipidemia Patient continues with atorvastatin, no issues reported, no reported myalgias.  Given the patient has been tolerating medication without issue, recommend continuing with current dose  Return in about 6 months (around 06/04/2023).   ___________________________________________ Ani Deoliveira [place], MD, ABFM, CAQSM [facility] and [place]

## 2022-12-05 ENCOUNTER — Other Ambulatory Visit: Payer: Self-pay | Admitting: Neurology

## 2022-12-05 NOTE — Telephone Encounter (Signed)
I received message from [facility] answereing service from pt asking forr efill for clonazepam which I did

## 2022-12-10 ENCOUNTER — Telehealth: Payer: Self-pay | Admitting: *Deleted

## 2022-12-10 ENCOUNTER — Ambulatory Visit (INDEPENDENT_AMBULATORY_CARE_PROVIDER_SITE_OTHER)
Admission: RE | Admit: 2022-12-10 | Discharge: 2022-12-10 | Disposition: A | Discharge status: Home / Self Care | Payer: Medicare Other | Source: Ambulatory Visit | Attending: Vascular Surgery | Admitting: Vascular Surgery

## 2022-12-10 ENCOUNTER — Ambulatory Visit (HOSPITAL_COMMUNITY)
Admission: RE | Admit: 2022-12-10 | Discharge: 2022-12-10 | Disposition: A | Discharge status: Home / Self Care | Payer: Medicare Other | Source: Ambulatory Visit | Attending: Vascular Surgery | Admitting: Vascular Surgery

## 2022-12-10 DIAGNOSIS — I739 Peripheral vascular disease, unspecified: Secondary | ICD-10-CM | POA: Diagnosis not present

## 2022-12-10 DIAGNOSIS — I7143 Infrarenal abdominal aortic aneurysm, without rupture: Secondary | ICD-10-CM | POA: Diagnosis not present

## 2022-12-10 DIAGNOSIS — Z79899 Other long term (current) drug therapy: Secondary | ICD-10-CM

## 2022-12-10 NOTE — Telephone Encounter (Signed)
Error

## 2022-12-10 NOTE — Telephone Encounter (Signed)
-----   Message from Liliane Shi, [place] sent at 12/01/2022  1:49 PM EST ----- Results sent to [name] via [place]. See MyChart comment. PLAN: -Repeat fasting Lipids in 6 mos.  [name], PA-C    12/01/2022 1:45 PM

## 2022-12-15 DIAGNOSIS — H903 Sensorineural hearing loss, bilateral: Secondary | ICD-10-CM | POA: Diagnosis not present
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
## 2022-12-18 ENCOUNTER — Other Ambulatory Visit: Payer: Self-pay

## 2022-12-18 ENCOUNTER — Telehealth: Payer: Self-pay | Admitting: Cardiovascular Disease

## 2022-12-18 ENCOUNTER — Ambulatory Visit (INDEPENDENT_AMBULATORY_CARE_PROVIDER_SITE_OTHER): Payer: Medicare Other | Admitting: Vascular Surgery

## 2022-12-18 ENCOUNTER — Encounter: Payer: Self-pay | Admitting: Vascular Surgery

## 2022-12-18 VITALS — BP 166/94 | HR 69 | Temp 98.8°F | Resp 20 | Ht 75.0 in | Wt 166.0 lb

## 2022-12-18 DIAGNOSIS — I7143 Infrarenal abdominal aortic aneurysm, without rupture: Secondary | ICD-10-CM | POA: Diagnosis not present

## 2022-12-18 DIAGNOSIS — I713 Abdominal aortic aneurysm, ruptured, unspecified: Secondary | ICD-10-CM

## 2022-12-18 NOTE — Telephone Encounter (Signed)
Patient stated if he needs a visit he would like it scheduled as soon as possible as they are looking to schedule the surgery in early February.

## 2022-12-18 NOTE — Telephone Encounter (Signed)
I d/w the pre op app if the pt is going to need an appt as the pt called to schedule. I had stated to operator that I am not sure yet if the pt is going to need an appt and if so what type. I said that we will call the pt once I know from the pre op app.    Per Ambrose Pancoast, NP: Marykay Lex we are waiting on Dr. Antionette Char recommendation on the Plavix and then we will schedule a Tele visit.    Once I have been instructed I will call the pt to schedule what seems will be a tele visit.

## 2022-12-18 NOTE — Telephone Encounter (Signed)
I s/w the requesting office and confirmed that ASA does not need to be held, however the Plavix to be held 5-7 days if possible.   I will update the pre op provider

## 2022-12-18 NOTE — Telephone Encounter (Signed)
     Pre-operative Risk Assessment    Patient Name: [name]  DOB: March 06, 1951 MRN: [id number]      Request for Surgical Clearance    Procedure:   EVAR Triple A  Date of Surgery:  Clearance TBD                                 Surgeon:  Dr. Jerrol Banana Group or Practice Name:  [facility]  Phone number:  [phone] Fax number:  [phone]   Type of Clearance Requested:   - Medical  - Pharmacy:  Hold defer to cards      Type of Anesthesia:  General    Additional requests/questions:    Signed, Selinda Orion   12/18/2022, 11:37 AM

## 2022-12-21 NOTE — Assessment & Plan Note (Signed)
Blood pressure borderline controlled in office today.  No current issues related to chest pain, headaches, lightheadedness or dizziness.  Patient continues with carvedilol as well as Entresto.  No concerns reported today related to these medications No changes to be made to current medication regimen.  Can continue with regular follow-up with cardiology.  Recommend intermittent monitoring of blood pressure at home, DASH diet.

## 2022-12-21 NOTE — Assessment & Plan Note (Signed)
Patient continues with atorvastatin, no issues reported, no reported myalgias.  Given the patient has been tolerating medication without issue, recommend continuing with current dose

## 2022-12-22 NOTE — Telephone Encounter (Signed)
Reviewed chart. Patient at low risk of holding plavix for noncardiac surgery. His intervention was in 2022 and recommended to take 6 months of DAPT without interruption. He was seen in 10/2022 and had no cardiac complaints at that time. Can hold 5-7 days before surgery as needed.

## 2022-12-22 NOTE — Telephone Encounter (Signed)
Pt has been advised ok to hold Plavix 5-7 days per Dr. Burt Knack. Pt aware he will resume once surgeon feels it is safe from a hemostasis stand point. Pt thanked me for the call and the help.

## 2022-12-22 NOTE — Telephone Encounter (Signed)
   Patient Name: [name]  DOB: Jun 19, 1951 MRN: [id number]  Primary Cardiologist: Sherren Mocha, MD  Chart reviewed as part of pre-operative protocol coverage. Pre-op clearance already addressed by colleagues in earlier phone notes. To summarize recommendations:  Reviewed chart. Patient at low risk of holding plavix for noncardiac surgery. His intervention was in 2022 and recommended to take 6 months of DAPT without interruption. He was seen in 10/2022 and had no cardiac complaints at that time. Can hold 5-7 days before surgery as needed.   -Dr. Burt Knack  Will route this bundled recommendation to requesting provider via Epic fax function and remove from pre-op pool. Please call with questions.  Elgie Collard, PA-C 12/22/2022, 1:53 PM

## 2022-12-23 ENCOUNTER — Telehealth: Payer: Self-pay

## 2022-12-23 NOTE — Telephone Encounter (Signed)
Received a notification that patient inquired about when his surgery would be scheduled. Per Dr. Virl Cagey, patient is to have an EVAR pending cardiac clearance and CTA Abd/pelvis. Received fax communication on yesterday that patient has been cleared by cardiology. CTA is scheduled for 12/29/22. Attempted to reach patient, but no answer. DPR on file- left detailed message explaining that he must complete studies prior to scheduling and to contact office back with any further questions.

## 2022-12-29 ENCOUNTER — Ambulatory Visit (HOSPITAL_COMMUNITY)
Admission: RE | Admit: 2022-12-29 | Discharge: 2022-12-29 | Disposition: A | Discharge status: Home / Self Care | Payer: Medicare Other | Source: Ambulatory Visit | Attending: Vascular Surgery | Admitting: Vascular Surgery

## 2022-12-29 DIAGNOSIS — I713 Abdominal aortic aneurysm, ruptured, unspecified: Secondary | ICD-10-CM | POA: Diagnosis not present

## 2022-12-29 DIAGNOSIS — I7143 Infrarenal abdominal aortic aneurysm, without rupture: Secondary | ICD-10-CM | POA: Diagnosis not present

## 2022-12-29 MED ORDER — IOHEXOL 350 MG/ML SOLN
100.0000 mL | Freq: Once | INTRAVENOUS | Status: AC | PRN
  Administered 2022-12-29: 100 mL via INTRAVENOUS

## 2023-01-05 ENCOUNTER — Ambulatory Visit: Payer: Medicare Other | Admitting: Vascular Surgery

## 2023-01-06 ENCOUNTER — Other Ambulatory Visit: Payer: Self-pay

## 2023-01-06 DIAGNOSIS — I7143 Infrarenal abdominal aortic aneurysm, without rupture: Secondary | ICD-10-CM

## 2023-01-06 DIAGNOSIS — I998 Other disorder of circulatory system: Secondary | ICD-10-CM

## 2023-01-11 NOTE — Progress Notes (Signed)
Surgical Instructions    Your procedure is scheduled on Monday, February 5th, 2024.   Report to [facility] Main Entrance "A" at 08:00 A.M., then check in with the Admitting office.  Call this number if you have problems the morning of surgery:  [phone]   If you have any questions prior to your surgery date call [phone]: Open Monday-Friday 8am-4pm If you experience any cold or flu symptoms such as cough, fever, chills, shortness of breath, etc. between now and your scheduled surgery, please notify us at the above number     Remember:  Do not eat or drink after midnight the night before your surgery    Take these medicines the morning of surgery with A SIP OF WATER:   acetaminophen (TYLENOL)  atorvastatin (LIPITOR)  carvedilol (COREG)  ofloxacin (FLOXIN OTIC)  valACYclovir (VALTREX)   As needed:  clonazePAM (KLONOPIN)  nitroGLYCERIN (NITROSTAT)   Follow your surgeon's instructions on when to stop Aspirin and Plavix.  If no instructions were given by your surgeon then you will need to call the office to get those instructions.     As of today, STOP taking any Aspirin (unless otherwise instructed by your surgeon) Aleve, Naproxen, Ibuprofen, Motrin, Advil, Goody's, BC's, all herbal medications, fish oil, and all vitamins.  The day of surgery:          Do not wear jewelry. Do not wear lotions, powders, cologne or deodorant. Men may shave face and neck. Do not bring valuables to the hospital.  [facility] is not responsible for any belongings or valuables.    Do NOT Smoke (Tobacco/Vaping)  24 hours prior to your procedure  If you use a CPAP at night, you may bring your mask for your overnight stay.   Contacts, glasses, hearing aids, dentures or partials may not be worn into surgery, please bring cases for these belongings   For patients admitted to the hospital, discharge time will be determined by your treatment team.   Patients discharged the day of surgery will  not be allowed to drive home, and someone needs to stay with them for 24 hours.   SURGICAL WAITING ROOM VISITATION Patients having surgery or a procedure may have no more than 2 support people in the waiting area - these visitors may rotate.   Children under the age of 47 must have an adult with them who is not the patient. If the patient needs to stay at the hospital during part of their recovery, the visitor guidelines for inpatient rooms apply. Pre-op nurse will coordinate an appropriate time for 1 support person to accompany patient in pre-op.  This support person may not rotate.   Please refer to [email or web address] for the visitor guidelines for Inpatients (after your surgery is over and you are in a regular room).    Special instructions:    Oral Hygiene is also important to reduce your risk of infection.  Remember - BRUSH YOUR TEETH THE MORNING OF SURGERY WITH YOUR REGULAR TOOTHPASTE   [place]- Preparing For Surgery  Before surgery, you can play an important role. Because skin is not sterile, your skin needs to be as free of germs as possible. You can reduce the number of germs on your skin by washing with CHG (chlorahexidine gluconate) Soap before surgery.  CHG is an antiseptic cleaner which kills germs and bonds with the skin to continue killing germs even after washing.     Please do not use if you have an allergy to  CHG or antibacterial soaps. If your skin becomes reddened/irritated stop using the CHG.  Do not shave (including legs and underarms) for at least 48 hours prior to first CHG shower. It is OK to shave your face.  Please follow these instructions carefully.     Shower the NIGHT BEFORE SURGERY and the MORNING OF SURGERY with CHG Soap.   If you chose to wash your hair, wash your hair first as usual with your normal shampoo. After you shampoo, rinse your hair and body thoroughly to remove the shampoo.  Then [employer] and genitals (private parts) with your normal soap and rinse thoroughly to remove soap.  After that Use CHG Soap as you would any other liquid soap. You can apply CHG directly to the skin and wash gently with a scrungie or a clean washcloth.   Apply the CHG Soap to your body ONLY FROM THE NECK DOWN.  Do not use on open wounds or open sores. Avoid contact with your eyes, ears, mouth and genitals (private parts). Wash Face and genitals (private parts)  with your normal soap.   Wash thoroughly, paying special attention to the area where your surgery will be performed.  Thoroughly rinse your body with warm water from the neck down.  DO NOT shower/wash with your normal soap after using and rinsing off the CHG Soap.  Pat yourself dry with a CLEAN TOWEL.  Wear CLEAN PAJAMAS to bed the night before surgery  Place CLEAN SHEETS on your bed the night before your surgery  DO NOT SLEEP WITH PETS.   Day of Surgery:  Take a shower with CHG soap. Wear Clean/Comfortable clothing the morning of surgery Do not apply any deodorants/lotions.   Remember to brush your teeth WITH YOUR REGULAR TOOTHPASTE.    If you received a COVID test during your pre-op visit, it is requested that you wear a mask when out in public, stay away from anyone that may not be feeling well, and notify your surgeon if you develop symptoms. If you have been in contact with anyone that has tested positive in the last 10 days, please notify your surgeon.    Please read over the following fact sheets that you were given.

## 2023-01-12 ENCOUNTER — Encounter (HOSPITAL_COMMUNITY)
Admission: RE | Admit: 2023-01-12 | Discharge: 2023-01-12 | Disposition: A | Discharge status: Home / Self Care | Payer: Medicare Other | Source: Ambulatory Visit | Attending: Vascular Surgery | Admitting: Vascular Surgery

## 2023-01-12 ENCOUNTER — Encounter (HOSPITAL_COMMUNITY): Payer: Self-pay

## 2023-01-12 ENCOUNTER — Other Ambulatory Visit: Payer: Self-pay

## 2023-01-12 VITALS — BP 138/86 | Temp 97.8°F | Resp 18 | Ht 75.0 in | Wt 157.9 lb

## 2023-01-12 DIAGNOSIS — Z01812 Encounter for preprocedural laboratory examination: Secondary | ICD-10-CM | POA: Diagnosis not present

## 2023-01-12 DIAGNOSIS — Z0182 Encounter for allergy testing: Secondary | ICD-10-CM | POA: Diagnosis present

## 2023-01-12 DIAGNOSIS — I998 Other disorder of circulatory system: Secondary | ICD-10-CM | POA: Insufficient documentation

## 2023-01-12 DIAGNOSIS — I7143 Infrarenal abdominal aortic aneurysm, without rupture: Secondary | ICD-10-CM | POA: Diagnosis not present

## 2023-01-12 DIAGNOSIS — Z01818 Encounter for other preprocedural examination: Secondary | ICD-10-CM

## 2023-01-12 LAB — URINALYSIS, ROUTINE W REFLEX MICROSCOPIC
Bilirubin Urine: NEGATIVE
Glucose, UA: NEGATIVE mg/dL
Hgb urine dipstick: NEGATIVE
Ketones, ur: NEGATIVE mg/dL
Leukocytes,Ua: NEGATIVE
Nitrite: NEGATIVE
Protein, ur: NEGATIVE mg/dL
Specific Gravity, Urine: 1.013 (ref 1.005–1.030)
pH: 7 (ref 5.0–8.0)

## 2023-01-12 LAB — APTT: aPTT: 33 seconds (ref 24–36)

## 2023-01-12 LAB — COMPREHENSIVE METABOLIC PANEL
ALT: 16 U/L (ref 0–44)
AST: 22 U/L (ref 15–41)
Albumin: 4 g/dL (ref 3.5–5.0)
Alkaline Phosphatase: 62 U/L (ref 38–126)
Anion gap: 11 (ref 5–15)
BUN: 10 mg/dL (ref 8–23)
CO2: 25 mmol/L (ref 22–32)
Calcium: 9.2 mg/dL (ref 8.9–10.3)
Chloride: 97 mmol/L — ABNORMAL LOW (ref 98–111)
Creatinine, Ser: 0.8 mg/dL (ref 0.61–1.24)
GFR, Estimated: >60 mL/min (ref >60)
Glucose, Bld: 119 mg/dL — ABNORMAL HIGH (ref 70–99)
Potassium: 4 mmol/L (ref 3.5–5.1)
Sodium: 133 mmol/L — ABNORMAL LOW (ref 135–145)
Total Bilirubin: 0.4 mg/dL (ref 0.3–1.2)
Total Protein: 7.5 g/dL (ref 6.5–8.1)

## 2023-01-12 LAB — CBC
HCT: 34.8 % — ABNORMAL LOW (ref 39.0–52.0)
Hemoglobin: 12 g/dL — ABNORMAL LOW (ref 13.0–17.0)
MCH: 31.7 pg (ref 26.0–34.0)
MCHC: 34.5 g/dL (ref 30.0–36.0)
MCV: 91.8 fL (ref 80.0–100.0)
Platelets: 210 10*3/uL (ref 150–400)
RBC: 3.79 MIL/uL — ABNORMAL LOW (ref 4.22–5.81)
RDW: 12.5 % (ref 11.5–15.5)
WBC: 7 10*3/uL (ref 4.0–10.5)
nRBC: 0 % (ref 0.0–0.2)

## 2023-01-12 LAB — PROTIME-INR
INR: 1 (ref 0.8–1.2)
Prothrombin Time: 13.3 seconds (ref 11.4–15.2)

## 2023-01-12 LAB — SURGICAL PCR SCREEN
MRSA, PCR: NEGATIVE
Staphylococcus aureus: NEGATIVE

## 2023-01-12 NOTE — Progress Notes (Addendum)
PCP - Arlina Robes [place] Cardiologist - Dr. Burt Knack with [facility]  PPM/ICD - Denies Device Orders -  Rep Notified -   Chest x-ray - [facility] EKG - 11/04/22 Stress Test - 06/05/22 ECHO - 11/30/22 Cardiac Cath - 12/20/20  Sleep Study - Yes has OSA CPAP - Nightly use  DM - Denies  Blood Thinner Instructions:Plavix generic, told to stop 5 days prior to surgery by Dr. Tor Netters office Aspirin Instructions: Will continue through [place] TEST- NI   Anesthesia review: Yes cardiac history  Patient denies shortness of breath, fever, cough and chest pain at PAT appointment   All instructions explained to the patient, with a verbal understanding of the material. Patient agrees to go over the instructions while at home for a better understanding. . The opportunity to ask questions was provided.  Added Gabapentin and Zanaflex to patient's medications to take DOS.

## 2023-01-13 NOTE — Progress Notes (Signed)
Anesthesia Chart Review:  Follows with cardiology for history of  CAD s/p CABG x 6 in 2002 and DES to LAD 2022, HFmrEF, abdominal aortic aneurysm, OSA on CPAP, hypertension, hyperlipidemia, PAD.  Echo 11/2022 showed EF 55%, mild mitral regurgitation. Last seen by Sheran Lawless, NP on 11/04/2022 and noted to be doing well from cardiac standpoint. Patient cleared to hold Plavix per telephone encounter from Dr. Burt Knack 12/22/2022 stating, "Reviewed chart. Patient at low risk of holding plavix for noncardiac surgery. His intervention was in 2022 and recommended to take 6 months of DAPT without interruption. He was seen in 10/2022 and had no cardiac complaints at that time. Can hold 5-7 days before surgery as needed."  Patient reports he was instructed to hold Plavix 5 days prior to surgery.  Preop labs reviewed, mild hyponatremia sodium 133, mild anemia with hemoglobin 12.0, otherwise unremarkable.  EKG 11/04/22: Sinus rhythm with first-degree AV block.  Rate 63.  Inferior infarct, age undetermined.  CTA abdomen pelvis 12/29/2022: IMPRESSION: 1. Infrarenal abdominal aortic aneurysm measuring up to 5.0 cm. Aneurysm previously measured up to 4.7 cm. 2. Narrowing of the proximal celiac trunk with a hook like configuration. Findings are likely related to median arcuate ligament compression.   NON-VASCULAR   1. Subtle patchy ground-glass densities at both lung bases. Findings are most compatible with an infectious or inflammatory process of uncertain age. 2. Diffuse bladder wall thickening is likely related to chronic bladder outlet obstruction from the enlarged prostate. 3. Colonic diverticulosis without acute inflammation.  TTE 11/30/2022:  1. Left ventricular ejection fraction, by estimation, is 55%. The left  ventricle has normal function. The left ventricle has no regional wall  motion abnormalities. Left ventricular diastolic parameters are  indeterminate.   2. Right ventricular systolic  function is normal. The right ventricular  size is normal.   3. Left atrial size was mild to moderately dilated.   4. The mitral valve is normal in structure. Mild mitral valve  regurgitation.   5. The aortic valve is tricuspid. Aortic valve regurgitation is trivial.  Aortic valve sclerosis is present, with no evidence of aortic valve  stenosis.   6. Aortic dilatation noted.   Nuclear stress 06/05/2022:   Findings are consistent with prior myocardial infarction. The study is intermediate risk.   No ST deviation was noted.   LV perfusion is abnormal. There is no evidence of ischemia. There is evidence of infarction. Defect 1: There is a large defect with severe reduction in uptake present in the apical to basal inferior location(s) that is fixed. There is abnormal wall motion in the defect area. Consistent with infarction.   Left ventricular function is abnormal. Nuclear stress EF: 41 %. The left ventricular ejection fraction is moderately decreased (30-44%). End diastolic cavity size is severely enlarged. End systolic cavity size is severely enlarged.   Prior study available for comparison from 12/03/2020.   Abnormal, intermediate risk stress nuclear study with large inferior infarct and small distal anterior infarct; no ischemia; gated ejection fraction 41% with hypokinesis of the inferior wall.  Severe left ventricular enlargement.  Study interpreted as intermediate risk due to reduced LV function.    Stephen, [name] Short Stay Center/Anesthesiology Phone [phone] 01/13/2023 2:33 PM

## 2023-01-13 NOTE — Anesthesia Preprocedure Evaluation (Addendum)
Anesthesia Evaluation  Patient identified by MRN, date of birth, ID band Patient awake    Reviewed: Allergy & Precautions, NPO status , Patient's Chart, lab work & pertinent test results, reviewed documented beta blocker date and time   Airway Mallampati: II  TM Distance: >3 FB Neck ROM: Full    Dental  (+) Dental Advisory Given, Missing,    Pulmonary sleep apnea and Continuous Positive Airway Pressure Ventilation , former smoker   Pulmonary exam normal breath sounds clear to auscultation       Cardiovascular hypertension, Pt. on home beta blockers and Pt. on medications + angina  + CAD, + Past MI, + Cardiac Stents (DES to LAD), + CABG and + Peripheral Vascular Disease (Infrarenal abdominal aortic aneurysm without rupture)  Normal cardiovascular exam Rhythm:Regular Rate:Normal     Neuro/Psych  Headaches PSYCHIATRIC DISORDERS Anxiety      Neuromuscular disease    GI/Hepatic Neg liver ROS,GERD  Medicated,,  Endo/Other  negative endocrine ROS    Renal/GU negative Renal ROS     Musculoskeletal  (+) Arthritis ,    Abdominal   Peds  Hematology  (+) Blood dyscrasia (Plavix), anemia   Anesthesia Other Findings Day of surgery medications reviewed with the patient.  Reproductive/Obstetrics                             Anesthesia Physical Anesthesia Plan  ASA: 4  Anesthesia Plan: General   Post-op Pain Management: Tylenol PO (pre-op)*   Induction: Intravenous  PONV Risk Score and Plan: 2 and Dexamethasone, Ondansetron and Treatment may vary due to age or medical condition  Airway Management Planned: Oral ETT and Video Laryngoscope Planned  Additional Equipment: Arterial line  Intra-op Plan:   Post-operative Plan: Extubation in OR  Informed Consent: I have reviewed the patients History and Physical, chart, labs and discussed the procedure including the risks, benefits and alternatives for  the proposed anesthesia with the patient or authorized representative who has indicated his/her understanding and acceptance.     Dental advisory given  Plan Discussed with: CRNA  Anesthesia Plan Comments: (PAT note by Karoline Caldwell, PA-C: Follows with cardiology for history of CAD s/p CABG x 6 in 2002 and DES to LAD 2022, HFmrEF, abdominal aortic aneurysm, OSA on CPAP, hypertension, hyperlipidemia, PAD.  Echo 11/2022 showed EF 55%, mild mitral regurgitation. Last seen by Sheran Lawless, NP on 11/04/2022 and noted to be doing well from cardiac standpoint. Patient cleared to hold Plavix per telephone encounter from Dr. Burt Knack 12/22/2022 stating, "Reviewed chart. Patient at low risk of holding plavix for noncardiac surgery. His intervention was in 2022 and recommended to take 6 months of DAPT without interruption. He was seen in 10/2022 and had no cardiac complaints at that time. Can hold 5-7 days before surgery as needed."  Patient reports he was instructed to hold Plavix 5 days prior to surgery.  Preop labs reviewed, mild hyponatremia sodium 133, mild anemia with hemoglobin 12.0, otherwise unremarkable.  EKG 11/04/22: Sinus rhythm with first-degree AV block.  Rate 63.  Inferior infarct, age undetermined.  CTA abdomen pelvis 12/29/2022: IMPRESSION: 1. Infrarenal abdominal aortic aneurysm measuring up to 5.0 cm. Aneurysm previously measured up to 4.7 cm. 2. Narrowing of the proximal celiac trunk with a hook like configuration. Findings are likely related to median arcuate ligament compression.  NON-VASCULAR  1. Subtle patchy ground-glass densities at both lung bases. Findings are most compatible with an infectious or inflammatory  process of uncertain age. 2. Diffuse bladder wall thickening is likely related to chronic bladder outlet obstruction from the enlarged prostate. 3. Colonic diverticulosis without acute inflammation.  TTE 11/30/2022: 1. Left ventricular ejection fraction, by  estimation, is 55%. The left  ventricle has normal function. The left ventricle has no regional wall  motion abnormalities. Left ventricular diastolic parameters are  indeterminate.  2. Right ventricular systolic function is normal. The right ventricular  size is normal.  3. Left atrial size was mild to moderately dilated.  4. The mitral valve is normal in structure. Mild mitral valve  regurgitation.  5. The aortic valve is tricuspid. Aortic valve regurgitation is trivial.  Aortic valve sclerosis is present, with no evidence of aortic valve  stenosis.  6. Aortic dilatation noted.   Nuclear stress 06/05/2022:  Findings are consistent with prior myocardial infarction. The study is intermediate risk.  No ST deviation was noted.  LV perfusion is abnormal. There is no evidence of ischemia. There is evidence of infarction. Defect 1: There is a large defect with severe reduction in uptake present in the apical to basal inferior location(s) that is fixed. There is abnormal wall motion in the defect area. Consistent with infarction.  Left ventricular function is abnormal. Nuclear stress EF: 41 %. The left ventricular ejection fraction is moderately decreased (30-44%). End diastolic cavity size is severely enlarged. End systolic cavity size is severely enlarged.  Prior study available for comparison from 12/03/2020.  Abnormal, intermediate risk stress nuclear study with large inferior infarct and small distal anterior infarct; no ischemia; gated ejection fraction 41% with hypokinesis of the inferior wall. Severe left ventricular enlargement. Study interpreted as intermediate risk due to reduced LV function.    )        Anesthesia Quick Evaluation

## 2023-01-16 ENCOUNTER — Other Ambulatory Visit: Payer: Self-pay | Admitting: Adult Health

## 2023-01-18 ENCOUNTER — Inpatient Hospital Stay (HOSPITAL_COMMUNITY): Payer: Medicare Other | Admitting: Physician Assistant

## 2023-01-18 ENCOUNTER — Encounter (HOSPITAL_COMMUNITY): Payer: Self-pay | Admitting: Vascular Surgery

## 2023-01-18 ENCOUNTER — Inpatient Hospital Stay (HOSPITAL_COMMUNITY): Payer: Medicare Other

## 2023-01-18 ENCOUNTER — Inpatient Hospital Stay (HOSPITAL_COMMUNITY)
Admission: RE | Admit: 2023-01-18 | Discharge: 2023-01-19 | DRG: 269 | Disposition: A | Discharge status: Home / Self Care | Payer: Medicare Other | Attending: Vascular Surgery | Admitting: Vascular Surgery

## 2023-01-18 ENCOUNTER — Other Ambulatory Visit: Payer: Self-pay

## 2023-01-18 ENCOUNTER — Encounter (HOSPITAL_COMMUNITY)
Admission: RE | Disposition: A | Discharge status: Home / Self Care | Payer: Self-pay | Source: Home / Self Care | Attending: Vascular Surgery

## 2023-01-18 DIAGNOSIS — I25119 Atherosclerotic heart disease of native coronary artery with unspecified angina pectoris: Secondary | ICD-10-CM

## 2023-01-18 DIAGNOSIS — I10 Essential (primary) hypertension: Secondary | ICD-10-CM | POA: Diagnosis not present

## 2023-01-18 DIAGNOSIS — Z87891 Personal history of nicotine dependence: Secondary | ICD-10-CM

## 2023-01-18 DIAGNOSIS — Z880 Allergy status to penicillin: Secondary | ICD-10-CM | POA: Diagnosis not present

## 2023-01-18 DIAGNOSIS — Z83719 Family history of colon polyps, unspecified: Secondary | ICD-10-CM | POA: Diagnosis not present

## 2023-01-18 DIAGNOSIS — F419 Anxiety disorder, unspecified: Secondary | ICD-10-CM | POA: Diagnosis present

## 2023-01-18 DIAGNOSIS — Z955 Presence of coronary angioplasty implant and graft: Secondary | ICD-10-CM

## 2023-01-18 DIAGNOSIS — D649 Anemia, unspecified: Secondary | ICD-10-CM | POA: Diagnosis present

## 2023-01-18 DIAGNOSIS — I252 Old myocardial infarction: Secondary | ICD-10-CM | POA: Diagnosis not present

## 2023-01-18 DIAGNOSIS — I7143 Infrarenal abdominal aortic aneurysm, without rupture: Secondary | ICD-10-CM

## 2023-01-18 DIAGNOSIS — Z881 Allergy status to other antibiotic agents status: Secondary | ICD-10-CM

## 2023-01-18 DIAGNOSIS — Z885 Allergy status to narcotic agent status: Secondary | ICD-10-CM

## 2023-01-18 DIAGNOSIS — G4733 Obstructive sleep apnea (adult) (pediatric): Secondary | ICD-10-CM | POA: Diagnosis present

## 2023-01-18 DIAGNOSIS — Z8249 Family history of ischemic heart disease and other diseases of the circulatory system: Secondary | ICD-10-CM | POA: Diagnosis not present

## 2023-01-18 DIAGNOSIS — E871 Hypo-osmolality and hyponatremia: Secondary | ICD-10-CM | POA: Diagnosis present

## 2023-01-18 DIAGNOSIS — I714 Abdominal aortic aneurysm, without rupture, unspecified: Principal | ICD-10-CM

## 2023-01-18 DIAGNOSIS — Z86010 Personal history of colonic polyps: Secondary | ICD-10-CM | POA: Diagnosis not present

## 2023-01-18 DIAGNOSIS — Z79899 Other long term (current) drug therapy: Secondary | ICD-10-CM

## 2023-01-18 DIAGNOSIS — E785 Hyperlipidemia, unspecified: Secondary | ICD-10-CM | POA: Diagnosis present

## 2023-01-18 DIAGNOSIS — Z981 Arthrodesis status: Secondary | ICD-10-CM | POA: Diagnosis not present

## 2023-01-18 DIAGNOSIS — I2510 Atherosclerotic heart disease of native coronary artery without angina pectoris: Secondary | ICD-10-CM | POA: Diagnosis present

## 2023-01-18 DIAGNOSIS — Z951 Presence of aortocoronary bypass graft: Secondary | ICD-10-CM | POA: Diagnosis not present

## 2023-01-18 HISTORY — PX: ABDOMINAL AORTIC ENDOVASCULAR STENT GRAFT: SHX5707

## 2023-01-18 LAB — APTT: aPTT: 40 seconds — ABNORMAL HIGH (ref 24–36)

## 2023-01-18 LAB — BASIC METABOLIC PANEL
Anion gap: 10 (ref 5–15)
BUN: 8 mg/dL (ref 8–23)
CO2: 23 mmol/L (ref 22–32)
Calcium: 8.8 mg/dL — ABNORMAL LOW (ref 8.9–10.3)
Chloride: 101 mmol/L (ref 98–111)
Creatinine, Ser: 0.78 mg/dL (ref 0.61–1.24)
GFR, Estimated: >60 mL/min (ref >60)
Glucose, Bld: 121 mg/dL — ABNORMAL HIGH (ref 70–99)
Potassium: 3.6 mmol/L (ref 3.5–5.1)
Sodium: 134 mmol/L — ABNORMAL LOW (ref 135–145)

## 2023-01-18 LAB — PROTIME-INR
INR: 1.1 (ref 0.8–1.2)
Prothrombin Time: 14.3 seconds (ref 11.4–15.2)

## 2023-01-18 LAB — POCT ACTIVATED CLOTTING TIME
Activated Clotting Time: 217 seconds
Activated Clotting Time: 228 seconds
Activated Clotting Time: 228 seconds

## 2023-01-18 LAB — MAGNESIUM: Magnesium: 2.2 mg/dL (ref 1.7–2.4)

## 2023-01-18 LAB — CBC
HCT: 30.4 % — ABNORMAL LOW (ref 39.0–52.0)
Hemoglobin: 10.3 g/dL — ABNORMAL LOW (ref 13.0–17.0)
MCH: 31.3 pg (ref 26.0–34.0)
MCHC: 33.9 g/dL (ref 30.0–36.0)
MCV: 92.4 fL (ref 80.0–100.0)
Platelets: 158 10*3/uL (ref 150–400)
RBC: 3.29 MIL/uL — ABNORMAL LOW (ref 4.22–5.81)
RDW: 12.5 % (ref 11.5–15.5)
WBC: 4.1 10*3/uL (ref 4.0–10.5)
nRBC: 0 % (ref 0.0–0.2)

## 2023-01-18 LAB — PREPARE RBC (CROSSMATCH)

## 2023-01-18 SURGERY — INSERTION, ENDOVASCULAR STENT GRAFT, AORTA, ABDOMINAL
Anesthesia: General

## 2023-01-18 MED ORDER — PANTOPRAZOLE SODIUM 40 MG PO TBEC
40.0000 mg | DELAYED_RELEASE_TABLET | Freq: Every day | ORAL | Status: DC
  Filled 2023-01-18: qty 1

## 2023-01-18 MED ORDER — PHENYLEPHRINE HCL-NACL 20-0.9 MG/250ML-% IV SOLN
INTRAVENOUS | Status: DC | PRN
  Administered 2023-01-18: 25 ug/min via INTRAVENOUS

## 2023-01-18 MED ORDER — CARVEDILOL 3.125 MG PO TABS
3.1250 mg | ORAL_TABLET | Freq: Two times a day (BID) | ORAL | Status: DC
  Administered 2023-01-18: 3.125 mg via ORAL
  Filled 2023-01-18 (×2): qty 1

## 2023-01-18 MED ORDER — HEPARIN SODIUM (PORCINE) 5000 UNIT/ML IJ SOLN
5000.0000 [IU] | Freq: Three times a day (TID) | INTRAMUSCULAR | Status: DC
  Administered 2023-01-18 – 2023-01-19 (×2): 5000 [IU] via SUBCUTANEOUS
  Filled 2023-01-18 (×2): qty 1

## 2023-01-18 MED ORDER — DEXAMETHASONE SODIUM PHOSPHATE 10 MG/ML IJ SOLN
INTRAMUSCULAR | Status: DC | PRN
  Administered 2023-01-18: 8 mg via INTRAVENOUS

## 2023-01-18 MED ORDER — METOPROLOL TARTRATE 5 MG/5ML IV SOLN: 2.0000 mg | INTRAVENOUS | Status: DC | PRN

## 2023-01-18 MED ORDER — PROPOFOL 10 MG/ML IV BOLUS
INTRAVENOUS | Status: AC
  Filled 2023-01-18: qty 20

## 2023-01-18 MED ORDER — FENTANYL CITRATE (PF) 250 MCG/5ML IJ SOLN
INTRAMUSCULAR | Status: AC
  Filled 2023-01-18: qty 5

## 2023-01-18 MED ORDER — CHLORHEXIDINE GLUCONATE 0.12 % MT SOLN: 15.0000 mL | Freq: Once | OROMUCOSAL | Status: AC

## 2023-01-18 MED ORDER — GLYCOPYRROLATE 0.2 MG/ML IJ SOLN
INTRAMUSCULAR | Status: DC | PRN
  Administered 2023-01-18: .2 mg via INTRAVENOUS

## 2023-01-18 MED ORDER — POTASSIUM CHLORIDE CRYS ER 20 MEQ PO TBCR: 20.0000 meq | EXTENDED_RELEASE_TABLET | Freq: Every day | ORAL | Status: DC | PRN

## 2023-01-18 MED ORDER — SODIUM CHLORIDE 0.9 % IV SOLN: INTRAVENOUS | Status: DC

## 2023-01-18 MED ORDER — OXYCODONE-ACETAMINOPHEN 5-325 MG PO TABS: 1.0000 | ORAL_TABLET | ORAL | Status: DC | PRN

## 2023-01-18 MED ORDER — VANCOMYCIN HCL IN DEXTROSE 1-5 GM/200ML-% IV SOLN
1000.0000 mg | Freq: Two times a day (BID) | INTRAVENOUS | Status: AC
  Administered 2023-01-18 – 2023-01-19 (×2): 1000 mg via INTRAVENOUS
  Filled 2023-01-18 (×2): qty 200

## 2023-01-18 MED ORDER — HEPARIN 6000 UNIT IRRIGATION SOLUTION
Status: DC | PRN
  Administered 2023-01-18: 1

## 2023-01-18 MED ORDER — MAGNESIUM SULFATE 2 GM/50ML IV SOLN: 2.0000 g | Freq: Every day | INTRAVENOUS | Status: DC | PRN

## 2023-01-18 MED ORDER — PROPOFOL 10 MG/ML IV BOLUS
INTRAVENOUS | Status: DC | PRN
  Administered 2023-01-18: 140 mg via INTRAVENOUS

## 2023-01-18 MED ORDER — BISACODYL 5 MG PO TBEC: 5.0000 mg | DELAYED_RELEASE_TABLET | Freq: Every day | ORAL | Status: DC | PRN

## 2023-01-18 MED ORDER — CLONAZEPAM 0.5 MG PO TABS
1.0000 mg | ORAL_TABLET | Freq: Two times a day (BID) | ORAL | Status: DC
  Administered 2023-01-18: 1 mg via ORAL
  Filled 2023-01-18 (×2): qty 2

## 2023-01-18 MED ORDER — GUAIFENESIN-DM 100-10 MG/5ML PO SYRP: 15.0000 mL | ORAL_SOLUTION | ORAL | Status: DC | PRN

## 2023-01-18 MED ORDER — CLOPIDOGREL BISULFATE 75 MG PO TABS
75.0000 mg | ORAL_TABLET | Freq: Every day | ORAL | Status: DC
  Filled 2023-01-18: qty 1

## 2023-01-18 MED ORDER — EPHEDRINE SULFATE-NACL 50-0.9 MG/10ML-% IV SOSY
PREFILLED_SYRINGE | INTRAVENOUS | Status: DC | PRN
  Administered 2023-01-18 (×3): 5 mg via INTRAVENOUS

## 2023-01-18 MED ORDER — ATORVASTATIN CALCIUM 40 MG PO TABS
40.0000 mg | ORAL_TABLET | Freq: Every day | ORAL | Status: DC
  Filled 2023-01-18: qty 1

## 2023-01-18 MED ORDER — EPHEDRINE 5 MG/ML INJ
INTRAVENOUS | Status: AC
  Filled 2023-01-18: qty 5

## 2023-01-18 MED ORDER — DOCUSATE SODIUM 100 MG PO CAPS
100.0000 mg | ORAL_CAPSULE | Freq: Every day | ORAL | Status: DC
  Filled 2023-01-18: qty 1

## 2023-01-18 MED ORDER — HYDRALAZINE HCL 20 MG/ML IJ SOLN: 5.0000 mg | INTRAMUSCULAR | Status: DC | PRN

## 2023-01-18 MED ORDER — DEXAMETHASONE SODIUM PHOSPHATE 10 MG/ML IJ SOLN
INTRAMUSCULAR | Status: AC
  Filled 2023-01-18: qty 1

## 2023-01-18 MED ORDER — VALACYCLOVIR HCL 500 MG PO TABS: 500.0000 mg | ORAL_TABLET | Freq: Two times a day (BID) | ORAL | Status: DC | PRN

## 2023-01-18 MED ORDER — ACETAMINOPHEN 650 MG RE SUPP: 325.0000 mg | RECTAL | Status: DC | PRN

## 2023-01-18 MED ORDER — IODIXANOL 320 MG/ML IV SOLN
INTRAVENOUS | Status: DC | PRN
  Administered 2023-01-18: 125 mL via INTRA_ARTERIAL

## 2023-01-18 MED ORDER — ACETAMINOPHEN 325 MG PO TABS: 325.0000 mg | ORAL_TABLET | ORAL | Status: DC | PRN

## 2023-01-18 MED ORDER — MIRTAZAPINE 15 MG PO TABS
45.0000 mg | ORAL_TABLET | Freq: Every day | ORAL | Status: DC
  Administered 2023-01-18: 45 mg via ORAL
  Filled 2023-01-18: qty 3

## 2023-01-18 MED ORDER — PHENYLEPHRINE 80 MCG/ML (10ML) SYRINGE FOR IV PUSH (FOR BLOOD PRESSURE SUPPORT)
PREFILLED_SYRINGE | INTRAVENOUS | Status: DC | PRN
  Administered 2023-01-18: 80 ug via INTRAVENOUS

## 2023-01-18 MED ORDER — GABAPENTIN 100 MG PO CAPS
200.0000 mg | ORAL_CAPSULE | Freq: Two times a day (BID) | ORAL | Status: DC
  Administered 2023-01-18: 200 mg via ORAL
  Filled 2023-01-18 (×2): qty 2

## 2023-01-18 MED ORDER — HEPARIN SODIUM (PORCINE) 1000 UNIT/ML IJ SOLN
INTRAMUSCULAR | Status: DC | PRN
  Administered 2023-01-18: 2000 [IU] via INTRAVENOUS
  Administered 2023-01-18: 1000 [IU] via INTRAVENOUS
  Administered 2023-01-18: 7000 [IU] via INTRAVENOUS

## 2023-01-18 MED ORDER — ASPIRIN 81 MG PO TBEC
81.0000 mg | DELAYED_RELEASE_TABLET | Freq: Every day | ORAL | Status: DC
  Filled 2023-01-18: qty 1

## 2023-01-18 MED ORDER — SACUBITRIL-VALSARTAN 49-51 MG PO TABS
1.0000 | ORAL_TABLET | Freq: Two times a day (BID) | ORAL | Status: DC
  Administered 2023-01-18: 1 via ORAL
  Filled 2023-01-18 (×2): qty 1

## 2023-01-18 MED ORDER — ONDANSETRON HCL 4 MG/2ML IJ SOLN
INTRAMUSCULAR | Status: DC | PRN
  Administered 2023-01-18: 4 mg via INTRAVENOUS

## 2023-01-18 MED ORDER — ONDANSETRON HCL 4 MG/2ML IJ SOLN: 4.0000 mg | Freq: Four times a day (QID) | INTRAMUSCULAR | Status: DC | PRN

## 2023-01-18 MED ORDER — ROCURONIUM BROMIDE 10 MG/ML (PF) SYRINGE
PREFILLED_SYRINGE | INTRAVENOUS | Status: DC | PRN
  Administered 2023-01-18: 60 mg via INTRAVENOUS

## 2023-01-18 MED ORDER — NITROGLYCERIN 0.4 MG SL SUBL: 0.4000 mg | SUBLINGUAL_TABLET | SUBLINGUAL | Status: DC | PRN

## 2023-01-18 MED ORDER — ROCURONIUM BROMIDE 10 MG/ML (PF) SYRINGE
PREFILLED_SYRINGE | INTRAVENOUS | Status: AC
  Filled 2023-01-18: qty 10

## 2023-01-18 MED ORDER — TIZANIDINE HCL 4 MG PO TABS: 2.0000 mg | ORAL_TABLET | Freq: Two times a day (BID) | ORAL | Status: DC | PRN

## 2023-01-18 MED ORDER — FENTANYL CITRATE (PF) 100 MCG/2ML IJ SOLN: 25.0000 ug | INTRAMUSCULAR | Status: DC | PRN

## 2023-01-18 MED ORDER — PHENOL 1.4 % MT LIQD: 1.0000 | OROMUCOSAL | Status: DC | PRN

## 2023-01-18 MED ORDER — ONDANSETRON HCL 4 MG/2ML IJ SOLN: 4.0000 mg | Freq: Once | INTRAMUSCULAR | Status: DC | PRN

## 2023-01-18 MED ORDER — HYDROCORTISONE ACETATE 25 MG RE SUPP: 25.0000 mg | Freq: Two times a day (BID) | RECTAL | Status: DC

## 2023-01-18 MED ORDER — PROTAMINE SULFATE 10 MG/ML IV SOLN
INTRAVENOUS | Status: DC | PRN
  Administered 2023-01-18: 50 mg via INTRAVENOUS

## 2023-01-18 MED ORDER — MAGNESIUM OXIDE -MG SUPPLEMENT 400 (240 MG) MG PO TABS
420.0000 mg | ORAL_TABLET | Freq: Every day | ORAL | Status: DC
  Administered 2023-01-18: 400 mg via ORAL
  Filled 2023-01-18: qty 1

## 2023-01-18 MED ORDER — ALFUZOSIN HCL ER 10 MG PO TB24
10.0000 mg | ORAL_TABLET | Freq: Every day | ORAL | Status: DC
  Filled 2023-01-18 (×2): qty 1

## 2023-01-18 MED ORDER — HEPARIN 6000 UNIT IRRIGATION SOLUTION
Status: AC
  Filled 2023-01-18: qty 500

## 2023-01-18 MED ORDER — FENTANYL CITRATE (PF) 250 MCG/5ML IJ SOLN
INTRAMUSCULAR | Status: DC | PRN
  Administered 2023-01-18: 100 ug via INTRAVENOUS

## 2023-01-18 MED ORDER — POLYVINYL ALCOHOL 1.4 % OP SOLN: 1.0000 [drp] | Freq: Every day | OPHTHALMIC | Status: DC | PRN

## 2023-01-18 MED ORDER — LABETALOL HCL 5 MG/ML IV SOLN: 10.0000 mg | INTRAVENOUS | Status: DC | PRN

## 2023-01-18 MED ORDER — HYDROMORPHONE HCL 1 MG/ML IJ SOLN: 0.5000 mg | INTRAMUSCULAR | Status: DC | PRN

## 2023-01-18 MED ORDER — VANCOMYCIN HCL IN DEXTROSE 1-5 GM/200ML-% IV SOLN
1000.0000 mg | INTRAVENOUS | Status: AC
  Administered 2023-01-18: 1000 mg via INTRAVENOUS
  Filled 2023-01-18: qty 200

## 2023-01-18 MED ORDER — SODIUM CHLORIDE 0.9 % IV SOLN: 500.0000 mL | Freq: Once | INTRAVENOUS | Status: DC | PRN

## 2023-01-18 MED ORDER — ACETAMINOPHEN 500 MG PO TABS
1000.0000 mg | ORAL_TABLET | Freq: Once | ORAL | Status: DC
  Filled 2023-01-18: qty 2

## 2023-01-18 MED ORDER — ORAL CARE MOUTH RINSE
15.0000 mL | Freq: Once | OROMUCOSAL | Status: AC
  Administered 2023-01-18: 15 mL via OROMUCOSAL

## 2023-01-18 MED ORDER — LIDOCAINE 2% (20 MG/ML) 5 ML SYRINGE
INTRAMUSCULAR | Status: AC
  Filled 2023-01-18: qty 5

## 2023-01-18 MED ORDER — PROTAMINE SULFATE 10 MG/ML IV SOLN
INTRAVENOUS | Status: AC
  Filled 2023-01-18: qty 10

## 2023-01-18 MED ORDER — SENNOSIDES-DOCUSATE SODIUM 8.6-50 MG PO TABS: 1.0000 | ORAL_TABLET | Freq: Every evening | ORAL | Status: DC | PRN

## 2023-01-18 MED ORDER — HEPARIN SODIUM (PORCINE) 1000 UNIT/ML IJ SOLN
INTRAMUSCULAR | Status: AC
  Filled 2023-01-18: qty 30

## 2023-01-18 MED ORDER — ONDANSETRON HCL 4 MG/2ML IJ SOLN
INTRAMUSCULAR | Status: AC
  Filled 2023-01-18: qty 2

## 2023-01-18 MED ORDER — PHENYLEPHRINE 80 MCG/ML (10ML) SYRINGE FOR IV PUSH (FOR BLOOD PRESSURE SUPPORT)
PREFILLED_SYRINGE | INTRAVENOUS | Status: AC
  Filled 2023-01-18: qty 10

## 2023-01-18 MED ORDER — FLEET ENEMA 7-19 GM/118ML RE ENEM: 1.0000 | ENEMA | Freq: Once | RECTAL | Status: DC | PRN

## 2023-01-18 MED ORDER — SUGAMMADEX SODIUM 200 MG/2ML IV SOLN
INTRAVENOUS | Status: DC | PRN
  Administered 2023-01-18: 200 mg via INTRAVENOUS

## 2023-01-18 MED ORDER — LACTATED RINGERS IV SOLN: INTRAVENOUS | Status: DC | PRN

## 2023-01-18 MED ORDER — LACTATED RINGERS IV SOLN: INTRAVENOUS | Status: DC

## 2023-01-18 MED ORDER — ALUM & MAG HYDROXIDE-SIMETH 200-200-20 MG/5ML PO SUSP: 15.0000 mL | ORAL | Status: DC | PRN

## 2023-01-18 MED ORDER — HYDROCORTISONE (PERIANAL) 2.5 % EX CREA: 1.0000 | TOPICAL_CREAM | Freq: Every day | CUTANEOUS | Status: DC | PRN

## 2023-01-18 MED ORDER — LIDOCAINE 2% (20 MG/ML) 5 ML SYRINGE
INTRAMUSCULAR | Status: DC | PRN
  Administered 2023-01-18: 60 mg via INTRAVENOUS
  Administered 2023-01-18: 80 mg via INTRAVENOUS

## 2023-01-18 SURGICAL SUPPLY — 50 items
ADH SKN CLS APL DERMABOND .7 (GAUZE/BANDAGES/DRESSINGS) ×2
BAG COUNTER SPONGE SURGICOUNT (BAG) ×1 IMPLANT
BAG SPNG CNTER NS LX DISP (BAG) ×1
CANISTER SUCT 3000ML PPV (MISCELLANEOUS) ×1 IMPLANT
CATH BEACON 5.038 65CM KMP-01 (CATHETERS) ×1 IMPLANT
CATH OMNI FLUSH .035X70CM (CATHETERS) ×1 IMPLANT
CLIP LIGATING EXTRA MED SLVR (CLIP) IMPLANT
CLIP LIGATING EXTRA SM BLUE (MISCELLANEOUS) IMPLANT
DERMABOND ADVANCED .7 DNX12 (GAUZE/BANDAGES/DRESSINGS) ×1 IMPLANT
DEVICE CLOSURE PERCLS PRGLD 6F (VASCULAR PRODUCTS) ×4 IMPLANT
DRSG TEGADERM 2-3/8X2-3/4 SM (GAUZE/BANDAGES/DRESSINGS) ×2 IMPLANT
ELECT REM PT RETURN 9FT ADLT (ELECTROSURGICAL) ×2
ELECTRODE REM PT RTRN 9FT ADLT (ELECTROSURGICAL) ×2 IMPLANT
EXCLUDER TNK LEG 28MX14X16 (Endovascular Graft) IMPLANT
EXCLUDER TRUNK LEG 28MX14X16 (Endovascular Graft) ×1 IMPLANT
GAUZE SPONGE 2X2 8PLY STRL LF (GAUZE/BANDAGES/DRESSINGS) ×1 IMPLANT
GLIDEWIRE ADV .035X180CM (WIRE) ×2 IMPLANT
GLOVE BIOGEL PI IND STRL 6.5 (GLOVE) IMPLANT
GLOVE BIOGEL PI IND STRL 7.5 (GLOVE) IMPLANT
GLOVE BIOGEL PI IND STRL 8 (GLOVE) ×1 IMPLANT
GLOVE ECLIPSE 7.0 STRL STRAW (GLOVE) IMPLANT
GOWN STRL REUS W/ TWL LRG LVL3 (GOWN DISPOSABLE) ×3 IMPLANT
GOWN STRL REUS W/TWL 2XL LVL3 (GOWN DISPOSABLE) ×2 IMPLANT
GOWN STRL REUS W/TWL LRG LVL3 (GOWN DISPOSABLE) ×3
GRAFT BALLN CATH 65CM (STENTS) ×1 IMPLANT
KIT BASIN OR (CUSTOM PROCEDURE TRAY) ×1 IMPLANT
KIT DRAIN CSF ACCUDRAIN (MISCELLANEOUS) IMPLANT
KIT TURNOVER KIT B (KITS) ×1 IMPLANT
LEG CONTRALATERAL 16X14.5X14 (Vascular Products) IMPLANT
NS IRRIG 1000ML POUR BTL (IV SOLUTION) ×1 IMPLANT
PACK ENDOVASCULAR (PACKS) ×1 IMPLANT
PAD ARMBOARD 7.5X6 YLW CONV (MISCELLANEOUS) ×2 IMPLANT
PERCLOSE PROGLIDE 6F (VASCULAR PRODUCTS) ×4
SET MICROPUNCTURE 5F STIFF (MISCELLANEOUS) ×1 IMPLANT
SHEATH BRITE TIP 8FR 23CM (SHEATH) ×1 IMPLANT
SHEATH DRYSEAL FLEX 12FR 33CM (SHEATH) IMPLANT
SHEATH DRYSEAL FLEX 18FR 33CM (SHEATH) IMPLANT
SHEATH PINNACLE 8F 10CM (SHEATH) ×1 IMPLANT
STENT GRAFT BALLN CATH 65CM (STENTS) ×1
STOPCOCK MORSE 400PSI 3WAY (MISCELLANEOUS) ×1 IMPLANT
SUT MNCRL AB 4-0 PS2 18 (SUTURE) ×2 IMPLANT
SUT PROLENE 5 0 C 1 24 (SUTURE) IMPLANT
SUT VIC AB 2-0 CTX 36 (SUTURE) IMPLANT
SUT VIC AB 3-0 SH 27 (SUTURE)
SUT VIC AB 3-0 SH 27X BRD (SUTURE) IMPLANT
SYR 20ML LL LF (SYRINGE) ×1 IMPLANT
TOWEL GREEN STERILE (TOWEL DISPOSABLE) ×1 IMPLANT
TRAY FOLEY MTR SLVR 16FR STAT (SET/KITS/TRAYS/PACK) ×1 IMPLANT
TUBING HIGH PRESSURE 120CM (CONNECTOR) ×1 IMPLANT
WIRE BENTSON .035X145CM (WIRE) ×2 IMPLANT
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
## 2023-01-18 NOTE — Transfer of Care (Signed)
Immediate Anesthesia Transfer of Care Note  Patient: [name]  Procedure(s) Performed: ABDOMINAL AORTIC ENDOVASCULAR STENT GRAFT  Patient Location: PACU  Anesthesia Type:General  Level of Consciousness: awake, alert , and oriented  Airway & Oxygen Therapy: Patient Spontanous Breathing  Post-op Assessment: Report given to RN, Post -op Vital signs reviewed and stable, and Patient moving all extremities X 4  Post vital signs: Reviewed and stable  Last Vitals:  Vitals Value Taken Time  BP 121/63 01/18/23 1132  Temp    Pulse 66 01/18/23 1134  Resp 10 01/18/23 1134  SpO2 100 % 01/18/23 1134  Vitals shown include unvalidated device data.  Last Pain:  Vitals:   01/18/23 0828  TempSrc:   PainSc: 0-No pain         Complications: No notable events documented.

## 2023-01-18 NOTE — Progress Notes (Signed)
Mobility Specialist Progress Note:   01/18/23 1632  Mobility  Activity Ambulated with assistance in hallway  Level of Assistance Standby assist, set-up cues, supervision of patient - no hands on  Assistive Device Front wheel walker  Distance Ambulated (ft) 550 ft  Activity Response Tolerated well  Mobility Referral Yes  $Mobility charge 1 Mobility   Pt received in bed and agreeable. No complaints. Pt returned to bed with all needs met and call bell in reach.   Andrey Campanile Mobility Specialist Please contact via SecureChat or  Rehab office at [phone]

## 2023-01-18 NOTE — Progress Notes (Signed)
Doing well postop No hematomas Palpable pulses distally.  No belly pain.   Broadus John MD

## 2023-01-18 NOTE — Op Note (Signed)
NAME: KEYMARI SATO    MRN: [id number] DOB: 1951-08-01    DATE OF OPERATION: 01/18/2023  PREOP DIAGNOSIS:    Asymptomatic AAA  POSTOP DIAGNOSIS:    Same  PROCEDURE:    Percutaneous access of bilateral common femoral arteries ultrasound-guided micropuncture needle Endovascular aortic repair of infrarenal abdominal aneurysm  Gore C3, main body right 28 x 14 x 16 cm Left limb 14 x 14 cm Bilateral common femoral artery angiogram  SURGEON: Broadus John  ASSIST: Edsel Petrin, PA  ANESTHESIA: General  EBL: 30 mL  INDICATIONS:    QUINDARRIUS JOPLIN is a 72 y.o. male presenting with asymptomatic infrarenal abdominal aortic aneurysm.  At last measurement, the aneurysm was 4.7 cm, it is now 5.2 cm by ultrasonography.  The increase in aneurysm size is giving Brent severe anxiety regarding rupture.  If ultrasonography is accurate, this is an increase of half a centimeter and roughly 6 months which is an indication for surgery.  I have ordered repeat CT angiogram in an effort to further define the aneurysm for preoperative planning.   Ruby Cola and I had a long discussion regarding open versus endovascular aortic repair.  I think he is an excellent candidate for endovascular aortic repair.  CT Imaging demonstrates >5.1 AAA, and I discussed waiting until 5.5cm per SVS guidelines. Pt was adamant he needed surgery as he is very worried about rupture.  Cleared by cardiology. AAA in amenable to endovascular aortic repair.  After discussing the risks and benefits of EVAR to prevent rupture, [name] elected to proceed.   FINDINGS:   5.1 cm infrarenal abdominal aortic aneurysm Completion with type II endoleak, right hypogastric artery covered.  TECHNIQUE:   The patient was brought to the operating room after informed consent was obtained. After placement of IV's and arterial line general anesthesia was successfully induced. The patient was prepped and draped in the usual sterile fashion.  Peri-operative antibiotics were given. A time-out was performed.   Bilateral percutaneous access of the CFA was obtained with ultrasound guidance. 2 perclose devices were placed in the pre-close technique and the arteriotomy was up-sized to a 7 french sheath over GWA wires. The patient was heparinized with 5000 units of IV heparin. A pigtale catheter was advanced into the aorta and an aortogram was obtained. This demonstrated widely patent renal arteries bilaterally and a long aortic neck. Bilateral hypogastric arteries were patent. Length measurements from pre-op CT scan were confirmed. The right side sheath was up-sized to an 18 french dry seal sheath over a Glide Advantage wire. A 60mx 141m 16cm GORE c3 main body was positioned in the infrarenal aorta. Another aortogram was performed under magnification to define the renal artery origins. The graft was deployed. The contralateral limb was selected with a vertebral catheter and soft glide wire. Once advanced into the graft and wire withdrawn the vertebral catheter was freely moving. An arteriogram was performed and documented the graft was in a good infrarenal location and that the right renal artery was patent. This was double confirmation that we had selected the contralateral limb. A 12 french sheath was then advanced into the contralateral limb over a Glide Advantage wire. A  1433m 14cm limb was inserted on the left side and deployed per the IFU. The reminder of the main body was deployed. A compliant balloon was used to dilate the main body proximal and distal seal zones as well as the contralateral overlap with the main body and the distal  seal zone. A completion aortogram was obtained which revealed patent renal arteries. The right hypogastic artery was covered. The right hypogastric artery was widely patent. Initially there was a type 1 endoleak. The neck was re-expanded using the compliant balloon. Afterwards there was no evidence of type I A, IB, or  type III endoleak. There was a type-2 endoleak. This was felt to be an excellent result. The arteriotomies were closed over a wire using the previously placed perc-close devices with excellent hemostasis, prior to complete closure bilateral femoral arteriograms were obtained via a micropuncture sheath demonstrating adequate access within the common femoral arteries with no access site complications. Bilateral femoral angiogram showed patent femoral bifurcations without access complication. Pedal pulses were checked and found to be no different from pre-op. 50 mg of protamine was given. The skin was closed with 4-0 monocryl suture. Dry sterile dressing was placed. General anesthesia was successfully terminated. The patient was transported to the recovery room in stable position   Macie Burows, MD [facility] DATE OF DICTATION:   01/18/2023

## 2023-01-18 NOTE — Anesthesia Procedure Notes (Signed)
Procedure Name: Intubation Date/Time: 01/18/2023 9:57 AM  Performed by: Heide Scales, CRNAPre-anesthesia Checklist: Patient identified, Emergency Drugs available, Suction available and Patient being monitored Patient Re-evaluated:Patient Re-evaluated prior to induction Oxygen Delivery Method: Circle system utilized Preoxygenation: Pre-oxygenation with 100% oxygen Induction Type: IV induction Ventilation: Mask ventilation without difficulty Laryngoscope Size: Glidescope and 3 Tube type: Oral Tube size: 7.5 mm Number of attempts: 1 Airway Equipment and Method: Stylet and Oral airway Placement Confirmation: ETT inserted through vocal cords under direct vision, positive ETCO2 and breath sounds checked- equal and bilateral Secured at: 23 cm Tube secured with: Tape Dental Injury: Teeth and Oropharynx as per pre-operative assessment  Comments: Elective glidescope intubation 2/2 history of cervical surgeries. Grade 1 view on glidescope screen

## 2023-01-18 NOTE — Anesthesia Postprocedure Evaluation (Signed)
Anesthesia Post Note  Patient: [name]  Procedure(s) Performed: ABDOMINAL AORTIC ENDOVASCULAR STENT GRAFT     Patient location during evaluation: PACU Anesthesia Type: General Level of consciousness: awake and alert Pain management: pain level controlled Vital Signs Assessment: post-procedure vital signs reviewed and stable Respiratory status: spontaneous breathing, nonlabored ventilation, respiratory function stable and patient connected to nasal cannula oxygen Cardiovascular status: blood pressure returned to baseline and stable Postop Assessment: no apparent nausea or vomiting Anesthetic complications: no   No notable events documented.  Last Vitals:  Vitals:   01/18/23 1431 01/18/23 1445  BP: 136/81 (!) 141/78  Pulse: 74 74  Resp: 15 12  Temp:    SpO2: 99% 100%    Last Pain:  Vitals:   01/18/23 1431  TempSrc:   PainSc: 0-No pain                 Santa Lighter

## 2023-01-19 ENCOUNTER — Other Ambulatory Visit: Payer: Self-pay | Admitting: Cardiovascular Disease

## 2023-01-19 LAB — CBC
HCT: 30.8 % — ABNORMAL LOW (ref 39.0–52.0)
Hemoglobin: 10.9 g/dL — ABNORMAL LOW (ref 13.0–17.0)
MCH: 31.8 pg (ref 26.0–34.0)
MCHC: 35.4 g/dL (ref 30.0–36.0)
MCV: 89.8 fL (ref 80.0–100.0)
Platelets: 188 10*3/uL (ref 150–400)
RBC: 3.43 MIL/uL — ABNORMAL LOW (ref 4.22–5.81)
RDW: 12.3 % (ref 11.5–15.5)
WBC: 8.9 10*3/uL (ref 4.0–10.5)
nRBC: 0 % (ref 0.0–0.2)

## 2023-01-19 LAB — BASIC METABOLIC PANEL
Anion gap: 9 (ref 5–15)
BUN: 8 mg/dL (ref 8–23)
CO2: 25 mmol/L (ref 22–32)
Calcium: 9 mg/dL (ref 8.9–10.3)
Chloride: 102 mmol/L (ref 98–111)
Creatinine, Ser: 0.76 mg/dL (ref 0.61–1.24)
GFR, Estimated: >60 mL/min (ref >60)
Glucose, Bld: 111 mg/dL — ABNORMAL HIGH (ref 70–99)
Potassium: 3.4 mmol/L — ABNORMAL LOW (ref 3.5–5.1)
Sodium: 136 mmol/L (ref 135–145)

## 2023-01-19 MED ORDER — OXYCODONE-ACETAMINOPHEN 5-325 MG PO TABS: 1.0000 | ORAL_TABLET | Freq: Four times a day (QID) | ORAL | 0 refills | Status: DC | PRN

## 2023-01-19 NOTE — Progress Notes (Signed)
Aline Left radial discontinued without difficulty.   Pressure applied with dry dressing

## 2023-01-19 NOTE — Progress Notes (Signed)
Patient and spouse given discharge instructions medication list and follow up appointments. IV and tele were dcd. Will discharge home as ordered. Will be Transported to exit via wheel chair and nursing staff. Clare Casto, Bettina Gavia RN

## 2023-01-19 NOTE — Discharge Instructions (Signed)
  [facility]   Discharge Instructions  Endovascular Aortic Aneurysm Repair  Please refer to the following instructions for your post-procedure care. Your surgeon or Physician Assistant will discuss any changes with you.  Activity  You are encouraged to walk as much as you can. You can slowly return to normal activities but must avoid strenuous activity and heavy lifting until your doctor tells you it's OK. Avoid activities such as vacuuming or swinging a gold club. It is normal to feel tired for several weeks after your surgery. Do not drive until your doctor gives the OK and you are no longer taking prescription pain medications. It is also normal to have difficulty with sleep habits, eating, and bowel movements after surgery. These will go away with time.  Bathing/Showering  Shower daily after you go home.  Do not soak in a bathtub, hot tub, or swim until the incision heals completely.  If you have incisions in your groin, wash the groin wounds with soap and water daily and pat dry. (No tub bath-only shower)  Then put a dry gauze or washcloth there to keep this area dry to help prevent wound infection daily and as needed.  Do not use Vaseline or neosporin on your incisions.  Only use soap and water on your incisions and then protect and keep dry.  Incision Care  Shower every day. Clean your incision with mild soap and water. Pat the area dry with a clean towel. You do not need a bandage unless otherwise instructed. Do not apply any ointments or creams to your incision. If you clothing is irritating, you may cover your incision with a dry gauze pad.  Diet  Resume your normal diet. There are no special food restrictions following this procedure. A low fat/low cholesterol diet is recommended for all patients with vascular disease. In order to heal from your surgery, it is CRITICAL to get adequate nutrition. Your body requires vitamins, minerals, and protein.  Vegetables are the best source of vitamins and minerals. Vegetables also provide the perfect balance of protein. Processed food has little nutritional value, so try to avoid this.  Medications  Resume taking all of your medications unless your doctor or nurse practitioner tells you not to. If your incision is causing pain, you may take over-the-counter pain relievers such as acetaminophen (Tylenol). If you were prescribed a stronger pain medication, please be aware these medications can cause nausea and constipation. Prevent nausea by taking the medication with a snack or meal. Avoid constipation by drinking plenty of fluids and eating foods with a high amount of fiber, such as fruits, vegetables, and grains.  Do not take Tylenol if you are taking prescription pain medications.   Follow up  Our office will schedule a follow-up appointment with a CT scan 3-4 weeks after your surgery.  Please call us immediately for any of the following conditions  Severe or worsening pain in your legs or feet or in your abdomen back or chest. Increased pain, redness, drainage (pus) from your incision site. Increased abdominal pain, bloating, nausea, vomiting or persistent diarrhea. Fever of 101 degrees or higher. Swelling in your leg (s),  Reduce your risk of vascular disease  Stop smoking. If you would like help call [facility] at 1-800-QUIT-NOW (1-800-784-8669) or [place] at [phone]. Manage your cholesterol Maintain a desired weight Control your diabetes Keep your blood pressure down  If you have questions, please call the office at [phone].  

## 2023-01-19 NOTE — Progress Notes (Addendum)
  Progress Note    01/19/2023 7:34 AM 1 Day Post-Op  Subjective:  no complaints. Tolerating diet. Voiding without difficulty. Ambulated in hall. Denies any back or abdominal pain   Vitals:   01/19/23 0432 01/19/23 0721  BP: (!) 154/81 (!) 144/89  Pulse:  81  Resp: 15 16  Temp: 98.6 F (37 C) 98.6 F (37 C)  SpO2: 94% 96%   Physical Exam: Cardiac:  regular Lungs:  non labored Incisions:  bilateral femoral access sites c/d/I without swelling or hematoma Extremities:  well perfused and warm with palpable DP pulses bilaterally Abdomen:  soft, non distended Neurologic: alert and oriented  CBC    Component Value Date/Time   WBC 8.9 01/19/2023 0445   RBC 3.43 (L) 01/19/2023 0445   HGB 10.9 (L) 01/19/2023 0445   HGB 11.9 (L) 12/18/2020 1336   HCT 30.8 (L) 01/19/2023 0445   HCT 35.6 (L) 12/18/2020 1336   PLT 188 01/19/2023 0445   PLT 208 12/18/2020 1336   MCV 89.8 01/19/2023 0445   MCV 89 12/18/2020 1336   MCH 31.8 01/19/2023 0445   MCHC 35.4 01/19/2023 0445   RDW 12.3 01/19/2023 0445   RDW 13.5 12/18/2020 1336   LYMPHSABS 1.9 12/03/2021 0913   MONOABS 0.4 12/03/2021 0913   EOSABS 0.1 12/03/2021 0913   BASOSABS 0.0 12/03/2021 0913    BMET    Component Value Date/Time   NA 136 01/19/2023 0445   NA 135 06/06/2021 0936   K 3.4 (L) 01/19/2023 0445   CL 102 01/19/2023 0445   CO2 25 01/19/2023 0445   GLUCOSE 111 (H) 01/19/2023 0445   BUN 8 01/19/2023 0445   BUN 10 06/06/2021 0936   CREATININE 0.76 01/19/2023 0445   CALCIUM 9.0 01/19/2023 0445   GFRNONAA >60 01/19/2023 0445   GFRAA 105 12/18/2020 1336    INR    Component Value Date/Time   INR 1.1 01/18/2023 1138     Intake/Output Summary (Last 24 hours) at 01/19/2023 0734 Last data filed at 01/19/2023 6553 Gross per 24 hour  Intake 1200 ml  Output 2850 ml  Net -1650 ml     Assessment/Plan:  72 y.o. male is s/p EVAR 1 Day Post-Op   No back or abdominal pain Bilateral femoral access sites without  swelling or hematoma BLE well perfused and warm with palpable DP pulses bilaterally Hemodynamically stable Continue Aspirin, Statin, Plavix Follow up in 1 month with Dr. Virl Cagey with CTA abd/ pelvis    Karoline Caldwell, PA-C [facility] [phone] 01/19/2023 7:34 AM  VASCULAR STAFF ADDENDUM: I have independently interviewed and examined the patient. I agree with the above.  Home today   Cassandria Santee, MD [facility] Phone Number: [phone] 01/19/2023 7:57 AM

## 2023-01-20 ENCOUNTER — Telehealth: Payer: Self-pay
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
## 2023-01-20 NOTE — Patient Outreach (Signed)
  Care Coordination TOC Note Transition Care Management Follow-up Telephone Call Date of discharge and from where: [name] 01/19/23 How have you been since you were released from the hospital? "For the most part I feel pretty good" Any questions or concerns? No  Items Reviewed: Did the pt receive and understand the discharge instructions provided? Yes  Medications obtained and verified? Yes  Other? No  Any new allergies since your discharge? No  Dietary orders reviewed? No Do you have support at home? Yes   Home Care and Equipment/Supplies: Were home health services ordered? no If so, what is the name of the agency? N/a  Has the agency set up a time to come to the patient's home? not applicable Were any new equipment or medical supplies ordered?  No What is the name of the medical supply agency? N/a Were you able to get the supplies/equipment? not applicable Do you have any questions related to the use of the equipment or supplies? No  Functional Questionnaire: (I = Independent and D = Dependent) ADLs: I  Bathing/Dressing- I  Meal Prep- I  Eating- I  Maintaining continence- I  Transferring/Ambulation- I  Managing Meds- I  Follow up appointments reviewed:  PCP Hospital f/u appt confirmed? No   Specialist Hospital f/u appt confirmed? Yes  Scheduled to see Dr. Unk Lightning on 02/26/23 @ 2:20. Are transportation arrangements needed? No  If their condition worsens, is the pt aware to call PCP or go to the Emergency Dept.? Yes Was the patient provided with contact information for the PCP's office or ED? Yes Was to pt encouraged to call back with questions or concerns? Yes  SDOH assessments and interventions completed:   Yes SDOH Interventions Today    Flowsheet Row Most Recent Value  SDOH Interventions   Food Insecurity Interventions Intervention Not Indicated  Housing Interventions Intervention Not Indicated       Care Coordination Interventions:  No Care Coordination  interventions needed at this time.   Encounter Outcome:  Pt. Visit Completed

## 2023-01-22 ENCOUNTER — Encounter (HOSPITAL_COMMUNITY): Payer: Self-pay | Admitting: Vascular Surgery

## 2023-01-22 LAB — TYPE AND SCREEN
ABO/RH(D): O POS
Antibody Screen: NEGATIVE
Unit division: 0
Unit division: 0
Unit division: 0
Unit division: 0

## 2023-01-22 LAB — BPAM RBC
Blood Product Expiration Date: 202403092359
Blood Product Expiration Date: 202403092359
Blood Product Expiration Date: 202403092359
Blood Product Expiration Date: 202403092359
ISSUE DATE / TIME: 202402050943
ISSUE DATE / TIME: 202402050943
ISSUE DATE / TIME: 202402050943
ISSUE DATE / TIME: 202402050943
Unit Type and Rh: 5100
Unit Type and Rh: 5100
Unit Type and Rh: 5100
Unit Type and Rh: 5100

## 2023-02-01 ENCOUNTER — Other Ambulatory Visit: Payer: Self-pay | Admitting: Adult Health

## 2023-02-07 ENCOUNTER — Other Ambulatory Visit: Payer: Self-pay | Admitting: Adult Health

## 2023-02-10 ENCOUNTER — Other Ambulatory Visit: Payer: Self-pay | Admitting: *Deleted

## 2023-02-15 ENCOUNTER — Telehealth (HOSPITAL_BASED_OUTPATIENT_CLINIC_OR_DEPARTMENT_OTHER): Payer: Self-pay | Admitting: Family Medicine

## 2023-02-15 NOTE — Telephone Encounter (Signed)
Called patient to schedule Medicare Annual Wellness Visit (AWV). Left message for patient to call back and schedule Medicare Annual Wellness Visit (AWV).  Last date of AWV: 08/20/2021   Please schedule an appointment at any time with [place], [facility].  If any questions, please contact me at [phone].   Thank you,  [place] Direct dial  [phone]

## 2023-02-18 ENCOUNTER — Telehealth (HOSPITAL_BASED_OUTPATIENT_CLINIC_OR_DEPARTMENT_OTHER): Payer: Self-pay | Admitting: Family Medicine

## 2023-02-18 NOTE — Telephone Encounter (Signed)
Contacted [name] to schedule their annual wellness visit. Appointment made for 03/02/2023.  Thank you,  [place] Direct dial  [phone]

## 2023-02-24 ENCOUNTER — Encounter (HOSPITAL_BASED_OUTPATIENT_CLINIC_OR_DEPARTMENT_OTHER): Payer: Self-pay

## 2023-02-26 ENCOUNTER — Encounter: Payer: Medicare Other | Admitting: Vascular Surgery

## 2023-02-28 ENCOUNTER — Other Ambulatory Visit: Payer: Self-pay | Admitting: Neurology

## 2023-03-02 ENCOUNTER — Telehealth: Payer: Self-pay | Admitting: Neurology

## 2023-03-02 ENCOUNTER — Ambulatory Visit (INDEPENDENT_AMBULATORY_CARE_PROVIDER_SITE_OTHER): Payer: Medicare Other

## 2023-03-02 ENCOUNTER — Encounter (HOSPITAL_BASED_OUTPATIENT_CLINIC_OR_DEPARTMENT_OTHER): Payer: Self-pay

## 2023-03-02 VITALS — Ht 75.0 in | Wt 158.0 lb

## 2023-03-02 DIAGNOSIS — Z0000 Encounter for general adult medical examination without abnormal findings: Secondary | ICD-10-CM | POA: Diagnosis not present
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
## 2023-03-02 NOTE — Telephone Encounter (Signed)
error 

## 2023-03-02 NOTE — Patient Instructions (Addendum)
[name] , Thank you for taking time to come for your Medicare Wellness Visit. I appreciate your ongoing commitment to your health goals. Please review the following plan we discussed and let me know if I can assist you in the future.   These are the goals we discussed:  Goals       Patient Stated (pt-stated)      Maintain current healthy lifestyle.        This is a list of the screening recommended for you and due dates:  Health Maintenance  Topic Date Due   DTaP/Tdap/Td vaccine (2 - Td or Tdap) 06/10/2022   Flu Shot  03/14/2023*   COVID-19 Vaccine (1) 03/18/2023*   Colon Cancer Screening  03/01/2024*   Medicare Annual Wellness Visit  03/01/2024   Pneumonia Vaccine  Completed   Hepatitis C Screening: USPSTF Recommendation to screen - Ages 18-79 yo.  Completed   Zoster (Shingles) Vaccine  Completed   HPV Vaccine  Aged Out  *Topic was postponed. The date shown is not the original due date.    Advanced directives: Advance directive discussed with you today. Even though you declined this today, please call our office should you change your mind, and we can give you the proper paperwork for you to fill out.   Conditions/risks identified: None  Next appointment: Follow up in one year for your annual wellness visit.   Preventive Care 77 Years and Older, Male  Preventive care refers to lifestyle choices and visits with your health care provider that can promote health and wellness. What does preventive care include? A yearly physical exam. This is also called an annual well check. Dental exams once or twice a year. Routine eye exams. Ask your health care provider how often you should have your eyes checked. Personal lifestyle choices, including: Daily care of your teeth and gums. Regular physical activity. Eating a healthy diet. Avoiding tobacco and drug use. Limiting alcohol use. Practicing safe sex. Taking low doses of aspirin every day. Taking vitamin and mineral  supplements as recommended by your health care provider. What happens during an annual well check? The services and screenings done by your health care provider during your annual well check will depend on your age, overall health, lifestyle risk factors, and family history of disease. Counseling  Your health care provider may ask you questions about your: Alcohol use. Tobacco use. Drug use. Emotional well-being. Home and relationship well-being. Sexual activity. Eating habits. History of falls. Memory and ability to understand (cognition). Work and work Statistician. Screening  You may have the following tests or measurements: Height, weight, and BMI. Blood pressure. Lipid and cholesterol levels. These may be checked every 5 years, or more frequently if you are over 73 years old. Skin check. Lung cancer screening. You may have this screening every year starting at age 71 if you have a 30-pack-year history of smoking and currently smoke or have quit within the past 15 years. Fecal occult blood test (FOBT) of the stool. You may have this test every year starting at age 68. Flexible sigmoidoscopy or colonoscopy. You may have a sigmoidoscopy every 5 years or a colonoscopy every 10 years starting at age 67. Prostate cancer screening. Recommendations will vary depending on your family history and other risks. Hepatitis C blood test. Hepatitis B blood test. Sexually transmitted disease (STD) testing. Diabetes screening. This is done by checking your blood sugar (glucose) after you have not eaten for a while (fasting). You may have this  done every 1-3 years. Abdominal aortic aneurysm (AAA) screening. You may need this if you are a current or former smoker. Osteoporosis. You may be screened starting at age 64 if you are at high risk. Talk with your health care provider about your test results, treatment options, and if necessary, the need for more tests. Vaccines  Your health care provider  may recommend certain vaccines, such as: Influenza vaccine. This is recommended every year. Tetanus, diphtheria, and acellular pertussis (Tdap, Td) vaccine. You may need a Td booster every 10 years. Zoster vaccine. You may need this after age 29. Pneumococcal 13-valent conjugate (PCV13) vaccine. One dose is recommended after age 1. Pneumococcal polysaccharide (PPSV23) vaccine. One dose is recommended after age 11. Talk to your health care provider about which screenings and vaccines you need and how often you need them. This information is not intended to replace advice given to you by your health care provider. Make sure you discuss any questions you have with your health care provider. Document Released: 12/27/2015 Document Revised: 08/19/2016 Document Reviewed: 10/01/2015 Elsevier Interactive Patient Education  2017 [place] Prevention in the Home Falls can cause injuries. They can happen to people of all ages. There are many things you can do to make your home safe and to help prevent falls. What can I do on the outside of my home? Regularly fix the edges of walkways and driveways and fix any cracks. Remove anything that might make you trip as you walk through a door, such as a raised step or threshold. Trim any bushes or trees on the path to your home. Use bright outdoor lighting. Clear any walking paths of anything that might make someone trip, such as rocks or tools. Regularly check to see if handrails are loose or broken. Make sure that both sides of any steps have handrails. Any raised decks and porches should have guardrails on the edges. Have any leaves, snow, or ice cleared regularly. Use sand or salt on walking paths during winter. Clean up any spills in your garage right away. This includes oil or grease spills. What can I do in the bathroom? Use night lights. Install grab bars by the toilet and in the tub and shower. Do not use towel bars as grab bars. Use  non-skid mats or decals in the tub or shower. If you need to sit down in the shower, use a plastic, non-slip stool. Keep the floor dry. Clean up any water that spills on the floor as soon as it happens. Remove soap buildup in the tub or shower regularly. Attach bath mats securely with double-sided non-slip rug tape. Do not have throw rugs and other things on the floor that can make you trip. What can I do in the bedroom? Use night lights. Make sure that you have a light by your bed that is easy to reach. Do not use any sheets or blankets that are too big for your bed. They should not hang down onto the floor. Have a firm chair that has side arms. You can use this for support while you get dressed. Do not have throw rugs and other things on the floor that can make you trip. What can I do in the kitchen? Clean up any spills right away. Avoid walking on wet floors. Keep items that you use a lot in easy-to-reach places. If you need to reach something above you, use a strong step stool that has a grab bar. Keep electrical cords out of  the way. Do not use floor polish or wax that makes floors slippery. If you must use wax, use non-skid floor wax. Do not have throw rugs and other things on the floor that can make you trip. What can I do with my stairs? Do not leave any items on the stairs. Make sure that there are handrails on both sides of the stairs and use them. Fix handrails that are broken or loose. Make sure that handrails are as long as the stairways. Check any carpeting to make sure that it is firmly attached to the stairs. Fix any carpet that is loose or worn. Avoid having throw rugs at the top or bottom of the stairs. If you do have throw rugs, attach them to the floor with carpet tape. Make sure that you have a light switch at the top of the stairs and the bottom of the stairs. If you do not have them, ask someone to add them for you. What else can I do to help prevent falls? Wear  shoes that: Do not have high heels. Have rubber bottoms. Are comfortable and fit you well. Are closed at the toe. Do not wear sandals. If you use a stepladder: Make sure that it is fully opened. Do not climb a closed stepladder. Make sure that both sides of the stepladder are locked into place. Ask someone to hold it for you, if possible. Clearly mark and make sure that you can see: Any grab bars or handrails. First and last steps. Where the [place] of each step is. Use tools that help you move around (mobility aids) if they are needed. These include: Canes. Walkers. Scooters. Crutches. Turn on the lights when you go into a dark area. Replace any light bulbs as soon as they burn out. Set up your furniture so you have a clear path. Avoid moving your furniture around. If any of your floors are uneven, fix them. If there are any pets around you, be aware of where they are. Review your medicines with your doctor. Some medicines can make you feel dizzy. This can increase your chance of falling. Ask your doctor what other things that you can do to help prevent falls. This information is not intended to replace advice given to you by your health care provider. Make sure you discuss any questions you have with your health care provider. Document Released: 09/26/2009 Document Revised: 05/07/2016 Document Reviewed: 01/04/2015 Elsevier Interactive Patient Education  2017 [employer].

## 2023-03-02 NOTE — Telephone Encounter (Signed)
Pt called wanting to know when this medication will be filled for him. Pt states he is completely out.

## 2023-03-03 ENCOUNTER — Telehealth: Payer: Self-pay | Admitting: Neurology

## 2023-03-03 MED ORDER — CLONAZEPAM 0.5 MG PO TABS: 1.0000 mg | ORAL_TABLET | Freq: Two times a day (BID) | ORAL | 1 refills | Status: DC

## 2023-03-03 NOTE — Telephone Encounter (Signed)
Patient called and stated his phone calls to refill his medications have been ignored, he had called and left messages with on-call physician on Sunday.

## 2023-03-03 NOTE — Telephone Encounter (Signed)
Pt called wanting to know when this medication will be filled for him. Pt states he is completely out.

## 2023-03-15 ENCOUNTER — Telehealth: Payer: Self-pay

## 2023-03-15 ENCOUNTER — Other Ambulatory Visit: Payer: Self-pay

## 2023-03-15 DIAGNOSIS — I7143 Infrarenal abdominal aortic aneurysm, without rupture: Secondary | ICD-10-CM

## 2023-03-15 NOTE — Telephone Encounter (Signed)
   Pre-operative Risk Assessment    Patient Name: KASHEEM MAZZARELLA  DOB: November 25, 1951 MRN: [id number]     Request for Surgical Clearance    Procedure:  Dental Extraction - Amount of Teeth to be Pulled:  1  Date of Surgery:  Clearance 03/25/23                                 Surgeon:  Marlynn Perking LISTED Surgeon's Group or Practice Name:  [facility]  Phone number:  [phone] Fax number:  [phone]   Type of Clearance Requested:   - Pharmacy:  Hold Aspirin, Clopidogrel (Plavix), and ENTRESTO  INSTRUCTIONS WHEN TO HOLD   Type of Anesthesia:  Local    Additional requests/questions:    Signed, Jacinta Shoe   03/15/2023, 2:20 PM

## 2023-03-15 NOTE — Telephone Encounter (Signed)
    Primary Cardiologist: Sherren Mocha, MD  Chart reviewed as part of pre-operative protocol coverage. Simple dental extractions are considered low risk procedures per guidelines and generally do not require any specific cardiac clearance. It is also generally accepted that for simple extractions and dental cleanings, there is no need to interrupt blood thinner therapy.   SBE prophylaxis is not required for the patient.  I will route this recommendation to the requesting party via Epic fax function and remove from pre-op pool.  Please call with questions.  Mable Fill, Marissa Nestle, NP 03/15/2023, 2:48 PM

## 2023-03-17 ENCOUNTER — Encounter: Payer: Self-pay | Admitting: Internal Medicine

## 2023-03-17 ENCOUNTER — Ambulatory Visit (HOSPITAL_BASED_OUTPATIENT_CLINIC_OR_DEPARTMENT_OTHER)
Admission: RE | Admit: 2023-03-17 | Discharge: 2023-03-17 | Disposition: A | Discharge status: Home / Self Care | Payer: Medicare Other | Source: Ambulatory Visit | Attending: Vascular Surgery | Admitting: Vascular Surgery

## 2023-03-17 ENCOUNTER — Ambulatory Visit (INDEPENDENT_AMBULATORY_CARE_PROVIDER_SITE_OTHER): Payer: Medicare Other | Admitting: Internal Medicine

## 2023-03-17 VITALS — BP 120/62 | HR 68 | Ht 75.0 in | Wt 158.0 lb

## 2023-03-17 DIAGNOSIS — K648 Other hemorrhoids: Secondary | ICD-10-CM | POA: Diagnosis not present

## 2023-03-17 DIAGNOSIS — I7143 Infrarenal abdominal aortic aneurysm, without rupture: Secondary | ICD-10-CM | POA: Insufficient documentation

## 2023-03-17 DIAGNOSIS — I714 Abdominal aortic aneurysm, without rupture, unspecified: Secondary | ICD-10-CM | POA: Diagnosis not present

## 2023-03-17 DIAGNOSIS — Z86010 Personal history of colonic polyps: Secondary | ICD-10-CM | POA: Diagnosis not present

## 2023-03-17 DIAGNOSIS — Z7902 Long term (current) use of antithrombotics/antiplatelets: Secondary | ICD-10-CM

## 2023-03-17 LAB — POCT I-STAT CREATININE: Creatinine, Ser: 0.9 mg/dL (ref 0.61–1.24)

## 2023-03-17 MED ORDER — NA SULFATE-K SULFATE-MG SULF 17.5-3.13-1.6 GM/177ML PO SOLN: 1.0000 | Freq: Once | ORAL | 0 refills | Status: AC

## 2023-03-17 MED ORDER — IOHEXOL 350 MG/ML SOLN
100.0000 mL | Freq: Once | INTRAVENOUS | Status: AC | PRN
  Administered 2023-03-17: 70 mL via INTRAVENOUS
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
## 2023-03-17 NOTE — Patient Instructions (Signed)
You have been scheduled for a colonoscopy. Please follow written instructions given to you at your visit today.  Please pick up your prep supplies at the pharmacy within the next 1-3 days. If you use inhalers (even only as needed), please bring them with you on the day of your procedure.  _______________________________________________________  If your blood pressure at your visit was 140/90 or greater, please contact your primary care physician to follow up on this.  _______________________________________________________  If you are age 15 or older, your body mass index should be between 23-30. Your Body mass index is 19.75 kg/m. If this is out of the aforementioned range listed, please consider follow up with your Primary Care Provider.  If you are age 66 or younger, your body mass index should be between 19-25. Your Body mass index is 19.75 kg/m. If this is out of the aformentioned range listed, please consider follow up with your Primary Care Provider.   ________________________________________________________  The [facility] providers would like to encourage you to use [facility] to communicate with providers for non-urgent requests or questions.  Due to long hold times on the telephone, sending your provider a message by [facility] may be a faster and more efficient way to get a response.  Please allow 48 business hours for a response.  Please remember that this is for non-urgent requests.  _______________________________________________________ It was a pleasure to see you today!  Thank you for trusting me with your gastrointestinal care!
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
## 2023-03-19 ENCOUNTER — Ambulatory Visit (INDEPENDENT_AMBULATORY_CARE_PROVIDER_SITE_OTHER): Payer: Medicare Other | Admitting: Vascular Surgery

## 2023-03-19 ENCOUNTER — Encounter: Payer: Self-pay | Admitting: Vascular Surgery

## 2023-03-19 VITALS — BP 111/69 | HR 60 | Temp 97.9°F | Resp 20 | Ht 75.0 in | Wt 161.0 lb

## 2023-03-19 DIAGNOSIS — Z9889 Other specified postprocedural states: Secondary | ICD-10-CM

## 2023-03-19 DIAGNOSIS — Z8679 Personal history of other diseases of the circulatory system: Secondary | ICD-10-CM

## 2023-03-23 ENCOUNTER — Other Ambulatory Visit: Payer: Self-pay

## 2023-03-23 DIAGNOSIS — Z8679 Personal history of other diseases of the circulatory system: Secondary | ICD-10-CM

## 2023-05-07 ENCOUNTER — Encounter: Payer: Self-pay | Admitting: Cardiovascular Disease

## 2023-05-07 ENCOUNTER — Other Ambulatory Visit: Payer: Self-pay | Admitting: Cardiovascular Disease

## 2023-05-07 ENCOUNTER — Ambulatory Visit: Payer: Medicare Other | Attending: Cardiovascular Disease | Admitting: Cardiovascular Disease

## 2023-05-07 ENCOUNTER — Ambulatory Visit: Payer: Medicare Other | Attending: Physician Assistant

## 2023-05-07 VITALS — BP 114/70 | HR 68 | Ht 75.0 in | Wt 156.0 lb

## 2023-05-07 DIAGNOSIS — Z79899 Other long term (current) drug therapy: Secondary | ICD-10-CM | POA: Diagnosis not present

## 2023-05-07 DIAGNOSIS — I2510 Atherosclerotic heart disease of native coronary artery without angina pectoris: Secondary | ICD-10-CM | POA: Diagnosis not present

## 2023-05-07 DIAGNOSIS — E782 Mixed hyperlipidemia: Secondary | ICD-10-CM | POA: Diagnosis not present

## 2023-05-07 DIAGNOSIS — I7143 Infrarenal abdominal aortic aneurysm, without rupture: Secondary | ICD-10-CM | POA: Diagnosis not present

## 2023-05-07 NOTE — Patient Instructions (Addendum)
Medication Instructions:  Your physician recommends that you continue on your current medications as directed. Please refer to the Current Medication list given to you today.  *If you need a refill on your cardiac medications before your next appointment, please call your pharmacy*  Lab Work: Your physician recommends that you have lab work today- CMET and lipid panel.  If you have labs (blood work) drawn today and your tests are completely normal, you will receive your results only by: MyChart Message (if you have MyChart) OR A paper copy in the mail If you have any lab test that is abnormal or we need to change your treatment, we will call you to review the results.  Follow-Up: At [facility], you and your health needs are our priority.  As part of our continuing mission to provide you with exceptional heart care, we have created designated Provider Care Teams.  These Care Teams include your primary Cardiologist (physician) and Advanced Practice Providers (APPs -  Physician Assistants and Nurse Practitioners) who all work together to provide you with the care you need, when you need it.  We recommend signing up for the patient portal called "MyChart".  Sign up information is provided on this After Visit Summary.  MyChart is used to connect with patients for Virtual Visits (Telemedicine).  Patients are able to view lab/test results, encounter notes, upcoming appointments, etc.  Non-urgent messages can be sent to your provider as well.   To learn more about what you can do with MyChart, go to [email or web address].    Your next appointment:   6 month(s)  Provider:   Tereso Newcomer, PA-C     Then, Tonny Bollman, MD will plan to see you again in 1 year(s).

## 2023-05-07 NOTE — Progress Notes (Signed)
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 (physician) and Advanced Practice Providers (APPs -  Physician Assistants and Nurse Practitioners) who all work together to provide you with the care you need, when you need it.  We recommend signing up for the patient portal called "MyChart".  Sign up information is provided on this After Visit Summary.  MyChart is used to connect with patients for Virtual Visits (Telemedicine).  Patients are able to view lab/test results, encounter notes, upcoming appointments, etc.  Non-urgent messages can be sent to your provider as well.   To learn more about what you can do with MyChart, go to [email or web address].    Your next appointment:   6 month(s)  Provider:   Tereso Newcomer, PA-C     Then, Tonny Bollman, MD will plan to see you again in 1 year(s).       Signed, Tonny Bollman, MD  05/07/2023 6:00 PM     [facility]

## 2023-05-08 LAB — LIPID PANEL
Chol/HDL Ratio: 2.8 ratio (ref 0.0–5.0)
Cholesterol, Total: 130 mg/dL (ref 100–199)
HDL: 47 mg/dL (ref >39)
LDL Chol Calc (NIH): 66 mg/dL (ref 0–99)
Triglycerides: 87 mg/dL (ref 0–149)
VLDL Cholesterol Cal: 17 mg/dL (ref 5–40)

## 2023-05-08 LAB — HEPATIC FUNCTION PANEL
ALT: 19 IU/L (ref 0–44)
AST: 13 IU/L (ref 0–40)
Albumin: 4.9 g/dL — ABNORMAL HIGH (ref 3.8–4.8)
Alkaline Phosphatase: 75 IU/L (ref 44–121)
Bilirubin Total: 0.3 mg/dL (ref 0.0–1.2)
Bilirubin, Direct: 0.12 mg/dL (ref 0.00–0.40)
Total Protein: 7.4 g/dL (ref 6.0–8.5)

## 2023-05-11 ENCOUNTER — Telehealth: Payer: Self-pay

## 2023-05-11 NOTE — Telephone Encounter (Signed)
**Note De-identified Luccas Towell Obfuscation** LMTCB

## 2023-05-11 NOTE — Telephone Encounter (Signed)
**Note De-identified Skylan Lara Obfuscation** -----  **Note De-Identified Jennyfer Nickolson Obfuscation** Message from Ethelda Chick, RN sent at 05/07/2023  1:23 PM EDT ----- Patient given assistance info for entresto. Can you follow-up?

## 2023-05-18 NOTE — Telephone Encounter (Signed)
**Note De-Identified [name] Obfuscation** The pt returned my call and advised me that he does not feel that he needs Entresto assistance at this time. He states that he will call us back if this changes.

## 2023-05-20 ENCOUNTER — Encounter: Payer: Self-pay | Admitting: Internal Medicine

## 2023-05-20 ENCOUNTER — Ambulatory Visit (AMBULATORY_SURGERY_CENTER): Payer: Medicare Other | Admitting: Internal Medicine

## 2023-05-20 VITALS — BP 121/53 | HR 66 | Temp 98.5°F | Resp 16 | Ht 75.0 in | Wt 158.0 lb

## 2023-05-20 DIAGNOSIS — Z860100 Personal history of colon polyps, unspecified: Secondary | ICD-10-CM

## 2023-05-20 DIAGNOSIS — Z09 Encounter for follow-up examination after completed treatment for conditions other than malignant neoplasm: Secondary | ICD-10-CM

## 2023-05-20 DIAGNOSIS — K635 Polyp of colon: Secondary | ICD-10-CM | POA: Diagnosis not present

## 2023-05-20 DIAGNOSIS — D125 Benign neoplasm of sigmoid colon: Secondary | ICD-10-CM

## 2023-05-20 DIAGNOSIS — G4730 Sleep apnea, unspecified: Secondary | ICD-10-CM | POA: Diagnosis not present

## 2023-05-20 DIAGNOSIS — I2510 Atherosclerotic heart disease of native coronary artery without angina pectoris: Secondary | ICD-10-CM | POA: Diagnosis not present

## 2023-05-20 MED ORDER — SODIUM CHLORIDE 0.9 % IV SOLN: 500.0000 mL | Freq: Once | INTRAVENOUS | Status: DC

## 2023-05-20 NOTE — Op Note (Signed)
[facility] Patient Name: [name] Procedure Date: 05/20/2023 3:11 PM MRN: [id number] Endoscopist: Wilhemina Bonito. Marina Goodell , MD, [id number] Age: 72 Referring MD:  Date of Birth: 1951-10-08 Gender: Male Account #: [id number] Procedure:                Colonoscopy with cold snare polypectomy x 1 Indications:              High risk colon cancer surveillance: Personal                            history of multiple (3 or more) adenomas, High risk                            colon cancer surveillance: Personal history of                            sessile serrated colon polyp (less than 10 mm in                            size) with no dysplasia. Previous examinations                            2012, 2018.                           Also, complains of prolapsing hemorrhoids. Inquires                            about the possibility of banding Medicines:                Monitored Anesthesia Care Procedure:                Pre-Anesthesia Assessment:                           - Prior to the procedure, a History and Physical                            was performed, and patient medications and                            allergies were reviewed. The patient's tolerance of                            previous anesthesia was also reviewed. The risks                            and benefits of the procedure and the sedation                            options and risks were discussed with the patient.                            All questions were answered, and informed consent  was obtained. Prior Anticoagulants: The patient has                            taken Plavix (clopidogrel), last dose was 2 days                            prior to procedure. ASA Grade Assessment: III - A                            patient with severe systemic disease. After                            reviewing the risks and benefits, the patient was                            deemed in satisfactory  condition to undergo the                            procedure.                           After obtaining informed consent, the colonoscope                            was passed under direct vision. Throughout the                            procedure, the patient's blood pressure, pulse, and                            oxygen saturations were monitored continuously. The                            Olympus CF-HQ190L [id number]) Colonoscope was                            introduced through the anus and advanced to the the                            cecum, identified by appendiceal orifice and                            ileocecal valve. The ileocecal valve, appendiceal                            orifice, and rectum were photographed. The quality                            of the bowel preparation was excellent. The                            colonoscopy was performed without difficulty. The  patient tolerated the procedure well. The bowel                            preparation used was SUPREP via split dose                            instruction. Scope In: 3:20:15 PM Scope Out: 3:38:47 PM Scope Withdrawal Time: 0 hours 13 minutes 57 seconds  Total Procedure Duration: 0 hours 18 minutes 32 seconds  Findings:                 A 4 mm polyp was found in the sigmoid colon. The                            polyp was removed with a cold snare. Resection and                            retrieval were complete.                           Multiple diverticula were found in the left colon.                           Internal hemorrhoids were found during                            retroflexion. The hemorrhoids were moderate.                           The exam was otherwise without abnormality on                            direct and retroflexion views. Complications:            No immediate complications. Estimated blood loss:                            None. Estimated Blood Loss:      Estimated blood loss: none. Impression:               - One 4 mm polyp in the sigmoid colon, removed with                            a cold snare. Resected and retrieved.                           - Diverticulosis in the left colon.                           - Internal hemorrhoids. Amenable to banding.                           - The examination was otherwise normal on direct                            and retroflexion views. Recommendation:           -  Repeat colonoscopy in 5 years for surveillance.                           - Resume Plavix (clopidogrel) today at prior dose.                           - Patient has a contact number available for                            emergencies. The signs and symptoms of potential                            delayed complications were discussed with the                            patient. Return to normal activities tomorrow.                            Written discharge instructions were provided to the                            patient.                           - Resume previous diet.                           - Continue present medications.                           - Await pathology results.                           - Please schedule appointment with Dr. Rhea Belton for                            hemorrhoid banding. Provide patient with brochure                            regarding banding procedure. Wilhemina Bonito. Marina Goodell, MD 05/20/2023 3:46:22 PM This report has been signed electronically.

## 2023-05-20 NOTE — Progress Notes (Signed)
Sedate, gd SR, tolerated procedure well, VSS, report to RN 

## 2023-05-20 NOTE — Progress Notes (Signed)
Called to room to assist during endoscopic procedure.  Patient ID and intended procedure confirmed with present staff. Received instructions for my participation in the procedure from the performing physician.  
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
## 2023-05-20 NOTE — Patient Instructions (Signed)
-   Resume previous diet. - Continue present medications. - Await pathology results. - Please schedule appointment with Dr. Rhea Belton for hemorrhoid banding. Provide patient with brochure regarding banding procedure.  YOU HAD AN ENDOSCOPIC PROCEDURE TODAY AT THE [facility]:   Refer to the procedure report that was given to you for any specific questions about what was found during the examination.  If the procedure report does not answer your questions, please call your gastroenterologist to clarify.  If you requested that your care partner not be given the details of your procedure findings, then the procedure report has been included in a sealed envelope for you to review at your convenience later.  YOU SHOULD EXPECT: Some feelings of bloating in the abdomen. Passage of more gas than usual.  Walking can help get rid of the air that was put into your GI tract during the procedure and reduce the bloating. If you had a lower endoscopy (such as a colonoscopy or flexible sigmoidoscopy) you may notice spotting of blood in your stool or on the toilet paper. If you underwent a bowel prep for your procedure, you may not have a normal bowel movement for a few days.  Please Note:  You might notice some irritation and congestion in your nose or some drainage.  This is from the oxygen used during your procedure.  There is no need for concern and it should clear up in a day or so.  SYMPTOMS TO REPORT IMMEDIATELY:  Following lower endoscopy (colonoscopy or flexible sigmoidoscopy):  Excessive amounts of blood in the stool  Significant tenderness or worsening of abdominal pains  Swelling of the abdomen that is new, acute  Fever of 100F or higher  For urgent or emergent issues, a gastroenterologist can be reached at any hour by calling [phone]. Do not use MyChart messaging for urgent concerns.    DIET:  We do recommend a small meal at first, but then you may proceed to your regular diet.   Drink plenty of fluids but you should avoid alcoholic beverages for 24 hours.  ACTIVITY:  You should plan to take it easy for the rest of today and you should NOT DRIVE or use heavy machinery until tomorrow (because of the sedation medicines used during the test).    FOLLOW UP: Our staff will call the number listed on your records the next business day following your procedure.  We will call around 7:15- 8:00 am to check on you and address any questions or concerns that you may have regarding the information given to you following your procedure. If we do not reach you, we will leave a message.     If any biopsies were taken you will be contacted by phone or by letter within the next 1-3 weeks.  Please call us at [phone] if you have not heard about the biopsies in 3 weeks.    SIGNATURES/CONFIDENTIALITY: You and/or your care partner have signed paperwork which will be entered into your electronic medical record.  These signatures attest to the fact that that the information above on your After Visit Summary has been reviewed and is understood.  Full responsibility of the confidentiality of this discharge information lies with you and/or your care-partner.

## 2023-05-20 NOTE — Progress Notes (Signed)
VS completed by DT.  Pt's states no medical or surgical changes since previsit or office visit.  

## 2023-05-21 ENCOUNTER — Telehealth: Payer: Self-pay | Admitting: *Deleted

## 2023-05-21 NOTE — Telephone Encounter (Signed)
Post procedure follow up call placed, no answer and left VM.  

## 2023-05-24 ENCOUNTER — Other Ambulatory Visit: Payer: Self-pay | Admitting: Neurology

## 2023-05-25 ENCOUNTER — Encounter: Payer: Self-pay | Admitting: Internal Medicine

## 2023-05-26 ENCOUNTER — Telehealth: Payer: Self-pay | Admitting: Adult Health

## 2023-05-26 NOTE — Telephone Encounter (Addendum)
Error

## 2023-05-26 NOTE — Telephone Encounter (Signed)
Requested Prescriptions   Pending Prescriptions Disp Refills   clonazePAM (KLONOPIN) 0.5 MG tablet [Pharmacy Med Name: CLONAZEPAM 0.5 MG TABLET] 120 tablet 2    Sig: TAKE 2 TABLETS (1 MG TOTAL) BY MOUTH 2 (TWO) TIMES DAILY.   Last seen 06/30/22, next appt scheduled for 06/29/23 Dispenses   Dispensed Days Supply Quantity Provider Pharmacy  CLONAZEPAM 0.5 MG TABLET 04/28/2023 30 120 each Dohmeier, Porfirio Mylar, MD [facility] [phone] - A...  CLONAZEPAM 0.5 MG TABLET 03/30/2023 30 120 each Dohmeier, Porfirio Mylar, MD [facility] [phone] - A...  CLONAZEPAM 0.5 MG TABLET 03/03/2023 30 120 each Dohmeier, Porfirio Mylar, MD [facility] [phone] - A...  CLONAZEPAM 0.5 MG TABLET 02/01/2023 30 120 each Micki Riley, MD [facility] [phone] - A...  CLONAZEPAM 0.5 MG TABLET 01/04/2023 30 120 each Micki Riley, MD [facility] [phone] - A...  CLONAZEPAM 0.5 MG TABLET 12/05/2022 30 120 each Micki Riley, MD [facility] [phone] - A...  CLONAZEPAM 0.5 MG TABLET 11/08/2022 30 120 each Dohmeier, Porfirio Mylar, MD [facility] [phone] - A...  CLONAZEPAM 0.5 MG TABLET 10/10/2022 30 120 each Dohmeier, Porfirio Mylar, MD [facility] [phone] - A...  CLONAZEPAM 0.5 MG TABLET 09/11/2022 30 120 each Dohmeier, Porfirio Mylar, MD [facility] [phone] - A...  CLONAZEPAM 0.5 MG TABLET 08/15/2022 30 120 each Dohmeier, Porfirio Mylar, MD [facility] [phone] - A...  CLONAZEPAM 0.5 MG TABLET 07/19/2022 30 120 each Dohmeier, Porfirio Mylar, MD [facility] [phone] - A...  CLONAZEPAM 0.5 MG TABLET 06/21/2022 30 120 each Dohmeier, Porfirio Mylar, MD [facility] [phone] - A.Marland KitchenMarland Kitchen

## 2023-06-03 ENCOUNTER — Encounter (HOSPITAL_BASED_OUTPATIENT_CLINIC_OR_DEPARTMENT_OTHER): Payer: Self-pay | Admitting: Family Medicine

## 2023-06-03 ENCOUNTER — Ambulatory Visit (INDEPENDENT_AMBULATORY_CARE_PROVIDER_SITE_OTHER): Payer: Medicare Other | Admitting: Family Medicine

## 2023-06-03 VITALS — BP 125/80 | HR 71 | Ht 75.0 in | Wt 157.9 lb

## 2023-06-03 DIAGNOSIS — E782 Mixed hyperlipidemia: Secondary | ICD-10-CM

## 2023-06-03 DIAGNOSIS — I10 Essential (primary) hypertension: Secondary | ICD-10-CM

## 2023-06-03 NOTE — Assessment & Plan Note (Signed)
Patient continues with atorvastatin, no issues reported, no reported myalgias. Recommend continuing with current dose at this time.

## 2023-06-03 NOTE — Progress Notes (Signed)
    Procedures performed today:    None.  Independent interpretation of notes and tests performed by another provider:   None.  Brief History, Exam, Impression, and Recommendations:    BP 125/80 (BP Location: Right Arm, Patient Position: Sitting, Cuff Size: Normal)   Pulse 71   Ht 6\' 3"  (1.905 m)   Wt 157 lb 14.4 oz (71.6 kg)   SpO2 100%   BMI 19.74 kg/m   Essential hypertension Assessment & Plan: Blood pressure controlled in office today.  No current issues related to chest pain, headaches, lightheadedness or dizziness.  Patient continues with carvedilol as well as Entresto.  No concerns reported today related to these medications No changes to be made to current medication regimen.  Can continue with regular follow-up with cardiology.  Recommend intermittent monitoring of blood pressure at home, DASH diet. Had EVAR with vascular surgeon, doing well   Mixed hyperlipidemia Assessment & Plan: Patient continues with atorvastatin, no issues reported, no reported myalgias. Recommend continuing with current dose at this time.   Return in about 6 months (around 12/03/2023) for hypertension, hyperlipidemia.   ___________________________________________ Stephen Siebels de [place], MD, ABFM, [facility]

## 2023-06-03 NOTE — Patient Instructions (Signed)
  Medication Instructions:  Your physician recommends that you continue on your current medications as directed. Please refer to the Current Medication list given to you today. --If you need a refill on any your medications before your next appointment, please call your pharmacy first. If no refills are authorized on file call the office.-- Lab Work: Your physician has recommended that you have lab work today: No If you have labs (blood work) drawn today and your tests are completely normal, you will receive your results via MyChart message OR a phone call from our staff.  Please ensure you check your voicemail in the event that you authorized detailed messages to be left on a delegated number. If you have any lab test that is abnormal or we need to change your treatment, we will call you to review the results.  Referrals/Procedures/Imaging: No  Follow-Up: Your next appointment:   Your physician recommends that you schedule a follow-up appointment in 6 months with Dr. de Cuba.  You will receive a text message or e-mail with a link to a survey about your care and experience with us today! We would greatly appreciate your feedback!   Thanks for letting us be apart of your health journey!!  Primary Care and Sports Medicine   Dr. Raymond de Cuba   We encourage you to activate your patient portal called "MyChart".  Sign up information is provided on this After Visit Summary.  MyChart is used to connect with patients for Virtual Visits (Telemedicine).  Patients are able to view lab/test results, encounter notes, upcoming appointments, etc.  Non-urgent messages can be sent to your provider as well. To learn more about what you can do with MyChart, please visit --  [email or web address].    

## 2023-06-03 NOTE — Assessment & Plan Note (Addendum)
Blood pressure controlled in office today.  No current issues related to chest pain, headaches, lightheadedness or dizziness.  Patient continues with carvedilol as well as Entresto.  No concerns reported today related to these medications No changes to be made to current medication regimen.  Can continue with regular follow-up with cardiology.  Recommend intermittent monitoring of blood pressure at home, DASH diet. Had EVAR with vascular surgeon, doing well

## 2023-06-14 DIAGNOSIS — R972 Elevated prostate specific antigen [PSA]: Secondary | ICD-10-CM | POA: Diagnosis not present

## 2023-06-21 DIAGNOSIS — N401 Enlarged prostate with lower urinary tract symptoms: Secondary | ICD-10-CM | POA: Diagnosis not present

## 2023-06-21 DIAGNOSIS — R351 Nocturia: Secondary | ICD-10-CM | POA: Diagnosis not present

## 2023-06-21 DIAGNOSIS — R972 Elevated prostate specific antigen [PSA]: Secondary | ICD-10-CM | POA: Diagnosis not present

## 2023-06-28 ENCOUNTER — Encounter: Payer: Self-pay | Admitting: *Deleted

## 2023-06-29 ENCOUNTER — Telehealth: Payer: Federal, State, Local not specified - PPO | Admitting: Adult Health

## 2023-07-01 ENCOUNTER — Encounter: Payer: Self-pay | Admitting: Adult Health

## 2023-07-01 ENCOUNTER — Ambulatory Visit (INDEPENDENT_AMBULATORY_CARE_PROVIDER_SITE_OTHER): Payer: Medicare Other | Admitting: Adult Health

## 2023-07-01 VITALS — BP 97/63 | HR 74 | Ht 75.0 in | Wt 158.0 lb

## 2023-07-01 DIAGNOSIS — G4700 Insomnia, unspecified: Secondary | ICD-10-CM | POA: Diagnosis not present

## 2023-07-01 DIAGNOSIS — G4733 Obstructive sleep apnea (adult) (pediatric): Secondary | ICD-10-CM

## 2023-07-01 DIAGNOSIS — G4730 Sleep apnea, unspecified: Secondary | ICD-10-CM

## 2023-07-01 MED ORDER — MIRTAZAPINE 45 MG PO TABS: 45.0000 mg | ORAL_TABLET | Freq: Every day | ORAL | 1 refills | Status: DC

## 2023-07-01 MED ORDER — GABAPENTIN 100 MG PO CAPS: ORAL_CAPSULE | ORAL | 3 refills | Status: DC

## 2023-07-01 MED ORDER — TIZANIDINE HCL 4 MG PO TABS: ORAL_TABLET | ORAL | 1 refills | Status: DC
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
## 2023-07-01 NOTE — Patient Instructions (Signed)
Your Plan:  Continue using CPAP nightly and greater than 4 hours each night If your symptoms worsen or you develop new symptoms please let us know.       Thank you for coming to see us at [facility]. I hope we have been able to provide you high quality care today.  You may receive a patient satisfaction survey over the next few weeks. We would appreciate your feedback and comments so that we may continue to improve ourselves and the health of our patients.  

## 2023-07-08 ENCOUNTER — Other Ambulatory Visit: Payer: Self-pay | Admitting: Cardiovascular Disease

## 2023-07-14 ENCOUNTER — Other Ambulatory Visit: Payer: Self-pay | Admitting: Cardiovascular Disease

## 2023-08-24 ENCOUNTER — Telehealth: Payer: Self-pay | Admitting: Internal Medicine

## 2023-08-24 NOTE — Telephone Encounter (Signed)
Pt is having hem banding tomorrow and wanted to know if he needed a driver. Discussed with him he does not need a driver as he will not be sedated. He wanted to know if he needed to wear special clothing, discussed with pt he does not need to wear anything special. Pt also wanted to know what pain meds he would be getting, discussed with him he should not have pain, if he does have pain he needs to let the doctor know and the band would be repositioned. Pt verbalized understanding.

## 2023-08-24 NOTE — Telephone Encounter (Signed)
Inbound call from patient requesting to speak with a nurse in regards to hemorrhoid banding he has on 9/11.   Advised patient he would not need a driver as well as he will not be put under anesthesia . Patient is wanting to be f/u by a nurse to further discuss procedure.   Please advise. Thank you

## 2023-08-25 ENCOUNTER — Encounter: Payer: Self-pay | Admitting: Internal Medicine

## 2023-08-25 ENCOUNTER — Ambulatory Visit (INDEPENDENT_AMBULATORY_CARE_PROVIDER_SITE_OTHER): Payer: Medicare Other | Admitting: Internal Medicine

## 2023-08-25 VITALS — BP 122/68 | HR 62 | Ht 75.0 in | Wt 150.0 lb

## 2023-08-25 DIAGNOSIS — K642 Third degree hemorrhoids: Secondary | ICD-10-CM | POA: Diagnosis not present

## 2023-08-25 NOTE — Patient Instructions (Signed)
HEMORRHOID BANDING PROCEDURE    FOLLOW-UP CARE   The procedure you have had should have been relatively painless since the banding of the area involved does not have nerve endings and there is no pain sensation.  The rubber band cuts off the blood supply to the hemorrhoid and the band may fall off as soon as 48 hours after the banding (the band may occasionally be seen in the toilet bowl following a bowel movement). You may notice a temporary feeling of fullness in the rectum which should respond adequately to plain Tylenol or Motrin.  Following the banding, avoid strenuous exercise that evening and resume full activity the next day.  A sitz bath (soaking in a warm tub) or bidet is soothing, and can be useful for cleansing the area after bowel movements.     To avoid constipation, take two tablespoons of natural wheat bran, natural oat bran, flax, Benefiber or any over the counter fiber supplement and increase your water intake to 7-8 glasses daily.    Unless you have been prescribed anorectal medication, do not put anything inside your rectum for two weeks: No suppositories, enemas, fingers, etc.  Occasionally, you may have more bleeding than usual after the banding procedure.  This is often from the untreated hemorrhoids rather than the treated one.  Don't be concerned if there is a tablespoon or so of blood.  If there is more blood than this, lie flat with your bottom higher than your head and apply an ice pack to the area. If the bleeding does not stop within a half an hour or if you feel faint, call our office at [phone] or go to the emergency room.  Problems are not common; however, if there is a substantial amount of bleeding, severe pain, chills, fever or difficulty passing urine (very rare) or other problems, you should call us at [phone] or report to the nearest emergency room.  Do not stay seated continuously for more than 2-3 hours for a day or two after the procedure.   Tighten your buttock muscles 10-15 times every two hours and take 10-15 deep breaths every 1-2 hours.  Do not spend more than a few minutes on the toilet if you cannot empty your bowel; instead re-visit the toilet at a later time.    

## 2023-08-25 NOTE — Progress Notes (Signed)
[name] is a 72 year old male patient of Dr. 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, history of adenomatous colon polyps, CAD with prior CABG, PCI on Plavix, prior aortic endovascular stent for AAA who is seen to discuss hemorrhoidal banding  No prior hemorrhoidal treatment other than periodic hydrocortisone suppository or cream Intermittently sees bloody bowel movement with straining Hemorrhoids prolapse and require manual reduction after bowel movement Positive intermittent fecal smearing and itching  He does use Plavix; he has not noticed an increase in hemorrhoidal bleeding since Plavix initiation   PROCEDURE NOTE:  The patient presents with symptomatic grade 3 internal hemorrhoids, requesting rubber band ligation of his hemorrhoidal disease.  All risks, benefits and alternative forms of therapy were described and informed consent was obtained.  We discussed the slightly higher risk for bleeding in the setting of Plavix.  The anorectum was pre-medicated with 0.125% nitroglycerin ointment The decision was made to band the RP internal hemorrhoid, and the [facility] System was used to perform band ligation without complication.   Digital anorectal examination was then performed to assure proper positioning of the band, and to adjust the banded tissue as required.  The patient was discharged home without pain or other issues.  Dietary and behavioral recommendations were given and along with follow-up instructions.     The patient will return as scheduled for follow-up and possible additional banding as required. No complications were encountered and the patient tolerated the procedure well.

## 2023-08-30 ENCOUNTER — Other Ambulatory Visit: Payer: Self-pay | Admitting: Neurology

## 2023-08-31 NOTE — Telephone Encounter (Signed)
Pt called checking on status of refill

## 2023-10-22 ENCOUNTER — Ambulatory Visit (INDEPENDENT_AMBULATORY_CARE_PROVIDER_SITE_OTHER): Payer: Medicare Other | Admitting: Internal Medicine

## 2023-10-22 ENCOUNTER — Encounter: Payer: Self-pay | Admitting: Internal Medicine

## 2023-10-22 VITALS — BP 100/60 | HR 57 | Ht 75.0 in | Wt 149.0 lb

## 2023-10-22 DIAGNOSIS — K642 Third degree hemorrhoids: Secondary | ICD-10-CM | POA: Diagnosis not present

## 2023-10-22 NOTE — Progress Notes (Signed)
[name] is a 72 year old male patient of Dr. Marina Goodell with longstanding symptomatic internal hemorrhoids here for banding visit #2  Symptoms prior to treatment Bleeding with bowel movement and straining Significant prolapse Fecal smearing and itching  He continues on Plavix.  No complications or bleeding after initial rubber band therapy  On 08/25/2023 the right posterior internal hemorrhoid column was banded  He reports significant improvement already in symptoms He wishes to continue the banding protocol   PROCEDURE NOTE:  The patient presents with symptomatic grade 3 internal hemorrhoids, requesting rubber band ligation of his hemorrhoidal disease.  All risks, benefits and alternative forms of therapy were described and informed consent was obtained.   The anorectum was pre-medicated with 0.125% nitroglycerin ointment The decision was made to band the RA (RP banded at visit #1) internal hemorrhoid, and the [facility] System was used to perform band ligation without complication.   Digital anorectal examination was then performed to assure proper positioning of the band, and to adjust the banded tissue as required.  The patient was discharged home without pain or other issues.  Dietary and behavioral recommendations were given and along with follow-up instructions.    The patient will return as scheduled for follow-up and possible additional banding as required. No complications were encountered and the patient tolerated the procedure well.

## 2023-10-22 NOTE — Patient Instructions (Signed)
HEMORRHOID BANDING PROCEDURE    FOLLOW-UP CARE   The procedure you have had should have been relatively painless since the banding of the area involved does not have nerve endings and there is no pain sensation.  The rubber band cuts off the blood supply to the hemorrhoid and the band may fall off as soon as 48 hours after the banding (the band may occasionally be seen in the toilet bowl following a bowel movement). You may notice a temporary feeling of fullness in the rectum which should respond adequately to plain Tylenol or Motrin.  Following the banding, avoid strenuous exercise that evening and resume full activity the next day.  A sitz bath (soaking in a warm tub) or bidet is soothing, and can be useful for cleansing the area after bowel movements.     To avoid constipation, take two tablespoons of natural wheat bran, natural oat bran, flax, Benefiber or any over the counter fiber supplement and increase your water intake to 7-8 glasses daily.    Unless you have been prescribed anorectal medication, do not put anything inside your rectum for two weeks: No suppositories, enemas, fingers, etc.  Occasionally, you may have more bleeding than usual after the banding procedure.  This is often from the untreated hemorrhoids rather than the treated one.  Don't be concerned if there is a tablespoon or so of blood.  If there is more blood than this, lie flat with your bottom higher than your head and apply an ice pack to the area. If the bleeding does not stop within a half an hour or if you feel faint, call our office at [phone] or go to the emergency room.  Problems are not common; however, if there is a substantial amount of bleeding, severe pain, chills, fever or difficulty passing urine (very rare) or other problems, you should call us at [phone] or report to the nearest emergency room.  Do not stay seated continuously for more than 2-3 hours for a day or two after the procedure.   Tighten your buttock muscles 10-15 times every two hours and take 10-15 deep breaths every 1-2 hours.  Do not spend more than a few minutes on the toilet if you cannot empty your bowel; instead re-visit the toilet at a later time.    

## 2023-10-27 ENCOUNTER — Other Ambulatory Visit: Payer: Self-pay | Admitting: Neurology

## 2023-10-27 NOTE — Telephone Encounter (Signed)
Pt called stating that his  clonazePAM (KLONOPIN) 0.5 MG tablet Rx has the wrong dosage. He states that he takes 4 .5mg  at bedtime and the Rx that the pharmacy has is half of that. Pt would like an updated Rx to be sent in to the [facility] in [place]

## 2023-10-27 NOTE — Telephone Encounter (Signed)
LVM  for pt to call back to discuss refil request

## 2023-10-28 ENCOUNTER — Other Ambulatory Visit: Payer: Self-pay | Admitting: Neurology

## 2023-10-28 MED ORDER — CLONAZEPAM 1 MG PO TABS: 1.0000 mg | ORAL_TABLET | Freq: Two times a day (BID) | ORAL | 1 refills | Status: DC | PRN

## 2023-10-28 NOTE — Addendum Note (Signed)
Addended by: Melvyn Novas on: 10/28/2023 04:48 PM   Modules accepted: Orders

## 2023-10-28 NOTE — Telephone Encounter (Signed)
Pt called back and is confirming that the Rx is to state 1mg  BID at night. Pt states he has been out for almost 2 weeks and is needing the refill as soon as possible. He would like to pick up today at the [facility] in [place].

## 2023-10-28 NOTE — Addendum Note (Signed)
Addended by: Judi Cong on: 10/28/2023 04:23 PM   Modules accepted: Orders

## 2023-10-29 ENCOUNTER — Other Ambulatory Visit: Payer: Self-pay | Admitting: Adult Health

## 2023-11-02 NOTE — Progress Notes (Unsigned)
Cardiology Office Note:    Date:  11/03/2023  ID:  [name], DOB November 01, 1951, MRN [id number] PCP: de [place], Raymond J, MD  [facility] Providers Cardiologist:  Tonny Bollman, MD Cardiology APP:  Beatrice Lecher, PA-C       Patient Profile:      Coronary artery disease  S/p CABG in 2002 Cardiac catheterization in 2012: 3/5 grafts patent Myoview 12/21: Large inferior and medium anterior scar, minimal peri-infarct ischemia, EF 33; high risk S/p 3.5 x 15 mm DES to LAD 12/2020; L-LAD atretic, S-RCA ok/PDA limb 100; S-OM 100, S-RI ok Myoview 06/05/22: inf infarct, dist ant infarct, no ischemia, EF 41 HFmrEF (heart failure with mildly reduced ejection fraction)   TTE 11/14/20: Septal and anterior HK consistent with prior LAD infarct, EF 40-45, GR 1 DD, normal RVSF, trivial MR, mild dilation of ascending aorta measuring 40 mm  TTE 05/20/21: EF 45-50 TTE 11/30/22: EF 55, no RWMA, NL RVSF, mild to mod LAE, mild MR< trivial AI, AV sclerosis, Ao root 41 mm, Asc Ao 40 mm Abdominal aortic aneurysm  S/p EVAR 01/2023 (Dr. Sherral Hammers) Mildly dilated ascending aorta OSA  Hx of syncope in 2019 EF normal on echocardiogram // Event monitor 10/19: no sig arrhythmias  Hypertension  Hyperlipidemia  Peripheral arterial disease  AAA [place] in 2019 w >50% L CIA stenosis; ABIs 6/19: normal          History of Present Illness:  Discussed the use of AI scribe software for clinical note transcription with the patient, who gave verbal consent to proceed.  [name] is a 72 y.o. male who returns for follow up of CAD, CHF. He was last seen by Dr. Excell Seltzer in 04/2023. He is here alone. He is doing well without chest pain, pressure, tightness, or shortness of breath. He has not had syncope, and no swelling in the legs was reported. The patient also reported unintentional weight loss of about 25 pounds since February, around the time of the aneurysm surgery. Despite efforts to regain the weight, the patient has  been unsuccessful. Unfortunately, his wife is not doing well. She had to have a bone growth stimulator implanted to help cervical spine fractures to heal.      Review of Systems  Gastrointestinal:  Positive for hemorrhoids. Negative for hematochezia.  Genitourinary:  Negative for hematuria.  See HPI     Studies Reviewed:   EKG Interpretation Date/Time:  Wednesday November 03 2023 11:27:41 EST Ventricular Rate:  72 PR Interval:  198 QRS Duration:  88 QT Interval:  376 QTC Calculation: 411 R Axis:   29  Text Interpretation: Normal sinus rhythm Normal ECG No significant change since last tracing Confirmed by Tereso Newcomer [phone]) on 11/03/2023 11:40:19 AM    Results-chart review 05/03/2023: HDL 47, LDL 66, triglycerides 87, ALT 19           Risk Assessment/Calculations:             Physical Exam:   VS:  BP 120/80   Pulse 72   Ht 6\' 3"  (1.905 m)   Wt 150 lb 12.8 oz (68.4 kg)   SpO2 98%   BMI 18.85 kg/m    Wt Readings from Last 3 Encounters:  11/03/23 150 lb 12.8 oz (68.4 kg)  10/22/23 149 lb (67.6 kg)  08/25/23 150 lb (68 kg)    Constitutional:      Appearance: Healthy appearance. Not in distress.  Neck:     Vascular: No  JVR. JVD normal.  Pulmonary:     Breath sounds: Normal breath sounds. No wheezing. No rales.  Cardiovascular:     Normal rate. Regular rhythm.     Murmurs: There is no murmur.  Edema:    Peripheral edema absent.  Abdominal:     Palpations: Abdomen is soft.        Assessment and Plan:   Assessment & Plan Coronary artery disease involving native coronary artery of native heart without angina pectoris Status post CABG in 2002 and DES to the LAD in January 2022. At the time of his cath in 2022, there was LIMA-LAD atretic, patent vein graft to RCA, occluded vein grafts to PDA and OM, patent vein graft to ramus intermediate. Nuclear stress test in June 2023 showed inferior and anterior infarct without ischemia. Currently asymptomatic without chest  pain to suggest angina.  - Continue aspirin 81 mg daily - Continue Plavix 85 mg daily - Continue Lipitor 40 mg daily - Continue Coreg 3.125 mg twice daily - Continue nitroglycerin as needed 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) Echocardiogram in December 2023 showed improved ejection fraction at 55%. NYHA Class II, volume status stable. - Continue Entresto 49/51 mg twice daily - Continue Coreg 3.125 mg twice daily Ascending aorta dilation (HCC) 40 mm by echocardiogram December 2023.  Repeat echo pending December 2024. Essential hypertension Blood pressure well-controlled. - Continue carvedilol 3.125 mg twice daily - Continue Entresto 49/51 mg twice daily Mixed hyperlipidemia Optimal LDL levels earlier this year. - Continue Lipitor 40 mg daily Abdominal aortic aneurysm (AAA) without rupture, unspecified part (HCC) Status post EVAR in February 2024.  - Continue follow-up with vascular surgery, Dr. Sherral Hammers - Maintain good blood pressure control Weight loss Weight loss noted around aneurysm surgery in February 2024. Weight stabilized but difficulty increasing. Discussed high-calorie intake while avoiding processed foods and saturated fats. - Follow-up with primary care if further weight loss occurs        Dispo:  Return in about 6 months (around 05/02/2024) for Routine Follow Up, w/ Dr. Excell Seltzer.  Signed, Tereso Newcomer, PA-C

## 2023-11-03 ENCOUNTER — Encounter: Payer: Self-pay | Admitting: Physician Assistant

## 2023-11-03 ENCOUNTER — Ambulatory Visit: Payer: Medicare Other | Attending: Physician Assistant | Admitting: Physician Assistant

## 2023-11-03 VITALS — BP 120/80 | HR 72 | Ht 75.0 in | Wt 150.8 lb

## 2023-11-03 DIAGNOSIS — R634 Abnormal weight loss: Secondary | ICD-10-CM | POA: Diagnosis not present

## 2023-11-03 DIAGNOSIS — I10 Essential (primary) hypertension: Secondary | ICD-10-CM | POA: Insufficient documentation

## 2023-11-03 DIAGNOSIS — I2510 Atherosclerotic heart disease of native coronary artery without angina pectoris: Secondary | ICD-10-CM | POA: Diagnosis not present

## 2023-11-03 DIAGNOSIS — E782 Mixed hyperlipidemia: Secondary | ICD-10-CM | POA: Diagnosis not present

## 2023-11-03 DIAGNOSIS — I77810 Thoracic aortic ectasia: Secondary | ICD-10-CM | POA: Insufficient documentation

## 2023-11-03 DIAGNOSIS — I5020 Unspecified systolic (congestive) heart failure: Secondary | ICD-10-CM | POA: Insufficient documentation

## 2023-11-03 DIAGNOSIS — I714 Abdominal aortic aneurysm, without rupture, unspecified: Secondary | ICD-10-CM | POA: Insufficient documentation

## 2023-11-03 NOTE — Patient Instructions (Signed)
Medication Instructions:  Your physician recommends that you continue on your current medications as directed. Please refer to the Current Medication list given to you today.  *If you need a refill on your cardiac medications before your next appointment, please call your pharmacy*  Lab Work: None  Testing/Procedures: None  Follow-Up: At [facility], you and your health needs are our priority.  As part of our continuing mission to provide you with exceptional heart care, we have created designated Provider Care Teams.  These Care Teams include your primary Cardiologist (physician) and Advanced Practice Providers (APPs -  Physician Assistants and Nurse Practitioners) who all work together to provide you with the care you need, when you need it.   Your next appointment:   6 month(s)  Provider:   Tonny Bollman, MD

## 2023-11-03 NOTE — Assessment & Plan Note (Signed)
Weight loss noted around aneurysm surgery in February 2024. Weight stabilized but difficulty increasing. Discussed high-calorie intake while avoiding processed foods and saturated fats. - Follow-up with primary care if further weight loss occurs

## 2023-11-03 NOTE — Assessment & Plan Note (Signed)
Echocardiogram in December 2023 showed improved ejection fraction at 55%. NYHA Class II, volume status stable. - Continue Entresto 49/51 mg twice daily - Continue Coreg 3.125 mg twice daily

## 2023-11-03 NOTE — Assessment & Plan Note (Signed)
40 mm by echocardiogram December 2023.  Repeat echo pending December 2024.

## 2023-11-03 NOTE — Assessment & Plan Note (Signed)
Optimal LDL levels earlier this year. - Continue Lipitor 40 mg daily

## 2023-11-03 NOTE — Assessment & Plan Note (Signed)
Blood pressure well-controlled. - Continue carvedilol 3.125 mg twice daily - Continue Entresto 49/51 mg twice daily

## 2023-11-03 NOTE — Assessment & Plan Note (Signed)
Status post CABG in 2002 and DES to the LAD in January 2022. At the time of his cath in 2022, there was LIMA-LAD atretic, patent vein graft to RCA, occluded vein grafts to PDA and OM, patent vein graft to ramus intermediate. Nuclear stress test in June 2023 showed inferior and anterior infarct without ischemia. Currently asymptomatic without chest pain to suggest angina.  - Continue aspirin 81 mg daily - Continue Plavix 85 mg daily - Continue Lipitor 40 mg daily - Continue Coreg 3.125 mg twice daily - Continue nitroglycerin as needed

## 2023-11-03 NOTE — Assessment & Plan Note (Signed)
Status post EVAR in February 2024.  - Continue follow-up with vascular surgery, Dr. Sherral Hammers - Maintain good blood pressure control

## 2023-11-19 ENCOUNTER — Ambulatory Visit (HOSPITAL_COMMUNITY): Payer: Medicare Other | Attending: Physician Assistant

## 2023-11-19 DIAGNOSIS — I77810 Thoracic aortic ectasia: Secondary | ICD-10-CM | POA: Diagnosis not present

## 2023-11-19 DIAGNOSIS — I5020 Unspecified systolic (congestive) heart failure: Secondary | ICD-10-CM | POA: Diagnosis not present

## 2023-11-19 LAB — ECHOCARDIOGRAM COMPLETE
Area-P 1/2: 1.87 cm2
S' Lateral: 2.9 cm

## 2023-11-22 ENCOUNTER — Telehealth: Payer: Self-pay | Admitting: *Deleted

## 2023-11-22 DIAGNOSIS — I5020 Unspecified systolic (congestive) heart failure: Secondary | ICD-10-CM

## 2023-11-22 DIAGNOSIS — I77810 Thoracic aortic ectasia: Secondary | ICD-10-CM

## 2023-11-22 NOTE — Telephone Encounter (Signed)
-----   Message from [name] sent at 11/21/2023 10:37 PM EST ----- Results sent to [name] via MyChart. See MyChart comments below. I will send a copy to PCP as FYI. PLAN:  -Repeat echocardiogram in 1 year (Dx dilated ascending aorta)  [name]  Your echocardiogram shows stable heart function (ejection fraction). The 3D ejection fraction is a more accurate assessment of ejection fraction and on this study, it was 54%. Your last echocardiogram in 11/2022 was estimated at 55%. Your ascending aorta (large artery) is mildly enlarged and stable since the last study in 11/2022.  Continue current medications/treatment plan and follow up as scheduled. We will repeat your echocardiogram again in 1 year.  Stephen Newcomer, PA-C

## 2023-11-28 ENCOUNTER — Other Ambulatory Visit (HOSPITAL_BASED_OUTPATIENT_CLINIC_OR_DEPARTMENT_OTHER): Payer: Self-pay | Admitting: Family Medicine
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
## 2023-12-23 ENCOUNTER — Ambulatory Visit (INDEPENDENT_AMBULATORY_CARE_PROVIDER_SITE_OTHER): Payer: Medicare Other | Admitting: Vascular Surgery

## 2023-12-23 ENCOUNTER — Encounter: Payer: Self-pay | Admitting: Vascular Surgery

## 2023-12-23 ENCOUNTER — Ambulatory Visit (HOSPITAL_COMMUNITY)
Admission: RE | Admit: 2023-12-23 | Discharge: 2023-12-23 | Disposition: A | Discharge status: Home / Self Care | Payer: Medicare Other | Source: Ambulatory Visit | Attending: Vascular Surgery | Admitting: Vascular Surgery

## 2023-12-23 VITALS — BP 126/68 | HR 66 | Temp 98.3°F | Resp 20 | Ht 75.0 in | Wt 152.0 lb

## 2023-12-23 DIAGNOSIS — Z9889 Other specified postprocedural states: Secondary | ICD-10-CM | POA: Diagnosis not present

## 2023-12-23 DIAGNOSIS — Z8679 Personal history of other diseases of the circulatory system: Secondary | ICD-10-CM

## 2023-12-27 ENCOUNTER — Ambulatory Visit: Payer: Medicare Other | Admitting: Internal Medicine

## 2023-12-27 ENCOUNTER — Encounter: Payer: Self-pay | Admitting: Internal Medicine

## 2023-12-27 VITALS — BP 124/80 | HR 80 | Ht 73.25 in | Wt 153.5 lb

## 2023-12-27 DIAGNOSIS — K642 Third degree hemorrhoids: Secondary | ICD-10-CM

## 2023-12-27 NOTE — Progress Notes (Signed)
 [name] is a 73 year old male patient of Dr. Abran with longstanding symptomatic internal hemorrhoids here for banding visit #3  Symptoms prior to treatment Bleeding with bowel movement and straining Significant prolapse Fecal smearing and itching  He continues on Plavix .  No complications with initial banding visits Some very minor prolapse persistent only 1 episode of minor bleeding since second banding  He is very happy with response to date   PROCEDURE NOTE:  The patient presents with symptomatic grade 3 internal hemorrhoids, requesting rubber band ligation of his hemorrhoidal disease.  All risks, benefits and alternative forms of therapy were described and informed consent was obtained.   The anorectum was pre-medicated with 0.125% nitroglycerin  ointment The decision was made to band the LL (RA and RP banded previously) internal hemorrhoid, and the [facility] System was used to perform band ligation without complication.   Digital anorectal examination was then performed to assure proper positioning of the band, and to adjust the banded tissue as required.  The patient was discharged home without pain or other issues.  Dietary and behavioral recommendations were given and along with follow-up instructions.    The patient will return as needed scheduled for follow-up with Dr. Abran.  Should additional banding be required in the future I am happy to assist.    No complications were encountered and the patient tolerated the procedure well.

## 2023-12-27 NOTE — Patient Instructions (Signed)

## 2023-12-29 ENCOUNTER — Telehealth: Payer: Self-pay | Admitting: Cardiovascular Disease

## 2023-12-29 NOTE — Telephone Encounter (Signed)
 Pt c/o medication issue:  1. Name of Medication: Entresto   2. How are you currently taking this medication (dosage and times per day)?   3. Are you having a reaction (difficulty breathing--STAT)?   4. What is your medication issue? Need something else to replace Entresto , insurance will no longer pay for Entresto . He said please call something in asap today- he is out of his Entresto 

## 2023-12-30 ENCOUNTER — Telehealth: Payer: Self-pay | Admitting: Pharmacy Technician

## 2023-12-30 ENCOUNTER — Other Ambulatory Visit: Payer: Self-pay

## 2023-12-30 ENCOUNTER — Other Ambulatory Visit (HOSPITAL_COMMUNITY): Payer: Self-pay

## 2023-12-30 NOTE — Telephone Encounter (Signed)
Pharmacy Patient Advocate Encounter   Received notification from Pt Calls Messages that prior authorization for entresto is required/requested.   Insurance verification completed.   The patient is insured through [employer] .   Per test claim: The current 12/30/23 day co-pay is, $45.00 one month .  No PA needed at this time. This test claim was processed through [facility]- copay amounts may vary at other pharmacies due to pharmacy/plan contracts, or as the patient moves through the different stages of their insurance plan.

## 2023-12-30 NOTE — Telephone Encounter (Signed)
  THIS IS THE COVERAGE "BCBS" THAT WAS SCANNED IN ON 12/23/23     THIS IS THE ONE ELIGIBILITY CAME UP WITH AS BIN [id number]. GOOGLE SAYS THIS IS [employer]ICARE BUT THAT COB AS THE PCN MAKES ME THINK ITS COBRA. IM ON THE PHONE W [employer] -THEY ACTING LIKE HE HAS COVERAGE WITH THEM BC SHE SAID SHE TRANSFERRING ME TO A DEDICATED TEAM.. AFTER TALKING TO REP SHE SAID HE ONLY HAS SILVERSCRIPT. ALL OVER COVERAGES ARE TERMED.   THIS IS THE ONE ELIGIBILITY CAME UP WITH AS BIN [id number] WHICH WAS NOT IN [place] SO I ADDED IT BUT IT SAYS NOT CONTRACTED WITH [place]

## 2024-01-04 ENCOUNTER — Other Ambulatory Visit: Payer: Self-pay

## 2024-01-04 DIAGNOSIS — Z9889 Other specified postprocedural states: Secondary | ICD-10-CM

## 2024-01-13 ENCOUNTER — Encounter: Payer: Self-pay | Admitting: Adult Health

## 2024-01-13 ENCOUNTER — Other Ambulatory Visit: Payer: Self-pay | Admitting: Adult Health

## 2024-01-13 ENCOUNTER — Encounter: Payer: Self-pay | Admitting: Cardiovascular Disease

## 2024-01-19 ENCOUNTER — Other Ambulatory Visit: Payer: Self-pay | Admitting: Cardiovascular Disease

## 2024-01-20 MED ORDER — LOSARTAN POTASSIUM 50 MG PO TABS: 50.0000 mg | ORAL_TABLET | Freq: Every day | ORAL | 3 refills | Status: DC

## 2024-01-21 ENCOUNTER — Other Ambulatory Visit: Payer: Self-pay | Admitting: Adult Health

## 2024-03-07 ENCOUNTER — Ambulatory Visit (HOSPITAL_BASED_OUTPATIENT_CLINIC_OR_DEPARTMENT_OTHER): Payer: Medicare Other | Admitting: *Deleted

## 2024-03-07 ENCOUNTER — Encounter (HOSPITAL_BASED_OUTPATIENT_CLINIC_OR_DEPARTMENT_OTHER): Payer: Self-pay

## 2024-03-07 DIAGNOSIS — Z0000 Encounter for general adult medical examination without abnormal findings: Secondary | ICD-10-CM | POA: Diagnosis not present
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
## 2024-03-07 NOTE — Patient Instructions (Signed)
 [name] , Thank you for taking time to come for your Medicare Wellness Visit. I appreciate your ongoing commitment to your health goals. Please review the following plan we discussed and let me know if I can assist you in the future.   Screening recommendations/referrals: Colonoscopy: Education provided Recommended yearly ophthalmology/optometry visit for glaucoma screening and checkup Recommended yearly dental visit for hygiene and checkup  Vaccinations: Influenza vaccine: Education provided Pneumococcal vaccine: up to date Tdap vaccine: Education provided Shingles vaccine: up to date    Advanced directives: Education provided   Preventive Care 65 Years and Older, Male Preventive care refers to lifestyle choices and visits with your health care provider that can promote health and wellness. What does preventive care include? A yearly physical exam. This is also called an annual well check. Dental exams once or twice a year. Routine eye exams. Ask your health care provider how often you should have your eyes checked. Personal lifestyle choices, including: Daily care of your teeth and gums. Regular physical activity. Eating a healthy diet. Avoiding tobacco and drug use. Limiting alcohol use. Practicing safe sex. Taking low doses of aspirin every day. Taking vitamin and mineral supplements as recommended by your health care provider. What happens during an annual well check? The services and screenings done by your health care provider during your annual well check will depend on your age, overall health, lifestyle risk factors, and family history of disease. Counseling  Your health care provider may ask you questions about your: Alcohol use. Tobacco use. Drug use. Emotional well-being. Home and relationship well-being. Sexual activity. Eating habits. History of falls. Memory and ability to understand (cognition). Work and work Astronomer. Screening  You may have the  following tests or measurements: Height, weight, and BMI. Blood pressure. Lipid and cholesterol levels. These may be checked every 5 years, or more frequently if you are over 70 years old. Skin check. Lung cancer screening. You may have this screening every year starting at age 69 if you have a 30-pack-year history of smoking and currently smoke or have quit within the past 15 years. Fecal occult blood test (FOBT) of the stool. You may have this test every year starting at age 51. Flexible sigmoidoscopy or colonoscopy. You may have a sigmoidoscopy every 5 years or a colonoscopy every 10 years starting at age 78. Prostate cancer screening. Recommendations will vary depending on your family history and other risks. Hepatitis C blood test. Hepatitis B blood test. Sexually transmitted disease (STD) testing. Diabetes screening. This is done by checking your blood sugar (glucose) after you have not eaten for a while (fasting). You may have this done every 1-3 years. Abdominal aortic aneurysm (AAA) screening. You may need this if you are a current or former smoker. Osteoporosis. You may be screened starting at age 70 if you are at high risk. Talk with your health care provider about your test results, treatment options, and if necessary, the need for more tests. Vaccines  Your health care provider may recommend certain vaccines, such as: Influenza vaccine. This is recommended every year. Tetanus, diphtheria, and acellular pertussis (Tdap, Td) vaccine. You may need a Td booster every 10 years. Zoster vaccine. You may need this after age 66. Pneumococcal 13-valent conjugate (PCV13) vaccine. One dose is recommended after age 41. Pneumococcal polysaccharide (PPSV23) vaccine. One dose is recommended after age 15. Talk to your health care provider about which screenings and vaccines you need and how often you need them. This information is not  intended to replace advice given to you by your health care  provider. Make sure you discuss any questions you have with your health care provider. Document Released: 12/27/2015 Document Revised: 08/19/2016 Document Reviewed: 10/01/2015 Elsevier Interactive Patient Education  2017 [employer].  Fall Prevention in the Home Falls can cause injuries. They can happen to people of all ages. There are many things you can do to make your home safe and to help prevent falls. What can I do on the outside of my home? Regularly fix the edges of walkways and driveways and fix any cracks. Remove anything that might make you trip as you walk through a door, such as a raised step or threshold. Trim any bushes or trees on the path to your home. Use bright outdoor lighting. Clear any walking paths of anything that might make someone trip, such as rocks or tools. Regularly check to see if handrails are loose or broken. Make sure that both sides of any steps have handrails. Any raised decks and porches should have guardrails on the edges. Have any leaves, snow, or ice cleared regularly. Use sand or salt on walking paths during winter. Clean up any spills in your garage right away. This includes oil or grease spills. What can I do in the bathroom? Use night lights. Install grab bars by the toilet and in the tub and shower. Do not use towel bars as grab bars. Use non-skid mats or decals in the tub or shower. If you need to sit down in the shower, use a plastic, non-slip stool. Keep the floor dry. Clean up any water that spills on the floor as soon as it happens. Remove soap buildup in the tub or shower regularly. Attach bath mats securely with double-sided non-slip rug tape. Do not have throw rugs and other things on the floor that can make you trip. What can I do in the bedroom? Use night lights. Make sure that you have a light by your bed that is easy to reach. Do not use any sheets or blankets that are too big for your bed. They should not hang down onto the  floor. Have a firm chair that has side arms. You can use this for support while you get dressed. Do not have throw rugs and other things on the floor that can make you trip. What can I do in the kitchen? Clean up any spills right away. Avoid walking on wet floors. Keep items that you use a lot in easy-to-reach places. If you need to reach something above you, use a strong step stool that has a grab bar. Keep electrical cords out of the way. Do not use floor polish or wax that makes floors slippery. If you must use wax, use non-skid floor wax. Do not have throw rugs and other things on the floor that can make you trip. What can I do with my stairs? Do not leave any items on the stairs. Make sure that there are handrails on both sides of the stairs and use them. Fix handrails that are broken or loose. Make sure that handrails are as long as the stairways. Check any carpeting to make sure that it is firmly attached to the stairs. Fix any carpet that is loose or worn. Avoid having throw rugs at the top or bottom of the stairs. If you do have throw rugs, attach them to the floor with carpet tape. Make sure that you have a light switch at the top of the stairs  and the bottom of the stairs. If you do not have them, ask someone to add them for you. What else can I do to help prevent falls? Wear shoes that: Do not have high heels. Have rubber bottoms. Are comfortable and fit you well. Are closed at the toe. Do not wear sandals. If you use a stepladder: Make sure that it is fully opened. Do not climb a closed stepladder. Make sure that both sides of the stepladder are locked into place. Ask someone to hold it for you, if possible. Clearly mark and make sure that you can see: Any grab bars or handrails. First and last steps. Where the [place] of each step is. Use tools that help you move around (mobility aids) if they are needed. These include: Canes. Walkers. Scooters. Crutches. Turn on the  lights when you go into a dark area. Replace any light bulbs as soon as they burn out. Set up your furniture so you have a clear path. Avoid moving your furniture around. If any of your floors are uneven, fix them. If there are any pets around you, be aware of where they are. Review your medicines with your doctor. Some medicines can make you feel dizzy. This can increase your chance of falling. Ask your doctor what other things that you can do to help prevent falls. This information is not intended to replace advice given to you by your health care provider. Make sure you discuss any questions you have with your health care provider. Document Released: 09/26/2009 Document Revised: 05/07/2016 Document Reviewed: 01/04/2015 Elsevier Interactive Patient Education  2017 [employer].

## 2024-04-17 ENCOUNTER — Telehealth: Payer: Self-pay | Admitting: Internal Medicine

## 2024-04-17 NOTE — Telephone Encounter (Signed)
 Inbound call from patient, states he is having fecal incontinence and a prolapse, patient states he is having to wear adult diapers, and would like to discuss with a nurse a sooner appointment and also what to do to relieve symptoms.

## 2024-04-17 NOTE — Telephone Encounter (Signed)
 Left message for pt to call back.  Pt states he has been having fecal incontinence for about a month. Pt has not tried any fiber supplements. Pt schecduled to see Bayley McMichael PA 04/17/24@2 :10pm. Pt aware of appt.

## 2024-04-18 ENCOUNTER — Encounter: Payer: Self-pay | Admitting: Gastroenterology

## 2024-04-18 ENCOUNTER — Ambulatory Visit (INDEPENDENT_AMBULATORY_CARE_PROVIDER_SITE_OTHER): Admitting: Gastroenterology

## 2024-04-18 VITALS — BP 128/70 | HR 80 | Ht 72.0 in | Wt 162.1 lb

## 2024-04-18 DIAGNOSIS — K642 Third degree hemorrhoids: Secondary | ICD-10-CM

## 2024-04-18 DIAGNOSIS — I714 Abdominal aortic aneurysm, without rupture, unspecified: Secondary | ICD-10-CM

## 2024-04-18 DIAGNOSIS — R159 Full incontinence of feces: Secondary | ICD-10-CM | POA: Diagnosis not present

## 2024-04-18 DIAGNOSIS — I2510 Atherosclerotic heart disease of native coronary artery without angina pectoris: Secondary | ICD-10-CM

## 2024-04-18 DIAGNOSIS — R634 Abnormal weight loss: Secondary | ICD-10-CM

## 2024-04-18 DIAGNOSIS — K648 Other hemorrhoids: Secondary | ICD-10-CM

## 2024-04-18 DIAGNOSIS — Z87891 Personal history of nicotine dependence: Secondary | ICD-10-CM | POA: Diagnosis not present

## 2024-04-18 MED ORDER — HYDROCORTISONE (PERIANAL) 2.5 % EX CREA: 1.0000 | TOPICAL_CREAM | Freq: Two times a day (BID) | CUTANEOUS | 0 refills | Status: AC

## 2024-04-18 NOTE — Addendum Note (Signed)
 Addended by: Garr Kalata on: 04/18/2024 04:01 PM   Modules accepted: Level of Service
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
## 2024-04-18 NOTE — Patient Instructions (Addendum)
 A high fiber diet with plenty of fluids (up to 8 glasses of water daily) is suggested to relieve these symptoms. Benefiber, 1 tablespoon once or twice daily can be used to keep bowels regular if needed.   We have sent the following medications to your pharmacy for you to pick up at your convenience: Hydrocortisone  cream  _______________________________________________________  If your blood pressure at your visit was 140/90 or greater, please contact your primary care physician to follow up on this.  _______________________________________________________  If you are age 58 or older, your body mass index should be between 23-30. Your Body mass index is 21.99 kg/m. If this is out of the aforementioned range listed, please consider follow up with your Primary Care Provider.  If you are age 34 or younger, your body mass index should be between 19-25. Your Body mass index is 21.99 kg/m. If this is out of the aformentioned range listed, please consider follow up with your Primary Care Provider.   ________________________________________________________  The [facility] providers would like to encourage you to use MYCHART to communicate with providers for non-urgent requests or questions.  Due to long hold times on the telephone, sending your provider a message by [facility] may be a faster and more efficient way to get a response.  Please allow 48 business hours for a response.  Please remember that this is for non-urgent requests.  _______________________________________________________

## 2024-04-20 ENCOUNTER — Other Ambulatory Visit: Payer: Self-pay | Admitting: Neurology

## 2024-04-21 NOTE — Telephone Encounter (Signed)
 Requested Prescriptions   Pending Prescriptions Disp Refills   clonazePAM  (KLONOPIN ) 1 MG tablet [Pharmacy Med Name: CLONAZEPAM  1 MG TABLET] 180 tablet 1    Sig: TAKE 1 TABLET (1 MG TOTAL) BY MOUTH 2 (TWO) TIMES DAILY AS NEEDED FOR ANXIETY (TWICE AT BEDTIME).   Last seen 07/01/23, next appt 07/18/24  Dispenses   Dispensed Days Supply Quantity Provider Pharmacy  CLONAZEPAM  1 MG TABLET 01/24/2024 90 180 each Dohmeier, Raoul Byes, MD [facility] [phone] - A...  CLONAZEPAM  1 MG TABLET 10/28/2023 90 180 each Dohmeier, Raoul Byes, MD [facility] [phone] - A...  CLONAZEPAM  0.5 MG TABLET 09/30/2023 30 60 each Clem Currier, NP [facility] [phone] - A...  CLONAZEPAM  0.5 MG TABLET 09/03/2023 30 60 each Clem Currier, NP [facility] [phone] - A...  CLONAZEPAM  0.5 MG TABLET 07/29/2023 30 120 each Dohmeier, Raoul Byes, MD [facility] [phone] - A...  CLONAZEPAM  0.5 MG TABLET 06/27/2023 30 120 each Dohmeier, Raoul Byes, MD [facility] [phone] - A...  CLONAZEPAM  0.5 MG TABLET 05/26/2023 30 120 each Dohmeier, Raoul Byes, MD [facility] [phone] - A...  CLONAZEPAM  0.5 MG TABLET 04/28/2023 30 120 each Dohmeier, Raoul Byes, MD [facility] [phone] - A...    Routing to Dr. Salli Crawley since no work in provider was listed today

## 2024-05-02 NOTE — Progress Notes (Signed)
 Noted

## 2024-05-03 ENCOUNTER — Other Ambulatory Visit: Payer: Self-pay | Admitting: Cardiovascular Disease

## 2024-06-13 DIAGNOSIS — R972 Elevated prostate specific antigen [PSA]: Secondary | ICD-10-CM | POA: Diagnosis not present

## 2024-06-18 ENCOUNTER — Other Ambulatory Visit: Payer: Self-pay | Admitting: Adult Health

## 2024-06-20 DIAGNOSIS — N403 Nodular prostate with lower urinary tract symptoms: Secondary | ICD-10-CM | POA: Diagnosis not present

## 2024-06-20 DIAGNOSIS — R350 Frequency of micturition: Secondary | ICD-10-CM | POA: Diagnosis not present

## 2024-06-20 DIAGNOSIS — R351 Nocturia: Secondary | ICD-10-CM | POA: Diagnosis not present

## 2024-06-22 ENCOUNTER — Ambulatory Visit (HOSPITAL_COMMUNITY)
Admission: RE | Admit: 2024-06-22 | Discharge: 2024-06-22 | Disposition: A | Discharge status: Home / Self Care | Payer: Medicare Other | Source: Ambulatory Visit | Attending: Vascular Surgery | Admitting: Vascular Surgery

## 2024-06-22 ENCOUNTER — Encounter: Payer: Self-pay | Admitting: Vascular Surgery

## 2024-06-22 ENCOUNTER — Ambulatory Visit: Payer: Medicare Other | Admitting: Vascular Surgery

## 2024-06-22 VITALS — BP 163/88 | HR 54 | Temp 97.6°F | Resp 18 | Ht 72.0 in | Wt 167.6 lb

## 2024-06-22 DIAGNOSIS — Z9889 Other specified postprocedural states: Secondary | ICD-10-CM | POA: Diagnosis not present

## 2024-06-22 DIAGNOSIS — Z8679 Personal history of other diseases of the circulatory system: Secondary | ICD-10-CM
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
## 2024-07-04 ENCOUNTER — Other Ambulatory Visit: Payer: Self-pay | Admitting: Adult Health

## 2024-07-06 ENCOUNTER — Ambulatory Visit (INDEPENDENT_AMBULATORY_CARE_PROVIDER_SITE_OTHER): Admitting: Cardiovascular Disease

## 2024-07-06 ENCOUNTER — Ambulatory Visit: Payer: Federal, State, Local not specified - PPO | Admitting: Adult Health

## 2024-07-06 ENCOUNTER — Encounter: Payer: Self-pay | Admitting: Cardiovascular Disease

## 2024-07-06 ENCOUNTER — Ambulatory Visit
Admission: RE | Admit: 2024-07-06 | Discharge: 2024-07-06 | Disposition: A | Discharge status: Home / Self Care | Source: Ambulatory Visit | Attending: Cardiovascular Disease | Admitting: Cardiovascular Disease

## 2024-07-06 VITALS — BP 141/90 | HR 63 | Ht 75.0 in | Wt 167.0 lb

## 2024-07-06 DIAGNOSIS — E782 Mixed hyperlipidemia: Secondary | ICD-10-CM | POA: Insufficient documentation

## 2024-07-06 DIAGNOSIS — Z8679 Personal history of other diseases of the circulatory system: Secondary | ICD-10-CM | POA: Insufficient documentation

## 2024-07-06 DIAGNOSIS — Z951 Presence of aortocoronary bypass graft: Secondary | ICD-10-CM | POA: Diagnosis not present

## 2024-07-06 DIAGNOSIS — I2510 Atherosclerotic heart disease of native coronary artery without angina pectoris: Secondary | ICD-10-CM

## 2024-07-06 DIAGNOSIS — R059 Cough, unspecified: Secondary | ICD-10-CM | POA: Diagnosis not present

## 2024-07-06 DIAGNOSIS — R052 Subacute cough: Secondary | ICD-10-CM

## 2024-07-06 DIAGNOSIS — I10 Essential (primary) hypertension: Secondary | ICD-10-CM | POA: Diagnosis not present

## 2024-07-06 DIAGNOSIS — Z9889 Other specified postprocedural states: Secondary | ICD-10-CM

## 2024-07-06 DIAGNOSIS — I5020 Unspecified systolic (congestive) heart failure: Secondary | ICD-10-CM | POA: Insufficient documentation

## 2024-07-06 LAB — CBC

## 2024-07-06 NOTE — Assessment & Plan Note (Signed)
 Controlled on losartan and carvedilol

## 2024-07-06 NOTE — Assessment & Plan Note (Signed)
 Doing well on antiplatelet therapy with aspirin  and clopidogrel  as well as a statin drug.  No anginal symptoms.

## 2024-07-06 NOTE — Patient Instructions (Signed)
 Lab Work: CBC, CMET, Lipids today If you have labs (blood work) drawn today and your tests are completely normal, you will receive your results only by: MyChart Message (if you have MyChart) OR A paper copy in the mail If you have any lab test that is abnormal or we need to change your treatment, we will call you to review the results.  Testing/Procedures: Chest X-ray A chest x-ray takes a picture of the organs and structures inside the chest, including the heart, lungs, and blood vessels. This test can show several things, including, whether the heart is enlarges; whether fluid is building up in the lungs; and whether pacemaker / defibrillator leads are still in place.  Follow-Up: At [facility], you and your health needs are our priority.  As part of our continuing mission to provide you with exceptional heart care, our providers are all part of one team.  This team includes your primary Cardiologist (physician) and Advanced Practice Providers or APPs (Physician Assistants and Nurse Practitioners) who all work together to provide you with the care you need, when you need it.  Your next appointment:   6 month(s)  Provider:   Glendia Ferrier, PA-C      Then, Ozell Fell, MD will plan to see you again in 1 year(s).

## 2024-07-06 NOTE — Assessment & Plan Note (Signed)
 Most recent echo reviewed with LVEF 45 to 50%, normal RV function, grade 1 diastolic dysfunction, and no significant valvular disease.  He will continue on losartan  and carvedilol  at current doses.  No congestive symptoms and no evidence of volume overload on exam.  Will check labs today.

## 2024-07-06 NOTE — Progress Notes (Signed)
 Cardiology Office Note:    Date:  07/06/2024   ID:  [name], DOB 01/04/1951, MRN [id number]  PCP:  de [place], Raymond J, MD   [facility] Providers Cardiologist:  Ozell Fell, MD Cardiology APP:  Lelon Glendia ONEIDA DEVONNA     Referring MD: de [place], Quintin PARAS, MD   Chief Complaint  Patient presents with   Coronary Artery Disease    History of Present Illness:    [name] is a 73 y.o. male with a hx of:  Coronary artery disease  S/p CABG in 2002 Cardiac catheterization in 2012: 3/5 grafts patent Myoview  12/21: Large inferior and medium anterior scar, minimal peri-infarct ischemia, EF 33; high risk S/p 3.5 x 15 mm DES to LAD 12/2020; L-LAD atretic, S-RCA ok/PDA limb 100; S-OM 100, S-RI ok Myoview  06/05/22: inf infarct, dist ant infarct, no ischemia, EF 41 HFmrEF (heart failure with mildly reduced ejection fraction)   TTE 11/14/20: Septal and anterior HK consistent with prior LAD infarct, EF 40-45, GR 1 DD, normal RVSF, trivial MR, mild dilation of ascending aorta measuring 40 mm  TTE 05/20/21: EF 45-50 TTE 11/30/22: EF 55, no RWMA, NL RVSF, mild to mod LAE, mild MR< trivial AI, AV sclerosis, Ao root 41 mm, Asc Ao 40 mm Abdominal aortic aneurysm  S/p EVAR 01/2023 (Dr. Silver) Mildly dilated ascending aorta OSA  Hx of syncope in 2019 EF normal on echocardiogram // Event monitor 10/19: no sig arrhythmias  Hypertension  Hyperlipidemia  Peripheral arterial disease  AAA US  in 2019 w >50% L CIA stenosis; ABIs 6/19: normal    The patient's last echocardiogram in December 2024 showed LVEF 45 to 50% with moderate asymmetric LVH of the basal septum, grade 1 diastolic dysfunction, normal RV function, and no significant valvular disease.  Last lipids from 1 year ago showed a cholesterol 130, HDL 47, and LDL 66.  The patient is here alone today. He's been working hard to maintain his weight as he has had trouble with weight loss over recent years. He's under a lot of stress  caring for his wife 24/7 as she has myotonic dystrophy.  He denies any chest pain, chest pressure, or shortness of breath.  He denies leg swelling, orthopnea, or PND.  He complains of a cough that has been present now for 2 to 3 months.  No fevers or chills.  Current Medications: Current Meds  Medication Sig   acetaminophen  (TYLENOL ) 650 MG CR tablet Take 1,300 mg by mouth in the morning, at noon, and at bedtime.   alfuzosin  (UROXATRAL ) 10 MG 24 hr tablet Take 10 mg by mouth daily.   aspirin  EC 81 MG tablet Take 1 tablet (81 mg total) by mouth daily.   atorvastatin  (LIPITOR) 40 MG tablet TAKE 1 TABLET BY MOUTH EVERY DAY   carvedilol  (COREG ) 3.125 MG tablet TAKE 1 TABLET BY MOUTH TWICE A DAY   clonazePAM  (KLONOPIN ) 1 MG tablet TAKE 1 TABLET (1 MG TOTAL) BY MOUTH 2 (TWO) TIMES DAILY AS NEEDED FOR ANXIETY (TWICE AT BEDTIME).   clopidogrel  (PLAVIX ) 75 MG tablet TAKE 1 TABLET BY MOUTH EVERY DAY WITH BREAKFAST   Coenzyme Q10 (CO Q 10) 100 MG CAPS Take 100 mg by mouth daily.   Cyanocobalamin  (VITAMIN B-12) 1000 MCG SUBL Place 1 tablet (1,000 mcg total) under the tongue daily.   gabapentin  (NEURONTIN ) 100 MG capsule TAKE 2 CAPSULES (200 MG) BY MOUTH SCHEDULED AT BEDTIME, MAY TAKE AN ADDITIONAL 2 CAPSULES (200 MG) BY  MOUTH UPON WAKING IN THE MIDDLE OF NIGHT IF NEEDED.   hydrocortisone  (ANUSOL -HC) 2.5 % rectal cream Place 1 Application rectally 2 (two) times daily.   hydrocortisone  (ANUSOL -HC) 25 MG suppository PLACE 1 SUPPOSITORY RECTALLY 2 TIMES DAILY. (Patient taking differently: as needed for hemorrhoids or anal itching.)   losartan  (COZAAR ) 50 MG tablet Take 1 tablet (50 mg total) by mouth daily.   Magnesium  Oxide -Mg Supplement 420 (252 Mg) MG TABS Take 420 mg by mouth at bedtime. Chelated Magnesium    mirtazapine  (REMERON ) 45 MG tablet TAKE 1 TABLET BY MOUTH AT BEDTIME.   Multiple Vitamin (MULTIVITAMIN) tablet Take 1 tablet by mouth daily. Mega Men   nitroGLYCERIN  (NITROSTAT ) 0.4 MG SL tablet Place  1 tablet (0.4 mg total) under the tongue every 5 (five) minutes as needed. for chest pain   ofloxacin  (FLOXIN  OTIC) 0.3 % OTIC solution Place 10 drops into both ears daily. (Patient taking differently: Place 10 drops into both ears daily as needed (ear infeaction).)   Polyethylene Glycol 400 (BLINK TEARS) 0.25 % GEL Place 1 drop into both eyes daily as needed (clear out eyes).   tiZANidine  (ZANAFLEX ) 4 MG tablet TAKE 1 TABLET BY MOUTH 2 TIMES DAILY AS NEEDED FOR MUSCLE SPASMS AND 2.5 TABS AT NIGHT   TURMERIC PO Take 1,000 mg by mouth daily.   valACYclovir  (VALTREX ) 500 MG tablet TAKE 1 TABLET BY MOUTH TWICE A DAY     Allergies:   Dilaudid  [hydromorphone  hcl], Penicillins, Xifaxan [rifaximin], Celexa [citalopram hydrobromide], Chlorhexidine , Chlorhexidine  gluconate, Doxycycline, and Isosorbide mononitrate   ROS:   Please see the history of present illness.    All other systems reviewed and are negative.  EKGs/Labs/Other Studies Reviewed:    The following studies were reviewed today: Cardiac Studies & Procedures   ______________________________________________________________________________________________ CARDIAC CATHETERIZATION  CARDIAC CATHETERIZATION 12/20/2020  Conclusion  Ost RCA to Prox RCA lesion is 100% stenosed. SVG to RCA/PDA present. First limb to distal RCA patent. Second part to PDA is occluded.  RPDA lesion is 95% stenosed. RPAV lesion is 95% stenosed. These are small vessels.  Prox Graft lesion between Dist RCA and RPDA is 100% stenosed.  Ramus lesion is 100% stenosed. SVG to ramus is patent.  1st Mrg lesion is 100% stenosed. SVG to OM1/OM2 is occluded.  Mid Cx lesion is 50% stenosed. Circumflex has recanalized since the 2011 cath and there is flow to the OM2 through the native circuflex.  Origin to Prox Graft lesion before 1st Mrg is 100% stenosed.  Prox LAD lesion is 99% stenosed. LIMA to LAD is atretic.  A drug-eluting stent was successfully placed using a  STENT RESOLUTE ONYX 3.5X15, postdilated to > 4 mm, optimized with IVUS.  Post intervention, there is a 0% residual stenosis.  There is moderate to severe left ventricular systolic dysfunction.  The left ventricular ejection fraction is 25-35% by visual estimate.  LV end diastolic pressure is normal.  There is no aortic valve stenosis.  Moderate sized calcified infrarenal AAA.  Short left main.  Longer catheter needed for the circumflex.  Consider clopidogrel  monotherapy after 6 months of DAPT.  Continue aggressive secondary prevention.  Findings Coronary Findings Diagnostic  Dominance: Right  Left Anterior Descending There is mild diffuse disease throughout the vessel. Prox LAD lesion is 99% stenosed. The lesion is irregular and ulcerative.  Ramus Intermedius Ramus lesion is 100% stenosed.  Left Circumflex There is mild diffuse disease throughout the vessel. The vessel is ectatic. Ectasia in the mid vessel.  50%  Lesion in small part of distal circumflex before the OM2. Mid Cx lesion is 50% stenosed.  First Obtuse Marginal Branch 1st Mrg lesion is 100% stenosed.  Right Coronary Artery Ost RCA to Prox RCA lesion is 99% stenosed.  Right Posterior Descending Artery RPDA lesion is 95% stenosed.  Right Posterior Atrioventricular Artery RPAV lesion is 95% stenosed.  Sequential Graft To Dist RCA, RPDA Prox Graft lesion between Dist RCA and RPDA  is 100% stenosed.  Graft To Ramus  Sequential Graft To 1st Mrg, 2nd Mrg Origin to Prox Graft lesion before 1st Mrg  is 100% stenosed.  LIMA LIMA Graft To Mid LAD LIMA. The graft is atretic. Origin to Prox Graft lesion is 100% stenosed.  Intervention  Prox LAD lesion Stent CATH LAUNCHER 6FR EBU3.5 guide catheter was inserted. Lesion crossed with guidewire using a WIRE ASAHI PROWATER 180CM. Pre-stent angioplasty was performed using a BALLOON SAPPHIRE 3.0X15. A drug-eluting stent was successfully placed using a STENT  RESOLUTE ONYX 3.5X15. Stent strut is well apposed. Post-stent angioplasty was performed using a BALLOON SAPPHIRE Hyde 4.0X12. Optimized with IVUS.  Ectatic segment after the lesion was avoided with stent. Post-Intervention Lesion Assessment The intervention was successful. Pre-interventional TIMI flow is 3. Post-intervention TIMI flow is 3. No complications occurred at this lesion. Ultrasound (IVUS) was performed on the lesion post PCI. Stent well apposed. There is a 0% residual stenosis post intervention.   STRESS TESTS  MYOCARDIAL PERFUSION IMAGING 06/05/2022  Interpretation Summary   Findings are consistent with prior myocardial infarction. The study is intermediate risk.   No ST deviation was noted.   LV perfusion is abnormal. There is no evidence of ischemia. There is evidence of infarction. Defect 1: There is a large defect with severe reduction in uptake present in the apical to basal inferior location(s) that is fixed. There is abnormal wall motion in the defect area. Consistent with infarction.   Left ventricular function is abnormal. Nuclear stress EF: 41 %. The left ventricular ejection fraction is moderately decreased (30-44%). End diastolic cavity size is severely enlarged. End systolic cavity size is severely enlarged.   Prior study available for comparison from 12/03/2020.  Abnormal, intermediate risk stress nuclear study with large inferior infarct and small distal anterior infarct; no ischemia; gated ejection fraction 41% with hypokinesis of the inferior wall.  Severe left ventricular enlargement.  Study interpreted as intermediate risk due to reduced LV function.   ECHOCARDIOGRAM  ECHOCARDIOGRAM COMPLETE 11/19/2023  Narrative ECHOCARDIOGRAM REPORT    Patient Name:   [name]  Date of Exam: 11/19/2023 Medical Rec #:  [id number]     Height:       75.0 in Accession #:    [id number]    Weight:       150.8 lb Date of Birth:  February 22, 1951     BSA:          1.946 m Patient Age:     72 years      BP:           120/80 mmHg Patient Gender: M             HR:           64 bpm. Exam Location:  [address]  Procedure: 2D Echo, Cardiac Doppler, Color Doppler, 3D Echo and Strain Analysis  Indications:    I77.810 Dilation of aorta  History:        Patient has prior history of Echocardiogram examinations, most recent 11/30/2022. CHF, CAD, PAD,  Signs/Symptoms:Chest Pain and Fatigue; Risk Factors:Hypertension and Dyslipidemia. Hyperglycemia.  Sonographer:    Jon Hacker RCS Referring Phys: [id number] GLENDIA DASEN WEAVER  IMPRESSIONS   1. Left ventricular ejection fraction, by estimation, is 45 to 50%. The left ventricle has mildly decreased function. The left ventricle demonstrates regional wall motion abnormalities (see scoring diagram/findings for description). There is moderate asymmetric left ventricular hypertrophy of the basal-septal segment. Left ventricular diastolic parameters are consistent with Grade I diastolic dysfunction (impaired relaxation). The average left ventricular global longitudinal strain is -18.2 %. 2. Right ventricular systolic function is normal. The right ventricular size is normal. 3. The mitral valve is normal in structure. Trivial mitral valve regurgitation. No evidence of mitral stenosis. 4. The aortic valve is tricuspid. Aortic valve regurgitation is not visualized. Aortic valve sclerosis is present, with no evidence of aortic valve stenosis. 5. Aortic dilatation noted. There is dilatation of the ascending aorta, measuring 41 mm.  FINDINGS Left Ventricle: Left ventricular ejection fraction, by estimation, is 45 to 50%. The left ventricle has mildly decreased function. The left ventricle demonstrates regional wall motion abnormalities. The average left ventricular global longitudinal strain is -18.2 %. The left ventricular internal cavity size was normal in size. There is moderate asymmetric left ventricular hypertrophy of the basal-septal segment.  Left ventricular diastolic parameters are consistent with Grade I diastolic dysfunction (impaired relaxation).   LV Wall Scoring: The mid and distal anterior septum and mid inferoseptal segment are hypokinetic. The entire anterior wall, entire lateral wall, entire inferior wall, basal anteroseptal segment, basal inferoseptal segment, and apex are normal.  Right Ventricle: The right ventricular size is normal. No increase in right ventricular wall thickness. Right ventricular systolic function is normal.  Left Atrium: Left atrial size was normal in size.  Right Atrium: Right atrial size was normal in size.  Pericardium: There is no evidence of pericardial effusion.  Mitral Valve: The mitral valve is normal in structure. Trivial mitral valve regurgitation. No evidence of mitral valve stenosis.  Tricuspid Valve: The tricuspid valve is normal in structure. Tricuspid valve regurgitation is trivial.  Aortic Valve: The aortic valve is tricuspid. Aortic valve regurgitation is not visualized. Aortic valve sclerosis is present, with no evidence of aortic valve stenosis.  Pulmonic Valve: The pulmonic valve was not well visualized. Pulmonic valve regurgitation is trivial.  Aorta: The aortic root is normal in size and structure and aortic dilatation noted. There is dilatation of the ascending aorta, measuring 41 mm.  IAS/Shunts: The interatrial septum was not well visualized.   LEFT VENTRICLE PLAX 2D LVIDd:         4.20 cm   Diastology LVIDs:         2.90 cm   LV e' medial:    7.72 cm/s LV PW:         0.80 cm   LV E/e' medial:  8.1 LV IVS:        1.00 cm   LV e' lateral:   9.68 cm/s LVOT diam:     2.30 cm   LV E/e' lateral: 6.4 LV SV:         76 LV SV Index:   39        2D Longitudinal Strain LVOT Area:     4.15 cm  2D Strain GLS Avg:     -18.2 %  3D Volume EF: 3D EF:        54 % LV EDV:       179 ml LV ESV:  82 ml LV SV:        97 ml  RIGHT VENTRICLE RV Basal diam:  2.80  cm RV S prime:     11.60 cm/s TAPSE (M-mode): 1.4 cm RVSP:           20.8 mmHg  LEFT ATRIUM             Index        RIGHT ATRIUM           Index LA diam:        4.50 cm 2.31 cm/m   RA Pressure: 3.00 mmHg LA Vol (A2C):   55.2 ml 28.36 ml/m  RA Area:     15.50 cm LA Vol (A4C):   64.6 ml 33.19 ml/m  RA Volume:   37.00 ml  19.01 ml/m LA Biplane Vol: 60.4 ml 31.03 ml/m AORTIC VALVE LVOT Vmax:   88.60 cm/s LVOT Vmean:  52.800 cm/s LVOT VTI:    0.183 m  AORTA Ao Root diam: 3.80 cm Ao Asc diam:  4.10 cm  MITRAL VALVE                TRICUSPID VALVE MV Area (PHT): 1.87 cm     TR Peak grad:   17.8 mmHg MV Decel Time: 405 msec     TR Vmax:        211.00 cm/s MV E velocity: 62.40 cm/s   Estimated RAP:  3.00 mmHg MV A velocity: 102.00 cm/s  RVSP:           20.8 mmHg MV E/A ratio:  0.61 SHUNTS Systemic VTI:  0.18 m Systemic Diam: 2.30 cm  Lonni Nanas MD Electronically signed by Lonni Nanas MD Signature Date/Time: 11/19/2023/5:09:39 PM    Final    MONITORS  CARDIAC EVENT MONITOR 09/28/2018  Narrative  The patient was enrolled for 30 days. 97% of the monitoring period yielded diagnostic tracings.  The predominant rhythm was sinus with an average rate of 66 bpm (range 44-137 bpm).  Isolated PAC's and PVC's were noted, as were brief atrial runs.  Two episodes of non-sustained ventricular tachycardia lasting up to 6 beats were observed.  There was no sustained arrhythmia or prolonged pause.  Predominantly sinus rhythm with isolated PAC's and PVC's as well as brief atrial runs and two episodes of NSVT (lasting up to 6 beats).       ______________________________________________________________________________________________      EKG:        Recent Labs: No results found for requested labs within last 365 days.  Recent Lipid Panel    Component Value Date/Time   CHOL 130 05/07/2023 1041   TRIG 87 05/07/2023 1041   HDL 47 05/07/2023 1041    CHOLHDL 2.8 05/07/2023 1041   CHOLHDL 2 12/03/2021 0913   VLDL 17.8 12/03/2021 0913   LDLCALC 66 05/07/2023 1041   LDLDIRECT 84.4 03/02/2011 1050          Physical Exam:    VS:  BP (!) 141/90 (BP Location: Left Arm)   Pulse 63   Ht 6' 3 (1.905 m)   Wt 167 lb (75.8 kg)   SpO2 97%   BMI 20.87 kg/m     Wt Readings from Last 3 Encounters:  07/06/24 167 lb (75.8 kg)  06/22/24 167 lb 9.6 oz (76 kg)  04/18/24 162 lb 2 oz (73.5 kg)     GEN:  Well nourished, well developed in no acute distress HEENT: Normal NECK: No JVD; No carotid bruits LYMPHATICS:  No lymphadenopathy CARDIAC: RRR, no murmurs, rubs, gallops RESPIRATORY:  Clear to auscultation without rales, wheezing or rhonchi  ABDOMEN: Soft, non-tender, non-distended MUSCULOSKELETAL:  No edema; No deformity  SKIN: Warm and dry NEUROLOGIC:  Alert and oriented x 3 PSYCHIATRIC:  Normal affect   Assessment & Plan HFrEF (heart failure with reduced ejection fraction) (HCC) Most recent echo reviewed with LVEF 45 to 50%, normal RV function, grade 1 diastolic dysfunction, and no significant valvular disease.  He will continue on losartan  and carvedilol  at current doses.  No congestive symptoms and no evidence of volume overload on exam.  Will check labs today. Coronary artery disease involving native coronary artery of native heart without angina pectoris Doing well on antiplatelet therapy with aspirin  and clopidogrel  as well as a statin drug.  No anginal symptoms. Essential hypertension Controlled on losartan  and carvedilol  Mixed hyperlipidemia Treated with atorvastatin .  Will check fasting lipids today.  Last LDL cholesterol 1 year ago was 66. Status post endovascular aneurysm repair (EVAR) Followed by vascular surgery.  On appropriate medical therapy as outlined above Subacute cough Check chest x-ray and labs today.  Lung exam clear.      Medication Adjustments/Labs and Tests Ordered: Current medicines are reviewed at  length with the patient today.  Concerns regarding medicines are outlined above.  Orders Placed This Encounter  Procedures   DG Chest 2 View   CBC   Comprehensive metabolic panel with GFR   Lipid panel   No orders of the defined types were placed in this encounter.   Patient Instructions  Lab Work: CBC, CMET, Lipids today If you have labs (blood work) drawn today and your tests are completely normal, you will receive your results only by: MyChart Message (if you have MyChart) OR A paper copy in the mail If you have any lab test that is abnormal or we need to change your treatment, we will call you to review the results.  Testing/Procedures: Chest X-ray A chest x-ray takes a picture of the organs and structures inside the chest, including the heart, lungs, and blood vessels. This test can show several things, including, whether the heart is enlarges; whether fluid is building up in the lungs; and whether pacemaker / defibrillator leads are still in place.  Follow-Up: At [facility], you and your health needs are our priority.  As part of our continuing mission to provide you with exceptional heart care, our providers are all part of one team.  This team includes your primary Cardiologist (physician) and Advanced Practice Providers or APPs (Physician Assistants and Nurse Practitioners) who all work together to provide you with the care you need, when you need it.  Your next appointment:   6 month(s)  Provider:   Glendia Ferrier, PA-C      Then, Ozell Fell, MD will plan to see you again in 1 year(s).       Signed, Ozell Fell, MD  07/06/2024 5:00 PM    [facility]

## 2024-07-06 NOTE — Assessment & Plan Note (Signed)
 Treated with atorvastatin .  Will check fasting lipids today.  Last LDL cholesterol 1 year ago was 66.

## 2024-07-07 ENCOUNTER — Ambulatory Visit: Payer: Self-pay

## 2024-07-07 LAB — COMPREHENSIVE METABOLIC PANEL WITH GFR
ALT: 26 IU/L (ref 0–44)
AST: 24 IU/L (ref 0–40)
Albumin: 4.7 g/dL (ref 3.8–4.8)
Alkaline Phosphatase: 96 IU/L (ref 44–121)
BUN/Creatinine Ratio: 12 (ref 10–24)
BUN: 10 mg/dL (ref 8–27)
Bilirubin Total: 0.4 mg/dL (ref 0.0–1.2)
CO2: 20 mmol/L (ref 20–29)
Calcium: 9.9 mg/dL (ref 8.6–10.2)
Chloride: 93 mmol/L — ABNORMAL LOW (ref 96–106)
Creatinine, Ser: 0.85 mg/dL (ref 0.76–1.27)
Globulin, Total: 2.5 g/dL (ref 1.5–4.5)
Glucose: 94 mg/dL (ref 70–99)
Potassium: 4.4 mmol/L (ref 3.5–5.2)
Sodium: 132 mmol/L — ABNORMAL LOW (ref 134–144)
Total Protein: 7.2 g/dL (ref 6.0–8.5)
eGFR: 92 mL/min/1.73 (ref >59)

## 2024-07-07 LAB — LIPID PANEL
Chol/HDL Ratio: 2 ratio (ref 0.0–5.0)
Cholesterol, Total: 121 mg/dL (ref 100–199)
HDL: 60 mg/dL (ref >39)
LDL Chol Calc (NIH): 46 mg/dL (ref 0–99)
Triglycerides: 73 mg/dL (ref 0–149)
VLDL Cholesterol Cal: 15 mg/dL (ref 5–40)

## 2024-07-07 LAB — CBC
Hematocrit: 37 — AB (ref 37.5–51.0)
Hemoglobin: 12.5 g/dL — AB (ref 13.0–17.7)
MCH: 32.5 pg (ref 26.6–33.0)
MCHC: 33.8 g/dL (ref 31.5–35.7)
MCV: 96 fL (ref 79–97)
Platelets: 183 x10E3/uL (ref 150–450)
RBC: 3.85 x10E6/uL — AB (ref 4.14–5.80)
RDW: 12.4 (ref 11.6–15.4)
WBC: 3.6 x10E3/uL (ref 3.4–10.8)

## 2024-07-10 ENCOUNTER — Other Ambulatory Visit: Payer: Self-pay | Admitting: Adult Health

## 2024-07-14 ENCOUNTER — Other Ambulatory Visit: Payer: Self-pay | Admitting: Adult Health

## 2024-07-17 ENCOUNTER — Encounter: Payer: Self-pay | Admitting: *Deleted

## 2024-07-18 ENCOUNTER — Ambulatory Visit (INDEPENDENT_AMBULATORY_CARE_PROVIDER_SITE_OTHER): Payer: Federal, State, Local not specified - PPO | Admitting: Adult Health

## 2024-07-18 ENCOUNTER — Encounter: Payer: Self-pay | Admitting: Adult Health

## 2024-07-18 VITALS — BP 135/95 | HR 75 | Ht 75.0 in | Wt 168.2 lb

## 2024-07-18 DIAGNOSIS — G4733 Obstructive sleep apnea (adult) (pediatric): Secondary | ICD-10-CM | POA: Diagnosis not present

## 2024-07-18 DIAGNOSIS — G4730 Sleep apnea, unspecified: Secondary | ICD-10-CM | POA: Diagnosis not present

## 2024-07-18 DIAGNOSIS — G4700 Insomnia, unspecified: Secondary | ICD-10-CM | POA: Diagnosis not present
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
## 2024-07-18 NOTE — Patient Instructions (Addendum)
 Continue using CPAP nightly and greater than 4 hours each night If your symptoms worsen or you develop new symptoms please let us know.

## 2024-08-22 ENCOUNTER — Ambulatory Visit (INDEPENDENT_AMBULATORY_CARE_PROVIDER_SITE_OTHER): Admitting: Internal Medicine

## 2024-08-22 ENCOUNTER — Encounter: Payer: Self-pay | Admitting: Internal Medicine

## 2024-08-22 VITALS — BP 132/78 | HR 77 | Ht 74.0 in | Wt 165.1 lb

## 2024-08-22 DIAGNOSIS — K642 Third degree hemorrhoids: Secondary | ICD-10-CM

## 2024-08-22 DIAGNOSIS — R151 Fecal smearing: Secondary | ICD-10-CM

## 2024-08-22 NOTE — Progress Notes (Signed)
 [name] is a 73 year old male patient of Dr. Abran he is known to me who returns for follow-up of recurrent prolapsing internal hemorrhoids as well as fecal smearing.  He is here alone today.  He underwent banding for internal hemorrhoids x 3 in September 2024, November 2024 and January 2025 This was performed without interrupting Plavix  and he did well. Symptoms improved for over 2 months but prolapse has recurred.  There is been no further bleeding.  He also has continued fecal smearing.  He has not tried fiber supplement.  He has been experiencing continuous fecal seepage and smearing for several months, characterized by liquid consistency without associated bleeding. He has bowel movements one to two times daily with formed stools, except when consuming Raisin Bran, which results in diarrhea.  He follows a routine of not sitting on the toilet for more than five minutes and avoids straining during bowel movements. No bleeding has been noticed since the previous banding procedure.   BP 132/78 (BP Location: Left Arm, Patient Position: Sitting, Cuff Size: Normal)   Pulse 77   Ht 6' 2 (1.88 m)   Wt 165 lb 2 oz (74.9 kg)   BMI 21.20 kg/m  Gen: awake, alert, NAD HEENT: anicteric  Rectal: No external rashes or lesions, prolapsed right posterior internal hemorrhoid which is reducible grade 3, small hemorrhoid in the left lateral position Ext: no c/c/e Neuro: nonfocal  Internal hemorrhoid with prolapse --we did discuss repeat banding and he clearly has a target that should respond.  We discussed the slightly high risk of post rubber band ligation bleeding when Plavix  is not interrupted.  We reviewed the risk, benefits and alternatives and he wishes to proceed   PROCEDURE NOTE:  The patient presents with symptomatic grade 3 internal hemorrhoids, requesting rubber band ligation of his hemorrhoidal disease.  All risks, benefits and alternative forms of therapy were described and informed  consent was obtained.   The anorectum was pre-medicated with 0.125% nitroglycerin  ointment The decision was made to band the RP and LL internal hemorrhoid, and the [facility] O'Regan System was used to perform band ligation without complication.   Digital anorectal examination was then performed to assure proper positioning of the band, and to adjust the banded tissue as required.  The patient was discharged home without pain or other issues.  Dietary and behavioral recommendations were given and along with follow-up instructions.     The patient will return as scheduled for follow-up and possible additional banding as required. No complications were encountered and the patient tolerated the procedure well.   2.  Fecal smearing --adding fiber should help this tremendously --Begin Metamucil 2 heaping tablespoons daily

## 2024-08-22 NOTE — Patient Instructions (Signed)

## 2024-10-10 ENCOUNTER — Encounter: Admitting: Internal Medicine

## 2024-10-13 ENCOUNTER — Encounter: Payer: Self-pay | Admitting: Neurology

## 2024-10-13 ENCOUNTER — Other Ambulatory Visit: Payer: Self-pay | Admitting: Diagnostic Neuroimaging

## 2024-10-13 NOTE — Telephone Encounter (Signed)
 Pt called to request medication refill , Pt states he is out of medication  clonazePAM  (KLONOPIN ) 1 MG tablet   [id number] - [place], [place] - [address] (Ph: [phone])

## 2024-11-07 ENCOUNTER — Encounter: Payer: Self-pay | Admitting: Physician Assistant

## 2024-11-23 ENCOUNTER — Ambulatory Visit (HOSPITAL_COMMUNITY)
Admission: RE | Admit: 2024-11-23 | Discharge: 2024-11-23 | Disposition: A | Discharge status: Home / Self Care | Source: Ambulatory Visit | Attending: Physician Assistant | Admitting: Physician Assistant

## 2024-11-23 DIAGNOSIS — I77810 Thoracic aortic ectasia: Secondary | ICD-10-CM | POA: Diagnosis not present

## 2024-11-23 DIAGNOSIS — I10 Essential (primary) hypertension: Secondary | ICD-10-CM | POA: Diagnosis not present

## 2024-11-23 DIAGNOSIS — I5020 Unspecified systolic (congestive) heart failure: Secondary | ICD-10-CM | POA: Insufficient documentation

## 2024-11-23 LAB — ECHOCARDIOGRAM COMPLETE
Area-P 1/2: 4.54 cm2
S' Lateral: 3.6 cm

## 2024-11-24 ENCOUNTER — Ambulatory Visit: Payer: Self-pay | Admitting: Physician Assistant

## 2024-11-24 DIAGNOSIS — I5020 Unspecified systolic (congestive) heart failure: Secondary | ICD-10-CM

## 2024-11-24 DIAGNOSIS — I77810 Thoracic aortic ectasia: Secondary | ICD-10-CM

## 2024-12-07 ENCOUNTER — Emergency Department (HOSPITAL_BASED_OUTPATIENT_CLINIC_OR_DEPARTMENT_OTHER)

## 2024-12-07 ENCOUNTER — Emergency Department (HOSPITAL_BASED_OUTPATIENT_CLINIC_OR_DEPARTMENT_OTHER)
Admission: EM | Admit: 2024-12-07 | Discharge: 2024-12-07 | Disposition: A | Discharge status: Home / Self Care | Attending: Emergency Medicine | Admitting: Emergency Medicine

## 2024-12-07 ENCOUNTER — Encounter (HOSPITAL_BASED_OUTPATIENT_CLINIC_OR_DEPARTMENT_OTHER): Payer: Self-pay | Admitting: Emergency Medicine

## 2024-12-07 ENCOUNTER — Other Ambulatory Visit: Payer: Self-pay

## 2024-12-07 DIAGNOSIS — I110 Hypertensive heart disease with heart failure: Secondary | ICD-10-CM | POA: Diagnosis not present

## 2024-12-07 DIAGNOSIS — Z7901 Long term (current) use of anticoagulants: Secondary | ICD-10-CM | POA: Insufficient documentation

## 2024-12-07 DIAGNOSIS — J101 Influenza due to other identified influenza virus with other respiratory manifestations: Secondary | ICD-10-CM | POA: Insufficient documentation

## 2024-12-07 DIAGNOSIS — Z7982 Long term (current) use of aspirin: Secondary | ICD-10-CM | POA: Diagnosis not present

## 2024-12-07 DIAGNOSIS — I509 Heart failure, unspecified: Secondary | ICD-10-CM | POA: Insufficient documentation

## 2024-12-07 DIAGNOSIS — R059 Cough, unspecified: Secondary | ICD-10-CM | POA: Diagnosis present

## 2024-12-07 DIAGNOSIS — Z79899 Other long term (current) drug therapy: Secondary | ICD-10-CM | POA: Diagnosis not present

## 2024-12-07 LAB — RESP PANEL BY RT-PCR (RSV, FLU A&B, COVID)  RVPGX2
Influenza A by PCR: POSITIVE — AB
Influenza B by PCR: NEGATIVE
Resp Syncytial Virus by PCR: NEGATIVE
SARS Coronavirus 2 by RT PCR: NEGATIVE

## 2024-12-07 LAB — GROUP A STREP BY PCR: Group A Strep by PCR: NOT DETECTED

## 2024-12-07 MED ORDER — ACETAMINOPHEN 325 MG PO TABS
650.0000 mg | ORAL_TABLET | Freq: Once | ORAL | Status: AC
  Administered 2024-12-07: 650 mg via ORAL
  Filled 2024-12-07: qty 2

## 2024-12-07 MED ORDER — BENZONATATE 100 MG PO CAPS: 100.0000 mg | ORAL_CAPSULE | Freq: Two times a day (BID) | ORAL | 0 refills | Status: AC | PRN

## 2024-12-07 MED ORDER — BENZONATATE 100 MG PO CAPS
100.0000 mg | ORAL_CAPSULE | Freq: Once | ORAL | Status: AC
  Administered 2024-12-07: 100 mg via ORAL
  Filled 2024-12-07: qty 1

## 2024-12-07 MED ORDER — OSELTAMIVIR PHOSPHATE 75 MG PO CAPS: 75.0000 mg | ORAL_CAPSULE | Freq: Two times a day (BID) | ORAL | 0 refills | Status: AC

## 2024-12-07 NOTE — ED Provider Notes (Signed)
 " [facility] Provider Note   CSN: [id number] Arrival date & time: 12/07/24  1135     Patient presents with: Cough   [name] is a 73 y.o. male who presents to the emergency department with a chief complaint of cough as well as chest congestion.  Patient denies known sick contacts.  Denies fever or chills.  Denies chest pain or shortness of breath.  Patient states that his cough has been productive of grayish colored sputum.  States that he has been experiencing symptoms for the last 3 days and the symptoms have also been accompanied by sore throat.  Denies nausea, vomiting, diarrhea, abdominal pain.  Past medical history significant for hypertension, hyperlipidemia, repaired abdominal aortic aneurysm, anemia, heart failure, MI, etc.    Cough      Prior to Admission medications  Medication Sig Start Date End Date Taking? Authorizing Provider  benzonatate  (TESSALON ) 100 MG capsule Take 1 capsule (100 mg total) by mouth 2 (two) times daily as needed for cough. 12/07/24  Yes Jannatul Wojdyla F, PA-C  oseltamivir  (TAMIFLU ) 75 MG capsule Take 1 capsule (75 mg total) by mouth every 12 (twelve) hours. 12/07/24  Yes Takeo Harts F, PA-C  acetaminophen  (TYLENOL ) 650 MG CR tablet Take 1,300 mg by mouth in the morning, at noon, and at bedtime.    [provider]  alfuzosin  (UROXATRAL ) 10 MG 24 hr tablet Take 10 mg by mouth daily. 03/16/15   [provider]  aspirin  EC 81 MG tablet Take 1 tablet (81 mg total) by mouth daily. 05/24/18   Lelon Hamilton T, PA-C  atorvastatin  (LIPITOR) 40 MG tablet TAKE 1 TABLET BY MOUTH EVERY DAY 07/08/23   Wonda Sharper, MD  carvedilol  (COREG ) 3.125 MG tablet TAKE 1 TABLET BY MOUTH TWICE A DAY 05/03/24   Wonda Sharper, MD  clonazePAM  (KLONOPIN ) 1 MG tablet TAKE 1 TABLET (1 MG TOTAL) BY MOUTH 2 (TWO) TIMES DAILY AS NEEDED FOR ANXIETY (TWICE AT BEDTIME). 10/13/24   Penumalli, Vikram R, MD  clopidogrel   (PLAVIX ) 75 MG tablet TAKE 1 TABLET BY MOUTH EVERY DAY WITH BREAKFAST 01/19/24   Wonda Sharper, MD  Coenzyme Q10 (CO Q 10) 100 MG CAPS Take 100 mg by mouth daily.    [provider]  Cyanocobalamin  (VITAMIN B-12) 1000 MCG SUBL Place 1 tablet (1,000 mcg total) under the tongue daily. 02/12/17   Antonio Cyndee Rockers R, DO  gabapentin  (NEURONTIN ) 100 MG capsule TAKE 2 CAPSULES (200 MG) BY MOUTH SCHEDULED AT BEDTIME, MAY TAKE AN ADDITIONAL 2 CAPSULES (200 MG) BY MOUTH UPON WAKING IN THE MIDDLE OF NIGHT IF NEEDED. 06/19/24   Millikan, Megan, NP  hydrocortisone  (ANUSOL -HC) 2.5 % rectal cream Place 1 Application rectally 2 (two) times daily. 04/18/24   McMichael, Nestor HERO, PA-C  hydrocortisone  (ANUSOL -HC) 25 MG suppository PLACE 1 SUPPOSITORY RECTALLY 2 TIMES DAILY. 11/24/21   Antonio Cyndee Rockers JONELLE, DO  losartan  (COZAAR ) 50 MG tablet Take 1 tablet (50 mg total) by mouth daily. 01/20/24   Cooper, Michael, MD  Magnesium  Oxide -Mg Supplement 420 (252 Mg) MG TABS Take 420 mg by mouth at bedtime. Chelated Magnesium     [provider]  mirtazapine  (REMERON ) 45 MG tablet TAKE 1 TABLET BY MOUTH EVERYDAY AT BEDTIME 07/18/24   Millikan, Megan, NP  Multiple Vitamin (MULTIVITAMIN) tablet Take 1 tablet by mouth daily. [employer]    [provider]  nitroGLYCERIN  (NITROSTAT ) 0.4 MG SL tablet Place 1 tablet (0.4  mg total) under the tongue every 5 (five) minutes as needed. for chest pain 10/08/21   Bhagat, [place], PA  ofloxacin  (FLOXIN  OTIC) 0.3 % OTIC solution Place 10 drops into both ears daily. 03/09/19   Antonio Cyndee Jamee JONELLE, DO  Polyethylene Glycol 400 (BLINK TEARS) 0.25 % GEL Place 1 drop into both eyes daily as needed (clear out eyes).    [provider]  tiZANidine  (ZANAFLEX ) 4 MG tablet TAKE 1 TABLET BY MOUTH 2 TIMES DAILY AS NEEDED FOR MUSCLE SPASMS AND 2.5 TABS AT NIGHT 07/10/24   Millikan, Megan, NP  TURMERIC PO Take 1,000 mg by mouth daily.    [provider]  valACYclovir   (VALTREX ) 500 MG tablet TAKE 1 TABLET BY MOUTH TWICE A DAY 11/29/23   [place], Quintin PARAS, MD    Allergies: Dilaudid  [hydromorphone  hcl], Penicillins, Xifaxan [rifaximin], Celexa [citalopram hydrobromide], Chlorhexidine , Chlorhexidine  gluconate, Doxycycline, and Isosorbide mononitrate    Review of Systems  Respiratory:  Positive for cough.     Updated Vital Signs BP (!) 153/97 (BP Location: Right Arm)   Pulse 79   Temp 98.4 F (36.9 C)   Resp 20   Ht 6' 2 (1.88 m)   Wt 72.6 kg   SpO2 98%   BMI 20.54 kg/m   Physical Exam Vitals and nursing note reviewed.  Constitutional:      General: He is awake. He is not in acute distress.    Appearance: Normal appearance. He is not ill-appearing, toxic-appearing or diaphoretic.  HENT:     Head: Normocephalic and atraumatic.     Right Ear: Tympanic membrane, ear canal and external ear normal. There is no impacted cerumen.     Left Ear: Tympanic membrane, ear canal and external ear normal. There is no impacted cerumen.     Nose: Congestion present.     Mouth/Throat:     Mouth: Mucous membranes are moist.     Pharynx: No oropharyngeal exudate or posterior oropharyngeal erythema.  Eyes:     General: No scleral icterus.       Right eye: No discharge.        Left eye: No discharge.  Neck:     Comments: No stiff neck Cardiovascular:     Rate and Rhythm: Normal rate and regular rhythm.  Pulmonary:     Effort: Pulmonary effort is normal. No respiratory distress.     Breath sounds: Normal breath sounds. No stridor. No wheezing, rhonchi or rales.  Musculoskeletal:        General: Normal range of motion.     Cervical back: Normal range of motion. No rigidity.     Right lower leg: No edema.     Left lower leg: No edema.     Comments: Grossly normal range of motion of all 4 extremities, patient ambulatory without assistance  Skin:    General: Skin is warm.     Capillary Refill: Capillary refill takes less than 2 seconds.     Comments: No  obvious rashes or lesions  Neurological:     General: No focal deficit present.     Mental Status: He is alert and oriented to person, place, and time.  Psychiatric:        Mood and Affect: Mood normal.        Behavior: Behavior normal. Behavior is cooperative.     (all labs ordered are listed, but only abnormal results are displayed) Labs Reviewed  RESP PANEL BY RT-PCR (RSV, FLU A&B, COVID)  RVPGX2 - Abnormal; Notable for the following components:      Result Value   Influenza A by PCR POSITIVE (*)    All other components within normal limits  GROUP A STREP BY PCR    EKG: None  Radiology: DG Chest 2 View Result Date: 12/07/2024 CLINICAL DATA:  Cough and shortness of breath. EXAM: CHEST - 2 VIEW COMPARISON:  July 06, 2024 FINDINGS: Multiple sternal wires and vascular clips are seen. The heart size and mediastinal contours are within normal limits. Both lungs are clear. Postoperative changes are present throughout the cervical spine. The visualized skeletal structures are unremarkable. IMPRESSION: 1. Evidence of prior median sternotomy/CABG. 2. No acute cardiopulmonary disease. Electronically Signed   By: Suzen Dials M.D.   On: 12/07/2024 14:12     Procedures   Medications Ordered in the ED  acetaminophen  (TYLENOL ) tablet 650 mg (650 mg Oral Given 12/07/24 1423)  benzonatate  (TESSALON ) capsule 100 mg (100 mg Oral Given 12/07/24 1423)                                    Medical Decision Making Amount and/or Complexity of Data Reviewed Radiology: ordered.  Risk OTC drugs. Prescription drug management.   Patient presents to the ED for concern of cough, URI-like symptoms this involves an extensive number of treatment options, and is a complaint that carries with it a high risk of complications and morbidity.  The differential diagnosis includes viral syndrome including COVID, flu, RSV, pneumonia, strep, etc.   Co morbidities that complicate the patient  evaluation  hypertension, hyperlipidemia, repaired abdominal aortic aneurysm, anemia, heart failure, MI   Lab Tests:  I Ordered, and personally interpreted labs.  The pertinent results include: Respiratory panel positive for influenza A, strep negative   Imaging Studies ordered:  I ordered imaging studies including chest x-ray I independently visualized and interpreted imaging which showed no acute cardiopulmonary process I agree with the radiologist interpretation   Medicines ordered and prescription drug management:  I ordered medication including tylenol  for pain, tessalon  perles for cough Reevaluation of the patient after these medicines showed that the patient stayed the same I have reviewed the patients home medicines and have made adjustments as needed   Test Considered:  none   Critical Interventions:  none   Problem List / ED Course:  73 year old male, vital signs stable, presents with a chief complaint of cough and congestion as well as some mild shortness of breath Significant co-morbidities present On physical exam patient very well-appearing, no obvious abnormality with auscultation of heart or lungs  However patient states he has experienced mild shortness of breath so will obtain CXR CXR reassuring Respiratory panel positive for influenza A  Patient educated on workup and findings Return precautions given  Patient discharged with script for tamiflu  due to significant co morbidities, at this time patient is well-appearing with stable vitals, clear lungs Most likely diagnosis at this time is influenza A    Reevaluation:  After the interventions noted above, I reevaluated the patient and found that they have :stayed the same   Social Determinants of Health:  none   Dispostion:  After consideration of the diagnostic results and the patients response to treatment, I feel that the patient would benefit from discharge and outpatient therapy as  described, follow-up with PCP, continued monitoring of symptoms.      Final diagnoses:  Influenza A  ED Discharge Orders          Ordered    oseltamivir  (TAMIFLU ) 75 MG capsule  Every 12 hours        12/07/24 1507    benzonatate  (TESSALON ) 100 MG capsule  2 times daily PRN        12/07/24 1507               Shatia Sindoni F, PA-C 12/07/24 2229  "

## 2024-12-07 NOTE — ED Triage Notes (Signed)
 Pt c/o cough, sore throat x 3 days.  Denies known fever, n/v/d.

## 2024-12-07 NOTE — ED Notes (Signed)
 Patient transported to X-ray

## 2024-12-07 NOTE — Discharge Instructions (Signed)
 It was a pleasure taking care of you today.  Based on your history and physical exam as well as your imaging and lab tests I feel you are safe for discharge.  Today you tested positive for influenza A.  I think this is the most likely diagnosis for your symptoms.  Please continue supportive care at home including over-the-counter Tylenol  to help with your symptoms.  Please do not take more than 1000 mg in a single dose or more than 4000 mg in a single day.  You may also take Zyrtec or Claritin to help with allergy symptoms.  I also recommend Flonase which is a nasal decongestant nasal spray to help with symptoms.  I have also sent in a prescription for Tamiflu  to your pharmacy, you may pick it up and complete the entire course to help with your flu symptoms.  I have also sent in Tessalon  Perles which is a cough medication.  If you experience any of the following symptoms including but not limited to chest pain, shortness of breath, fracture a fever, weakness, or other concerning symptom please return to the emergency department or seek further medical care.  Please follow-up with your primary care provider in 1 week for a recheck.  Sooner if symptoms warrant.  If symptoms worsen recommend follow-up in 48 hours.

## 2024-12-07 NOTE — ED Notes (Signed)
 ED Provider at bedside.

## 2024-12-25 ENCOUNTER — Encounter (HOSPITAL_BASED_OUTPATIENT_CLINIC_OR_DEPARTMENT_OTHER): Payer: Self-pay | Admitting: Family Medicine

## 2024-12-29 ENCOUNTER — Encounter: Payer: Self-pay | Admitting: Neurology

## 2024-12-29 ENCOUNTER — Encounter: Payer: Self-pay | Admitting: Cardiovascular Disease

## 2025-01-01 ENCOUNTER — Other Ambulatory Visit: Payer: Self-pay | Admitting: Adult Health

## 2025-01-01 ENCOUNTER — Other Ambulatory Visit: Payer: Self-pay | Admitting: Cardiovascular Disease

## 2025-01-02 MED ORDER — CLOPIDOGREL BISULFATE 75 MG PO TABS: 75.0000 mg | ORAL_TABLET | Freq: Every day | ORAL | 0 refills | Status: AC

## 2025-01-02 MED ORDER — LOSARTAN POTASSIUM 50 MG PO TABS: 50.0000 mg | ORAL_TABLET | Freq: Every day | ORAL | 0 refills | Status: AC

## 2025-01-02 NOTE — Telephone Encounter (Signed)
 Last appointment was 07/18/24,  Next appointment is 01/18/25 Last refill was 10/06/2024. Office notes state to continue medication.

## 2025-01-03 NOTE — Telephone Encounter (Signed)
 Rx was sent

## 2025-01-18 ENCOUNTER — Ambulatory Visit: Admitting: Neurology

## 2025-01-18 ENCOUNTER — Encounter: Payer: Self-pay | Admitting: Neurology

## 2025-01-18 VITALS — BP 148/86 | HR 66 | Ht 74.0 in | Wt 164.2 lb

## 2025-01-18 DIAGNOSIS — G4733 Obstructive sleep apnea (adult) (pediatric): Secondary | ICD-10-CM

## 2025-01-18 DIAGNOSIS — G4700 Insomnia, unspecified: Secondary | ICD-10-CM

## 2025-01-18 DIAGNOSIS — G5139 Clonic hemifacial spasm, unspecified: Secondary | ICD-10-CM

## 2025-01-18 MED ORDER — CLONAZEPAM 1 MG PO TABS: 1.0000 mg | ORAL_TABLET | Freq: Every day | ORAL | 3 refills | Status: AC

## 2025-01-18 MED ORDER — GABAPENTIN 100 MG PO CAPS: ORAL_CAPSULE | ORAL | 3 refills | Status: AC

## 2025-01-18 MED ORDER — MAGNESIUM GLUCONATE 550 MG PO TABS: 550.0000 mg | ORAL_TABLET | Freq: Every day | ORAL | Status: AC

## 2025-01-18 NOTE — Patient Instructions (Signed)
 ASSESSMENT AND PLAN :    74 y.o. year old male  here with:   OSA on CPAP    Insomnia , chronic    Facial tic    Plan :   Lost weight while under stress, wife was hospitalized many times. Brittle health with myotonia.     CPAP compliance is good but he reports sleep is still hard to come by, however he likes Remeron  for sleep.   I refilled Remeron  and Klonopin , these are medications to be continued.  Gabapentin  refilled for facial tic.              Meds ordered this encounter  Medications   Magnesium  Gluconate 550 MG TABS      Sig: Take 1 tablet (550 mg total) by mouth daily.      Dispense:  90 tablet   clonazePAM  (KLONOPIN ) 1 MG tablet      Sig: Take 1 tablet (1 mg total) by mouth at bedtime.      Dispense:  90 tablet      Refill:  3      Not to exceed 4 additional fills before 10/18/2024   gabapentin  (NEURONTIN ) 100 MG capsule      Sig: TAKE 2 CAPSULES (200 MG) BY MOUTH SCHEDULED AT BEDTIME, MAY TAKE AN ADDITIONAL 2 CAPSULES (200 MG) BY MOUTH UPON WAKING IN THE MIDDLE OF NIGHT IF NEEDED.      Dispense:  360 capsule      Refill:  3    Follow up with NP in 6-7 months, virtual for refills .    New CPAP is due , the current one is from 2018-  He uses 7-15 cm water,  EPR  3, AHI 1.8/h. He bought his current machine online , (the last sleep test with us  was in 2019.  He was titrated to 7 cm water pressure and used a  pillow interface ).  He is CPAP dependent and feels he can't safely sleep without his machibe, he will let us  know when he is ready for an in lab- [facility].      After spending a total time of  30  minutes face to face and time for  history taking, physical and neurologic examination, review of laboratory studies,  personal review of imaging studies, reports and results of other testing and review of referral information / records as far as provided in visit,    Electronically signed by: Dedra Gores, MD 01/18/2025 2:13 PM   [facility] [employer] Board certified by The [employer] of [employer] and Diplomate of the [employer]. Board certified In Neurology through the ABPN, Fellow of the [employer].

## 2025-01-18 NOTE — Progress Notes (Signed)
 SABRA
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
## 2025-02-13 ENCOUNTER — Ambulatory Visit: Admitting: Physician Assistant

## 2025-04-10 ENCOUNTER — Encounter (HOSPITAL_BASED_OUTPATIENT_CLINIC_OR_DEPARTMENT_OTHER)

## 2025-09-17 ENCOUNTER — Ambulatory Visit: Admitting: Neurology
